# Patient Record
Sex: Female | Born: 1940 | Race: White | Hispanic: No | Marital: Single | Smoking: Former smoker
Health system: Southern US, Community
[De-identification: ages and names within clinical notes are randomized; demographics above are authoritative.]

## PROBLEM LIST (undated history)

## (undated) DIAGNOSIS — G44219 Episodic tension-type headache, not intractable: Secondary | ICD-10-CM

## (undated) DIAGNOSIS — G43909 Migraine, unspecified, not intractable, without status migrainosus: Secondary | ICD-10-CM

## (undated) DIAGNOSIS — E785 Hyperlipidemia, unspecified: Secondary | ICD-10-CM

## (undated) DIAGNOSIS — E669 Obesity, unspecified: Secondary | ICD-10-CM

## (undated) DIAGNOSIS — C4401 Basal cell carcinoma of skin of lip: Secondary | ICD-10-CM

## (undated) DIAGNOSIS — J189 Pneumonia, unspecified organism: Secondary | ICD-10-CM

## (undated) DIAGNOSIS — R011 Cardiac murmur, unspecified: Secondary | ICD-10-CM

## (undated) DIAGNOSIS — J309 Allergic rhinitis, unspecified: Secondary | ICD-10-CM

## (undated) DIAGNOSIS — J449 Chronic obstructive pulmonary disease, unspecified: Secondary | ICD-10-CM

## (undated) DIAGNOSIS — Z8489 Family history of other specified conditions: Secondary | ICD-10-CM

## (undated) DIAGNOSIS — M899 Disorder of bone, unspecified: Secondary | ICD-10-CM

## (undated) DIAGNOSIS — N189 Chronic kidney disease, unspecified: Secondary | ICD-10-CM

## (undated) DIAGNOSIS — R7303 Prediabetes: Secondary | ICD-10-CM

## (undated) DIAGNOSIS — M1990 Unspecified osteoarthritis, unspecified site: Secondary | ICD-10-CM

## (undated) DIAGNOSIS — J329 Chronic sinusitis, unspecified: Secondary | ICD-10-CM

## (undated) DIAGNOSIS — I10 Essential (primary) hypertension: Secondary | ICD-10-CM

## (undated) DIAGNOSIS — M949 Disorder of cartilage, unspecified: Secondary | ICD-10-CM

## (undated) DIAGNOSIS — D649 Anemia, unspecified: Secondary | ICD-10-CM

## (undated) DIAGNOSIS — K219 Gastro-esophageal reflux disease without esophagitis: Secondary | ICD-10-CM

## (undated) HISTORY — DX: Chronic obstructive pulmonary disease, unspecified: J44.9

## (undated) HISTORY — DX: Obesity, unspecified: E66.9

## (undated) HISTORY — PX: SKIN CANCER EXCISION: SHX779

## (undated) HISTORY — PX: BREAST CYST ASPIRATION: SHX578

## (undated) HISTORY — DX: Disorder of bone, unspecified: M89.9

## (undated) HISTORY — DX: Chronic kidney disease, unspecified: N18.9

## (undated) HISTORY — DX: Gastro-esophageal reflux disease without esophagitis: K21.9

## (undated) HISTORY — PX: EYE SURGERY: SHX253

## (undated) HISTORY — DX: Chronic sinusitis, unspecified: J32.9

## (undated) HISTORY — PX: KNEE ARTHROSCOPY: SHX127

## (undated) HISTORY — DX: Disorder of bone, unspecified: M94.9

## (undated) HISTORY — DX: Basal cell carcinoma of skin of lip: C44.01

## (undated) HISTORY — DX: Episodic tension-type headache, not intractable: G44.219

## (undated) HISTORY — DX: Hyperlipidemia, unspecified: E78.5

## (undated) HISTORY — DX: Unspecified osteoarthritis, unspecified site: M19.90

## (undated) HISTORY — DX: Essential (primary) hypertension: I10

## (undated) HISTORY — DX: Allergic rhinitis, unspecified: J30.9

## (undated) HISTORY — DX: Pneumonia, unspecified organism: J18.9

## (undated) HISTORY — DX: Anemia, unspecified: D64.9

---
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
## 1999-07-22 ENCOUNTER — Other Ambulatory Visit: Admission: RE | Admit: 1999-07-22 | Discharge: 1999-07-22 | Payer: Self-pay | Admitting: Family Medicine

## 2000-11-29 ENCOUNTER — Other Ambulatory Visit: Admission: RE | Admit: 2000-11-29 | Discharge: 2000-11-29 | Payer: Self-pay | Admitting: Family Medicine

## 2003-11-21 ENCOUNTER — Other Ambulatory Visit: Admission: RE | Admit: 2003-11-21 | Discharge: 2003-11-21 | Payer: Self-pay | Admitting: Family Medicine

## 2004-09-16 ENCOUNTER — Ambulatory Visit: Payer: Self-pay | Admitting: Family Medicine

## 2004-12-01 ENCOUNTER — Ambulatory Visit: Payer: Self-pay | Admitting: Family Medicine

## 2004-12-14 ENCOUNTER — Ambulatory Visit: Payer: Self-pay | Admitting: Family Medicine

## 2005-09-11 ENCOUNTER — Ambulatory Visit: Payer: Self-pay | Admitting: Family Medicine

## 2005-12-01 ENCOUNTER — Ambulatory Visit: Payer: Self-pay | Admitting: Family Medicine

## 2005-12-08 ENCOUNTER — Other Ambulatory Visit: Admission: RE | Admit: 2005-12-08 | Discharge: 2005-12-08 | Payer: Self-pay | Admitting: Family Medicine

## 2005-12-08 ENCOUNTER — Encounter: Payer: Self-pay | Admitting: Family Medicine

## 2005-12-08 ENCOUNTER — Ambulatory Visit: Payer: Self-pay | Admitting: Family Medicine

## 2005-12-13 LAB — CONVERTED CEMR LAB: Pap Smear: NORMAL

## 2006-01-06 ENCOUNTER — Ambulatory Visit: Payer: Self-pay | Admitting: Family Medicine

## 2006-03-24 ENCOUNTER — Ambulatory Visit: Payer: Self-pay | Admitting: Family Medicine

## 2006-08-11 ENCOUNTER — Ambulatory Visit: Payer: Self-pay | Admitting: Internal Medicine

## 2006-12-13 ENCOUNTER — Ambulatory Visit: Payer: Self-pay | Admitting: Family Medicine

## 2006-12-13 LAB — CONVERTED CEMR LAB
Chloride: 107 meq/L (ref 96–112)
Creatinine, Ser: 1 mg/dL (ref 0.4–1.2)
Direct LDL: 136.6 mg/dL
Eosinophils Absolute: 0.2 10*3/uL (ref 0.0–0.6)
Eosinophils Relative: 2.4 % (ref 0.0–5.0)
Glucose, Bld: 112 mg/dL — ABNORMAL HIGH (ref 70–99)
HCT: 38 % (ref 36.0–46.0)
Hemoglobin: 13.6 g/dL (ref 12.0–15.0)
MCV: 93.1 fL (ref 78.0–100.0)
Monocytes Absolute: 0.5 10*3/uL (ref 0.2–0.7)
Neutrophils Relative %: 67.1 % (ref 43.0–77.0)
Potassium: 4.3 meq/L (ref 3.5–5.1)
RBC: 4.09 M/uL (ref 3.87–5.11)
RDW: 12.5 % (ref 11.5–14.6)
Sodium: 145 meq/L (ref 135–145)
TSH: 1.11 microintl units/mL (ref 0.35–5.50)
Triglycerides: 116 mg/dL (ref 0–149)
WBC: 6.3 10*3/uL (ref 4.5–10.5)

## 2007-08-23 ENCOUNTER — Ambulatory Visit: Payer: Self-pay | Admitting: Family Medicine

## 2007-10-10 ENCOUNTER — Encounter: Payer: Self-pay | Admitting: Family Medicine

## 2007-12-19 ENCOUNTER — Other Ambulatory Visit: Admission: RE | Admit: 2007-12-19 | Discharge: 2007-12-19 | Payer: Self-pay | Admitting: Family Medicine

## 2007-12-19 ENCOUNTER — Ambulatory Visit: Payer: Self-pay | Admitting: Family Medicine

## 2007-12-19 ENCOUNTER — Encounter: Payer: Self-pay | Admitting: Family Medicine

## 2007-12-19 DIAGNOSIS — M129 Arthropathy, unspecified: Secondary | ICD-10-CM | POA: Insufficient documentation

## 2007-12-19 DIAGNOSIS — D649 Anemia, unspecified: Secondary | ICD-10-CM | POA: Insufficient documentation

## 2007-12-19 DIAGNOSIS — K219 Gastro-esophageal reflux disease without esophagitis: Secondary | ICD-10-CM | POA: Insufficient documentation

## 2007-12-19 DIAGNOSIS — E785 Hyperlipidemia, unspecified: Secondary | ICD-10-CM

## 2007-12-19 DIAGNOSIS — M810 Age-related osteoporosis without current pathological fracture: Secondary | ICD-10-CM | POA: Insufficient documentation

## 2007-12-19 DIAGNOSIS — M8580 Other specified disorders of bone density and structure, unspecified site: Secondary | ICD-10-CM

## 2007-12-19 LAB — CONVERTED CEMR LAB
Bilirubin Urine: NEGATIVE
Ketones, urine, test strip: NEGATIVE
pH: 5.5

## 2007-12-20 LAB — CONVERTED CEMR LAB
Alkaline Phosphatase: 66 units/L (ref 39–117)
BUN: 23 mg/dL (ref 6–23)
Basophils Relative: 0.5 % (ref 0.0–1.0)
Bilirubin, Direct: 0.1 mg/dL (ref 0.0–0.3)
CO2: 33 meq/L — ABNORMAL HIGH (ref 19–32)
Cholesterol: 240 mg/dL (ref 0–200)
GFR calc Af Amer: 57 mL/min
Hemoglobin: 13.5 g/dL (ref 12.0–15.0)
Lymphocytes Relative: 19.5 % (ref 12.0–46.0)
MCHC: 33.2 g/dL (ref 30.0–36.0)
MCV: 94.9 fL (ref 78.0–100.0)
Monocytes Absolute: 0.4 10*3/uL (ref 0.2–0.7)
Monocytes Relative: 5.3 % (ref 3.0–11.0)
Neutro Abs: 6 10*3/uL (ref 1.4–7.7)
Potassium: 3.4 meq/L — ABNORMAL LOW (ref 3.5–5.1)
TSH: 1.07 microintl units/mL (ref 0.35–5.50)
Total Protein: 7.3 g/dL (ref 6.0–8.3)
VLDL: 17 mg/dL (ref 0–40)

## 2007-12-26 ENCOUNTER — Telehealth (INDEPENDENT_AMBULATORY_CARE_PROVIDER_SITE_OTHER): Payer: Self-pay | Admitting: *Deleted

## 2007-12-27 ENCOUNTER — Encounter: Payer: Self-pay | Admitting: Family Medicine

## 2007-12-28 LAB — CONVERTED CEMR LAB: Vit D, 1,25-Dihydroxy: 34 (ref 30–89)

## 2007-12-29 ENCOUNTER — Ambulatory Visit: Payer: Self-pay | Admitting: Family Medicine

## 2008-01-02 ENCOUNTER — Encounter: Payer: Self-pay | Admitting: Family Medicine

## 2008-03-25 ENCOUNTER — Ambulatory Visit: Payer: Self-pay | Admitting: Family Medicine

## 2008-04-08 LAB — CONVERTED CEMR LAB
ALT: 31 units/L (ref 0–35)
AST: 33 units/L (ref 0–37)
Alkaline Phosphatase: 86 units/L (ref 39–117)
Bilirubin, Direct: 0.1 mg/dL (ref 0.0–0.3)
HDL: 61.8 mg/dL (ref >39.0)
Total Bilirubin: 0.5 mg/dL (ref 0.3–1.2)

## 2008-04-09 ENCOUNTER — Telehealth: Payer: Self-pay | Admitting: Family Medicine

## 2008-07-02 ENCOUNTER — Encounter: Payer: Self-pay | Admitting: Family Medicine

## 2008-07-16 ENCOUNTER — Telehealth: Payer: Self-pay | Admitting: Family Medicine

## 2008-08-21 ENCOUNTER — Ambulatory Visit: Payer: Self-pay | Admitting: Family Medicine

## 2008-10-10 ENCOUNTER — Encounter: Payer: Self-pay | Admitting: Family Medicine

## 2008-11-06 ENCOUNTER — Encounter: Payer: Self-pay | Admitting: Family Medicine

## 2008-11-06 ENCOUNTER — Telehealth: Payer: Self-pay | Admitting: Family Medicine

## 2008-12-24 ENCOUNTER — Ambulatory Visit: Payer: Self-pay | Admitting: Family Medicine

## 2008-12-24 DIAGNOSIS — G44219 Episodic tension-type headache, not intractable: Secondary | ICD-10-CM

## 2008-12-24 DIAGNOSIS — I10 Essential (primary) hypertension: Secondary | ICD-10-CM | POA: Insufficient documentation

## 2008-12-24 DIAGNOSIS — J309 Allergic rhinitis, unspecified: Secondary | ICD-10-CM | POA: Insufficient documentation

## 2008-12-27 ENCOUNTER — Ambulatory Visit: Payer: Self-pay | Admitting: Family Medicine

## 2008-12-27 LAB — CONVERTED CEMR LAB
OCCULT 1: NEGATIVE
OCCULT 2: NEGATIVE
OCCULT 3: NEGATIVE

## 2009-01-01 ENCOUNTER — Encounter: Payer: Self-pay | Admitting: Family Medicine

## 2009-05-27 ENCOUNTER — Encounter: Payer: Self-pay | Admitting: Family Medicine

## 2009-08-05 ENCOUNTER — Ambulatory Visit: Payer: Self-pay | Admitting: Family Medicine

## 2009-08-25 ENCOUNTER — Ambulatory Visit: Payer: Self-pay | Admitting: Family Medicine

## 2009-09-11 ENCOUNTER — Ambulatory Visit: Payer: Self-pay | Admitting: Family Medicine

## 2009-09-30 ENCOUNTER — Encounter: Payer: Self-pay | Admitting: Family Medicine

## 2009-10-14 ENCOUNTER — Encounter: Payer: Self-pay | Admitting: Family Medicine

## 2009-12-01 ENCOUNTER — Telehealth: Payer: Self-pay | Admitting: Family Medicine

## 2009-12-03 ENCOUNTER — Encounter: Payer: Self-pay | Admitting: Family Medicine

## 2009-12-25 ENCOUNTER — Ambulatory Visit: Payer: Self-pay | Admitting: Family Medicine

## 2009-12-25 ENCOUNTER — Other Ambulatory Visit: Admission: RE | Admit: 2009-12-25 | Discharge: 2009-12-25 | Payer: Self-pay | Admitting: Family Medicine

## 2009-12-25 DIAGNOSIS — E669 Obesity, unspecified: Secondary | ICD-10-CM

## 2009-12-25 LAB — CONVERTED CEMR LAB
Bilirubin Urine: NEGATIVE
Glucose, Urine, Semiquant: NEGATIVE
Ketones, urine, test strip: NEGATIVE
Specific Gravity, Urine: 1.02
Urobilinogen, UA: 0.2
pH: 6.5

## 2009-12-30 ENCOUNTER — Encounter: Payer: Self-pay | Admitting: Family Medicine

## 2009-12-30 LAB — CONVERTED CEMR LAB: Pap Smear: NEGATIVE

## 2010-01-01 ENCOUNTER — Ambulatory Visit: Payer: Self-pay | Admitting: Family Medicine

## 2010-01-01 LAB — CONVERTED CEMR LAB
ALT: 25 units/L (ref 0–35)
BUN: 18 mg/dL (ref 6–23)
Bilirubin, Direct: 0 mg/dL (ref 0.0–0.3)
CO2: 32 meq/L (ref 19–32)
Chloride: 109 meq/L (ref 96–112)
Cholesterol: 183 mg/dL (ref 0–200)
Creatinine, Ser: 1.1 mg/dL (ref 0.4–1.2)
Eosinophils Absolute: 0.3 10*3/uL (ref 0.0–0.7)
Eosinophils Relative: 4.9 % (ref 0.0–5.0)
Glucose, Bld: 103 mg/dL — ABNORMAL HIGH (ref 70–99)
HCT: 32.4 % — ABNORMAL LOW (ref 36.0–46.0)
Lymphs Abs: 1 10*3/uL (ref 0.7–4.0)
MCHC: 31.4 g/dL (ref 30.0–36.0)
MCV: 90.5 fL (ref 78.0–100.0)
Monocytes Absolute: 0.4 10*3/uL (ref 0.1–1.0)
Neutrophils Relative %: 68 % (ref 43.0–77.0)
Platelets: 295 10*3/uL (ref 150.0–400.0)
Potassium: 3.8 meq/L (ref 3.5–5.1)
TSH: 1.54 microintl units/mL (ref 0.35–5.50)
Total Bilirubin: 0.2 mg/dL — ABNORMAL LOW (ref 0.3–1.2)
Total Protein: 7.2 g/dL (ref 6.0–8.3)
Triglycerides: 101 mg/dL (ref 0.0–149.0)
Vit D, 25-Hydroxy: 33 ng/mL (ref 30–89)
WBC: 5.4 10*3/uL (ref 4.5–10.5)

## 2010-01-11 LAB — CONVERTED CEMR LAB: OCCULT 3: NEGATIVE

## 2010-01-13 ENCOUNTER — Encounter: Payer: Self-pay | Admitting: Family Medicine

## 2010-04-09 ENCOUNTER — Ambulatory Visit: Payer: Self-pay | Admitting: Family Medicine

## 2010-07-29 ENCOUNTER — Ambulatory Visit: Payer: Self-pay | Admitting: Family Medicine

## 2010-11-04 ENCOUNTER — Encounter: Payer: Self-pay | Admitting: Family Medicine

## 2010-12-06 LAB — CONVERTED CEMR LAB
AST: 33 units/L (ref 0–37)
Albumin: 3.7 g/dL (ref 3.5–5.2)
Alkaline Phosphatase: 69 units/L (ref 39–117)
BUN: 20 mg/dL (ref 6–23)
Bilirubin, Direct: 0.1 mg/dL (ref 0.0–0.3)
Chloride: 104 meq/L (ref 96–112)
Eosinophils Absolute: 0.3 10*3/uL (ref 0.0–0.7)
Eosinophils Relative: 4.3 % (ref 0.0–5.0)
GFR calc non Af Amer: 58 mL/min
HDL: 73.4 mg/dL (ref >39.0)
Monocytes Relative: 5.8 % (ref 3.0–12.0)
Neutrophils Relative %: 65.8 % (ref 43.0–77.0)
Nitrite: NEGATIVE
Platelets: 234 10*3/uL (ref 150–400)
Potassium: 3.5 meq/L (ref 3.5–5.1)
Protein, U semiquant: NEGATIVE
RDW: 12.6 % (ref 11.5–14.6)
Sodium: 144 meq/L (ref 135–145)
Total CHOL/HDL Ratio: 2.5
Urobilinogen, UA: 0.2
VLDL: 21 mg/dL (ref 0–40)
WBC Urine, dipstick: NEGATIVE
WBC: 5.9 10*3/uL (ref 4.5–10.5)

## 2010-12-08 NOTE — Progress Notes (Signed)
Summary: need status  Phone Note Call from Patient Call back at [facility] Phone [phone]   Summary of Call: need to know status of nexium prior auth. pleae call. Initial call taken by: Warnell Forester,  December 01, 2009 9:48 AM  Follow-up for Phone Call        have not seen request for this  Follow-up by: Pura Spice, RN,  December 02, 2009 9:36 AM  Additional Follow-up for Phone Call Additional follow up Details #1::        Doy Hutching is working on these today. Additional Follow-up by: Lynann Beaver CMA,  December 02, 2009 9:40 AM    Additional Follow-up for Phone Call Additional follow up Details #2::    Advised pt pa request was recieved and sent back to insurance company, pending approval Follow-up by: Trixie Dredge,  December 03, 2009 8:12 AM

## 2010-12-08 NOTE — Letter (Signed)
Summary: Generic Letter  [place] at [facility]  [address] [place], [place] [address]   Phone: [phone]  Fax: [phone]    01/13/2010  Joy Martin [address] [place], [place]  [address]  Dear Ms. Leisure,    Hemocult cards were all negative.        Sincerely,   Dr.WR Stafford,MD

## 2010-12-08 NOTE — Assessment & Plan Note (Signed)
Summary: EMP-WILL FAST//CCM   Vital Signs:  Patient profile:   70 year old female Height:      61 inches Weight:      201 pounds BMI:     38.12 O2 Sat:      95 % on Room air Temp:     98.2 degrees F oral Pulse rate:   59 / minute BP sitting:   162 / 82  (left arm) Cuff size:   large  Vitals Entered By: Pura Spice, RN (December 25, 2009 10:19 AM)  O2 Flow:  Room air CC: go over problems refill meds and fasting for labs  Is Patient Diabetic? No   History of Present Illness: This 70 year old white female is into discuss her multiple problems i as well as refill her medications she came fasting for possible lab studies. Injections of TD Pneumovax and Zostavax are up-to-date. 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 day as well as progesterone since patient has a uterus In general the patient relates she had been doing for well, continues to have problems losing weight No other new problems she mentioned continues to have tension headaches that are well-controlled with medication  Allergies: 1)  ! Allegra-D 12 Hour (Fexofenadine-Pseudoephedrine)  Past History:  Past Medical History: Last updated: 08/25/2009 Unremarkable  Past Surgical History: Last updated: 06/22/2007 Colonoscopy-12/12/2001  Social History: Last updated: 06/22/2007 Retired Former Smoker Alcohol use-yes Drug use-no Regular exercise-yes  Risk Factors: Smoking Status: quit (06/22/2007)  Review of Systems  The patient denies anorexia, fever, weight loss, weight  gain, vision loss, decreased hearing, hoarseness, chest pain, syncope, dyspnea on exertion, peripheral edema, prolonged cough, headaches, hemoptysis, abdominal pain, melena, hematochezia, severe indigestion/heartburn, hematuria, incontinence, genital sores, muscle weakness, suspicious skin lesions, transient blindness, difficulty walking, depression, unusual weight change, abnormal bleeding, enlarged lymph nodes, angioedema, breast masses, and testicular masses.    Physical Exam  General:  Well-developed,well-nourished,in no acute distress; alert,appropriate and cooperative throughout examinationoverweight-appearing.   Head:  Normocephalic and atraumatic without obvious abnormalities. No apparent alopecia or balding. Eyes:  No corneal or conjunctival inflammation noted. EOMI. Perrla. Funduscopic exam benign, without hemorrhages, exudates or papilledema. Vision grossly normal. Ears:  External ear exam shows no significant lesions or deformities.  Otoscopic examination reveals clear canals, tympanic membranes are intact bilaterally without bulging, retraction, inflammation or discharge. Hearing is grossly normal bilaterally. Nose:  boggy nasal mucosa slightly edematous Mouth:  Oral mucosa and oropharynx without lesions or exudates.  Teeth in good repair. Neck:  No deformities, masses, or tenderness noted. Chest Wall:  No deformities, masses, or tenderness noted. Breasts:  No mass, nodules, thickening, tenderness, bulging, retraction, inflamation, nipple discharge or skin changes noted.   Lungs:  Normal respiratory effort, chest expands symmetrically. Lungs are clear to auscultation, no crackles or wheezes. Heart:  Normal rate and regular rhythm. S1 and S2 normal without gallop, murmur, click, rub or other extra sounds. Abdomen:  Bowel sounds positive,abdomen soft and non-tender without masses, organomegaly or hernias noted. Rectal:  not examined Genitalia:  not examinedpelvic and Pap last year Msk:   arthritic joints  of fingers no loss no obvious abnormalities Pulses:  R and L carotid,radial,femoral,dorsalis pedis and posterior tibial pulses are full and equal bilaterally Extremities:  No clubbing, cyanosis, edema, or deformity noted with normal full range of motion of all joints.   Neurologic:  No cranial nerve deficits noted. Station and gait are normal. Plantar reflexes are down-going bilaterally. DTRs are symmetrical throughout. Sensory, motor and coordinative functions appear intact. Skin:  Intact without suspicious lesions or rashes Cervical Nodes:  No lymphadenopathy noted Axillary Nodes:  No palpable lymphadenopathy Inguinal Nodes:  No significant adenopathy Psych:  Cognition and judgment appear intact. Alert and cooperative with normal attention span and concentration. 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 1 by mouth two times a day    Diovan Hct 160-12.5 Mg Tabs (Valsartan-hydrochlorothiazide) ..... Once daily  Orders: Prescription Created Electronically [phone])  Problem # 3:  ALLERGIC RHINITIS, CHRONIC (ICD-477.9) Assessment: Unchanged  Her updated medication list for this problem includes:    Promethazine Hcl 25 Mg Tabs (Promethazine hcl) .Marland Kitchen... 1 by mouth every 4-6 hrs as needed nausea or vomitng    Fluticasone Propionate 50 Mcg/act Susp (Fluticasone propionate) ..... Use two sprays each nostril once daily  Problem # 4:  GERD (ICD-530.81) Assessment: Improved  Her updated medication list for this problem includes:    Nexium 40 Mg Cpdr (Esomeprazole magnesium) .Marland Kitchen... 1 once daily for gerd  Problem # 5:  ARTHRITIS (ICD-716.90) Assessment: Unchanged TC diclofenac and start  nabumetone 750 mg b.i.d.  Problem # 6:  HYPERLIPIDEMIA (ICD-272.4) Assessment: Unchanged  Her updated medication list for this problem includes:    Simvastatin 40 Mg Tabs (Simvastatin) ..... Once daily pm  Orders: Venipuncture (63016) TLB-Lipid Panel (80061-LIPID) TLB-Hepatic/Liver Function Pnl (80076-HEPATIC)  Problem # 7:  HYPERLIPIDEMIA (ICD-272.4)  Her updated medication list for this problem includes:    Simvastatin 40 Mg Tabs (Simvastatin) ..... Once daily pm  Orders: Venipuncture (01093) TLB-Lipid Panel (80061-LIPID) TLB-Hepatic/Liver Function Pnl (80076-HEPATIC)  Problem # 8:  EXOGENOUS OBESITY (ICD-278.00) Assessment: Unchanged  Complete Medication List: 1)  Ascomp-codeine 50-325-40-30 Mg Caps (Butalbital-asa-caff-codeine) .... 0ne q4h as needed headache  not to exceed 4 per day, fill on my 3rda 2)  Medroxyprogesterone Acetate 2.5 Mg Tabs (Medroxyprogesterone acetate) .Marland Kitchen.. 1 by mouth once daily 3)  Nexium 40 Mg Cpdr (Esomeprazole magnesium) .Marland Kitchen.. 1 once daily for gerd 4)  Nadolol 80 Mg Tabs (Nadolol) .Marland Kitchen.. 1 by mouth two times a day 5)  Diovan Hct 160-12.5 Mg Tabs (Valsartan-hydrochlorothiazide) .... Once daily 6)  Estrace 0.5 Mg Tabs (Estradiol) .Marland Kitchen.. 1 by mouth once daily 7)  Diclofenac Sodium 75 Mg Tbec (Diclofenac sodium) .Marland Kitchen.. 1 two times a day after meals for arthritis 8)  Simvastatin 40 Mg Tabs (Simvastatin) .... Once daily pm 9)  Promethazine Hcl 25 Mg Tabs (Promethazine hcl) .Marland Kitchen.. 1 by mouth every 4-6 hrs as needed nausea or vomitng 10)  Fluticasone Propionate 50 Mcg/act Susp (Fluticasone propionate) .... Use two sprays each nostril once daily 11)  Nabumetone 750 Mg Tabs (Nabumetone) .Marland Kitchen.. 1 two times a day pc for arthritis 12)  Tandem Plus 162-115.2-1 Mg Caps (Fefum-fepo-fa-b cmp-c-zn-mn-cu) .Marland Kitchen.. 1 by mouth once daily  Other Orders: T-Vitamin D (25-Hydroxy) (23557-32202) UA Dipstick w/o Micro (automated)  (81003) TLB-BMP (  Basic Metabolic Panel-BMET)  (80048-METABOL) TLB-CBC Platelet - w/Differential (85025-CBCD) TLB-TSH (Thyroid Stimulating Hormone) (84443-TSH)  Patient Instructions: 1)  medical problem for her to well-controlled so continue same medications as recommended 2)  2 discontinue diclofenac and tried in the BB tones with arthritis 3)  You need to lose weight. Consider a lower calorie diet and regular exercise.  4)  we'll call lab results when returned Prescriptions: TANDEM PLUS 162-115.2-1 MG CAPS (FEFUM-FEPO-FA-B CMP-C-ZN-MN-CU) 1 by mouth once daily  #30 x 3   Entered by:   Pura Spice, RN   Authorized by:   Judithann Sheen MD   Signed by:   Pura Spice, RN on 01/01/2010   Method used:   Electronically to        [facility]  [phone]* (retail)       [address] [place], [place]  [address]       Ph: [phone] or [phone]       Fax: [phone]   RxID:   [id number] NABUMETONE 750 MG TABS (NABUMETONE) 1 two times a day PC FOR ARTHRITIS  #60 x 11   Entered and Authorized by:   Judithann Sheen MD   Signed by:   Judithann Sheen MD on 12/25/2009   Method used:   Electronically to        [facility]  [phone]* (retail)       [address] [place], [place]  [address]       Ph: [phone] or [phone]       Fax: [phone]   RxID:   [id number] FLUTICASONE PROPIONATE 50 MCG/ACT SUSP (FLUTICASONE PROPIONATE) use two sprays each nostril once daily  #1 x 11   Entered and Authorized by:   Judithann Sheen MD   Signed by:   Judithann Sheen MD on 12/25/2009   Method used:   Electronically to        [facility]  [phone]* (retail)       [address] [place], [place]  [address]       Ph: [phone] or [phone]       Fax: [phone]   RxID:   [id number] SIMVASTATIN 40 MG  TABS (SIMVASTATIN) once daily pm  #30 x 11   Entered and Authorized by:   Judithann Sheen MD   Signed by:   Judithann Sheen MD on 12/25/2009   Method  used:   Electronically to        [facility]  [phone]* (retail)       [address] [place], [place]  [address]       Ph: [phone] or [phone]       Fax: [phone]   RxID:   [id number] ESTRACE 0.5 MG  TABS (ESTRADIOL) 1 by mouth once daily  #30 x 11   Entered and Authorized by:   Judithann Sheen MD   Signed by:   Judithann Sheen MD on 12/25/2009   Method used:   Electronically to        [facility]  [phone]* (retail)       [address] [place], [place]  [address]       Ph: [phone] or [phone]  Fax: [phone]   RxID:   [id number] DIOVAN HCT 160-12.5 MG  TABS (VALSARTAN-HYDROCHLOROTHIAZIDE) once daily  #30 x 11   Entered and Authorized by:   Judithann Sheen MD   Signed by:   Judithann Sheen MD on 12/25/2009   Method used:   Electronically to        [facility]  [phone]* (retail)       [address] [place], [place]  [address]       Ph: [phone] or [phone]       Fax: [phone]   RxID:   [id number] NADOLOL 80 MG  TABS (NADOLOL) 1 by mouth two times a day  #60 x 11   Entered and Authorized by:   Judithann Sheen MD   Signed by:   Judithann Sheen MD on 12/25/2009   Method used:   Electronically to        [facility]  [phone]* (retail)       [address] [place], [place]  [address]       Ph: [phone] or [phone]       Fax: [phone]   RxID:   [id number] NEXIUM 40 MG CPDR (ESOMEPRAZOLE MAGNESIUM) 1 once daily FOR GERD  #30 x 11   Entered and Authorized by:   Judithann Sheen MD   Signed by:   Judithann Sheen MD on 12/25/2009   Method used:   Electronically to        [facility]  [phone]* (retail)       [address] [place], [place]  [address]       Ph: [phone] or [phone]       Fax: [phone]   RxID:   [id number] MEDROXYPROGESTERONE ACETATE 2.5 MG  TABS (MEDROXYPROGESTERONE ACETATE) 1 by mouth  once daily  #30 x 11   Entered and Authorized by:   Judithann Sheen MD   Signed by:   Judithann Sheen MD on 12/25/2009   Method used:   Electronically to        [facility]  [phone]* (retail)       [address] [place], [place]  [address]       Ph: [phone] or [phone]       Fax: [phone]   RxID:   [id number] ASCOMP-CODEINE 50-325-40-30 MG  CAPS (BUTALBITAL-ASA-CAFF-CODEINE) 0ne q4h as needed headache  not to exceed 4 per day, FILL ON mY 3RDA  #100 x 5   Entered and Authorized by:   Judithann Sheen MD   Signed by:   Judithann Sheen MD on 12/25/2009   Method used:   Print then Give to Patient   RxID:   [id number]    Immunization History:  Zostavax History:    Zostavax # 1:  zostavax (03/24/2006)    Zostavax # 1:  zostavax (03/24/2006)    Zostavax # 1:  given (03/24/2006)     Laboratory Results   Urine Tests    Routine Urinalysis   Color: yellow Appearance: Clear Glucose: negative   (Normal Range: Negative) Bilirubin: negative   (Normal Range: Negative) Ketone: negative   (Normal Range: Negative) Spec. Gravity: 1.020   (Normal Range: 1.003-1.035) Blood: negative   (Normal Range: Negative) pH: 6.5   (  Normal Range: 5.0-8.0) Protein: negative   (Normal Range: Negative) Urobilinogen: 0.2   (Normal Range: 0-1) Nitrite: negative   (Normal Range: Negative) Leukocyte Esterace: trace   (Normal Range: Negative)    Comments: Rita Ohara  December 25, 2009 12:35 PM

## 2010-12-08 NOTE — Medication Information (Signed)
Summary: Coverage Approval for Nexium/[employer]  Coverage Approval for Nexium/[employer]   Imported By: Maryln Gottron 12/09/2009 12:40:03  _____________________________________________________________________  External Attachment:    Type:   Image     Comment:   External Document

## 2010-12-08 NOTE — Assessment & Plan Note (Signed)
Summary: flu shot/cjr  Nurse Visit   Vitals Entered By: Duard Brady LPN (July 29, 2010 3:18 PM)  Allergies: 1)  ! Allegra-D 12 Hour  Orders Added: 1)  Flu Vaccine 20yrs + MEDICARE PATIENTS [Q2039] 2)  Administration Flu vaccine - MCR [G0008] Flu Vaccine Consent Questions     Do you have a history of severe allergic reactions to this vaccine? no    Any prior history of allergic reactions to egg and/or gelatin? no    Do you have a sensitivity to the preservative Thimersol? no    Do you have a past history of Guillan-Barre Syndrome? no    Do you currently have an acute febrile illness? no    Have you ever had a severe reaction to latex? no    Vaccine information given and explained to patient? yes    Are you currently pregnant? no    Lot Number:[id number]   Exp Date:05/08/2011   Site Given  Left Deltoid IM.lbmedflu  Appended Document: flu shot/cjr done

## 2010-12-08 NOTE — Letter (Signed)
Summary: Results Follow-up Letter  [place] at [facility]  [address] [place], [place] [address]   Phone: [phone]  Fax: [phone]    12/30/2009  [address] [place], [place]  [address]  Dear Ms. Wiederhold,   The following are the results of your recent test(s):  Test     Result     Pap Smear    Normal__yes _____    _________________________________________________________ __________________________  Sincerely,  Dr Vevelyn Royals at [facility]

## 2010-12-31 ENCOUNTER — Other Ambulatory Visit: Payer: Self-pay | Admitting: Family Medicine

## 2011-02-02 ENCOUNTER — Encounter: Payer: Self-pay | Admitting: Family Medicine

## 2011-02-03 ENCOUNTER — Other Ambulatory Visit: Payer: Self-pay | Admitting: Family Medicine

## 2011-02-15 ENCOUNTER — Other Ambulatory Visit: Payer: Self-pay | Admitting: Family Medicine

## 2011-03-25 ENCOUNTER — Encounter: Payer: Self-pay | Admitting: Family Medicine

## 2011-03-25 ENCOUNTER — Ambulatory Visit (INDEPENDENT_AMBULATORY_CARE_PROVIDER_SITE_OTHER): Payer: Medicare Other | Admitting: Family Medicine

## 2011-03-25 ENCOUNTER — Other Ambulatory Visit (HOSPITAL_COMMUNITY)
Admission: RE | Admit: 2011-03-25 | Discharge: 2011-03-25 | Disposition: A | Discharge status: Home / Self Care | Payer: Medicare Other | Source: Ambulatory Visit | Attending: Family Medicine | Admitting: Family Medicine

## 2011-03-25 VITALS — BP 118/74 | HR 59 | Temp 98.7°F | Resp 16 | Ht 61.5 in | Wt 200.0 lb

## 2011-03-25 DIAGNOSIS — I10 Essential (primary) hypertension: Secondary | ICD-10-CM

## 2011-03-25 DIAGNOSIS — Z780 Asymptomatic menopausal state: Secondary | ICD-10-CM

## 2011-03-25 DIAGNOSIS — E039 Hypothyroidism, unspecified: Secondary | ICD-10-CM

## 2011-03-25 DIAGNOSIS — Z0000 Encounter for general adult medical examination without abnormal findings: Secondary | ICD-10-CM

## 2011-03-25 DIAGNOSIS — E559 Vitamin D deficiency, unspecified: Secondary | ICD-10-CM

## 2011-03-25 DIAGNOSIS — E669 Obesity, unspecified: Secondary | ICD-10-CM

## 2011-03-25 DIAGNOSIS — M47812 Spondylosis without myelopathy or radiculopathy, cervical region: Secondary | ICD-10-CM

## 2011-03-25 DIAGNOSIS — M1711 Unilateral primary osteoarthritis, right knee: Secondary | ICD-10-CM

## 2011-03-25 DIAGNOSIS — E6609 Other obesity due to excess calories: Secondary | ICD-10-CM

## 2011-03-25 DIAGNOSIS — D649 Anemia, unspecified: Secondary | ICD-10-CM

## 2011-03-25 DIAGNOSIS — K219 Gastro-esophageal reflux disease without esophagitis: Secondary | ICD-10-CM

## 2011-03-25 DIAGNOSIS — E785 Hyperlipidemia, unspecified: Secondary | ICD-10-CM

## 2011-03-25 DIAGNOSIS — R0602 Shortness of breath: Secondary | ICD-10-CM

## 2011-03-25 DIAGNOSIS — R3915 Urgency of urination: Secondary | ICD-10-CM

## 2011-03-25 DIAGNOSIS — Z01419 Encounter for gynecological examination (general) (routine) without abnormal findings: Secondary | ICD-10-CM | POA: Insufficient documentation

## 2011-03-25 LAB — CBC WITH DIFFERENTIAL/PLATELET
Basophils Relative: 0.7 % (ref 0.0–3.0)
Eosinophils Relative: 7.9 % — ABNORMAL HIGH (ref 0.0–5.0)
HCT: 36.4 % (ref 36.0–46.0)
Hemoglobin: 12.3 g/dL (ref 12.0–15.0)
Lymphs Abs: 1.3 10*3/uL (ref 0.7–4.0)
Monocytes Relative: 6.4 % (ref 3.0–12.0)
Neutro Abs: 3.7 10*3/uL (ref 1.4–7.7)
RBC: 3.79 Mil/uL — ABNORMAL LOW (ref 3.87–5.11)
WBC: 5.9 10*3/uL (ref 4.5–10.5)

## 2011-03-25 LAB — POCT URINALYSIS DIPSTICK
Glucose, UA: NEGATIVE
Nitrite, UA: NEGATIVE
Urobilinogen, UA: 0.2

## 2011-03-25 LAB — LIPID PANEL
Cholesterol: 175 mg/dL (ref 0–200)
LDL Cholesterol: 88 mg/dL (ref 0–99)
Total CHOL/HDL Ratio: 2
VLDL: 16 mg/dL (ref 0.0–40.0)

## 2011-03-25 LAB — BASIC METABOLIC PANEL
Calcium: 10.5 mg/dL (ref 8.4–10.5)
GFR: 42.41 mL/min — ABNORMAL LOW (ref >60.00)
Glucose, Bld: 83 mg/dL (ref 70–99)
Sodium: 145 mEq/L (ref 135–145)

## 2011-03-25 LAB — HEPATIC FUNCTION PANEL
Albumin: 3.4 g/dL — ABNORMAL LOW (ref 3.5–5.2)
Bilirubin, Direct: 0 mg/dL (ref 0.0–0.3)
Total Protein: 6.6 g/dL (ref 6.0–8.3)

## 2011-03-25 MED ORDER — SIMVASTATIN 40 MG PO TABS: 40.0000 mg | ORAL_TABLET | ORAL | Status: DC

## 2011-03-25 MED ORDER — VALSARTAN-HYDROCHLOROTHIAZIDE 160-12.5 MG PO TABS: 1.0000 | ORAL_TABLET | Freq: Every day | ORAL | Status: DC

## 2011-03-25 MED ORDER — BUTALBITAL-ASA-CAFF-CODEINE 50-325-40-30 MG PO CAPS: 2.0000 | ORAL_CAPSULE | ORAL | Status: DC | PRN

## 2011-03-25 MED ORDER — FLUTICASONE PROPIONATE 50 MCG/ACT NA SUSP: 2.0000 | Freq: Every day | NASAL | Status: DC

## 2011-03-25 MED ORDER — NADOLOL 80 MG PO TABS: 80.0000 mg | ORAL_TABLET | Freq: Every day | ORAL | Status: DC

## 2011-03-25 MED ORDER — ESOMEPRAZOLE MAGNESIUM 40 MG PO CPDR: DELAYED_RELEASE_CAPSULE | ORAL | Status: DC

## 2011-03-25 NOTE — Patient Instructions (Addendum)
In general I feel you doing her well her blood pressure is well controlled secure GERD is not under control with one Nexium per day I recommend that we increase Nexium 40 mg to twice daily We'll call results of lab studies Continue your other medications as prescribed Please add to her headaches her last but continued taking the you are now with codeine when needed Will notify results of Pap smear Recommend weight reduction

## 2011-03-25 NOTE — Progress Notes (Signed)
  Subjective:    Patient ID: Joy Martin, female    DOB: [birth year], 70 y.o.   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 to discuss her medical problems be examined as well as have her laboratory studies and refill are necessary medicationsShe continues to have problems with GERD taking Nexium 40 mg each day continues to have arthritis especially medicine considerable stiffness of the right knee and pain after seeing but then after moving she is much better she had arthroscopic surgery on her right knee several years agoFrequency of her headaches have decreased but continues to be relieved with her through our health cuttingHypertension in good control on Diovan and Corgard2 prevent postmenopausal syndrome she continues on Estrace and progesteroneIn regard to her arthritis she continues to have some pain over the cervical spineWilma left side and occasionally has radiation of pain into the left shoulder immunizations are up-to-date colonoscopic exam up-to-dateHPI    Review of Systemssee history of present illness     Objective:   Physical ExamThe patient is a well-developed well-nourished Overweight white female who is in no distress HEENT negative carotid pulses good thyroid normal Neck examination revealed tenderness over the cervical spine on the left C4-C7 Breast: No masses no tenderness normal nipples axilla clear no lymphadenopathy breast are full Lungs clear to palpation percussion with an auscultation no rales no dullness no wheezing Heart no cardiomegaly heart sounds without murmurs peripheral pulses are good and equal regular rhythm Abdomen liver spleen and kidneys are nonpalpable no masses felt bowel sounds normal Pelvic exam reveals a normal introitus porus vaginal mucosa clear.surgnc  Uterus small adnexal areas negative Pap smear done Rectal examination negative revealing no abnormalities Extremities right knee is slightly swollen more than left  tenderness over the medial and lateral aspect off the right knee Scan 3 cm scar over the posterior chest wall for removal of benign hemangioma Neurological examination completely negative          Assessment & Plan:  Hypertension well controlled continue same medications Arthritis to control with Relafen 750 mg b.i.d. 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 As well as Provera 2.5 mg q. Day Tension headaches controlled with Fiorinal with codein GERD increased Nexium 40 mg b.i.d. Cervical arthritis continue with anti-inflammatory medications Exogenous obesity recommend weight loss

## 2011-03-26 LAB — VITAMIN D 25 HYDROXY (VIT D DEFICIENCY, FRACTURES): Vit D, 25-Hydroxy: 50 ng/mL (ref 30–89)

## 2011-04-06 NOTE — Progress Notes (Signed)
Quick Note:  Pt is aware. ______ 

## 2011-05-06 ENCOUNTER — Other Ambulatory Visit (INDEPENDENT_AMBULATORY_CARE_PROVIDER_SITE_OTHER): Payer: Medicare Other

## 2011-05-06 DIAGNOSIS — N289 Disorder of kidney and ureter, unspecified: Secondary | ICD-10-CM

## 2011-05-06 DIAGNOSIS — R399 Unspecified symptoms and signs involving the genitourinary system: Secondary | ICD-10-CM

## 2011-05-06 LAB — BASIC METABOLIC PANEL
CO2: 30 mEq/L (ref 19–32)
Calcium: 10.1 mg/dL (ref 8.4–10.5)
Chloride: 100 mEq/L (ref 96–112)
Glucose, Bld: 74 mg/dL (ref 70–99)
Sodium: 138 mEq/L (ref 135–145)

## 2011-05-10 ENCOUNTER — Telehealth: Payer: Self-pay

## 2011-05-10 NOTE — Telephone Encounter (Signed)
Called and left a message for pt to return call about lab results

## 2011-05-13 ENCOUNTER — Telehealth: Payer: Self-pay

## 2011-05-13 NOTE — Telephone Encounter (Signed)
Pt is aware of lab results and will call back to make an appointment in 6 months.

## 2011-08-11 ENCOUNTER — Other Ambulatory Visit: Payer: Self-pay | Admitting: Family Medicine

## 2011-08-13 ENCOUNTER — Ambulatory Visit (INDEPENDENT_AMBULATORY_CARE_PROVIDER_SITE_OTHER): Payer: Medicare Other

## 2011-08-13 DIAGNOSIS — Z23 Encounter for immunization: Secondary | ICD-10-CM

## 2011-09-21 ENCOUNTER — Telehealth: Payer: Self-pay | Admitting: *Deleted

## 2011-09-21 NOTE — Telephone Encounter (Signed)
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 Felicity Coyer.  Declined to scheduled appt at this time. PCP banner updated to reflect [place].

## 2011-10-06 ENCOUNTER — Other Ambulatory Visit: Payer: Self-pay | Admitting: Family Medicine

## 2011-11-10 ENCOUNTER — Encounter: Payer: Self-pay | Admitting: Internal Medicine

## 2011-11-12 ENCOUNTER — Encounter: Payer: Self-pay | Admitting: Internal Medicine

## 2011-12-01 ENCOUNTER — Encounter: Payer: Self-pay | Admitting: Internal Medicine

## 2012-01-04 ENCOUNTER — Ambulatory Visit (INDEPENDENT_AMBULATORY_CARE_PROVIDER_SITE_OTHER): Payer: Medicare Other | Admitting: Internal Medicine

## 2012-01-04 ENCOUNTER — Other Ambulatory Visit (INDEPENDENT_AMBULATORY_CARE_PROVIDER_SITE_OTHER): Payer: Medicare Other

## 2012-01-04 ENCOUNTER — Encounter: Payer: Self-pay | Admitting: Internal Medicine

## 2012-01-04 DIAGNOSIS — E785 Hyperlipidemia, unspecified: Secondary | ICD-10-CM

## 2012-01-04 DIAGNOSIS — M949 Disorder of cartilage, unspecified: Secondary | ICD-10-CM

## 2012-01-04 DIAGNOSIS — I10 Essential (primary) hypertension: Secondary | ICD-10-CM

## 2012-01-04 LAB — BASIC METABOLIC PANEL
Calcium: 10.5 mg/dL (ref 8.4–10.5)
GFR: 44.67 mL/min — ABNORMAL LOW (ref >60.00)
Potassium: 5.4 mEq/L — ABNORMAL HIGH (ref 3.5–5.1)
Sodium: 143 mEq/L (ref 135–145)

## 2012-01-04 MED ORDER — NABUMETONE 750 MG PO TABS: 750.0000 mg | ORAL_TABLET | Freq: Two times a day (BID) | ORAL | Status: DC | PRN

## 2012-01-04 MED ORDER — SIMVASTATIN 40 MG PO TABS: 40.0000 mg | ORAL_TABLET | Freq: Every day | ORAL | Status: DC

## 2012-01-04 MED ORDER — ESOMEPRAZOLE MAGNESIUM 40 MG PO CPDR: 40.0000 mg | DELAYED_RELEASE_CAPSULE | Freq: Two times a day (BID) | ORAL | Status: DC

## 2012-01-04 MED ORDER — BUTALBITAL-ASA-CAFF-CODEINE 50-325-40-30 MG PO CAPS: 1.0000 | ORAL_CAPSULE | ORAL | Status: DC | PRN

## 2012-01-04 MED ORDER — FLUTICASONE PROPIONATE 50 MCG/ACT NA SUSP: 1.0000 | Freq: Every day | NASAL | Status: DC

## 2012-01-04 NOTE — Assessment & Plan Note (Signed)
BP Readings from Last 3 Encounters:  01/04/12 128/72  03/25/11 118/74  12/25/09 162/82   The current medical regimen is effective;  continue present plan and medications.  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

## 2012-01-04 NOTE — Patient Instructions (Signed)
It was good to see you today. We have reviewed your prior records including labs and tests today Test(s) ordered today. Your results will be called to you after review (48-72hours after test completion). If any changes need to be made, you will be notified at that time. Medications reviewed, no changes at this time. Refill on medication(s) as discussed today. we'll make referral for bone density at [facility] . Our office will contact you regarding appointment(s) once made. Will plan for future evaluation by gynecologist to address your hormones and Pap smear Please schedule followup in 3-4 months (June 2013) for "physical" wellness visit and labs, call sooner if problems.

## 2012-01-04 NOTE — Assessment & Plan Note (Signed)
On statin - check annually Last lipids reviewed 

## 2012-01-04 NOTE — Assessment & Plan Note (Signed)
On calcium and Estrace -  Due for follow up DEXA - will refer now - pt requests SOlis  

## 2012-01-04 NOTE — Progress Notes (Signed)
Subjective:    Patient ID: Joy Martin, female    DOB: [birth year], 71 y.o.   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 Denies adverse side effects.  Dyslipidemia - on statin for years - the patient reports compliance with medication(s) as prescribed. Denies adverse side effects.  GERD - on PPI bid since summer 2012  Past Medical History  Diagnosis Date  . Arthritis   . ALLERGIC RHINITIS, CHRONIC   . ANEMIA   . Episodic tension type headache   . Hypertension   . EXOGENOUS OBESITY   . GERD   . HYPERLIPIDEMIA   . OSTEOPENIA    Family History  Problem Relation Age of Onset  . Prostate cancer Father   . Alcohol abuse Other   . Heart disease Other   . Hypertension Other   . Diabetes Other   . Pancreatic cancer Other    History  Substance Use Topics  . Smoking status: Former Games developer  . Smokeless tobacco: Never Used  . Alcohol Use: Yes     occ    Review of Systems Constitutional: Negative for fever or weight change.  Respiratory: Negative for cough and shortness of breath.   Cardiovascular: Negative for chest pain or palpitations.  Gastrointestinal: Negative for abdominal pain, no bowel changes.  Musculoskeletal: Negative for gait problem or joint swelling.  Skin: Negative for rash.  Neurological: Negative for dizziness or headache.  No other specific complaints in a complete review of systems (except as listed in HPI above).     Objective:   Physical Exam BP 128/72  Pulse 57  Temp(Src) 98.2 F (36.8 C) (Oral)  Ht 5\' 2"  (1.575 m)  Wt 201 lb 1.9 oz (91.227 kg)  BMI 36.79 kg/m2  SpO2 93% Wt Readings from Last 3 Encounters:  01/04/12 201 lb 1.9 oz (91.227 kg)  03/25/11 200 lb (90.719 kg)  12/25/09 201 lb (91.173 kg)   Constitutional: She is  overweight, but appears well-developed and well-nourished. No distress.  HENT: Head: Normocephalic and atraumatic. Ears: B TMs ok, no erythema or effusion; Nose: Nose normal. Mouth/Throat: Oropharynx is clear and moist. No oropharyngeal exudate.  Eyes: Conjunctivae and EOM are normal. Pupils are equal, round, and reactive to light. No scleral icterus.  Neck: Thick, Normal range of motion. Neck supple. No JVD present. No thyromegaly present.  Cardiovascular: Normal rate, regular rhythm and normal heart sounds.  No murmur heard. No BLE edema. Pulmonary/Chest: Effort normal and breath sounds normal. No respiratory distress. She has no wheezes.  Musculoskeletal: Normal range of motion, no joint effusions. No gross deformities Neurological: She is alert and oriented to person, place, and time. No cranial nerve deficit. Coordination normal.  Skin: Skin is warm and dry. No rash noted. No erythema.  Psychiatric: She has a normal mood and affect. Her behavior is normal. Judgment and thought content normal.   Lab Results  Component Value Date   WBC 5.9 03/25/2011   HGB 12.3 03/25/2011   HCT 36.4 03/25/2011   PLT 245.0 03/25/2011   GLUCOSE 74 05/06/2011   CHOL 175 03/25/2011   TRIG 80.0 03/25/2011   HDL 71.40 03/25/2011   LDLDIRECT 143.2 12/19/2007   LDLCALC 88 03/25/2011   ALT 22 03/25/2011   AST 28 03/25/2011   NA 138 05/06/2011   K 4.4  05/06/2011   CL 100 05/06/2011   CREATININE 1.2 05/06/2011   BUN 23 05/06/2011   CO2 30 05/06/2011   TSH 1.79 03/25/2011   HGBA1C 6.0 12/13/2006       Assessment & Plan:  See problem list. Medications and labs reviewed today.  Time spent with pt today 35 minutes, greater than 50% time spent counseling patient on hypertension, cholesterol and medication review. Also review of prior records

## 2012-02-16 ENCOUNTER — Telehealth: Payer: Self-pay | Admitting: *Deleted

## 2012-02-16 NOTE — Telephone Encounter (Signed)
Received fax pt needing PA on nexium. Called insurance spoke with rep faxing over form. Case ID # [id number].... 02/16/12@4 :13pm/LMB

## 2012-02-17 NOTE — Telephone Encounter (Signed)
Received Pa med has been approved. Notified pharmacy spoke with Revonda Standard gave approval status... 02/17/12@4 :39pm/LMB

## 2012-02-17 NOTE — Telephone Encounter (Signed)
Received PA fill out questionaire faxed PA back to insurance comp awaiting on approval status... 02/17/12@3 :00pm/LMB

## 2012-02-24 ENCOUNTER — Other Ambulatory Visit: Payer: Self-pay | Admitting: *Deleted

## 2012-02-24 MED ORDER — NADOLOL 80 MG PO TABS: 80.0000 mg | ORAL_TABLET | Freq: Two times a day (BID) | ORAL | Status: DC

## 2012-04-02 ENCOUNTER — Other Ambulatory Visit: Payer: Self-pay | Admitting: Family Medicine

## 2012-04-04 ENCOUNTER — Other Ambulatory Visit: Payer: Self-pay | Admitting: *Deleted

## 2012-04-04 MED ORDER — BUTALBITAL-ASA-CAFF-CODEINE 50-325-40-30 MG PO CAPS: 1.0000 | ORAL_CAPSULE | ORAL | Status: DC | PRN

## 2012-04-04 NOTE — Telephone Encounter (Signed)
[facility]... 04/04/12@12 :03pm/LMB

## 2012-05-02 ENCOUNTER — Other Ambulatory Visit: Payer: Self-pay | Admitting: *Deleted

## 2012-05-02 MED ORDER — NABUMETONE 750 MG PO TABS: 750.0000 mg | ORAL_TABLET | Freq: Two times a day (BID) | ORAL | Status: DC | PRN

## 2012-05-03 ENCOUNTER — Other Ambulatory Visit (INDEPENDENT_AMBULATORY_CARE_PROVIDER_SITE_OTHER): Payer: Medicare Other

## 2012-05-03 ENCOUNTER — Encounter: Payer: Self-pay | Admitting: Internal Medicine

## 2012-05-03 ENCOUNTER — Ambulatory Visit (INDEPENDENT_AMBULATORY_CARE_PROVIDER_SITE_OTHER): Payer: Medicare Other | Admitting: Internal Medicine

## 2012-05-03 VITALS — BP 142/84 | HR 55 | Temp 98.4°F | Ht 62.0 in | Wt 198.8 lb

## 2012-05-03 DIAGNOSIS — E785 Hyperlipidemia, unspecified: Secondary | ICD-10-CM

## 2012-05-03 DIAGNOSIS — Z136 Encounter for screening for cardiovascular disorders: Secondary | ICD-10-CM

## 2012-05-03 DIAGNOSIS — M949 Disorder of cartilage, unspecified: Secondary | ICD-10-CM

## 2012-05-03 DIAGNOSIS — M899 Disorder of bone, unspecified: Secondary | ICD-10-CM

## 2012-05-03 DIAGNOSIS — N959 Unspecified menopausal and perimenopausal disorder: Secondary | ICD-10-CM

## 2012-05-03 DIAGNOSIS — I10 Essential (primary) hypertension: Secondary | ICD-10-CM

## 2012-05-03 DIAGNOSIS — Z0000 Encounter for general adult medical examination without abnormal findings: Secondary | ICD-10-CM

## 2012-05-03 LAB — HEPATIC FUNCTION PANEL
Albumin: 3.7 g/dL (ref 3.5–5.2)
Total Protein: 7.5 g/dL (ref 6.0–8.3)

## 2012-05-03 LAB — BASIC METABOLIC PANEL
BUN: 20 mg/dL (ref 6–23)
Creatinine, Ser: 1.1 mg/dL (ref 0.4–1.2)
GFR: 50.65 mL/min — ABNORMAL LOW (ref >60.00)

## 2012-05-03 LAB — LIPID PANEL
Cholesterol: 192 mg/dL (ref 0–200)
HDL: 81.9 mg/dL (ref >39.00)
LDL Cholesterol: 93 mg/dL (ref 0–99)
Triglycerides: 85 mg/dL (ref 0.0–149.0)
VLDL: 17 mg/dL (ref 0.0–40.0)

## 2012-05-03 LAB — TSH: TSH: 1.55 u[IU]/mL (ref 0.35–5.50)

## 2012-05-03 MED ORDER — ESOMEPRAZOLE MAGNESIUM 40 MG PO CPDR: 40.0000 mg | DELAYED_RELEASE_CAPSULE | Freq: Two times a day (BID) | ORAL | Status: DC

## 2012-05-03 MED ORDER — VALSARTAN-HYDROCHLOROTHIAZIDE 160-12.5 MG PO TABS: 1.0000 | ORAL_TABLET | Freq: Every day | ORAL | Status: DC

## 2012-05-03 MED ORDER — SIMVASTATIN 40 MG PO TABS: 40.0000 mg | ORAL_TABLET | Freq: Every day | ORAL | Status: DC

## 2012-05-03 MED ORDER — BLACK COHOSH 40 MG PO CAPS: 40.0000 mg | ORAL_CAPSULE | Freq: Three times a day (TID) | ORAL | Status: DC

## 2012-05-03 MED ORDER — NADOLOL 80 MG PO TABS: 80.0000 mg | ORAL_TABLET | Freq: Two times a day (BID) | ORAL | Status: DC

## 2012-05-03 NOTE — Assessment & Plan Note (Signed)
On statin - check annually Last lipids reviewed 

## 2012-05-03 NOTE — Assessment & Plan Note (Signed)
BP Readings from Last 3 Encounters:  05/03/12 142/84  01/04/12 128/72  03/25/11 118/74   The current medical regimen is effective;  continue present plan and medications.  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

## 2012-05-03 NOTE — Patient Instructions (Signed)
It was good to see you today. We have reviewed your prior records including labs and tests today Test(s) ordered today. Your results will be called to you after review (48-72hours after test completion). If any changes need to be made, you will be notified at that time. Medications reviewed, no changes at this time. Refill on medication(s) as discussed today. Health Maintenance reviewed - all recommended immunizations and age-appropriate screenings are up-to-date.  we'll make referral for bone density at [facility] . Our office will contact you regarding appointment(s) once made. Please schedule followup in 6 months, call sooner if problems.

## 2012-05-03 NOTE — Progress Notes (Signed)
Subjective:    Patient ID: Joy Martin, female    DOB: 1941-05-19, 72 y.o.   MRN: [id number]  HPI   Here for medicare wellness  Diet: heart healthy Physical activity: sedentary Depression/mood screen: negative Hearing: intact to whispered voice Visual acuity: grossly normal, performs annual eye exam  ADLs: capable Fall risk: none Home safety: good Cognitive evaluation: intact to orientation, naming, recall and repetition EOL planning: adv directives, full code/ I agree  I have personally reviewed and have noted 1. The patient's medical and social history 2. Their use of alcohol, tobacco or illicit drugs 3. Their current medications and supplements 4. The patient's functional ability including ADL's, fall risks, home safety risks and hearing or visual impairment. 5. Diet and physical activities 6. Evidence for depression or mood disorders   Also reviewed chronic medical issues today  hypertension - the patient reports compliance with medication(s) as prescribed. Denies adverse side effects.  Dyslipidemia - on statin for years - the patient reports compliance with medication(s) as prescribed. Denies adverse side effects.  GERD - on PPI bid since summer 2012  Past Medical History  Diagnosis Date  . Arthritis   . ALLERGIC RHINITIS, CHRONIC   . ANEMIA   . Episodic tension type headache   . Hypertension   . EXOGENOUS OBESITY   . GERD   . HYPERLIPIDEMIA   . OSTEOPENIA    Family History  Problem Relation Age of Onset  . Prostate cancer Father   . Alcohol abuse Other   . Heart disease Other   . Hypertension Other   . Diabetes Other   . Pancreatic cancer Other    History  Substance Use Topics  . Smoking status: Former Games developer  . Smokeless tobacco: Never Used  . Alcohol Use: Yes     occ    Review of Systems  Constitutional: Negative for fever or weight change. Hot flashes - unchanged since weaning self off HRT Respiratory: Negative for cough and shortness  of breath.   Cardiovascular: Negative for chest pain or palpitations.  Gastrointestinal: Negative for abdominal pain, no bowel changes.  Musculoskeletal: Negative for gait problem or joint swelling.  Skin: Negative for rash.  Neurological: Negative for dizziness or headache.  No other specific complaints in a complete review of systems (except as listed in HPI above).     Objective:   Physical Exam  BP 142/84  Pulse 55  Temp 98.4 F (36.9 C) (Oral)  Ht 5\' 2"  (1.575 m)  Wt 198 lb 12.8 oz (90.175 kg)  BMI 36.36 kg/m2  SpO2 96% Wt Readings from Last 3 Encounters:  05/03/12 198 lb 12.8 oz (90.175 kg)  01/04/12 201 lb 1.9 oz (91.227 kg)  03/25/11 200 lb (90.719 kg)   Constitutional: She is overweight, but appears well-developed and well-nourished. No distress.  HENT: Head: Normocephalic and atraumatic. Ears: B TMs ok, no erythema or effusion; Nose: Nose normal. Mouth/Throat: Oropharynx is clear and moist. No oropharyngeal exudate.  Eyes: Conjunctivae and EOM are normal. Pupils are equal, round, and reactive to light. No scleral icterus.  Neck: Thick, Normal range of motion. Neck supple. No JVD present. No thyromegaly present.  Cardiovascular: Normal rate, regular rhythm and normal heart sounds.  No murmur heard. No BLE edema. Pulmonary/Chest: Effort normal and breath sounds normal. No respiratory distress. She has no wheezes.  Musculoskeletal: Normal range of motion, no joint effusions. No gross deformities Neurological: She is alert and oriented to person, place, and time. No cranial nerve deficit.  Coordination normal.  Skin: Skin is warm and dry. No rash noted. No erythema.  Psychiatric: She has a normal mood and affect. Her behavior is normal. Judgment and thought content normal.   Lab Results  Component Value Date   WBC 5.9 03/25/2011   HGB 12.3 03/25/2011   HCT 36.4 03/25/2011   PLT 245.0 03/25/2011   GLUCOSE 97 01/04/2012   CHOL 175 03/25/2011   TRIG 80.0 03/25/2011   HDL 71.40  03/25/2011   LDLDIRECT 143.2 12/19/2007   LDLCALC 88 03/25/2011   ALT 22 03/25/2011   AST 28 03/25/2011   NA 143 01/04/2012   K 5.4* 01/04/2012   CL 105 01/04/2012   CREATININE 1.3* 01/04/2012   BUN 22 01/04/2012   CO2 30 01/04/2012   TSH 1.79 03/25/2011   HGBA1C 6.0 12/13/2006   ECG: sisus brady @ 54 bpm - no ischemic changes    Assessment & Plan:  AWV/v70.0 - Today patient counseled on age appropriate routine health concerns for screening and prevention, each reviewed and up to date or declined. Immunizations reviewed and up to date or declined. Labs/ECG reviewed. Risk factors for depression reviewed and negative. Hearing function and visual acuity are intact. ADLs screened and addressed as needed. Functional ability and level of safety reviewed and appropriate. Education, counseling and referrals performed based on assessed risks today. Patient provided with a copy of personalized plan for preventive services.   Also see problem list. Medications and labs reviewed today.

## 2012-05-03 NOTE — Assessment & Plan Note (Signed)
On calcium and Estrace -  Due for follow up DEXA - will refer now - pt requests [facility]

## 2012-05-12 LAB — HM DEXA SCAN

## 2012-05-16 ENCOUNTER — Encounter: Payer: Self-pay | Admitting: Internal Medicine

## 2012-05-17 ENCOUNTER — Telehealth: Payer: Self-pay | Admitting: Internal Medicine

## 2012-05-17 DIAGNOSIS — M899 Disorder of bone, unspecified: Secondary | ICD-10-CM

## 2012-05-17 MED ORDER — ALENDRONATE SODIUM 70 MG PO TABS: 70.0000 mg | ORAL_TABLET | ORAL | Status: DC

## 2012-05-17 NOTE — Telephone Encounter (Signed)
Please call pt - progressive bone loss on 05/2012 [facility] DEXA: now -1.9, prev -1.1 in 2010 (more negative=weaker bones) I recommend adding weekly fosamax to ongoing Ca, Vit D and estrace (hormone) Please send erx fosamax 70mg  weekly if pt agrees - No other change recommended

## 2012-05-17 NOTE — Telephone Encounter (Signed)
Notified pt with md response & recommendations... 05/17/12@4 :41pm/LMB

## 2012-05-17 NOTE — Telephone Encounter (Signed)
Called pt no answer LMOM RTC.. 05/17/12@2 :54pm/LMB

## 2012-05-22 ENCOUNTER — Encounter: Payer: Self-pay | Admitting: Internal Medicine

## 2012-06-13 ENCOUNTER — Other Ambulatory Visit: Payer: Self-pay | Admitting: *Deleted

## 2012-06-13 MED ORDER — BUTALBITAL-ASA-CAFF-CODEINE 50-325-40-30 MG PO CAPS: 1.0000 | ORAL_CAPSULE | ORAL | Status: DC | PRN

## 2012-06-13 NOTE — Telephone Encounter (Signed)
[facility]... 06/13/12@1 :15pm/LMB

## 2012-07-10 ENCOUNTER — Other Ambulatory Visit: Payer: Self-pay

## 2012-07-10 ENCOUNTER — Encounter (HOSPITAL_COMMUNITY): Payer: Self-pay | Admitting: *Deleted

## 2012-07-10 ENCOUNTER — Emergency Department (HOSPITAL_COMMUNITY): Payer: Medicare Other | Admitting: Anesthesiology

## 2012-07-10 ENCOUNTER — Encounter (HOSPITAL_COMMUNITY): Admission: EM | Disposition: A | Discharge status: Home / Self Care | Payer: Self-pay | Source: Home / Self Care

## 2012-07-10 ENCOUNTER — Observation Stay (HOSPITAL_COMMUNITY)
Admission: EM | Admit: 2012-07-10 | Discharge: 2012-07-11 | Disposition: A | Discharge status: Home / Self Care | Payer: Medicare Other | Attending: General Surgery | Admitting: General Surgery

## 2012-07-10 ENCOUNTER — Emergency Department (HOSPITAL_COMMUNITY): Payer: Medicare Other

## 2012-07-10 ENCOUNTER — Encounter (HOSPITAL_COMMUNITY): Payer: Self-pay | Admitting: Certified Registered"

## 2012-07-10 ENCOUNTER — Encounter (HOSPITAL_COMMUNITY): Payer: Self-pay | Admitting: Anesthesiology

## 2012-07-10 DIAGNOSIS — G43909 Migraine, unspecified, not intractable, without status migrainosus: Secondary | ICD-10-CM

## 2012-07-10 DIAGNOSIS — E785 Hyperlipidemia, unspecified: Secondary | ICD-10-CM | POA: Insufficient documentation

## 2012-07-10 DIAGNOSIS — E349 Endocrine disorder, unspecified: Secondary | ICD-10-CM | POA: Insufficient documentation

## 2012-07-10 DIAGNOSIS — I10 Essential (primary) hypertension: Secondary | ICD-10-CM | POA: Insufficient documentation

## 2012-07-10 DIAGNOSIS — K219 Gastro-esophageal reflux disease without esophagitis: Secondary | ICD-10-CM | POA: Insufficient documentation

## 2012-07-10 DIAGNOSIS — K37 Unspecified appendicitis: Secondary | ICD-10-CM

## 2012-07-10 DIAGNOSIS — K3580 Unspecified acute appendicitis: Secondary | ICD-10-CM

## 2012-07-10 HISTORY — DX: Cardiac murmur, unspecified: R01.1

## 2012-07-10 HISTORY — PX: APPENDECTOMY: SHX54

## 2012-07-10 HISTORY — PX: LAPAROSCOPIC APPENDECTOMY: SHX408

## 2012-07-10 HISTORY — DX: Migraine, unspecified, not intractable, without status migrainosus: G43.909

## 2012-07-10 LAB — CBC WITH DIFFERENTIAL/PLATELET
Basophils Absolute: 0 10*3/uL (ref 0.0–0.1)
Basophils Relative: 0 % (ref 0–1)
Eosinophils Relative: 0 % (ref 0–5)
HCT: 35.4 % — ABNORMAL LOW (ref 36.0–46.0)
Lymphocytes Relative: 9 % — ABNORMAL LOW (ref 12–46)
MCHC: 33.6 g/dL (ref 30.0–36.0)
MCV: 93.7 fL (ref 78.0–100.0)
Monocytes Absolute: 0.8 10*3/uL (ref 0.1–1.0)
Platelets: 240 10*3/uL (ref 150–400)
RDW: 12.9 % (ref 11.5–15.5)
WBC: 13 10*3/uL — ABNORMAL HIGH (ref 4.0–10.5)

## 2012-07-10 LAB — COMPREHENSIVE METABOLIC PANEL
AST: 21 U/L (ref 0–37)
Albumin: 3.3 g/dL — ABNORMAL LOW (ref 3.5–5.2)
BUN: 19 mg/dL (ref 6–23)
Calcium: 10.6 mg/dL — ABNORMAL HIGH (ref 8.4–10.5)
Chloride: 100 mEq/L (ref 96–112)
Creatinine, Ser: 1.03 mg/dL (ref 0.50–1.10)
GFR calc non Af Amer: 53 mL/min — ABNORMAL LOW (ref >90)
Total Bilirubin: 0.3 mg/dL (ref 0.3–1.2)

## 2012-07-10 LAB — URINALYSIS, ROUTINE W REFLEX MICROSCOPIC
Bilirubin Urine: NEGATIVE
Ketones, ur: NEGATIVE mg/dL
Nitrite: NEGATIVE
pH: 7 (ref 5.0–8.0)

## 2012-07-10 SURGERY — APPENDECTOMY, LAPAROSCOPIC
Anesthesia: General

## 2012-07-10 SURGERY — APPENDECTOMY, LAPAROSCOPIC
Anesthesia: General | Wound class: Contaminated

## 2012-07-10 MED ORDER — ENOXAPARIN SODIUM 40 MG/0.4ML ~~LOC~~ SOLN
40.0000 mg | SUBCUTANEOUS | Status: DC
  Filled 2012-07-10: qty 0.4

## 2012-07-10 MED ORDER — SUCCINYLCHOLINE CHLORIDE 20 MG/ML IJ SOLN
INTRAMUSCULAR | Status: DC | PRN
  Administered 2012-07-10: 100 mg via INTRAVENOUS

## 2012-07-10 MED ORDER — IOHEXOL 300 MG/ML  SOLN
80.0000 mL | Freq: Once | INTRAMUSCULAR | Status: AC | PRN
  Administered 2012-07-10: 80 mL via INTRAVENOUS

## 2012-07-10 MED ORDER — ONDANSETRON HCL 4 MG/2ML IJ SOLN: 4.0000 mg | Freq: Four times a day (QID) | INTRAMUSCULAR | Status: DC | PRN

## 2012-07-10 MED ORDER — BUPIVACAINE-EPINEPHRINE 0.5% -1:200000 IJ SOLN
INTRAMUSCULAR | Status: DC | PRN
  Administered 2012-07-10: 20 mL

## 2012-07-10 MED ORDER — 0.9 % SODIUM CHLORIDE (POUR BTL) OPTIME
TOPICAL | Status: DC | PRN
  Administered 2012-07-10: 1000 mL

## 2012-07-10 MED ORDER — ONDANSETRON HCL 4 MG/2ML IJ SOLN
INTRAMUSCULAR | Status: DC | PRN
  Administered 2012-07-10: 4 mg via INTRAVENOUS

## 2012-07-10 MED ORDER — IOHEXOL 300 MG/ML  SOLN: 20.0000 mL | INTRAMUSCULAR | Status: DC

## 2012-07-10 MED ORDER — MORPHINE SULFATE 2 MG/ML IJ SOLN
2.0000 mg | INTRAMUSCULAR | Status: DC | PRN
  Administered 2012-07-11: 2 mg via INTRAVENOUS
  Filled 2012-07-10: qty 1

## 2012-07-10 MED ORDER — NADOLOL 80 MG PO TABS
80.0000 mg | ORAL_TABLET | Freq: Two times a day (BID) | ORAL | Status: DC
  Administered 2012-07-10: 80 mg via ORAL
  Filled 2012-07-10 (×3): qty 1

## 2012-07-10 MED ORDER — VALSARTAN-HYDROCHLOROTHIAZIDE 160-12.5 MG PO TABS: 1.0000 | ORAL_TABLET | Freq: Every day | ORAL | Status: DC

## 2012-07-10 MED ORDER — SODIUM CHLORIDE 0.9 % IV SOLN
1.0000 g | INTRAVENOUS | Status: AC
  Administered 2012-07-10: 1 g via INTRAVENOUS
  Filled 2012-07-10: qty 1

## 2012-07-10 MED ORDER — ONDANSETRON HCL 4 MG/2ML IJ SOLN: 4.0000 mg | Freq: Once | INTRAMUSCULAR | Status: DC

## 2012-07-10 MED ORDER — GLYCOPYRROLATE 0.2 MG/ML IJ SOLN
INTRAMUSCULAR | Status: DC | PRN
  Administered 2012-07-10: .8 mg via INTRAVENOUS
  Administered 2012-07-10: .2 mg via INTRAVENOUS

## 2012-07-10 MED ORDER — HYDROMORPHONE HCL PF 1 MG/ML IJ SOLN
0.2500 mg | INTRAMUSCULAR | Status: DC | PRN
  Administered 2012-07-10 (×2): 0.5 mg via INTRAVENOUS

## 2012-07-10 MED ORDER — POTASSIUM CHLORIDE IN NACL 20-0.9 MEQ/L-% IV SOLN
INTRAVENOUS | Status: DC
  Administered 2012-07-10: 20:00:00 via INTRAVENOUS
  Filled 2012-07-10 (×2): qty 1000

## 2012-07-10 MED ORDER — IRBESARTAN 150 MG PO TABS
150.0000 mg | ORAL_TABLET | Freq: Every day | ORAL | Status: DC
  Filled 2012-07-10: qty 1

## 2012-07-10 MED ORDER — SODIUM CHLORIDE 0.9 % IV SOLN
1.0000 g | Freq: Once | INTRAVENOUS | Status: DC
  Filled 2012-07-10: qty 1

## 2012-07-10 MED ORDER — PROPOFOL 10 MG/ML IV BOLUS
INTRAVENOUS | Status: DC | PRN
  Administered 2012-07-10: 130 mg via INTRAVENOUS

## 2012-07-10 MED ORDER — ROCURONIUM BROMIDE 100 MG/10ML IV SOLN
INTRAVENOUS | Status: DC | PRN
  Administered 2012-07-10: 35 mg via INTRAVENOUS

## 2012-07-10 MED ORDER — FENTANYL CITRATE 0.05 MG/ML IJ SOLN
INTRAMUSCULAR | Status: DC | PRN
  Administered 2012-07-10: 100 ug via INTRAVENOUS
  Administered 2012-07-10 (×2): 50 ug via INTRAVENOUS

## 2012-07-10 MED ORDER — CYCLOSPORINE 0.05 % OP EMUL
1.0000 [drp] | Freq: Two times a day (BID) | OPHTHALMIC | Status: DC
  Administered 2012-07-10: 1 [drp] via OPHTHALMIC
  Filled 2012-07-10 (×3): qty 1

## 2012-07-10 MED ORDER — ONDANSETRON HCL 4 MG/2ML IJ SOLN: 4.0000 mg | Freq: Once | INTRAMUSCULAR | Status: DC | PRN

## 2012-07-10 MED ORDER — LACTATED RINGERS IV SOLN
INTRAVENOUS | Status: DC | PRN
  Administered 2012-07-10 (×2): via INTRAVENOUS

## 2012-07-10 MED ORDER — NEOSTIGMINE METHYLSULFATE 1 MG/ML IJ SOLN
INTRAMUSCULAR | Status: DC | PRN
  Administered 2012-07-10: 5 mg via INTRAVENOUS

## 2012-07-10 MED ORDER — ACETAMINOPHEN 10 MG/ML IV SOLN: 1000.0000 mg | Freq: Once | INTRAVENOUS | Status: DC | PRN

## 2012-07-10 MED ORDER — HYDROCHLOROTHIAZIDE 12.5 MG PO CAPS
12.5000 mg | ORAL_CAPSULE | Freq: Every day | ORAL | Status: DC
  Filled 2012-07-10: qty 1

## 2012-07-10 MED ORDER — LIDOCAINE HCL (CARDIAC) 20 MG/ML IV SOLN
INTRAVENOUS | Status: DC | PRN
  Administered 2012-07-10: 40 mg via INTRAVENOUS

## 2012-07-10 MED ORDER — HYDROCODONE-ACETAMINOPHEN 5-325 MG PO TABS
1.0000 | ORAL_TABLET | ORAL | Status: DC | PRN
  Administered 2012-07-10: 1 via ORAL
  Filled 2012-07-10: qty 1

## 2012-07-10 MED ORDER — IBUPROFEN 600 MG PO TABS
600.0000 mg | ORAL_TABLET | Freq: Four times a day (QID) | ORAL | Status: DC | PRN
  Filled 2012-07-10: qty 1

## 2012-07-10 MED ORDER — ONDANSETRON HCL 4 MG PO TABS: 4.0000 mg | ORAL_TABLET | Freq: Four times a day (QID) | ORAL | Status: DC | PRN

## 2012-07-10 MED ORDER — SODIUM CHLORIDE 0.9 % IV BOLUS (SEPSIS)
500.0000 mL | Freq: Once | INTRAVENOUS | Status: AC
  Administered 2012-07-10: 1000 mL via INTRAVENOUS

## 2012-07-10 MED ORDER — SODIUM CHLORIDE 0.9 % IR SOLN
Status: DC | PRN
  Administered 2012-07-10: 1000 mL

## 2012-07-10 SURGICAL SUPPLY — 38 items
APPLIER CLIP LOGIC TI 5 (MISCELLANEOUS) IMPLANT
APPLIER CLIP ROT 10 11.4 M/L (STAPLE) ×2
BANDAGE ADHESIVE 1X3 (GAUZE/BANDAGES/DRESSINGS) ×6 IMPLANT
BENZOIN TINCTURE PRP APPL 2/3 (GAUZE/BANDAGES/DRESSINGS) ×2 IMPLANT
CANISTER SUCTION 2500CC (MISCELLANEOUS) ×2 IMPLANT
CHLORAPREP W/TINT 26ML (MISCELLANEOUS) ×2 IMPLANT
CLIP APPLIE ROT 10 11.4 M/L (STAPLE) ×1 IMPLANT
CLOTH BEACON ORANGE TIMEOUT ST (SAFETY) ×2 IMPLANT
COVER SURGICAL LIGHT HANDLE (MISCELLANEOUS) ×2 IMPLANT
CUTTER LINEAR ENDO 35 ETS (STAPLE) IMPLANT
CUTTER LINEAR ENDO 35 ETS TH (STAPLE) IMPLANT
DECANTER SPIKE VIAL GLASS SM (MISCELLANEOUS) ×2 IMPLANT
ELECT REM PT RETURN 9FT ADLT (ELECTROSURGICAL) ×2
ELECTRODE REM PT RTRN 9FT ADLT (ELECTROSURGICAL) ×1 IMPLANT
ENDOLOOP SUT PDS II  0 18 (SUTURE)
ENDOLOOP SUT PDS II 0 18 (SUTURE) IMPLANT
GLOVE SURG SIGNA 7.5 PF LTX (GLOVE) ×2 IMPLANT
GOWN PREVENTION PLUS XLARGE (GOWN DISPOSABLE) ×2 IMPLANT
GOWN STRL NON-REIN LRG LVL3 (GOWN DISPOSABLE) ×2 IMPLANT
KIT BASIN OR (CUSTOM PROCEDURE TRAY) ×2 IMPLANT
KIT ROOM TURNOVER OR (KITS) ×2 IMPLANT
NS IRRIG 1000ML POUR BTL (IV SOLUTION) ×2 IMPLANT
PAD ARMBOARD 7.5X6 YLW CONV (MISCELLANEOUS) ×4 IMPLANT
POUCH SPECIMEN RETRIEVAL 10MM (ENDOMECHANICALS) ×2 IMPLANT
RELOAD /EVU35 (ENDOMECHANICALS) IMPLANT
RELOAD CUTTER ETS 35MM STAND (ENDOMECHANICALS) IMPLANT
SCALPEL HARMONIC ACE (MISCELLANEOUS) ×2 IMPLANT
SET IRRIG TUBING LAPAROSCOPIC (IRRIGATION / IRRIGATOR) ×2 IMPLANT
SLEEVE ENDOPATH XCEL 5M (ENDOMECHANICALS) ×2 IMPLANT
SPECIMEN JAR SMALL (MISCELLANEOUS) ×2 IMPLANT
STRIP CLOSURE SKIN 1/2X4 (GAUZE/BANDAGES/DRESSINGS) ×2 IMPLANT
SUT MON AB 4-0 PC3 18 (SUTURE) ×2 IMPLANT
TOWEL OR 17X24 6PK STRL BLUE (TOWEL DISPOSABLE) ×2 IMPLANT
TOWEL OR 17X26 10 PK STRL BLUE (TOWEL DISPOSABLE) ×2 IMPLANT
TRAY LAPAROSCOPIC (CUSTOM PROCEDURE TRAY) ×2 IMPLANT
TROCAR XCEL BLUNT TIP 100MML (ENDOMECHANICALS) ×2 IMPLANT
TROCAR XCEL NON-BLD 5MMX100MML (ENDOMECHANICALS) ×2 IMPLANT
WATER STERILE IRR 1000ML POUR (IV SOLUTION) IMPLANT

## 2012-07-10 NOTE — ED Notes (Signed)
Patient refuses Zofran at this time she denies nausea.

## 2012-07-10 NOTE — ED Notes (Signed)
CT notified that patient is ready for CT Scan

## 2012-07-10 NOTE — ED Provider Notes (Signed)
History     CSN: [id number]  Arrival date & time 07/10/12  1037   First MD Initiated Contact with Patient 07/10/12 1049      Chief Complaint  Patient presents with  . Abdominal Pain    (Consider location/radiation/quality/duration/timing/severity/associated sxs/prior treatment) HPI Comments: Patient complains of a two-day history of constant discomfort in her right lower quadrant. It is nonradiating She states is not worse with movement. Not worse with urination. She's had some nausea but no vomiting. She's having normal bowel movements. She has some urinary frequency but no burning on urination. She denies any fevers or chills. Denies a history of abdominal surgeries in the past.  Patient is a 71 y.o. female presenting with abdominal pain. The history is provided by the patient.  Abdominal Pain The primary symptoms of the illness include abdominal pain and nausea. The primary symptoms of the illness do not include fever, fatigue, shortness of breath, vomiting or diarrhea.  Additional symptoms associated with the illness include frequency. Symptoms associated with the illness do not include chills, diaphoresis, hematuria or back pain.    Past Medical History  Diagnosis Date  . Arthritis   . ALLERGIC RHINITIS, CHRONIC   . ANEMIA   . Episodic tension type headache   . Hypertension   . EXOGENOUS OBESITY   . GERD   . HYPERLIPIDEMIA   . OSTEOPENIA     Past Surgical History  Procedure Date  . Arthroscopic surgery right knee   . Breast surgery     Breast biopsy in  1980's  . Tonsillectomy 1946  . Knee surgery     torn cartilage in 1970's or 1980's  . Mole removal 1957  . Wisdom tooth extraction 1959  . Cataract extraction     Family History  Problem Relation Age of Onset  . Prostate cancer Father   . Alcohol abuse Other   . Heart disease Other   . Hypertension Other   . Diabetes Other   . Pancreatic cancer Other     History  Substance Use Topics  . Smoking status:  Former Games developer  . Smokeless tobacco: Never Used  . Alcohol Use: Yes     occ    OB History    Grav Para Term Preterm Abortions TAB SAB Ect Mult Living                  Review of Systems  Constitutional: Negative for fever, chills, diaphoresis and fatigue.  HENT: Negative for congestion, rhinorrhea and sneezing.   Eyes: Negative.   Respiratory: Negative for cough, chest tightness and shortness of breath.   Cardiovascular: Negative for chest pain and leg swelling.  Gastrointestinal: Positive for nausea and abdominal pain. Negative for vomiting, diarrhea and blood in stool.  Genitourinary: Positive for frequency. Negative for hematuria, flank pain and difficulty urinating.  Musculoskeletal: Negative for back pain and arthralgias.  Skin: Negative for rash.  Neurological: Negative for dizziness, speech difficulty, weakness, numbness and headaches.    Allergies  Allegra  Home Medications   Current Outpatient Rx  Name Route Sig Dispense Refill  . ALENDRONATE SODIUM 70 MG PO TABS Oral Take 1 tablet (70 mg total) by mouth every 7 (seven) days. Take with a full glass of water on an empty stomach. 4 tablet 5  . ASPIRIN 81 MG PO TABS Oral Take 81 mg by mouth daily.      Marland Kitchen BLACK COHOSH 40 MG PO CAPS Oral Take 1 capsule (40 mg total) by mouth  3 (three) times daily.  0  . BUTALBITAL-ASA-CAFF-CODEINE 50-325-40-30 MG PO CAPS Oral Take 1 capsule by mouth every 4 (four) hours as needed. NOT EXCEED 4 PER DAY 100 capsule 0  . CALCIUM 500 PO Oral Take by mouth.      Marland Kitchen VITAMIN D 1000 UNITS PO TABS Oral Take 1,000 Units by mouth daily.    Marland Kitchen CICLOPIROX OLAMINE 0.77 % EX CREA Topical Apply 1 application topically 2 (two) times daily. rash    . CYCLOSPORINE 0.05 % OP EMUL  1 drop 2 (two) times daily.      Marland Kitchen ESOMEPRAZOLE MAGNESIUM 40 MG PO CPDR Oral Take 1 capsule (40 mg total) by mouth 2 (two) times daily before a meal. 180 capsule 3  . OMEGA-3 FATTY ACIDS 1000 MG PO CAPS Oral Take 2 g by mouth daily.       Marland Kitchen FLAXSEED OIL PO Oral Take by mouth.      Marland Kitchen FLUTICASONE PROPIONATE 50 MCG/ACT NA SUSP Nasal Place 1 spray into the nose daily. 16 g 5  . MULTIVITAMINS PO TABS Oral Take 1 tablet by mouth daily.      Marland Kitchen NABUMETONE 750 MG PO TABS Oral Take 1 tablet (750 mg total) by mouth 2 (two) times daily as needed for pain. 60 tablet 5  . NADOLOL 80 MG PO TABS Oral Take 1 tablet (80 mg total) by mouth 2 (two) times daily. 180 tablet 3  . SIMVASTATIN 40 MG PO TABS Oral Take 1 tablet (40 mg total) by mouth at bedtime. 90 tablet 3  . VALSARTAN-HYDROCHLOROTHIAZIDE 160-12.5 MG PO TABS Oral Take 1 tablet by mouth daily. 90 tablet 3    BP 148/54  Pulse 81  Temp 98.8 F (37.1 C) (Oral)  Resp 20  SpO2 96%  Physical Exam  Constitutional: She is oriented to person, place, and time. She appears well-developed and well-nourished.  HENT:  Head: Normocephalic and atraumatic.  Eyes: Pupils are equal, round, and reactive to light.  Neck: Normal range of motion. Neck supple.  Cardiovascular: Normal rate, regular rhythm and normal heart sounds.   Pulmonary/Chest: Effort normal and breath sounds normal. No respiratory distress. She has no wheezes. She has no rales. She exhibits no tenderness.  Abdominal: Soft. Bowel sounds are normal. There is tenderness (Moderate tenderness to the right lower quadrant. No rebound or guarding.). There is no rebound and no guarding.  Musculoskeletal: Normal range of motion. She exhibits no edema.  Lymphadenopathy:    She has no cervical adenopathy.  Neurological: She is alert and oriented to person, place, and time.  Skin: Skin is warm and dry. No rash noted.  Psychiatric: She has a normal mood and affect.    ED Course  Procedures (including critical care time)  Results for orders placed during the hospital encounter of 07/10/12  CBC WITH DIFFERENTIAL      Component Value Range   WBC 13.0 (*) 4.0 - 10.5 K/uL   RBC 3.78 (*) 3.87 - 5.11 MIL/uL   Hemoglobin 11.9 (*) 12.0 - 15.0  g/dL   HCT 78.2 (*) 95.6 - 21.3 %   MCV 93.7  78.0 - 100.0 fL   MCH 31.5  26.0 - 34.0 pg   MCHC 33.6  30.0 - 36.0 g/dL   RDW 08.6  57.8 - 46.9 %   Platelets 240  150 - 400 K/uL   Neutrophils Relative 85 (*) 43 - 77 %   Neutro Abs 11.0 (*) 1.7 - 7.7 K/uL  Lymphocytes Relative 9 (*) 12 - 46 %   Lymphs Abs 1.2  0.7 - 4.0 K/uL   Monocytes Relative 6  3 - 12 %   Monocytes Absolute 0.8  0.1 - 1.0 K/uL   Eosinophils Relative 0  0 - 5 %   Eosinophils Absolute 0.1  0.0 - 0.7 K/uL   Basophils Relative 0  0 - 1 %   Basophils Absolute 0.0  0.0 - 0.1 K/uL  COMPREHENSIVE METABOLIC PANEL      Component Value Range   Sodium 138  135 - 145 mEq/L   Potassium 3.5  3.5 - 5.1 mEq/L   Chloride 100  96 - 112 mEq/L   CO2 30  19 - 32 mEq/L   Glucose, Bld 128 (*) 70 - 99 mg/dL   BUN 19  6 - 23 mg/dL   Creatinine, Ser 9.60  0.50 - 1.10 mg/dL   Calcium 45.4 (*) 8.4 - 10.5 mg/dL   Total Protein 7.2  6.0 - 8.3 g/dL   Albumin 3.3 (*) 3.5 - 5.2 g/dL   AST 21  0 - 37 U/L   ALT 15  0 - 35 U/L   Alkaline Phosphatase 86  39 - 117 U/L   Total Bilirubin 0.3  0.3 - 1.2 mg/dL   GFR calc non Af Amer 53 (*) >90 mL/min   GFR calc Af Amer 61 (*) >90 mL/min  URINALYSIS, ROUTINE W REFLEX MICROSCOPIC      Component Value Range   Color, Urine YELLOW  YELLOW   APPearance CLEAR  CLEAR   Specific Gravity, Urine 1.007  1.005 - 1.030   pH 7.0  5.0 - 8.0   Glucose, UA NEGATIVE  NEGATIVE mg/dL   Hgb urine dipstick NEGATIVE  NEGATIVE   Bilirubin Urine NEGATIVE  NEGATIVE   Ketones, ur NEGATIVE  NEGATIVE mg/dL   Protein, ur NEGATIVE  NEGATIVE mg/dL   Urobilinogen, UA 0.2  0.0 - 1.0 mg/dL   Nitrite NEGATIVE  NEGATIVE   Leukocytes, UA NEGATIVE  NEGATIVE   No results found.    1. Appendicitis       MDM  Pt with evidence of appendicitis on CT.  Discussed with Dr. Magnus Ivan who will see pt.        Rolan Bucco, MD 07/10/12 [phone]

## 2012-07-10 NOTE — Anesthesia Preprocedure Evaluation (Addendum)
Anesthesia Evaluation  Patient identified by MRN, date of birth, ID band Patient awake    Reviewed: Allergy & Precautions, H&P , NPO status , Patient's Chart, lab work & pertinent test results, reviewed documented beta blocker date and time   Airway Mallampati: I TM Distance: >3 FB Neck ROM: Full    Dental  (+) Teeth Intact and Dental Advisory Given   Pulmonary  breath sounds clear to auscultation        Cardiovascular hypertension, Pt. on medications and Pt. on home beta blockers Rhythm:Regular Rate:Normal     Neuro/Psych  Headaches,    GI/Hepatic GERD-  Medicated and Controlled,  Endo/Other    Renal/GU      Musculoskeletal  (+) Arthritis -, Osteoarthritis,    Abdominal   Peds  Hematology   Anesthesia Other Findings   Reproductive/Obstetrics                           Anesthesia Physical Anesthesia Plan  ASA: II and Emergent  Anesthesia Plan: General   Post-op Pain Management:    Induction: Intravenous  Airway Management Planned: Oral ETT  Additional Equipment:   Intra-op Plan:   Post-operative Plan: Extubation in OR  Informed Consent: I have reviewed the patients History and Physical, chart, labs and discussed the procedure including the risks, benefits and alternatives for the proposed anesthesia with the patient or authorized representative who has indicated his/her understanding and acceptance.   Dental advisory given  Plan Discussed with: Anesthesiologist and Surgeon  Anesthesia Plan Comments: (Acute appendicitis Htn  Plan GA with RSI  Kipp Brood, MD)       Anesthesia Quick Evaluation

## 2012-07-10 NOTE — Anesthesia Procedure Notes (Signed)
Procedure Name: Intubation Date/Time: 07/10/2012 4:46 PM Performed by: Molli Hazard Pre-anesthesia Checklist: Patient identified Patient Re-evaluated:Patient Re-evaluated prior to inductionOxygen Delivery Method: Circle system utilized Preoxygenation: Pre-oxygenation with 100% oxygen Intubation Type: IV induction, Rapid sequence and Cricoid Pressure applied Laryngoscope Size: Miller and 2 Grade View: Grade I Tube type: Oral Tube size: 7.5 mm Number of attempts: 1 Airway Equipment and Method: Stylet Placement Confirmation: ETT inserted through vocal cords under direct vision,  positive ETCO2 and breath sounds checked- equal and bilateral Secured at: 21 cm Tube secured with: Tape Dental Injury: Teeth and Oropharynx as per pre-operative assessment

## 2012-07-10 NOTE — Transfer of Care (Signed)
Immediate Anesthesia Transfer of Care Note  Patient: Joy Martin  Procedure(s) Performed: Procedure(s) (LRB): APPENDECTOMY LAPAROSCOPIC (N/A)  Patient Location: PACU  Anesthesia Type: General  Level of Consciousness: awake, alert  and oriented  Airway & Oxygen Therapy: Patient connected to nasal cannula oxygen  Post-op Assessment: Report given to PACU RN, Post -op Vital signs reviewed and stable and Patient moving all extremities X 4  Post vital signs: Reviewed and stable  Complications: No apparent anesthesia complications

## 2012-07-10 NOTE — Anesthesia Postprocedure Evaluation (Signed)
  Anesthesia Post-op Note  Patient: Joy Martin  Procedure(s) Performed: Procedure(s) (LRB): APPENDECTOMY LAPAROSCOPIC (N/A)  Patient Location: PACU  Anesthesia Type: General  Level of Consciousness: awake, alert  and oriented  Airway and Oxygen Therapy: Patient Spontanous Breathing and Patient connected to nasal cannula oxygen  Post-op Pain: mild  Post-op Assessment: Post-op Vital signs reviewed and Patient's Cardiovascular Status Stable  Post-op Vital Signs: stable  Complications: No apparent anesthesia complications

## 2012-07-10 NOTE — ED Notes (Signed)
To ED for eval of rlq pain for past day. No pain with urination but frequency. Pt states she feels no pain when sitting still but pain with movement. Denies vomiting or fevers

## 2012-07-10 NOTE — H&P (Signed)
Joy Martin is an 71 y.o. female.   Chief Complaint: RLQ abdominal pain HPI: 71 yo female presents with abdominal pain for 2 days.  Started as vague cramping centrally, now in constant and mildly sharp in the RLQ.  She denies nausea or vomiting.  BM's normal.  Pain does not refer any where else.  Past Medical History  Diagnosis Date  . Arthritis   . ALLERGIC RHINITIS, CHRONIC   . ANEMIA   . Episodic tension type headache   . Hypertension   . EXOGENOUS OBESITY   . GERD   . HYPERLIPIDEMIA   . OSTEOPENIA     Past Surgical History  Procedure Date  . Arthroscopic surgery right knee   . Breast surgery     Breast biopsy in  1980's  . Tonsillectomy 1946  . Knee surgery     torn cartilage in 1970's or 1980's  . Mole removal 1957  . Wisdom tooth extraction 1959  . Cataract extraction     Family History  Problem Relation Age of Onset  . Prostate cancer Father   . Alcohol abuse Other   . Heart disease Other   . Hypertension Other   . Diabetes Other   . Pancreatic cancer Other    Social History:  reports that she has quit smoking. She has never used smokeless tobacco. She reports that she drinks alcohol. She reports that she does not use illicit drugs.  Allergies:  Allergies  Allergen Reactions  . Allegra (Fexofenadine Hcl) Other (See Comments)    headache     (Not in a hospital admission)  Results for orders placed during the hospital encounter of 07/10/12 (from the past 48 hour(s))  CBC WITH DIFFERENTIAL     Status: Abnormal   Collection Time   07/10/12 11:20 AM      Component Value Range Comment   WBC 13.0 (*) 4.0 - 10.5 K/uL    RBC 3.78 (*) 3.87 - 5.11 MIL/uL    Hemoglobin 11.9 (*) 12.0 - 15.0 g/dL    HCT 16.1 (*) 09.6 - 46.0 %    MCV 93.7  78.0 - 100.0 fL    MCH 31.5  26.0 - 34.0 pg    MCHC 33.6  30.0 - 36.0 g/dL    RDW 04.5  40.9 - 81.1 %    Platelets 240  150 - 400 K/uL    Neutrophils Relative 85 (*) 43 - 77 %    Neutro Abs 11.0 (*) 1.7 - 7.7 K/uL    Lymphocytes Relative 9 (*) 12 - 46 %    Lymphs Abs 1.2  0.7 - 4.0 K/uL    Monocytes Relative 6  3 - 12 %    Monocytes Absolute 0.8  0.1 - 1.0 K/uL    Eosinophils Relative 0  0 - 5 %    Eosinophils Absolute 0.1  0.0 - 0.7 K/uL    Basophils Relative 0  0 - 1 %    Basophils Absolute 0.0  0.0 - 0.1 K/uL   COMPREHENSIVE METABOLIC PANEL     Status: Abnormal   Collection Time   07/10/12 11:20 AM      Component Value Range Comment   Sodium 138  135 - 145 mEq/L    Potassium 3.5  3.5 - 5.1 mEq/L    Chloride 100  96 - 112 mEq/L    CO2 30  19 - 32 mEq/L    Glucose, Bld 128 (*) 70 - 99 mg/dL  BUN 19  6 - 23 mg/dL    Creatinine, Ser 7.84  0.50 - 1.10 mg/dL    Calcium 69.6 (*) 8.4 - 10.5 mg/dL    Total Protein 7.2  6.0 - 8.3 g/dL    Albumin 3.3 (*) 3.5 - 5.2 g/dL    AST 21  0 - 37 U/L    ALT 15  0 - 35 U/L    Alkaline Phosphatase 86  39 - 117 U/L    Total Bilirubin 0.3  0.3 - 1.2 mg/dL    GFR calc non Af Amer 53 (*) >90 mL/min    GFR calc Af Amer 61 (*) >90 mL/min   URINALYSIS, ROUTINE W REFLEX MICROSCOPIC     Status: Normal   Collection Time   07/10/12 11:59 AM      Component Value Range Comment   Color, Urine YELLOW  YELLOW    APPearance CLEAR  CLEAR    Specific Gravity, Urine 1.007  1.005 - 1.030    pH 7.0  5.0 - 8.0    Glucose, UA NEGATIVE  NEGATIVE mg/dL    Hgb urine dipstick NEGATIVE  NEGATIVE    Bilirubin Urine NEGATIVE  NEGATIVE    Ketones, ur NEGATIVE  NEGATIVE mg/dL    Protein, ur NEGATIVE  NEGATIVE mg/dL    Urobilinogen, UA 0.2  0.0 - 1.0 mg/dL    Nitrite NEGATIVE  NEGATIVE    Leukocytes, UA NEGATIVE  NEGATIVE MICROSCOPIC NOT DONE ON URINES WITH NEGATIVE PROTEIN, BLOOD, LEUKOCYTES, NITRITE, OR GLUCOSE <1000 mg/dL.   Ct Abdomen Pelvis W Contrast  07/10/2012  *RADIOLOGY REPORT*  Clinical Data: Right lower abdominal pain  CT ABDOMEN AND PELVIS WITH CONTRAST  Technique:  Multidetector CT imaging of the abdomen and pelvis was performed following the standard protocol during bolus  administration of intravenous contrast.  Contrast: 80mL OMNIPAQUE IOHEXOL 300 MG/ML  SOLN  Comparison: None.  Findings: Visualized lung bases clear.  Unremarkable liver, gallbladder, spleen, adrenal glands, pancreas.  Moderate hiatal hernia.  Unremarkable kidneys.  No nephrolithiasis or hydronephrosis.  Mild patchy aortoiliac arterial plaque without aneurysm or stenosis.  Portal vein patent.  Stomach and small bowel are nondistended.  The appendix is distended to 11 mm diameter with adjacent inflammatory/edematous changes.  No extraluminal fluid collection or gas to suggest perforation or abscess.  The colon is nondilated. Urinary bladder physiologically distended.  Uterus and adnexal regions unremarkable.  No ascites.  No free air.  No pelvic, retroperitoneal, or mesenteric adenopathy.  Mild spondylitic changes in the lower lumbar spine.  IMPRESSION:  1.  Acute appendicitis without evidence of perforation or abscess. I telephoned the critical test results to Dr. Fredderick Phenix at the time of interpretation. 2.  Hiatal hernia.   Original Report Authenticated By: Osa Craver, M.D.     Review of Systems  Constitutional: Negative for fever and chills.  HENT: Negative for hearing loss.   Eyes: Negative.   Respiratory: Negative.  Negative for cough and shortness of breath.   Cardiovascular: Negative.  Negative for chest pain.  Gastrointestinal: Positive for abdominal pain. Negative for nausea, vomiting, diarrhea and constipation.  Genitourinary: Negative.   Musculoskeletal: Negative.   Skin: Negative.   Neurological: Negative.  Negative for headaches.  Endo/Heme/Allergies: Negative.   Psychiatric/Behavioral: Negative.     Blood pressure 148/54, pulse 81, temperature 98.8 F (37.1 C), temperature source Oral, resp. rate 20, SpO2 96.00%. Physical Exam  Constitutional: She is oriented to person, place, and time. She appears well-developed and  well-nourished. No distress.  HENT:  Head: Normocephalic  and atraumatic.  Right Ear: External ear normal.  Left Ear: External ear normal.  Nose: Nose normal.  Mouth/Throat: Oropharynx is clear and moist. No oropharyngeal exudate.  Eyes: Conjunctivae are normal. Pupils are equal, round, and reactive to light. No scleral icterus.  Neck: Normal range of motion. No tracheal deviation present.  Cardiovascular: Normal rate, regular rhythm, normal heart sounds and intact distal pulses.   No murmur heard. Respiratory: Effort normal and breath sounds normal. No respiratory distress.  GI: Soft. Bowel sounds are normal. She exhibits no distension. There is tenderness. There is guarding.       Mild tenderness with guarding in the RLQ  Musculoskeletal: Normal range of motion. She exhibits no edema and no tenderness.  Lymphadenopathy:    She has no cervical adenopathy.  Neurological: She is alert and oriented to person, place, and time.  Skin: Skin is warm and dry. No rash noted. She is not diaphoretic. No erythema.  Psychiatric: Her behavior is normal. Judgment normal.     Assessment/Plan Acute Appendicitis  Removal of the appendix is recommended.  I will try the laparoscopic approach.  I discussed the risks which include but are not limited to bleeding, infection, stump leak, injury to surrounding structures, need to convert to an open procedure, etc.  She understands and agrees to proceed.  Likelihood of success in good.  Preop antibiotics have been given.  Karelyn Brisby A 07/10/2012, 3:13 PM

## 2012-07-10 NOTE — ED Notes (Signed)
Back from CT

## 2012-07-10 NOTE — Op Note (Signed)
Appendectomy, Lap, Procedure Note  Indications: The patient presented with a history of right-sided abdominal pain. A CT revealed findings consistent with acute appendicitis.  Pre-operative Diagnosis: Acute appendicitis without mention of peritonitis  Post-operative Diagnosis: Same  Surgeon: Abigail Miyamoto A   Assistants: 0  Anesthesia: General endotracheal anesthesia  ASA Class: 2  Procedure Details  The patient was seen again in the Holding Room. The risks, benefits, complications, treatment options, and expected outcomes were discussed with the patient and/or family. The possibilities of reaction to medication, perforation of viscus, bleeding, recurrent infection, finding a normal appendix, the need for additional procedures, failure to diagnose a condition, and creating a complication requiring transfusion or operation were discussed. There was concurrence with the proposed plan and informed consent was obtained. The site of surgery was properly noted. The patient was taken to Operating Room, identified as Joy Martin and the procedure verified as Appendectomy. A Time Out was held and the above information confirmed.  The patient was placed in the supine position and general anesthesia was induced, along with placement of orogastric tube, Venodyne boots, and a Foley catheter. The abdomen was prepped and draped in a sterile fashion. A one centimeter infraumbilical incision was made.  The umbilical stalk was elevated, and the midline fascia was incised with a #11 blade.  A Kelly clamp was used to confirm entrance into the peritoneal cavity.  A pursestring suture was passed around the incision with a 0 Vicryl.  The Hasson was introduced into the abdomen and the tails of the suture were used to hold the Hasson in place.   The pneumoperitoneum was then established to steady pressure of 15 mmHg.  Additional 5 mm cannulas then placed in the left lower quadrant of the abdomen and the  suprapubic region under direct visualization. A careful evaluation of the entire abdomen was carried out. The patient was placed in Trendelenburg and left lateral decubitus position. The small intestines were retracted in the cephalad and left lateral direction away from the pelvis and right lower quadrant. The patient was found to have an enlarged and inflamed appendix that was extending into the pelvis. There was no evidence of perforation.  The appendix was carefully dissected. The appendix was was skeletonized with the harmonic scalpel.   The appendix was divided at its base using an endo-GIA stapler. Minimal appendiceal stump was left in place. There was no evidence of bleeding, leakage, or complication after division of the appendix. Irrigation was also performed and irrigate suctioned from the abdomen as well.  The umbilical port site was closed with the purse string suture. There was no residual palpable fascial defect.  The trocar site skin wounds were closed with 4-0 Monocryl.  Instrument, sponge, and needle counts were correct at the conclusion of the case.   Findings: The appendix was found to be inflamed. There were not signs of necrosis.  There was not perforation. There was not abscess formation.  Estimated Blood Loss:  Minimal                 Complications:  None; patient tolerated the procedure well.         Disposition: PACU - hemodynamically stable.         Condition: stable

## 2012-07-11 ENCOUNTER — Encounter (HOSPITAL_COMMUNITY): Payer: Self-pay | Admitting: Surgery

## 2012-07-11 MED ORDER — HYDROCODONE-ACETAMINOPHEN 5-325 MG PO TABS: 1.0000 | ORAL_TABLET | ORAL | Status: AC | PRN

## 2012-07-11 MED FILL — Hydromorphone HCl Preservative Free (PF) Inj 1 MG/ML: INTRAMUSCULAR | Qty: 1 | Status: AC

## 2012-07-11 NOTE — Discharge Summary (Signed)
Patient ID: Joy Martin MRN: [id number] DOB/AGE: 14-Aug-1941 71 y.o.  Admit date: 07/10/2012 Discharge date: 07/11/2012  Procedures: laparoscopic appendectomy  Consults: None  Reason for Admission: this is a 71 yo female who presented to [facility] with 2 day of abdominal pain.  She was found to have acute appendicitis.  Admission Diagnoses:  1. Acute appendicitis Patient Active Problem List  Diagnosis  . HYPERLIPIDEMIA  . EXOGENOUS OBESITY  . ANEMIA  . EPISODIC TENSION TYPE HEADACHE  . ESSENTIAL HYPERTENSION, BENIGN  . ALLERGIC RHINITIS, CHRONIC  . GERD  . ARTHRITIS  . OSTEOPENIA  . Postmenopausal symptoms     Hospital Course: the patient was admitted.  She was taken to the operating room for a lap appy.  She tolerated the procedure well and was tolerating solid food on POD# 1 and her pain was well controlled.  She was otherwise felts stable for discharge home.  PE: Abd: soft, appropriately tender, +BS, ND, incisions c/d/i  Discharge Diagnoses:  1. Acute appendicitis, s/p lap appy  Discharge Medications: Medication List  As of 07/11/2012  8:55 AM   TAKE these medications         alendronate 70 MG tablet   Commonly known as: FOSAMAX   Take 1 tablet (70 mg total) by mouth every 7 (seven) days. Take with a full glass of water on an empty stomach.      aspirin 81 MG tablet   Take 81 mg by mouth daily.      Black Cohosh 40 MG Caps   Take 1 capsule (40 mg total) by mouth 3 (three) times daily.      butalbital-aspirin-caffeine-codeine 50-325-40-30 MG capsule   Commonly known as: FIORINAL WITH CODEINE   Take 1 capsule by mouth every 4 (four) hours as needed. NOT EXCEED 4 PER DAY      CALCIUM 500 PO   Take by mouth.      cholecalciferol 1000 UNITS tablet   Commonly known as: VITAMIN D   Take 1,000 Units by mouth daily.      ciclopirox 0.77 % cream   Commonly known as: LOPROX   Apply 1 application topically 2 (two) times daily. rash      cycloSPORINE 0.05 % ophthalmic  emulsion   Commonly known as: RESTASIS   1 drop 2 (two) times daily.      esomeprazole 40 MG capsule   Commonly known as: NEXIUM   Take 1 capsule (40 mg total) by mouth 2 (two) times daily before a meal.      fish oil-omega-3 fatty acids 1000 MG capsule   Take 2 g by mouth daily.      FLAXSEED OIL PO   Take by mouth.      fluticasone 50 MCG/ACT nasal spray   Commonly known as: FLONASE   Place 1 spray into the nose daily.      HYDROcodone-acetaminophen 5-325 MG per tablet   Commonly known as: NORCO/VICODIN   Take 1-2 tablets by mouth every 4 (four) hours as needed.      multivitamin per tablet   Take 1 tablet by mouth daily.      nabumetone 750 MG tablet   Commonly known as: RELAFEN   Take 1 tablet (750 mg total) by mouth 2 (two) times daily as needed for pain.      nadolol 80 MG tablet   Commonly known as: CORGARD   Take 1 tablet (80 mg total) by mouth 2 (two) times daily.  simvastatin 40 MG tablet   Commonly known as: ZOCOR   Take 1 tablet (40 mg total) by mouth at bedtime.      valsartan-hydrochlorothiazide 160-12.5 MG per tablet   Commonly known as: DIOVAN-HCT   Take 1 tablet by mouth daily.            Discharge Instructions: Follow-up Information    Follow up with [facility] on 07/25/2012. (2:00pm, arrive at 1:45pm)    Contact information:   [address] [place], [place] [address] [phone]         Signed: Letha Cape 07/11/2012, 8:55 AM

## 2012-07-11 NOTE — Progress Notes (Signed)
Utilization review complete 

## 2012-07-11 NOTE — Discharge Summary (Signed)
She looks good and wounds okay.  She should be okay for discharge to home.

## 2012-07-11 NOTE — Progress Notes (Signed)
Discharge instructions/Med Rec Sheet reviewed w/ pt. Pt expressed understanding and copies given w/ prescriptions. Pt d/c'd in stable condition via w/c, accompanied by discharge volunteers 

## 2012-07-25 ENCOUNTER — Encounter (INDEPENDENT_AMBULATORY_CARE_PROVIDER_SITE_OTHER): Payer: Self-pay

## 2012-07-25 ENCOUNTER — Ambulatory Visit (INDEPENDENT_AMBULATORY_CARE_PROVIDER_SITE_OTHER): Payer: Medicare Other | Admitting: Internal Medicine

## 2012-07-25 VITALS — BP 168/94 | HR 58 | Temp 97.4°F | Ht 62.0 in | Wt 188.8 lb

## 2012-07-25 DIAGNOSIS — K3580 Unspecified acute appendicitis: Secondary | ICD-10-CM

## 2012-07-25 NOTE — Progress Notes (Signed)
Patient ID: Joy Martin, female   DOB: 1940-12-11, 71 y.o.   MRN: [id number]   Subjective: Pt returns to the clinic after undergoing lap appy on 9/2.  She has had no problems since discharge.  She is eating well and having no pain.    Objective: Vital signs in last 24 hours: reviewed  PE: Abd: soft, non tender, +bs, small retained suture is removed from the umbilical incision, no signs of infection  Lab Results:  No results found for this basename: WBC:2,HGB:2,HCT:2,PLT:2 in the last 72 hours BMET No results found for this basename: NA:2,K:2,CL:2,CO2:2,GLUCOSE:2,BUN:2,CREATININE:2,CALCIUM:2 in the last 72 hours PT/INR No results found for this basename: LABPROT:2,INR:2 in the last 72 hours CMP     Component Value Date/Time   NA 138 07/10/2012 1120   K 3.5 07/10/2012 1120   CL 100 07/10/2012 1120   CO2 30 07/10/2012 1120   GLUCOSE 128* 07/10/2012 1120   BUN 19 07/10/2012 1120   CREATININE 1.03 07/10/2012 1120   CALCIUM 10.6* 07/10/2012 1120   PROT 7.2 07/10/2012 1120   ALBUMIN 3.3* 07/10/2012 1120   AST 21 07/10/2012 1120   ALT 15 07/10/2012 1120   ALKPHOS 86 07/10/2012 1120   BILITOT 0.3 07/10/2012 1120   GFRNONAA 53* 07/10/2012 1120   GFRAA 61* 07/10/2012 1120   Lipase  No results found for this basename: lipase       Studies/Results: No results found.  Anti-infectives: Anti-infectives    None       Assessment/Plan  1.  PO Lap Appy 9/2: doing well, return to regular activity without restrictions.  Return PRN.    Gargi Berch 07/25/2012

## 2012-07-25 NOTE — Patient Instructions (Signed)
Return to regular activity without restrictions

## 2012-08-18 ENCOUNTER — Other Ambulatory Visit: Payer: Self-pay | Admitting: Internal Medicine

## 2012-08-18 NOTE — Telephone Encounter (Signed)
Last written 06/13/2012 #100 with 0 refills.

## 2012-08-22 ENCOUNTER — Other Ambulatory Visit: Payer: Self-pay | Admitting: Internal Medicine

## 2012-08-23 ENCOUNTER — Other Ambulatory Visit: Payer: Self-pay | Admitting: Internal Medicine

## 2012-10-03 ENCOUNTER — Other Ambulatory Visit: Payer: Self-pay | Admitting: Internal Medicine

## 2012-10-04 ENCOUNTER — Other Ambulatory Visit: Payer: Self-pay | Admitting: Internal Medicine

## 2012-10-25 ENCOUNTER — Other Ambulatory Visit (INDEPENDENT_AMBULATORY_CARE_PROVIDER_SITE_OTHER): Payer: Medicare Other

## 2012-10-25 ENCOUNTER — Encounter: Payer: Self-pay | Admitting: Internal Medicine

## 2012-10-25 ENCOUNTER — Ambulatory Visit (INDEPENDENT_AMBULATORY_CARE_PROVIDER_SITE_OTHER): Payer: Medicare Other | Admitting: Internal Medicine

## 2012-10-25 VITALS — BP 132/82 | HR 58 | Temp 98.0°F | Ht 62.0 in | Wt 187.4 lb

## 2012-10-25 DIAGNOSIS — R300 Dysuria: Secondary | ICD-10-CM

## 2012-10-25 DIAGNOSIS — E669 Obesity, unspecified: Secondary | ICD-10-CM

## 2012-10-25 DIAGNOSIS — I10 Essential (primary) hypertension: Secondary | ICD-10-CM

## 2012-10-25 DIAGNOSIS — R739 Hyperglycemia, unspecified: Secondary | ICD-10-CM

## 2012-10-25 DIAGNOSIS — R7309 Other abnormal glucose: Secondary | ICD-10-CM

## 2012-10-25 DIAGNOSIS — N959 Unspecified menopausal and perimenopausal disorder: Secondary | ICD-10-CM

## 2012-10-25 DIAGNOSIS — M899 Disorder of bone, unspecified: Secondary | ICD-10-CM

## 2012-10-25 LAB — URINALYSIS, ROUTINE W REFLEX MICROSCOPIC
Nitrite: NEGATIVE
Total Protein, Urine: NEGATIVE
pH: 6 (ref 5.0–8.0)

## 2012-10-25 LAB — BASIC METABOLIC PANEL
Chloride: 104 mEq/L (ref 96–112)
Potassium: 4.8 mEq/L (ref 3.5–5.1)
Sodium: 140 mEq/L (ref 135–145)

## 2012-10-25 LAB — HEMOGLOBIN A1C: Hgb A1c MFr Bld: 6.5 % (ref 4.6–6.5)

## 2012-10-25 MED ORDER — VENLAFAXINE HCL 37.5 MG PO TABS: 37.5000 mg | ORAL_TABLET | Freq: Two times a day (BID) | ORAL | Status: DC

## 2012-10-25 NOTE — Assessment & Plan Note (Signed)
BP Readings from Last 3 Encounters:  10/25/12 132/82  07/25/12 168/94  07/11/12 142/48   The current medical regimen is effective;  continue present plan and medications.  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

## 2012-10-25 NOTE — Assessment & Plan Note (Signed)
Wt Readings from Last 3 Encounters:  10/25/12 187 lb 6.4 oz (85.004 kg)  07/25/12 188 lb 12.8 oz (85.639 kg)  07/10/12 206 lb 11.2 oz (93.759 kg)   Weight trend reviewed Working with [employer] since 03/2012 The patient is asked to make an attempt to improve diet and exercise patterns to aid in medical management of this problem.

## 2012-10-25 NOTE — Assessment & Plan Note (Signed)
On calcium and fosamax added summer 2013 encouraged weight bearing exercise  DEXAs reviewed

## 2012-10-25 NOTE — Patient Instructions (Signed)
It was good to see you today. We have reviewed your prior records including labs and tests today Test(s) ordered today. Your results will be released to MyChart (or called to you) after review, usually within 72hours after test completion. If any changes need to be made, you will be notified at that same time. Medications reviewed, ok to try Estravera or effexor prescription for host flashes -no other changes at this time. Refill on medication(s) as discussed today. Your prescription(s) have been submitted to your pharmacy. Please take as directed and contact our office if you believe you are having problem(s) with the medication(s). Please schedule followup in 6-12 months for weight and blood pressure check, call sooner if problems.   Exercise to Lose Weight Exercise and a healthy diet may help you lose weight. Your doctor may suggest specific exercises. EXERCISE IDEAS AND TIPS  Choose low-cost things you enjoy doing, such as walking, bicycling, or exercising to workout videos.   Take stairs instead of the elevator.   Walk during your lunch break.   Park your car further away from work or school.   Go to a gym or an exercise class.   Start with 5 to 10 minutes of exercise each day. Build up to 30 minutes of exercise 4 to 6 days a week.   Wear shoes with good support and comfortable clothes.   Stretch before and after working out.   Work out until you breathe harder and your heart beats faster.   Drink extra water when you exercise.   Do not do so much that you hurt yourself, feel dizzy, or get very short of breath.  Exercises that burn about 150 calories:  Running 1  miles in 15 minutes.   Playing volleyball for 45 to 60 minutes.   Washing and waxing a car for 45 to 60 minutes.   Playing touch football for 45 minutes.   Walking 1  miles in 35 minutes.   Pushing a stroller 1  miles in 30 minutes.   Playing basketball for 30 minutes.   Raking leaves for 30 minutes.     Bicycling 5 miles in 30 minutes.   Walking 2 miles in 30 minutes.   Dancing for 30 minutes.   Shoveling snow for 15 minutes.   Swimming laps for 20 minutes.   Walking up stairs for 15 minutes.   Bicycling 4 miles in 15 minutes.   Gardening for 30 to 45 minutes.   Jumping rope for 15 minutes.   Washing windows or floors for 45 to 60 minutes.  Document Released: 11/27/2010 Document Revised: 01/17/2012 Document Reviewed: 11/27/2010 [facility] Patient Information 2013 [place], [place].

## 2012-10-25 NOTE — Progress Notes (Signed)
  Subjective:    Patient ID: Joy Martin, female    DOB: 07-Apr-1941, 71 y.o.   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 Denies adverse side effects.  Dyslipidemia - on statin for years - the patient reports compliance with medication(s) as prescribed. Denies adverse side effects.  GERD - on PPI bid since summer 2012  Past Medical History  Diagnosis Date  . ALLERGIC RHINITIS, CHRONIC   . ANEMIA   . Hypertension   . EXOGENOUS OBESITY   . GERD   . HYPERLIPIDEMIA   . OSTEOPENIA   . Episodic tension type headache   . Migraines 07/10/2012    "over; last one was @ age 51"  . Arthritis     "knees mainly; thumbs"    Review of Systems  Respiratory: Negative for cough and shortness of breath.   Cardiovascular: Negative for chest pain or palpitations.      Objective:   Physical Exam  BP 132/82  Pulse 58  Temp 98 F (36.7 C) (Oral)  Ht 5\' 2"  (1.575 m)  Wt 187 lb 6.4 oz (85.004 kg)  BMI 34.28 kg/m2  SpO2 98% Wt Readings from Last 3 Encounters:  10/25/12 187 lb 6.4 oz (85.004 kg)  07/25/12 188 lb 12.8 oz (85.639 kg)  07/10/12 206 lb 11.2 oz (93.759 kg)   Constitutional: She is overweight, but appears well-developed and well-nourished. No distress.  Neck: Thick, Normal range of motion. Neck supple. No JVD present. No thyromegaly present.  Cardiovascular: Normal rate, regular rhythm and normal heart sounds.  No murmur heard. No BLE edema. Pulmonary/Chest: Effort normal and breath sounds normal. No respiratory distress. She has no wheezes.  Abdomen: SNTND, +BS Psychiatric: She has a normal mood and affect. Her behavior is normal. Judgment and thought content normal.   Lab Results  Component Value Date   WBC 13.0* 07/10/2012   HGB 11.9* 07/10/2012   HCT 35.4*  07/10/2012   PLT 240 07/10/2012   GLUCOSE 128* 07/10/2012   CHOL 192 05/03/2012   TRIG 85.0 05/03/2012   HDL 81.90 05/03/2012   LDLDIRECT 143.2 12/19/2007   LDLCALC 93 05/03/2012   ALT 15 07/10/2012   AST 21 07/10/2012   NA 138 07/10/2012   K 3.5 07/10/2012   CL 100 07/10/2012   CREATININE 1.03 07/10/2012   BUN 19 07/10/2012   CO2 30 07/10/2012   TSH 1.55 05/03/2012   HGBA1C 6.0 12/13/2006       Assessment & Plan:  See problem list. Medications and labs reviewed today.   Random, mild hyperglycemia - will check a1c now and encouraged continued work on diet with WW Time spent with pt today 35 minutes, greater than 50% time spent counseling patient on hypertension, cholesterol and medication review. Also review of prior records

## 2012-10-25 NOTE — Assessment & Plan Note (Signed)
Exacerbated by stopping Estrace 2011 Black cohash ineffective - Ok to try OTC Estrovera If ineffective, start SNRI Effexor - erx done

## 2012-10-26 ENCOUNTER — Other Ambulatory Visit: Payer: Self-pay | Admitting: Internal Medicine

## 2012-10-27 NOTE — Telephone Encounter (Signed)
[facility].../lmb 

## 2012-11-27 ENCOUNTER — Encounter: Payer: Self-pay | Admitting: Internal Medicine

## 2012-12-20 ENCOUNTER — Other Ambulatory Visit: Payer: Self-pay | Admitting: Internal Medicine

## 2012-12-20 NOTE — Telephone Encounter (Signed)
Rx faxed to [facility].  

## 2013-01-09 ENCOUNTER — Encounter: Payer: Self-pay | Admitting: Internal Medicine

## 2013-01-09 ENCOUNTER — Ambulatory Visit (INDEPENDENT_AMBULATORY_CARE_PROVIDER_SITE_OTHER): Payer: 59 | Admitting: Internal Medicine

## 2013-01-09 ENCOUNTER — Other Ambulatory Visit: Payer: Self-pay | Admitting: Internal Medicine

## 2013-01-09 ENCOUNTER — Telehealth: Payer: Self-pay | Admitting: *Deleted

## 2013-01-09 ENCOUNTER — Ambulatory Visit (INDEPENDENT_AMBULATORY_CARE_PROVIDER_SITE_OTHER)
Admission: RE | Admit: 2013-01-09 | Discharge: 2013-01-09 | Disposition: A | Discharge status: Home / Self Care | Payer: 59 | Source: Ambulatory Visit | Attending: Internal Medicine | Admitting: Internal Medicine

## 2013-01-09 VITALS — BP 142/82 | HR 62 | Temp 97.8°F

## 2013-01-09 DIAGNOSIS — Z87891 Personal history of nicotine dependence: Secondary | ICD-10-CM

## 2013-01-09 DIAGNOSIS — R918 Other nonspecific abnormal finding of lung field: Secondary | ICD-10-CM

## 2013-01-09 DIAGNOSIS — R05 Cough: Secondary | ICD-10-CM

## 2013-01-09 MED ORDER — HYDROCODONE-HOMATROPINE 5-1.5 MG/5ML PO SYRP: 5.0000 mL | ORAL_SOLUTION | Freq: Four times a day (QID) | ORAL | Status: DC | PRN

## 2013-01-09 MED ORDER — PREDNISONE (PAK) 10 MG PO TABS: 10.0000 mg | ORAL_TABLET | ORAL | Status: DC

## 2013-01-09 NOTE — Telephone Encounter (Signed)
Spoke with patient about results of chest xray. Informed pt of need for CT scan. All question answered.

## 2013-01-09 NOTE — Telephone Encounter (Signed)
Joy Martin,  CT ordered, she should be contacted by [facility] shortly. Joy Martin

## 2013-01-09 NOTE — Patient Instructions (Signed)
It was good to see you today. We have reviewed your prior records including labs and tests today Test(s) ordered today. Your results will be released to MyChart (or called to you) after review, usually within 72hours after test completion. If any changes need to be made, you will be notified at that same time. If you develop worsening symptoms or fever, call and we can reconsider antibiotics, but it does not appear necessary to use antibiotics at this time. Take prednisone taper for the next 6 days and Hydromet syrup as needed for cough -Your prescription(s) have been submitted to your pharmacy. Please take as directed and contact our office if you believe you are having problem(s) with the medication(s).

## 2013-01-09 NOTE — Progress Notes (Signed)
  Subjective:    Patient ID: Joy Martin, female    DOB: 08/21/1941, 72 y.o.   MRN: [id number]  HPI  complains of cough Onset 3 weeks ago Initially productive, now dry symptoms worse during day than night No fever, no weight loss, no recent travel No hemoptysis, chest pain or sinus drainage Remote smoker, quit in the 80s -reports 20 year pack history  Past Medical History  Diagnosis Date  . ALLERGIC RHINITIS, CHRONIC   . ANEMIA   . Hypertension   . EXOGENOUS OBESITY   . GERD   . HYPERLIPIDEMIA   . OSTEOPENIA   . Episodic tension type headache   . Migraines 07/10/2012    "over; last one was @ age 35"  . Arthritis     "knees mainly; thumbs"     Review of Systems  Constitutional: Negative for fever, fatigue and unexpected weight change.  Respiratory: Positive for cough. Negative for chest tightness and wheezing.   Cardiovascular: Negative for chest pain, palpitations and leg swelling.  Neurological: Negative for dizziness and headaches.       Objective:   Physical Exam BP 142/82  Pulse 62  Temp(Src) 97.8 F (36.6 C) (Oral)  SpO2 95% Wt Readings from Last 3 Encounters:  10/25/12 187 lb 6.4 oz (85.004 kg)  07/25/12 188 lb 12.8 oz (85.639 kg)  07/10/12 206 lb 11.2 oz (93.759 kg)   Constitutional: She appears well-developed and well-nourished. No distress.  HENT: Head: Normocephalic and atraumatic. Ears: B TMs ok, no erythema or effusion; Nose: Nose normal. Mouth/Throat: Oropharynx is clear and moist. No oropharyngeal exudate.  Eyes: Conjunctivae and EOM are normal. Pupils are equal, round, and reactive to light. No scleral icterus.  Neck: Normal range of motion. Neck supple. No JVD present. No thyromegaly present.  Cardiovascular: Normal rate, regular rhythm and normal heart sounds.  No murmur heard. No BLE edema. Pulmonary/Chest: Effort normal and breath sounds normal. Cough triggered by deep inspiration. No respiratory distress. She has no wheezes.  Skin: Skin is  warm and dry. No rash noted. No erythema.  Psychiatric: She has a normal mood and affect. Her behavior is normal. Judgment and thought content normal.   Lab Results  Component Value Date   WBC 13.0* 07/10/2012   HGB 11.9* 07/10/2012   HCT 35.4* 07/10/2012   PLT 240 07/10/2012   GLUCOSE 105* 10/25/2012   CHOL 192 05/03/2012   TRIG 85.0 05/03/2012   HDL 81.90 05/03/2012   LDLDIRECT 143.2 12/19/2007   LDLCALC 93 05/03/2012   ALT 15 07/10/2012   AST 21 07/10/2012   NA 140 10/25/2012   K 4.8 10/25/2012   CL 104 10/25/2012   CREATININE 1.1 10/25/2012   BUN 27* 10/25/2012   CO2 30 10/25/2012   TSH 1.55 05/03/2012   HGBA1C 6.5 10/25/2012        Assessment & Plan:  Cough >3 weeks - suspect post viral syndrome Remote tobacco - quit age 81s  Check CXR now rule out mass or infiltrate pred pak and hydromet syrup  Other tx or eval to depend on symptoms response and results

## 2013-01-09 NOTE — Telephone Encounter (Signed)
Left msg on vm calling with results of cxr, also faxing over report.  Bronchitic changes and scattered atelectasis with right upper lobe mass. CT with contrast is recommended to exclude malignancy

## 2013-01-10 NOTE — Addendum Note (Signed)
Addended by: Lorre Munroe on: 01/10/2013 11:07 AM   Modules accepted: Orders

## 2013-01-11 ENCOUNTER — Other Ambulatory Visit (INDEPENDENT_AMBULATORY_CARE_PROVIDER_SITE_OTHER): Payer: 59

## 2013-01-11 DIAGNOSIS — R05 Cough: Secondary | ICD-10-CM

## 2013-01-11 LAB — BASIC METABOLIC PANEL
BUN: 27 mg/dL — ABNORMAL HIGH (ref 6–23)
CO2: 29 mEq/L (ref 19–32)
Calcium: 10.8 mg/dL — ABNORMAL HIGH (ref 8.4–10.5)
Creatinine, Ser: 1.2 mg/dL (ref 0.4–1.2)

## 2013-01-17 ENCOUNTER — Ambulatory Visit (INDEPENDENT_AMBULATORY_CARE_PROVIDER_SITE_OTHER)
Admission: RE | Admit: 2013-01-17 | Discharge: 2013-01-17 | Disposition: A | Discharge status: Home / Self Care | Payer: Medicare Other | Source: Ambulatory Visit | Attending: Internal Medicine | Admitting: Internal Medicine

## 2013-01-17 DIAGNOSIS — R222 Localized swelling, mass and lump, trunk: Secondary | ICD-10-CM

## 2013-01-17 DIAGNOSIS — R918 Other nonspecific abnormal finding of lung field: Secondary | ICD-10-CM

## 2013-01-17 MED ORDER — IOHEXOL 300 MG/ML  SOLN
80.0000 mL | Freq: Once | INTRAMUSCULAR | Status: AC | PRN
  Administered 2013-01-17: 80 mL via INTRAVENOUS

## 2013-01-19 ENCOUNTER — Encounter: Payer: Self-pay | Admitting: Internal Medicine

## 2013-01-19 DIAGNOSIS — R918 Other nonspecific abnormal finding of lung field: Secondary | ICD-10-CM

## 2013-01-26 ENCOUNTER — Encounter: Payer: Self-pay | Admitting: Internal Medicine

## 2013-01-26 ENCOUNTER — Ambulatory Visit (INDEPENDENT_AMBULATORY_CARE_PROVIDER_SITE_OTHER): Payer: Medicare Other | Admitting: Internal Medicine

## 2013-01-26 VITALS — BP 124/72 | HR 69 | Temp 97.1°F | Ht 63.0 in | Wt 179.0 lb

## 2013-01-26 DIAGNOSIS — R918 Other nonspecific abnormal finding of lung field: Secondary | ICD-10-CM

## 2013-01-26 DIAGNOSIS — R9389 Abnormal findings on diagnostic imaging of other specified body structures: Secondary | ICD-10-CM

## 2013-01-26 DIAGNOSIS — J309 Allergic rhinitis, unspecified: Secondary | ICD-10-CM

## 2013-01-26 DIAGNOSIS — R05 Cough: Secondary | ICD-10-CM

## 2013-01-26 MED ORDER — AMOXICILLIN-POT CLAVULANATE 875-125 MG PO TABS: 1.0000 | ORAL_TABLET | Freq: Two times a day (BID) | ORAL | Status: DC

## 2013-01-26 NOTE — Progress Notes (Signed)
Subjective:    Patient ID: Joy Martin, female    DOB: June 12, 1941  MRN: [id number]  HPI  28 yowf quit smoking aournd 1980 with no sequelae then abuptly ill mid Feb 2014 with "flu/cold" referred by Dr Felicity Coyer for abn ct   01/26/2013 1st pulmonary eval cc acute onset cough prod green mucus daytime > night assoc with with purulent nasal discharge and RUL infiltrate on cxr.  Symptoms have persisted about the same since onset assoc with mild doe. Some better cough with otcs  No obvious daytime variabilty or assoc   cp or chest tightness, subjective wheeze overt  r hb symptoms. No unusual exp hx or h/o childhood pna/ asthma or premature birth to her knowledge.   Sleeping ok without nocturnal  or early am exacerbation  of respiratory  c/o's or need for noct saba. Also denies any obvious fluctuation of symptoms with weather or environmental changes or other aggravating or alleviating factors except as outlined above     Review of Systems  Constitutional: Positive for activity change, appetite change, fatigue and unexpected weight change. Negative for fever, chills and diaphoresis.  HENT: Positive for hearing loss, congestion, rhinorrhea, neck pain, neck stiffness, postnasal drip and tinnitus. Negative for ear pain, nosebleeds, sore throat, facial swelling, sneezing, mouth sores, trouble swallowing, dental problem, voice change, sinus pressure and ear discharge.   Eyes: Positive for discharge, itching and visual disturbance. Negative for photophobia.  Respiratory: Positive for cough, choking and shortness of breath. Negative for apnea, chest tightness, wheezing and stridor.   Cardiovascular: Negative for chest pain, palpitations and leg swelling.  Gastrointestinal: Positive for constipation. Negative for nausea, vomiting, abdominal pain, blood in stool and abdominal distention.  Genitourinary: Negative for dysuria, urgency, frequency, hematuria, flank pain, decreased urine volume and difficulty  urinating.  Musculoskeletal: Positive for myalgias, back pain and arthralgias. Negative for joint swelling and gait problem.  Skin: Negative for color change, pallor and rash.  Neurological: Positive for headaches. Negative for dizziness, tremors, seizures, syncope, speech difficulty, weakness, light-headedness and numbness.  Hematological: Negative for adenopathy. Bruises/bleeds easily.  Psychiatric/Behavioral: Positive for sleep disturbance. Negative for confusion and agitation. The patient is nervous/anxious.        Objective:   Physical Exam Wt Readings from Last 3 Encounters:  01/26/13 179 lb (81.194 kg)  10/25/12 187 lb 6.4 oz (85.004 kg)  07/25/12 188 lb 12.8 oz (85.639 kg)     Nasal toned amb wf nad  HEENT: nl dentition, and orophanx. Nl external ear canals without cough reflex, mod bilateral non-specific turbinate edema   NECK :  without JVD/Nodes/TM/ nl carotid upstrokes bilaterally   LUNGS: no acc muscle use, clear to A and P bilaterally without cough on insp or exp maneuvers   CV:  RRR  no s3 or murmur or increase in P2, no edema   ABD:  soft and nontender with nl excursion in the supine position. No bruits or organomegaly, bowel sounds nl  MS:  warm without deformities, calf tenderness, cyanosis or clubbing  SKIN: warm and dry without lesions    NEURO:  alert, approp, no deficits    01/09/13 CT Chest 1. The abnormality in the apex of the right upper lobe on the  recent chest radiograph appears to be part of a generalized process  in the lungs bilaterally, as detailed above. 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 No definite  suspicious-appearing nodule or mass is noted  at this time to  strongly suggest the presence of a neoplasm.  2. Moderate size hiatal hernia.        Assessment & Plan:

## 2013-01-26 NOTE — Patient Instructions (Addendum)
augmentin 875 twice daily x 10 days,  bfast and supper and yogurt for lunch   Flonase has  no immediate benefit in terms of improving symptoms.  To help them reached the target tissue, the patient should use Afrin two puffs every 12 hours applied one min before using the nasal steroids.  Afrin should be stopped after no more than 5 days.  If the symptoms worsen, Afrin can be restarted after 5 days off of therapy to prevent rebound congestion from overuse of Afrin.  I also emphasized that in no way are nasal steroids a concern in terms of "addiction".   GERD (REFLUX)  is an extremely common cause of respiratory symptoms, many times with no significant heartburn at all.    It can be treated with medication, but also with lifestyle changes including avoidance of late meals, excessive alcohol, smoking cessation, and avoid fatty foods, chocolate, peppermint, colas, red wine, and acidic juices such as orange juice.  NO MINT OR MENTHOL PRODUCTS SO NO COUGH DROPS  USE SUGARLESS CANDY INSTEAD (jolley ranchers or Stover's)  NO OIL BASED VITAMINS - use powdered substitutes.    Please schedule a follow up office visit in 2 weeks, sooner if needed with cxr on return

## 2013-01-27 DIAGNOSIS — R9389 Abnormal findings on diagnostic imaging of other specified body structures: Secondary | ICD-10-CM | POA: Insufficient documentation

## 2013-01-27 NOTE — Assessment & Plan Note (Signed)
Since changes are apparent on plain cxr will use serial cxr to define radiographic baseline and progression or resolution of the diffuse pattern seen on CT Chest

## 2013-01-27 NOTE — Assessment & Plan Note (Signed)
Reviewed optimal topical treatment - See instructions for specific recommendations which were reviewed directly with the patient who was given a copy with highlighter outlining the key components.

## 2013-01-27 NOTE — Assessment & Plan Note (Signed)
?   If cough is related to the cxr and ct which have more of a chronic appearance and may have underlying MAI as suggested by radiology whereas Joy Martin present illness clearly is a least partly related to sinus dz which MAI does not cause.  The clinical syndrome is most c/w an Upper airway cough syndrome, so named because it's frequently impossible to sort out how much is  CR/sinusitis with freq throat clearing (which can be related to primary GERD)   vs  causing  secondary (" extra esophageal")  GERD from wide swings in gastric pressure that occur with throat clearing, often  promoting self use of mint and menthol lozenges that reduce the lower esophageal sphincter tone and exacerbate the problem further in a cyclical fashion.   These are the same pts (now being labeled as having "irritable larynx syndrome" by some cough centers) who not infrequently have a history of having failed to tolerate ace inhibitors,  dry powder inhalers or biphosphonates or report having atypical reflux symptoms that don't respond to standard doses of PPI , and are easily confused as having aecopd or asthma flares by even experienced allergists/ pulmonologists.   For now rx as sinus dz/ gerd and bring Joy Martin back to f/u the abn on cxr p address the acute symptoms

## 2013-02-09 ENCOUNTER — Ambulatory Visit (INDEPENDENT_AMBULATORY_CARE_PROVIDER_SITE_OTHER)
Admission: RE | Admit: 2013-02-09 | Discharge: 2013-02-09 | Disposition: A | Discharge status: Home / Self Care | Payer: Medicare Other | Source: Ambulatory Visit | Attending: Internal Medicine | Admitting: Internal Medicine

## 2013-02-09 ENCOUNTER — Encounter: Payer: Self-pay | Admitting: Internal Medicine

## 2013-02-09 ENCOUNTER — Ambulatory Visit (INDEPENDENT_AMBULATORY_CARE_PROVIDER_SITE_OTHER): Payer: Medicare Other | Admitting: Internal Medicine

## 2013-02-09 VITALS — BP 120/80 | HR 56 | Temp 97.4°F | Ht 62.0 in | Wt 180.0 lb

## 2013-02-09 DIAGNOSIS — R9389 Abnormal findings on diagnostic imaging of other specified body structures: Secondary | ICD-10-CM

## 2013-02-09 DIAGNOSIS — J309 Allergic rhinitis, unspecified: Secondary | ICD-10-CM

## 2013-02-09 DIAGNOSIS — R918 Other nonspecific abnormal finding of lung field: Secondary | ICD-10-CM

## 2013-02-09 DIAGNOSIS — R05 Cough: Secondary | ICD-10-CM

## 2013-02-09 NOTE — Patient Instructions (Addendum)
Please see patient coordinator before you leave today  to schedule sinus CT  GERD (REFLUX)  is an extremely common cause of respiratory symptoms, many times with no significant heartburn at all.    It can be treated with medication, but also with lifestyle changes including avoidance of late meals, excessive alcohol, smoking cessation, and avoid fatty foods, chocolate, peppermint, colas, red wine, and acidic juices such as orange juice.  NO MINT OR MENTHOL PRODUCTS SO NO COUGH DROPS  USE SUGARLESS CANDY INSTEAD (jolley ranchers or Stover's)  NO OIL BASED VITAMINS - use powdered substitutes.   Please schedule a follow up office visit in 6 weeks, call sooner if needed with PFT's

## 2013-02-09 NOTE — Progress Notes (Signed)
Subjective:    Patient ID: Joy Martin, female    DOB: 09-Mar-1941  MRN: [id number]  HPI  65 yowf quit smoking around  1980 with no sequelae then abuptly ill mid Feb 2014 with "flu/cold" referred by Dr Joy Martin for abn ct   01/26/2013 1st pulmonary eval cc acute onset cough prod green mucus daytime > night assoc with with purulent nasal discharge and RUL infiltrate on cxr.  Symptoms have persisted about the same since onset assoc with mild doe. Some better cough with otcs. rec augmentin 875 twice daily x 10 days,  bfast and supper and yogurt for lunch  Flonase has  no immediate benefit in terms of improving symptoms.  To help them reached the target tissue, the patient should use Afrin two puffs every 12 hours  GERD diet    02/09/2013 f/u ov/Joy Martin cc cough almost gone, no more green mucus, not a lot better with afrin though in terms of nasal symptoms   No obvious daytime variabilty or assoc   cp or chest tightness, subjective wheeze overt  r hb symptoms. No unusual exp hx or h/o childhood pna/ asthma or premature birth to her knowledge.   Sleeping ok without nocturnal  or early am exacerbation  of respiratory  c/o's or need for noct saba. Also denies any obvious fluctuation of symptoms with weather or environmental changes or other aggravating or alleviating factors except as outlined above   ROS  The following are not active complaints unless bolded sore throat, dysphagia, dental problems, itching, sneezing,  nasal congestion or excess/ purulent secretions, ear ache,   fever, chills, sweats, unintended wt loss, pleuritic or exertional cp, hemoptysis,  orthopnea pnd or leg swelling, presyncope, palpitations, heartburn, abdominal pain, anorexia, nausea, vomiting, diarrhea  or change in bowel or urinary habits, change in stools or urine, dysuria,hematuria,  rash, arthralgias, visual complaints, headache, numbness weakness or ataxia or problems with walking or coordination,  change in  mood/affect or memory.             Objective:   Physical Exam   02/09/2013   180  Wt Readings from Last 3 Encounters:  01/26/13 179 lb (81.194 kg)  10/25/12 187 lb 6.4 oz (85.004 kg)  07/25/12 188 lb 12.8 oz (85.639 kg)     Nasal toned amb wf nad  HEENT: nl dentition, and orophanx. Nl external ear canals without cough reflex, mod bilateral non-specific turbinate edema   NECK :  without JVD/Nodes/TM/ nl carotid upstrokes bilaterally   LUNGS: no acc muscle use, clear to A and P bilaterally without cough on insp or exp maneuvers   CV:  RRR  no s3 or murmur or increase in P2, no edema   ABD:  soft and nontender with nl excursion in the supine position. No bruits or organomegaly, bowel sounds nl  MS:  warm without deformities, calf tenderness, cyanosis or clubbing  SKIN: warm and dry without lesions    NEURO:  alert, approp, no deficits    01/09/13 CT Chest 1. The abnormality in the apex of the right upper lobe on the  recent chest radiograph appears to be part of a generalized process  in the lungs bilaterally, as detailed above. 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 No definite  suspicious-appearing nodule or mass is noted at this time to  strongly suggest the presence of a neoplasm.  2. Moderate size hiatal hernia.  CXR  02/09/2013 :  1. Hyperinflated lungs.  2. Right upper lobe nodularity appears decreased. 3. Hiatal hernia.         Assessment & Plan:

## 2013-02-11 NOTE — Assessment & Plan Note (Signed)
cxr improved so most likely inflammatory, need f/u cxr at 3 months to be complete, placed in tickle file

## 2013-02-11 NOTE — Assessment & Plan Note (Signed)
Much better despite poorly controlled rhinitis > continue rx for gerd, needs baseline pfts due to previous smoking hx

## 2013-02-11 NOTE — Assessment & Plan Note (Signed)
Not improving with max topical rx : sinus ct next step

## 2013-02-14 ENCOUNTER — Other Ambulatory Visit: Payer: Self-pay | Admitting: Internal Medicine

## 2013-02-14 ENCOUNTER — Ambulatory Visit (INDEPENDENT_AMBULATORY_CARE_PROVIDER_SITE_OTHER)
Admission: RE | Admit: 2013-02-14 | Discharge: 2013-02-14 | Disposition: A | Discharge status: Home / Self Care | Payer: Medicare Other | Source: Ambulatory Visit | Attending: Internal Medicine | Admitting: Internal Medicine

## 2013-02-14 ENCOUNTER — Encounter: Payer: Self-pay | Admitting: Internal Medicine

## 2013-02-14 DIAGNOSIS — J324 Chronic pansinusitis: Secondary | ICD-10-CM

## 2013-02-14 DIAGNOSIS — R05 Cough: Secondary | ICD-10-CM

## 2013-02-14 NOTE — Progress Notes (Signed)
Quick Note:  Spoke with pt and notified of results per Dr. Sherene Sires. Pt verbalized understanding and denied any questions. Order was sent [facility] ______

## 2013-02-28 ENCOUNTER — Other Ambulatory Visit: Payer: Self-pay | Admitting: Internal Medicine

## 2013-03-01 NOTE — Telephone Encounter (Signed)
Faxed script bck to [facility]...lmb 

## 2013-03-15 ENCOUNTER — Other Ambulatory Visit: Payer: Self-pay | Admitting: Internal Medicine

## 2013-03-23 ENCOUNTER — Ambulatory Visit (INDEPENDENT_AMBULATORY_CARE_PROVIDER_SITE_OTHER): Payer: Medicare Other | Admitting: Internal Medicine

## 2013-03-23 ENCOUNTER — Encounter: Payer: Self-pay | Admitting: Internal Medicine

## 2013-03-23 VITALS — BP 120/80 | HR 60 | Temp 98.4°F | Ht 60.5 in | Wt 181.0 lb

## 2013-03-23 DIAGNOSIS — R9389 Abnormal findings on diagnostic imaging of other specified body structures: Secondary | ICD-10-CM

## 2013-03-23 DIAGNOSIS — J449 Chronic obstructive pulmonary disease, unspecified: Secondary | ICD-10-CM

## 2013-03-23 DIAGNOSIS — R059 Cough, unspecified: Secondary | ICD-10-CM

## 2013-03-23 DIAGNOSIS — R918 Other nonspecific abnormal finding of lung field: Secondary | ICD-10-CM

## 2013-03-23 DIAGNOSIS — R05 Cough: Secondary | ICD-10-CM

## 2013-03-23 LAB — PULMONARY FUNCTION TEST

## 2013-03-23 NOTE — Progress Notes (Signed)
Subjective:    Patient ID: Joy Martin, female    DOB: [birth year]  MRN: [id number]  HPI  60 yowf quit smoking around  1980 with no sequelae then abuptly ill mid Feb 2014 with "flu/cold" referred by Dr Felicity Coyer for abn ct   01/26/2013 1st pulmonary eval cc acute onset cough prod green mucus daytime > night assoc with with purulent nasal discharge and RUL infiltrate on cxr.  Symptoms have persisted about the same since onset assoc with mild doe. Some better cough with otcs. rec augmentin 875 twice daily x 10 days,  bfast and supper and yogurt for lunch  Flonase has  no immediate benefit in terms of improving symptoms.  To help them reached the target tissue, the patient should use Afrin two puffs every 12 hours  GERD diet    02/09/2013 f/u ov/Rashaan Wyles cc cough almost gone, no more green mucus, not a lot better with afrin though in terms of nasal symptoms rec gerd diet Sinus CT Pos > [name]  03/23/2013 f/u ov/Ricarda Atayde re chronic cough Chief Complaint  Patient presents with  . Follow-up    Breathing is unchanged. Reports SOB. Denies chest pain, chest tightness, coughing or wheezing.  only sob with exertion but really not limiting her.   No obvious daytime variabilty or assoc   cp or chest tightness, subjective wheeze overt   hb symptoms. No unusual exp hx or h/o childhood pna/ asthma or premature birth to her knowledge.   Sleeping ok without nocturnal  or early am exacerbation  of respiratory  c/o's or need for noct saba. Also denies any obvious fluctuation of symptoms with weather or environmental changes or other aggravating or alleviating factors except as outlined above     Current Medications, Allergies, Past Medical History, Past Surgical History, Family History, and Social History were reviewed in [employer] record.  ROS  The following are not active complaints unless bolded sore throat, dysphagia, dental problems, itching, sneezing,  nasal congestion or  excess/ purulent secretions, ear ache,   fever, chills, sweats, unintended wt loss, pleuritic or exertional cp, hemoptysis,  orthopnea pnd or leg swelling, presyncope, palpitations, heartburn, abdominal pain, anorexia, nausea, vomiting, diarrhea  or change in bowel or urinary habits, change in stools or urine, dysuria,hematuria,  rash, arthralgias, visual complaints, headache, numbness weakness or ataxia or problems with walking or coordination,  change in mood/affect or memory.                 Objective:   Physical Exam   02/09/2013   180  > 03/23/2013  181 Wt Readings from Last 3 Encounters:  01/26/13 179 lb (81.194 kg)  10/25/12 187 lb 6.4 oz (85.004 kg)  07/25/12 188 lb 12.8 oz (85.639 kg)     Less naasal toned amb wf nad  HEENT: nl dentition, and orophanx. Nl external ear canals without cough reflex, mod bilateral non-specific turbinate edema   NECK :  without JVD/Nodes/TM/ nl carotid upstrokes bilaterally   LUNGS: no acc muscle use, clear to A and P bilaterally without cough on insp or exp maneuvers   CV:  RRR  no s3 or murmur or increase in P2, no edema   ABD:  soft and nontender with nl excursion in the supine position. No bruits or organomegaly, bowel sounds nl  MS:  warm without deformities, calf tenderness, cyanosis or clubbing  SKIN: warm and dry without lesions    NEURO:  alert, approp, no deficits    01/09/13  CT Chest 1. The abnormality in the apex of the right upper lobe on the  recent chest radiograph appears to be part of a generalized process  in the lungs bilaterally, as detailed above. 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 No definite  suspicious-appearing nodule or mass is noted at this time to  strongly suggest the presence of a neoplasm.  2. Moderate size hiatal hernia.  CXR  02/09/2013 :   1. Hyperinflated lungs.  2. Right upper lobe nodularity appears  decreased. 3. Hiatal hernia.         Assessment & Plan:

## 2013-03-23 NOTE — Progress Notes (Signed)
PFT done today. 

## 2013-03-23 NOTE — Patient Instructions (Addendum)
Please schedule a follow up visit in 3 months but call sooner if needed with CXR   Ok to continue corgard for now but I would try one half twice daily

## 2013-03-24 NOTE — Assessment & Plan Note (Signed)
-   cxr 01/10/13 with RUL changes > CT 23/12/14 with generalized bilateral changes c/w ? MAI - CXR 02/09/13 improved > rec serial cxr's and follow clinical;y rather than serial ct's    F/u at mid July then twice yearly planned thereafter in absence of signicant cough or wt loss attributable to MAI using the concept that the punishment (FOB/ treatment for prob MAI)  Fits the crime ( prob MAI on CT).

## 2013-03-24 NOTE — Assessment & Plan Note (Addendum)
-   PFT's 03/23/2013 FEV1  1.51 (82%) ratio 69 no better p B2, DLCO 85%  As I explained to this patient in detail:  although there may be  Very mild copd present, it does not appear to be limiting activity tolerance any more than a set of worn tires limits someone from driving a car  around a parking lot.  A new set of Michelins might look good but would have no perceived impact on the performance of the car and would not be worth the cost.  Therefore do not rec adding any inhalers at present  If asthmatic component develops, before I would add beta agonists I would strongly favor changing corgar to more specific beta blocker like bisoprolol or bystolic

## 2013-04-04 ENCOUNTER — Encounter: Payer: Self-pay | Admitting: Internal Medicine

## 2013-04-19 ENCOUNTER — Other Ambulatory Visit: Payer: Self-pay | Admitting: Internal Medicine

## 2013-04-23 ENCOUNTER — Other Ambulatory Visit: Payer: Self-pay | Admitting: Internal Medicine

## 2013-04-23 NOTE — Telephone Encounter (Signed)
Rx faxed to [facility].  

## 2013-05-02 ENCOUNTER — Encounter: Payer: Self-pay | Admitting: Internal Medicine

## 2013-05-02 ENCOUNTER — Ambulatory Visit (INDEPENDENT_AMBULATORY_CARE_PROVIDER_SITE_OTHER): Payer: Medicare Other | Admitting: Internal Medicine

## 2013-05-02 ENCOUNTER — Other Ambulatory Visit (INDEPENDENT_AMBULATORY_CARE_PROVIDER_SITE_OTHER): Payer: Medicare Other

## 2013-05-02 VITALS — BP 132/82 | HR 58 | Temp 97.5°F | Wt 182.8 lb

## 2013-05-02 DIAGNOSIS — M778 Other enthesopathies, not elsewhere classified: Secondary | ICD-10-CM

## 2013-05-02 DIAGNOSIS — J449 Chronic obstructive pulmonary disease, unspecified: Secondary | ICD-10-CM

## 2013-05-02 DIAGNOSIS — I10 Essential (primary) hypertension: Secondary | ICD-10-CM

## 2013-05-02 DIAGNOSIS — E785 Hyperlipidemia, unspecified: Secondary | ICD-10-CM

## 2013-05-02 DIAGNOSIS — M65839 Other synovitis and tenosynovitis, unspecified forearm: Secondary | ICD-10-CM

## 2013-05-02 LAB — BASIC METABOLIC PANEL
BUN: 20 mg/dL (ref 6–23)
Chloride: 101 mEq/L (ref 96–112)
Glucose, Bld: 86 mg/dL (ref 70–99)
Potassium: 4.8 mEq/L (ref 3.5–5.1)

## 2013-05-02 MED ORDER — VALSARTAN-HYDROCHLOROTHIAZIDE 320-12.5 MG PO TABS: 1.0000 | ORAL_TABLET | Freq: Every day | ORAL | Status: DC

## 2013-05-02 MED ORDER — NADOLOL 40 MG PO TABS: 40.0000 mg | ORAL_TABLET | Freq: Two times a day (BID) | ORAL | Status: DC

## 2013-05-02 NOTE — Progress Notes (Signed)
  Subjective:    Patient ID: Joy Martin, female    DOB: 28-Jun-1941, 72 y.o.   MRN: [id number]  HPI  Here for follow up - reviewed chronic medical issues today and interval medical events Also several medical questions  Notes R wrist pain x 2 weeks - no trauma, swelling or joint pain, different than usual OA and not improved with usual OA NSAID rx  Past Medical History  Diagnosis Date  . ALLERGIC RHINITIS, CHRONIC   . ANEMIA   . Hypertension   . EXOGENOUS OBESITY   . GERD   . HYPERLIPIDEMIA   . OSTEOPENIA   . Episodic tension type headache   . Migraines 07/10/2012    "over; last one was @ age 51"  . Arthritis     "knees mainly; thumbs"    Review of Systems  Respiratory: Negative for cough and shortness of breath.   Cardiovascular: Negative for chest pain or palpitations.      Objective:   Physical Exam  BP 132/82  Pulse 58  Temp(Src) 97.5 F (36.4 C) (Oral)  Wt 182 lb 12.8 oz (82.918 kg)  BMI 35.1 kg/m2  SpO2 98% Wt Readings from Last 3 Encounters:  05/02/13 182 lb 12.8 oz (82.918 kg)  03/23/13 181 lb (82.101 kg)  02/09/13 180 lb (81.647 kg)   Constitutional: She is overweight, but appears well-developed and well-nourished. No distress.  Neck: Thick, Normal range of motion. Neck supple. No JVD present. No thyromegaly present.  Cardiovascular: Normal rate, regular rhythm and normal heart sounds.  No murmur heard. No BLE edema. Pulmonary/Chest: Effort normal and breath sounds normal. No respiratory distress. She has no wheezes.  Musculoskeletal: R wrist: Positive Finkelstein. Pain in 1st dorsal compartment. Pain with flexion and extension of thumb. No significant swelling, no redness or signs of infection.  Skin: Skin is warm and dry. No rash noted. No erythema.  Psychiatric: normal mood and affect. behavior is normal. Judgment and thought content normal.     Lab Results  Component Value Date   WBC 13.0* 07/10/2012   HGB 11.9* 07/10/2012   HCT 35.4* 07/10/2012   PLT 240 07/10/2012   GLUCOSE 130* 01/11/2013   CHOL 192 05/03/2012   TRIG 85.0 05/03/2012   HDL 81.90 05/03/2012   LDLDIRECT 143.2 12/19/2007   LDLCALC 93 05/03/2012   ALT 15 07/10/2012   AST 21 07/10/2012   NA 138 01/11/2013   K 4.7 01/11/2013   CL 100 01/11/2013   CREATININE 1.2 01/11/2013   BUN 27* 01/11/2013   CO2 29 01/11/2013   TSH 1.55 05/03/2012   HGBA1C 6.5 10/25/2012       Assessment & Plan:  See problem list. Medications and labs reviewed today.  R wrist pain - exam and hx consistent with tendonitis - advised on education and treatment - ICE, rest and continue NSAID - to call if worse or unimproved

## 2013-05-02 NOTE — Patient Instructions (Addendum)
It was good to see you today. We have reviewed your prior records including labs and tests today Okay to continue reduced dose of nadolol - new prescription for 40 mg tablets provided To control your blood pressure with this medication reduction, will increase dose of Diovan HCT - take 320 mg/12.5 once daily Blood work today to monitor kidney function, will repeat in 2 weeks to monitor after medication change - will be called with these results and notified if any additional medication changes are needed For your wrist pain, apply ice to the painful area 3 times daily for 5 minutes, also rest and avoid overuse/activity for the next 2 weeks - call for referral to orthopedics if symptoms worse or unimproved Followup in 6 months for routine review of blood pressure check, please call sooner if problemsTendinitis Tendinitis is swelling and inflammation of the tendons. Tendons are band-like tissues that connect muscle to bone. Tendinitis commonly occurs in the:   Shoulders (rotator cuff).  Heels (Achilles tendon).  Elbows (triceps tendon). CAUSES Tendinitis is usually caused by overusing the tendon, muscles, and joints involved. When the tissue surrounding a tendon (synovium) becomes inflamed, it is called tenosynovitis. Tendinitis commonly develops in people whose jobs require repetitive motions. SYMPTOMS  Pain.  Tenderness.  Mild swelling. DIAGNOSIS Tendinitis is usually diagnosed by physical exam. Your caregiver may also order X-rays or other imaging tests. TREATMENT Your caregiver may recommend certain medicines or exercises for your treatment. HOME CARE INSTRUCTIONS   Use a sling or splint for as long as directed by your caregiver until the pain decreases.  Put ice on the injured area.  Put ice in a plastic bag.  Place a towel between your skin and the bag.  Leave the ice on for 15-20 minutes, 3-4 times a day.  Avoid using the limb while the tendon is painful. Perform gentle  range of motion exercises only as directed by your caregiver. Stop exercises if pain or discomfort increase, unless directed otherwise by your caregiver.  Only take over-the-counter or prescription medicines for pain, discomfort, or fever as directed by your caregiver. SEEK MEDICAL CARE IF:   Your pain and swelling increase.  You develop new, unexplained symptoms, especially increased numbness in the hands. MAKE SURE YOU:   Understand these instructions.  Will watch your condition.  Will get help right away if you are not doing well or get worse. Document Released: 10/22/2000 Document Revised: 01/17/2012 Document Reviewed: 01/11/2011 [facility] Patient Information 2014 [place], [place].

## 2013-05-02 NOTE — Assessment & Plan Note (Signed)
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

## 2013-05-02 NOTE — Assessment & Plan Note (Signed)
Evaluation by pulmonary spring 2014 with mild COPD on PFTs Cough symptoms improve, no daily therapy recommended by pulmonary

## 2013-05-03 NOTE — Assessment & Plan Note (Signed)
On statin - check annually Last lipids reviewed 

## 2013-05-16 ENCOUNTER — Other Ambulatory Visit (INDEPENDENT_AMBULATORY_CARE_PROVIDER_SITE_OTHER): Payer: Medicare Other

## 2013-05-16 DIAGNOSIS — E785 Hyperlipidemia, unspecified: Secondary | ICD-10-CM

## 2013-05-16 DIAGNOSIS — I10 Essential (primary) hypertension: Secondary | ICD-10-CM

## 2013-05-16 LAB — BASIC METABOLIC PANEL
CO2: 36 mEq/L — ABNORMAL HIGH (ref 19–32)
Calcium: 10.5 mg/dL (ref 8.4–10.5)
Creatinine, Ser: 1.2 mg/dL (ref 0.4–1.2)
Glucose, Bld: 99 mg/dL (ref 70–99)

## 2013-05-16 LAB — LIPID PANEL: Total CHOL/HDL Ratio: 3

## 2013-06-05 ENCOUNTER — Telehealth: Payer: Self-pay | Admitting: *Deleted

## 2013-06-05 NOTE — Telephone Encounter (Signed)
Message copied by Christen Butter on Tue Jun 05, 2013  9:19 AM ------      Message from: Nyoka Cowden      Created: Sun Feb 11, 2013  5:27 PM       Needs f/u cxr this mo re lung nodules ------

## 2013-06-05 NOTE — Telephone Encounter (Signed)
Pt scheduled for ov with cxr 06/28/13

## 2013-06-12 ENCOUNTER — Other Ambulatory Visit: Payer: Self-pay | Admitting: Internal Medicine

## 2013-06-12 NOTE — Telephone Encounter (Signed)
[facility].../lmb 

## 2013-06-27 ENCOUNTER — Other Ambulatory Visit: Payer: Self-pay | Admitting: Internal Medicine

## 2013-06-27 DIAGNOSIS — R05 Cough: Secondary | ICD-10-CM

## 2013-06-27 DIAGNOSIS — J449 Chronic obstructive pulmonary disease, unspecified: Secondary | ICD-10-CM

## 2013-06-28 ENCOUNTER — Ambulatory Visit (INDEPENDENT_AMBULATORY_CARE_PROVIDER_SITE_OTHER)
Admission: RE | Admit: 2013-06-28 | Discharge: 2013-06-28 | Disposition: A | Discharge status: Home / Self Care | Payer: Medicare Other | Source: Ambulatory Visit | Attending: Internal Medicine | Admitting: Internal Medicine

## 2013-06-28 ENCOUNTER — Ambulatory Visit (INDEPENDENT_AMBULATORY_CARE_PROVIDER_SITE_OTHER): Payer: Medicare Other | Admitting: Internal Medicine

## 2013-06-28 ENCOUNTER — Encounter: Payer: Self-pay | Admitting: Internal Medicine

## 2013-06-28 VITALS — BP 120/72 | HR 63 | Temp 97.8°F | Ht 62.0 in | Wt 180.8 lb

## 2013-06-28 DIAGNOSIS — R05 Cough: Secondary | ICD-10-CM

## 2013-06-28 DIAGNOSIS — R9389 Abnormal findings on diagnostic imaging of other specified body structures: Secondary | ICD-10-CM

## 2013-06-28 DIAGNOSIS — R918 Other nonspecific abnormal finding of lung field: Secondary | ICD-10-CM

## 2013-06-28 DIAGNOSIS — J449 Chronic obstructive pulmonary disease, unspecified: Secondary | ICD-10-CM

## 2013-06-28 DIAGNOSIS — J4489 Other specified chronic obstructive pulmonary disease: Secondary | ICD-10-CM

## 2013-06-28 DIAGNOSIS — J309 Allergic rhinitis, unspecified: Secondary | ICD-10-CM

## 2013-06-28 NOTE — Patient Instructions (Addendum)
I emphasized that nasal steroids have no immediate benefit in terms of improving symptoms.  To help them reached the target tissue, the patient should use Afrin two puffs every 12 hours applied one min before using the nasal steroids.  Afrin should be stopped after no more than 5 days.  If the symptoms worsen, Afrin can be restarted after 5 days off of therapy to prevent rebound congestion from overuse of Afrin.  I also emphasized that in no way are nasal steroids a concern in terms of "addiction".   If sinus problems don't improve you may need to see Dr [name] again but I will defer that issue to you and Dr Murtis Sink do have extremely mild copd from smoking but your lungs function great and no need for any medications for this   Pulmonary follow up is as needed

## 2013-06-28 NOTE — Progress Notes (Signed)
Subjective:    Patient ID: Joy Martin, female    DOB: Mar 19, 1941  MRN: [id number]    Brief patient profile:  72 yowf quit smoking around  1980 with no sequelae then abuptly ill mid Feb 2014 with "flu/cold" referred by Dr Felicity Coyer for abn ct  HPI 01/26/2013 1st pulmonary eval cc acute onset cough x one month prior to OV   prod green mucus daytime > night assoc with with purulent nasal discharge and RUL infiltrate on cxr.  Symptoms have persisted about the same since onset assoc with mild doe. Some better cough with otcs. rec augmentin 875 twice daily x 10 days,  bfast and supper and yogurt for lunch  Flonase has  no immediate benefit in terms of improving symptoms.  To help them reached the target tissue, the patient should use Afrin two puffs every 12 hours  GERD diet    02/09/2013 f/u ov/Joy Martin cc cough almost gone, no more green mucus, not a lot better with afrin though in terms of nasal symptoms rec gerd diet Sinus CT Pos > Joy Martin  03/23/2013 f/u ov/Joy Martin re chronic cough Chief Complaint  Patient presents with  . Follow-up    Breathing is unchanged. Reports SOB. Denies chest pain, chest tightness, coughing or wheezing.  only sob with exertion but really not limiting her.  rec Please schedule a follow up visit in 3 months but call sooner if needed with CXR  Ok to continue corgard for now but I would try one half twice   06/28/2013 f/u ov/Joy Martin re f/u cxr/pfts/ summary discussion Chief Complaint  Patient presents with  . Follow-up    CXR today--pt reports breathing is doing well denies any concerns at this time   no limiting sob, no need for saba   No obvious daytime variabilty or assoc cough or   cp or chest tightness, subjective wheeze overt   hb symptoms. No unusual exp hx or h/o childhood pna/ asthma or premature birth to her knowledge.   Sleeping ok without nocturnal  or early am exacerbation  of respiratory  c/o's or need for noct saba. Also denies any obvious fluctuation  of symptoms with weather or environmental changes or other aggravating or alleviating factors except as outlined above     Current Medications, Allergies, Past Medical History, Past Surgical History, Family History, and Social History were reviewed in [employer] record.  ROS  The following are not active complaints unless bolded sore throat, dysphagia, dental problems, itching, sneezing,  nasal congestion or excess/ purulent secretions, ear ache,   fever, chills, sweats, unintended wt loss, pleuritic or exertional cp, hemoptysis,  orthopnea pnd or leg swelling, presyncope, palpitations, heartburn, abdominal pain, anorexia, nausea, vomiting, diarrhea  or change in bowel or urinary habits, change in stools or urine, dysuria,hematuria,  rash, arthralgias, visual complaints, headache, numbness weakness or ataxia or problems with walking or coordination,  change in mood/affect or memory.                 Objective:   Physical Exam   02/09/2013   180  > 03/23/2013  181> 181 06/28/2013  Wt Readings from Last 3 Encounters:  01/26/13 179 lb (81.194 kg)  10/25/12 187 lb 6.4 oz (85.004 kg)  07/25/12 188 lb 12.8 oz (85.639 kg)     Nasal tone to voice but very amb wf nad  HEENT: nl dentition, and orophanx. Nl external ear canals without cough reflex, mod bilateral non-specific turbinate edema   NECK :  without JVD/Nodes/TM/ nl carotid upstrokes bilaterally   LUNGS: no acc muscle use, clear to A and P bilaterally without cough on insp or exp maneuvers   CV:  RRR  no s3 or murmur or increase in P2, no edema   ABD:  soft and nontender with nl excursion in the supine position. No bruits or organomegaly, bowel sounds nl  MS:  warm without deformities, calf tenderness, cyanosis or clubbing       01/09/13 CT Chest 1. The abnormality in the apex of the right upper lobe on the  recent chest radiograph appears to be part of a generalized process  in the lungs bilaterally, as  detailed above. 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 No definite  suspicious-appearing nodule or mass is noted at this time to  strongly suggest the presence of a neoplasm.  2. Moderate size hiatal hernia.    CXR  06/28/2013 :  Underlying emphysema. Areas of scarring bilaterally.  No frank edema or consolidation appreciated on this study. Hiatal  hernia present.         Assessment & Plan:

## 2013-07-01 NOTE — Assessment & Plan Note (Signed)
-   cxr 01/10/13 with RUL changes > CT 01/17/13 with generalized bilateral changes c/w ? 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

## 2013-07-01 NOTE — Assessment & Plan Note (Signed)
-   PFT's 03/23/2013 FEV1  1.51 (82%) ratio 69 no better p B2, DLCO 85%  Barely meets criteria for copd - if starts having difficult to control symptoms would immediately be concerned about adverse effects of fosfamax or corgard and consider trial off of these instead of escalating pulmonary meds

## 2013-07-01 NOTE — Assessment & Plan Note (Signed)
-   Sinus ct 02/14/2013 > Pansinusitis. Findings likely represent a combination of acute and chronic sinus disease >  Referred to ENT  Reviewed optimal dosing for nasal steroids  See instructions for specific recommendations which were reviewed directly with the patient who was given a copy with highlighter outlining the key components.

## 2013-07-22 ENCOUNTER — Other Ambulatory Visit: Payer: Self-pay | Admitting: Internal Medicine

## 2013-08-09 ENCOUNTER — Other Ambulatory Visit: Payer: Self-pay | Admitting: Internal Medicine

## 2013-08-09 NOTE — Telephone Encounter (Signed)
MD out of office. Pls advise on refill.../lmb 

## 2013-08-09 NOTE — Telephone Encounter (Signed)
Ok to refill, will need to be phoned in

## 2013-08-09 NOTE — Telephone Encounter (Signed)
Called [facility] spoke with Allison/pharmacist gave approval...lmb

## 2013-09-13 ENCOUNTER — Other Ambulatory Visit: Payer: Self-pay

## 2013-10-03 ENCOUNTER — Other Ambulatory Visit: Payer: Self-pay | Admitting: Internal Medicine

## 2013-10-03 NOTE — Telephone Encounter (Signed)
Phoned in to pharmacy. 

## 2013-10-03 NOTE — Telephone Encounter (Signed)
Ok to phone in fioricet 

## 2013-10-15 ENCOUNTER — Encounter: Payer: Self-pay | Admitting: Internal Medicine

## 2013-10-22 ENCOUNTER — Other Ambulatory Visit: Payer: Self-pay | Admitting: Internal Medicine

## 2013-10-22 NOTE — Telephone Encounter (Signed)
VAL pt  

## 2013-10-23 MED ORDER — ALENDRONATE SODIUM 70 MG PO TABS: ORAL_TABLET | ORAL | Status: DC

## 2013-10-23 NOTE — Telephone Encounter (Signed)
Rx refill sent to pharmacy. 

## 2013-10-23 NOTE — Addendum Note (Signed)
Addended by: Jimmye Norman on: 10/23/2013 11:22 AM   Modules accepted: Orders

## 2013-11-06 ENCOUNTER — Ambulatory Visit (INDEPENDENT_AMBULATORY_CARE_PROVIDER_SITE_OTHER): Payer: Medicare Other | Admitting: Internal Medicine

## 2013-11-06 ENCOUNTER — Encounter: Payer: Self-pay | Admitting: Internal Medicine

## 2013-11-06 ENCOUNTER — Other Ambulatory Visit (INDEPENDENT_AMBULATORY_CARE_PROVIDER_SITE_OTHER): Payer: 59

## 2013-11-06 VITALS — BP 140/88 | HR 59 | Temp 97.7°F | Wt 179.8 lb

## 2013-11-06 DIAGNOSIS — Z0000 Encounter for general adult medical examination without abnormal findings: Secondary | ICD-10-CM

## 2013-11-06 DIAGNOSIS — Z23 Encounter for immunization: Secondary | ICD-10-CM

## 2013-11-06 DIAGNOSIS — I10 Essential (primary) hypertension: Secondary | ICD-10-CM

## 2013-11-06 DIAGNOSIS — E669 Obesity, unspecified: Secondary | ICD-10-CM

## 2013-11-06 DIAGNOSIS — M899 Disorder of bone, unspecified: Secondary | ICD-10-CM

## 2013-11-06 LAB — HEPATIC FUNCTION PANEL
ALT: 24 U/L (ref 0–35)
AST: 28 U/L (ref 0–37)
Albumin: 3.6 g/dL (ref 3.5–5.2)
Bilirubin, Direct: 0 mg/dL (ref 0.0–0.3)
Total Bilirubin: 0.9 mg/dL (ref 0.3–1.2)

## 2013-11-06 LAB — BASIC METABOLIC PANEL
Calcium: 10.1 mg/dL (ref 8.4–10.5)
Chloride: 103 mEq/L (ref 96–112)
Potassium: 4 mEq/L (ref 3.5–5.1)

## 2013-11-06 LAB — CBC WITH DIFFERENTIAL/PLATELET
Basophils Absolute: 0 10*3/uL (ref 0.0–0.1)
Eosinophils Absolute: 0.3 10*3/uL (ref 0.0–0.7)
Hemoglobin: 11.7 g/dL — ABNORMAL LOW (ref 12.0–15.0)
Lymphocytes Relative: 22.1 % (ref 12.0–46.0)
MCHC: 33 g/dL (ref 30.0–36.0)
Neutrophils Relative %: 65.3 % (ref 43.0–77.0)
Platelets: 298 10*3/uL (ref 150.0–400.0)
RDW: 14.1 % (ref 11.5–14.6)
WBC: 5.2 10*3/uL (ref 4.5–10.5)

## 2013-11-06 LAB — TSH: TSH: 2.06 u[IU]/mL (ref 0.35–5.50)

## 2013-11-06 NOTE — Assessment & Plan Note (Signed)
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

## 2013-11-06 NOTE — Assessment & Plan Note (Signed)
Wt Readings from Last 3 Encounters:  11/06/13 179 lb 12.8 oz (81.557 kg)  06/28/13 180 lb 12.8 oz (82.01 kg)  05/02/13 182 lb 12.8 oz (82.918 kg)   Weight trend reviewed Working with [employer] since 03/2012 The patient is asked to make an attempt to improve diet and exercise patterns to aid in medical management of this problem.

## 2013-11-06 NOTE — Assessment & Plan Note (Signed)
On calcium and fosamax added summer 2013 encouraged weight bearing exercise  DEXAs reviewed

## 2013-11-06 NOTE — Patient Instructions (Addendum)
It was good to see you today.  We have reviewed your prior records including labs and tests today  Prevnar pneumonia vaccine administered today - Health Maintenance reviewed - all recommended immunizations and age-appropriate screenings are up-to-date.  Test(s) ordered today. Your results will be released to MyChart (or called to you) after review, usually within 72hours after test completion. If any changes need to be made, you will be notified at that same time.  Medications reviewed and updated, no changes recommended at this time.  Continue working with [employer] for weight reduction as discussed  Please schedule followup in 6 months, call sooner if problems.  Health Maintenance, Female A healthy lifestyle and preventative care can promote health and wellness.  Maintain regular health, dental, and eye exams.  Eat a healthy diet. Foods like vegetables, fruits, whole grains, low-fat dairy products, and lean protein foods contain the nutrients you need without too many calories. Decrease your intake of foods high in solid fats, added sugars, and salt. Get information about a proper diet from your caregiver, if necessary.  Regular physical exercise is one of the most important things you can do for your health. Most adults should get at least 150 minutes of moderate-intensity exercise (any activity that increases your heart rate and causes you to sweat) each week. In addition, most adults need muscle-strengthening exercises on 2 or more days a week.   Maintain a healthy weight. The body mass index (BMI) is a screening tool to identify possible weight problems. It provides an estimate of body fat based on height and weight. Your caregiver can help determine your BMI, and can help you achieve or maintain a healthy weight. For adults 20 years and older:  A BMI below 18.5 is considered underweight.  A BMI of 18.5 to 24.9 is normal.  A BMI of 25 to 29.9 is considered overweight.  A  BMI of 30 and above is considered obese.  Maintain normal blood lipids and cholesterol by exercising and minimizing your intake of saturated fat. Eat a balanced diet with plenty of fruits and vegetables. Blood tests for lipids and cholesterol should begin at age 29 and be repeated every 5 years. If your lipid or cholesterol levels are high, you are over 50, or you are a high risk for heart disease, you may need your cholesterol levels checked more frequently.Ongoing high lipid and cholesterol levels should be treated with medicines if diet and exercise are not effective.  If you smoke, find out from your caregiver how to quit. If you do not use tobacco, do not start.  Lung cancer screening is recommended for adults aged 42 80 years who are at high risk for developing lung cancer because of a history of smoking. Yearly low-dose computed tomography (CT) is recommended for people who have at least a 30-pack-year history of smoking and are a current smoker or have quit within the past 15 years. A pack year of smoking is smoking an average of 1 pack of cigarettes a day for 1 year (for example: 1 pack a day for 30 years or 2 packs a day for 15 years). Yearly screening should continue until the smoker has stopped smoking for at least 15 years. Yearly screening should also be stopped for people who develop a health problem that would prevent them from having lung cancer treatment.  If you are pregnant, do not drink alcohol. If you are breastfeeding, be very cautious about drinking alcohol. If you are not pregnant and choose  to drink alcohol, do not exceed 1 drink per day. One drink is considered to be 12 ounces (355 mL) of beer, 5 ounces (148 mL) of wine, or 1.5 ounces (44 mL) of liquor.  Avoid use of street drugs. Do not share needles with anyone. Ask for help if you need support or instructions about stopping the use of drugs.  High blood pressure causes heart disease and increases the risk of stroke. Blood  pressure should be checked at least every 1 to 2 years. Ongoing high blood pressure should be treated with medicines, if weight loss and exercise are not effective.  If you are 15 to 74 years old, ask your caregiver if you should take aspirin to prevent strokes.  Diabetes screening involves taking a blood sample to check your fasting blood sugar level. This should be done once every 3 years, after age 76, if you are within normal weight and without risk factors for diabetes. Testing should be considered at a younger age or be carried out more frequently if you are overweight and have at least 1 risk factor for diabetes.  Breast cancer screening is essential preventative care for women. You should practice "breast self-awareness." This means understanding the normal appearance and feel of your breasts and may include breast self-examination. Any changes detected, no matter how small, should be reported to a caregiver. Women in their 9s and 30s should have a clinical breast exam (CBE) by a caregiver as part of a regular health exam every 1 to 3 years. After age 94, women should have a CBE every year. Starting at age 29, women should consider having a mammogram (breast X-ray) every year. Women who have a family history of breast cancer should talk to their caregiver about genetic screening. Women at a high risk of breast cancer should talk to their caregiver about having an MRI and a mammogram every year.  Breast cancer gene (BRCA)-related cancer risk assessment is recommended for women who have family members with BRCA-related cancers. BRCA-related cancers include breast, ovarian, tubal, and peritoneal cancers. Having family members with these cancers may be associated with an increased risk for harmful changes (mutations) in the breast cancer genes BRCA1 and BRCA2. Results of the assessment will determine the need for genetic counseling and BRCA1 and BRCA2 testing.  The Pap test is a screening test for  cervical cancer. Women should have a Pap test starting at age 40. Between ages 39 and 26, Pap tests should be repeated every 2 years. Beginning at age 94, you should have a Pap test every 3 years as long as the past 3 Pap tests have been normal. If you had a hysterectomy for a problem that was not cancer or a condition that could lead to cancer, then you no longer need Pap tests. If you are between ages 15 and 83, and you have had normal Pap tests going back 10 years, you no longer need Pap tests. If you have had past treatment for cervical cancer or a condition that could lead to cancer, you need Pap tests and screening for cancer for at least 20 years after your treatment. If Pap tests have been discontinued, risk factors (such as a new sexual partner) need to be reassessed to determine if screening should be resumed. Some women have medical problems that increase the chance of getting cervical cancer. In these cases, your caregiver may recommend more frequent screening and Pap tests.  The human papillomavirus (HPV) test is an additional test that  may be used for cervical cancer screening. The HPV test looks for the virus that can cause the cell changes on the cervix. The cells collected during the Pap test can be tested for HPV. The HPV test could be used to screen women aged 17 years and older, and should be used in women of any age who have unclear Pap test results. After the age of 97, women should have HPV testing at the same frequency as a Pap test.  Colorectal cancer can be detected and often prevented. Most routine colorectal cancer screening begins at the age of 22 and continues through age 66. However, your caregiver may recommend screening at an earlier age if you have risk factors for colon cancer. On a yearly basis, your caregiver may provide home test kits to check for hidden blood in the stool. Use of a small camera at the end of a tube, to directly examine the colon (sigmoidoscopy or  colonoscopy), can detect the earliest forms of colorectal cancer. Talk to your caregiver about this at age 37, when routine screening begins. Direct examination of the colon should be repeated every 5 to 10 years through age 68, unless early forms of pre-cancerous polyps or small growths are found.  Hepatitis C blood testing is recommended for all people born from [birth year] through 1967 and any individual with known risks for hepatitis C.  Practice safe sex. Use condoms and avoid high-risk sexual practices to reduce the spread of sexually transmitted infections (STIs). Sexually active women aged 22 and younger should be checked for Chlamydia, which is a common sexually transmitted infection. Older women with new or multiple partners should also be tested for Chlamydia. Testing for other STIs is recommended if you are sexually active and at increased risk.  Osteoporosis is a disease in which the bones lose minerals and strength with aging. This can result in serious bone fractures. The risk of osteoporosis can be identified using a bone density scan. Women ages 71 and over and women at risk for fractures or osteoporosis should discuss screening with their caregivers. Ask your caregiver whether you should be taking a calcium supplement or vitamin D to reduce the rate of osteoporosis.  Menopause can be associated with physical symptoms and risks. Hormone replacement therapy is available to decrease symptoms and risks. You should talk to your caregiver about whether hormone replacement therapy is right for you.  Use sunscreen. Apply sunscreen liberally and repeatedly throughout the day. You should seek shade when your shadow is shorter than you. Protect yourself by wearing long sleeves, pants, a wide-brimmed hat, and sunglasses year round, whenever you are outdoors.  Notify your caregiver of new moles or changes in moles, especially if there is a change in shape or color. Also notify your caregiver if a mole is  larger than the size of a pencil eraser.  Stay current with your immunizations. Document Released: 05/10/2011 Document Revised: 02/19/2013 Document Reviewed: 05/10/2011 [facility] Patient Information 2014 [place], [place].

## 2013-11-06 NOTE — Addendum Note (Signed)
Addended by: Deatra James on: 11/06/2013 11:25 AM   Modules accepted: Orders

## 2013-11-06 NOTE — Progress Notes (Signed)
Subjective:    Patient ID: Joy Martin, female    DOB: 1941/07/02, 72 y.o.   MRN: [id number]  HPI  Here for medicare wellness  Diet: heart healthy or DM if diabetic Physical activity: sedentary Depression/mood screen: negative Hearing: intact to whispered voice Visual acuity: grossly normal, performs annual eye exam  ADLs: capable Fall risk: none Home safety: good Cognitive evaluation: intact to orientation, naming, recall and repetition EOL planning: adv directives, full code/ I agree  I have personally reviewed and have noted 1. The patient's medical and social history 2. Their use of alcohol, tobacco or illicit drugs 3. Their current medications and supplements 4. The patient's functional ability including ADL's, fall risks, home safety risks and hearing or visual impairment. 5. Diet and physical activities 6. Evidence for depression or mood disorders  Also reviewed chronic medical issues today and interval medical events. Also several medical questions:   Past Medical History  Diagnosis Date  . ALLERGIC RHINITIS, CHRONIC   . ANEMIA   . Hypertension   . EXOGENOUS OBESITY   . GERD   . HYPERLIPIDEMIA   . OSTEOPENIA   . Episodic tension type headache   . Migraines 07/10/2012    "over; last one was @ age 34"  . Arthritis     "knees mainly; thumbs"  . COPD (chronic obstructive pulmonary disease) dx 2013    GOLD 1, follows with pulm for same   Family History  Problem Relation Age of Onset  . Prostate cancer Father   . Cancer Father     prostate  . Alcohol abuse Other   . Heart disease Other   . Hypertension Other   . Diabetes Other   . Pancreatic cancer Other   . Cancer Mother     waldenstruns microgobular anemia   History  Substance Use Topics  . Smoking status: Former Smoker -- 0.75 packs/day for 20 years    Types: Cigarettes    Quit date: 07/25/1982  . Smokeless tobacco: Never Used     Comment: 07/10/2012 "stopped smoking cigarettes 1980's"  .  Alcohol Use: Yes     Comment: 07/10/2012 "glass of wine 1-2X/yr"    Review of Systems  Constitutional: Negative for fatigue and unexpected weight change.  Respiratory: Negative for cough, shortness of breath and wheezing.   Cardiovascular: Negative for chest pain, palpitations and leg swelling.  Gastrointestinal: Negative for nausea, abdominal pain and diarrhea.  Neurological: Negative for dizziness, weakness, light-headedness and headaches.  Psychiatric/Behavioral: Negative for dysphoric mood. The patient is not nervous/anxious.   All other systems reviewed and are negative.       Objective:   Physical Exam BP 140/88  Pulse 59  Temp(Src) 97.7 F (36.5 C) (Oral)  Wt 179 lb 12.8 oz (81.557 kg)  SpO2 97% Wt Readings from Last 3 Encounters:  11/06/13 179 lb 12.8 oz (81.557 kg)  06/28/13 180 lb 12.8 oz (82.01 kg)  05/02/13 182 lb 12.8 oz (82.918 kg)   Constitutional: She is overweight, but appears well-developed and well-nourished. No distress.  Neck: Thick, Normal range of motion. Neck supple. No JVD present. No thyromegaly present.  Cardiovascular: Normal rate, regular rhythm and normal heart sounds.  No murmur heard. No BLE edema. Pulmonary/Chest: Effort normal and breath sounds normal. No respiratory distress. She has no wheezes.  Skin: Skin is warm and dry. No rash noted. No erythema.  Psychiatric: normal mood and affect. behavior is normal. Judgment and thought content normal.    Lab Results  Component  Value Date   WBC 13.0* 07/10/2012   HGB 11.9* 07/10/2012   HCT 35.4* 07/10/2012   PLT 240 07/10/2012   GLUCOSE 99 05/16/2013   CHOL 170 05/16/2013   TRIG 107.0 05/16/2013   HDL 67.10 05/16/2013   LDLDIRECT 143.2 12/19/2007   LDLCALC 82 05/16/2013   ALT 15 07/10/2012   AST 21 07/10/2012   NA 142 05/16/2013   K 5.1 05/16/2013   CL 103 05/16/2013   CREATININE 1.2 05/16/2013   BUN 26* 05/16/2013   CO2 36* 05/16/2013   TSH 1.55 05/03/2012   HGBA1C 6.5 10/25/2012       Assessment & Plan:    AWV/CPX/v70.0 - Today patient counseled on age appropriate routine health concerns for screening and prevention, each reviewed and up to date or declined. Immunizations reviewed and up to date or declined. Labs ordered and reviewed. Risk factors for depression reviewed and negative. Hearing function and visual acuity are intact. ADLs screened and addressed as needed. Functional ability and level of safety reviewed and appropriate. Education, counseling and referrals performed based on assessed risks today. Patient provided with a copy of personalized plan for preventive services.  Also see problem list. Medications and labs reviewed today.

## 2013-11-06 NOTE — Progress Notes (Signed)
Pre-visit discussion using our clinic review tool. No additional management support is needed unless otherwise documented below in the visit note.  

## 2013-11-23 LAB — HM MAMMOGRAPHY: HM MAMMO: NEGATIVE

## 2013-11-26 ENCOUNTER — Encounter: Payer: Self-pay | Admitting: Internal Medicine

## 2013-11-29 ENCOUNTER — Other Ambulatory Visit: Payer: Self-pay | Admitting: Internal Medicine

## 2013-11-29 NOTE — Telephone Encounter (Signed)
This was sent to our office, but I do not show that he has ever been seen at this location

## 2013-12-13 ENCOUNTER — Encounter: Payer: Self-pay | Admitting: Internal Medicine

## 2013-12-20 ENCOUNTER — Telehealth: Payer: Self-pay | Admitting: *Deleted

## 2013-12-20 NOTE — Telephone Encounter (Signed)
MD wanted me to call pt regarding the form she mail in for her Butabital medication. Insurance will only cover a one time prescription. Called pt no answer LMOM RTC...Joy Martin

## 2013-12-20 NOTE — Telephone Encounter (Signed)
Pt return call back gave md response.../lmb 

## 2014-01-26 ENCOUNTER — Encounter: Payer: Self-pay | Admitting: Internal Medicine

## 2014-01-26 ENCOUNTER — Telehealth: Payer: Self-pay | Admitting: Internal Medicine

## 2014-01-29 MED ORDER — BUTALBITAL-APAP-CAFF-COD 50-325-40-30 MG PO CAPS: ORAL_CAPSULE | ORAL | Status: DC

## 2014-01-29 NOTE — Addendum Note (Signed)
Addended by: Gwendolyn Grant A on: 01/29/2014 06:08 PM   Modules accepted: Orders

## 2014-01-30 NOTE — Telephone Encounter (Signed)
[facility].../lmb 

## 2014-02-13 ENCOUNTER — Other Ambulatory Visit: Payer: Self-pay | Admitting: Internal Medicine

## 2014-03-24 ENCOUNTER — Other Ambulatory Visit: Payer: Self-pay | Admitting: Internal Medicine

## 2014-03-25 MED ORDER — BUTALBITAL-APAP-CAFF-COD 50-325-40-30 MG PO CAPS: ORAL_CAPSULE | ORAL | Status: DC

## 2014-03-25 NOTE — Telephone Encounter (Signed)
Pt had a CPE with you in 12/14--please advise if okay to refill

## 2014-03-25 NOTE — Telephone Encounter (Signed)
30

## 2014-03-25 NOTE — Telephone Encounter (Signed)
[facility].../lmb 

## 2014-03-28 ENCOUNTER — Other Ambulatory Visit: Payer: Self-pay | Admitting: *Deleted

## 2014-03-28 NOTE — Telephone Encounter (Signed)
Received fax pt needing PA on her generic firocet. Completed on cover my meds. Received confirmation fax med has been approved. Notified pharmacy spoke with Ebony Hail gave approval status...Joy Martin

## 2014-04-24 ENCOUNTER — Other Ambulatory Visit: Payer: Self-pay | Admitting: Internal Medicine

## 2014-04-25 NOTE — Telephone Encounter (Signed)
[facility]ed [facility].../lmb 

## 2014-05-07 ENCOUNTER — Encounter: Payer: Self-pay | Admitting: Internal Medicine

## 2014-05-07 ENCOUNTER — Ambulatory Visit (INDEPENDENT_AMBULATORY_CARE_PROVIDER_SITE_OTHER): Payer: 59 | Admitting: Internal Medicine

## 2014-05-07 ENCOUNTER — Other Ambulatory Visit (INDEPENDENT_AMBULATORY_CARE_PROVIDER_SITE_OTHER): Payer: 59

## 2014-05-07 VITALS — BP 126/78 | HR 58 | Temp 98.1°F | Wt 181.8 lb

## 2014-05-07 DIAGNOSIS — I10 Essential (primary) hypertension: Secondary | ICD-10-CM

## 2014-05-07 DIAGNOSIS — M949 Disorder of cartilage, unspecified: Principal | ICD-10-CM

## 2014-05-07 DIAGNOSIS — K219 Gastro-esophageal reflux disease without esophagitis: Secondary | ICD-10-CM

## 2014-05-07 DIAGNOSIS — M899 Disorder of bone, unspecified: Secondary | ICD-10-CM

## 2014-05-07 DIAGNOSIS — E785 Hyperlipidemia, unspecified: Secondary | ICD-10-CM

## 2014-05-07 DIAGNOSIS — N959 Unspecified menopausal and perimenopausal disorder: Secondary | ICD-10-CM

## 2014-05-07 LAB — VITAMIN D 25 HYDROXY (VIT D DEFICIENCY, FRACTURES): VITD: 59.53 ng/mL

## 2014-05-07 LAB — BASIC METABOLIC PANEL
BUN: 20 mg/dL (ref 6–23)
CALCIUM: 10.4 mg/dL (ref 8.4–10.5)
CHLORIDE: 103 meq/L (ref 96–112)
CO2: 30 mEq/L (ref 19–32)
Creatinine, Ser: 1.1 mg/dL (ref 0.4–1.2)
GFR: 50.38 mL/min — ABNORMAL LOW (ref >60.00)
GLUCOSE: 110 mg/dL — AB (ref 70–99)
POTASSIUM: 4.2 meq/L (ref 3.5–5.1)
SODIUM: 140 meq/L (ref 135–145)

## 2014-05-07 LAB — LIPID PANEL
CHOLESTEROL: 172 mg/dL (ref 0–200)
HDL: 78.8 mg/dL (ref >39.00)
LDL Cholesterol: 74 mg/dL (ref 0–99)
NONHDL: 93.2
Total CHOL/HDL Ratio: 2
Triglycerides: 97 mg/dL (ref 0.0–149.0)
VLDL: 19.4 mg/dL (ref 0.0–40.0)

## 2014-05-07 MED ORDER — PAROXETINE HCL 10 MG PO TABS: 10.0000 mg | ORAL_TABLET | Freq: Every day | ORAL | Status: DC

## 2014-05-07 NOTE — Progress Notes (Signed)
Subjective:    Patient ID: Joy Martin, female    DOB: 01-19-1941, 73 y.o.   MRN: [id number]  HPI  Patient is here for follow up  Reviewed chronic medical issues and interval medical events  Past Medical History  Diagnosis Date  . ALLERGIC RHINITIS, CHRONIC   . ANEMIA   . Hypertension   . EXOGENOUS OBESITY   . GERD   . HYPERLIPIDEMIA   . OSTEOPENIA   . Episodic tension type headache   . Migraines 07/10/2012    "over; last one was @ age 72"  . Arthritis     "knees mainly; thumbs"  . COPD (chronic obstructive pulmonary disease) dx 2013    GOLD 1, follows with pulm for same  . Chronic sinusitis     follows with ENT for same    Review of Systems  Constitutional: Negative for fever, fatigue and unexpected weight change.  Respiratory: Negative for cough and shortness of breath.   Cardiovascular: Negative for chest pain and leg swelling.  Endocrine: Positive for heat intolerance (chronic).  Neurological: Negative for light-headedness and headaches.       Objective:   Physical Exam  BP 126/78  Pulse 58  Temp(Src) 98.1 F (36.7 C) (Oral)  Wt 181 lb 12.8 oz (82.464 kg)  SpO2 96% Wt Readings from Last 3 Encounters:  05/07/14 181 lb 12.8 oz (82.464 kg)  11/06/13 179 lb 12.8 oz (81.557 kg)  06/28/13 180 lb 12.8 oz (82.01 kg)   Constitutional: She is obese, but appears well-developed and well-nourished. No distress.  Neck: Normal range of motion. Neck supple. No JVD present. No thyromegaly present.  Cardiovascular: Normal rate, regular rhythm and normal heart sounds.  No murmur heard. No BLE edema. Pulmonary/Chest: Effort normal and breath sounds normal. No respiratory distress. She has no wheezes.  Psychiatric: She has a normal mood and affect. Her behavior is normal. Judgment and thought content normal.   Lab Results  Component Value Date   WBC 5.2 11/06/2013   HGB 11.7* 11/06/2013   HCT 35.5* 11/06/2013   PLT 298.0 11/06/2013   GLUCOSE 92 11/06/2013   CHOL  170 05/16/2013   TRIG 107.0 05/16/2013   HDL 67.10 05/16/2013   LDLDIRECT 143.2 12/19/2007   LDLCALC 82 05/16/2013   ALT 24 11/06/2013   AST 28 11/06/2013   NA 140 11/06/2013   K 4.0 11/06/2013   CL 103 11/06/2013   CREATININE 1.2 11/06/2013   BUN 25* 11/06/2013   CO2 31 11/06/2013   TSH 2.06 11/06/2013   HGBA1C 6.5 10/25/2012    Dg Chest 2 View  06/28/2013   *RADIOLOGY REPORT*  Clinical Data: Chest congestion and COPD  CHEST - 2 VIEW  Comparison: February 09, 2013  Findings: There is underlying emphysema.  There is mild scarring in the bases.  There is no edema or consolidation. The previous opacity in the right upper lobe is not well seen at this time. There is some slight scarring in this area currently.  The heart size is within normal limits.  Pulmonary vascularity reflects underlying emphysema.  There is a hiatal hernia. No adenopathy.  IMPRESSION: Underlying emphysema.  Areas of scarring bilaterally. No frank edema or consolidation appreciated on this study.  Hiatal hernia present.   Original Report Authenticated By: Lowella Grip, M.D.       Assessment & Plan:   Problem List Items Addressed This Visit   ESSENTIAL HYPERTENSION, BENIGN      BP Readings from Last 3  Encounters:  05/07/14 126/78  11/06/13 140/88  06/28/13 120/72   Beta blocker dose reduction spring 2014 by pulmonary in setting of new mild COPD diagnosis reviewed hx renal insuff summer 2012 related to diuretic/ARB use - increased ARB and diuretic dose 04/2013 - Cr stable but monitor    Relevant Orders      Lipid panel      Basic metabolic panel   GERD     Takes Nexium BID Given bone loss, pt will ing to try to wean down from same Change PPI to qd and use H2B OTC prn refractory symptoms     HYPERLIPIDEMIA     On statin - check annually Last lipids reviewed    Relevant Orders      Lipid panel   OSTEOPENIA - Primary     On calcium and fosamax added summer 2013 encouraged weight bearing exercise  DEXAs  reviewed - due for follow up now @ [facility] - pt will arrange same    Relevant Orders      Vit D  25 hydroxy (rtn osteoporosis monitoring)   Postmenopausal symptoms     Exacerbated by stopping Estrace 2011 Black cohash ineffective as was OTC Estrovera Declined to start SNRI Effexor 10/2012 due to "withdraw" symptoms potential when topped Requests Brisdelle - will use 10mg  generic paroxetine instead for cost - erx don

## 2014-05-07 NOTE — Assessment & Plan Note (Signed)
On statin - check annually Last lipids reviewed

## 2014-05-07 NOTE — Assessment & Plan Note (Signed)
On calcium and fosamax added summer 2013 encouraged weight bearing exercise  DEXAs reviewed - due for follow up now @ [facility] - pt will arrange same

## 2014-05-07 NOTE — Assessment & Plan Note (Signed)
Takes Nexium BID Given bone loss, pt will ing to try to wean down from same Change PPI to qd and use H2B OTC prn refractory symptoms

## 2014-05-07 NOTE — Assessment & Plan Note (Signed)
Exacerbated by stopping Estrace 2011 Black cohash ineffective as was OTC Estrovera Declined to start SNRI Effexor 10/2012 due to "withdraw" symptoms potential when topped Requests Brisdelle - will use 10mg  generic paroxetine instead for cost - erx don

## 2014-05-07 NOTE — Patient Instructions (Signed)
It was good to see you today.  We have reviewed your prior records including labs and tests today  Test(s) ordered today. Your results will be released to [place] (or called to you) after review, usually within 72hours after test completion. If any changes need to be made, you will be notified at that same time.  Medications reviewed and updated Start low dose generic paroxetine as discussed Ok to decrease nexium to once daily as toelrated  no other changes recommended at this time. Refill on medication(s) as discussed today.  Please schedule followup in 12 months for annual exam, call sooner if problems.

## 2014-05-07 NOTE — Assessment & Plan Note (Signed)
BP Readings from Last 3 Encounters:  05/07/14 126/78  11/06/13 140/88  06/28/13 120/72   Beta blocker dose reduction spring 2014 by pulmonary in setting of new mild COPD diagnosis reviewed hx renal insuff summer 2012 related to diuretic/ARB use - increased ARB and diuretic dose 04/2013 - Cr stable but monitor

## 2014-05-07 NOTE — Progress Notes (Signed)
Pre visit review using our clinic review tool, if applicable. No additional management support is needed unless otherwise documented below in the visit note. 

## 2014-05-08 ENCOUNTER — Telehealth: Payer: Self-pay | Admitting: Internal Medicine

## 2014-05-08 NOTE — Telephone Encounter (Signed)
Relevant patient education assigned to patient using Emmi. ° °

## 2014-05-13 ENCOUNTER — Encounter: Payer: Self-pay | Admitting: Internal Medicine

## 2014-05-15 LAB — HM DEXA SCAN

## 2014-06-15 ENCOUNTER — Other Ambulatory Visit: Payer: Self-pay | Admitting: Internal Medicine

## 2014-06-23 ENCOUNTER — Other Ambulatory Visit: Payer: Self-pay | Admitting: Internal Medicine

## 2014-06-24 MED ORDER — BUTALBITAL-APAP-CAFF-COD 50-325-40-30 MG PO CAPS: ORAL_CAPSULE | ORAL | Status: DC

## 2014-06-24 NOTE — Telephone Encounter (Signed)
[facility].../lmb 

## 2014-08-22 ENCOUNTER — Other Ambulatory Visit: Payer: Self-pay | Admitting: *Deleted

## 2014-08-23 ENCOUNTER — Other Ambulatory Visit: Payer: Self-pay | Admitting: *Deleted

## 2014-08-23 ENCOUNTER — Other Ambulatory Visit: Payer: Self-pay | Admitting: Internal Medicine

## 2014-08-23 MED ORDER — FLUTICASONE PROPIONATE 50 MCG/ACT NA SUSP: NASAL | Status: DC

## 2014-08-23 NOTE — Telephone Encounter (Signed)
MD out of office. Is this ok to refill.../lmb 

## 2014-08-23 NOTE — Telephone Encounter (Signed)
Been getting refills from Dr. [name] will hold until she return for refill...Joy Martin

## 2014-08-23 NOTE — Telephone Encounter (Signed)
Please see EMR ; this was ordered by Marlis Edelson ???

## 2014-08-24 ENCOUNTER — Other Ambulatory Visit: Payer: Self-pay

## 2014-08-24 ENCOUNTER — Other Ambulatory Visit: Payer: Self-pay | Admitting: Internal Medicine

## 2014-08-26 ENCOUNTER — Other Ambulatory Visit: Payer: Self-pay

## 2014-08-26 MED ORDER — BUTALBITAL-APAP-CAFF-COD 50-325-40-30 MG PO CAPS: ORAL_CAPSULE | ORAL | Status: DC

## 2014-08-26 NOTE — Telephone Encounter (Signed)
Refill done.  

## 2014-08-26 NOTE — Telephone Encounter (Signed)
Faxed script back to [facility].../lmb 

## 2014-08-27 ENCOUNTER — Other Ambulatory Visit: Payer: Self-pay

## 2014-08-27 ENCOUNTER — Telehealth: Payer: Self-pay

## 2014-08-27 MED ORDER — BUTALBITAL-APAP-CAFF-COD 50-325-40-30 MG PO CAPS: ORAL_CAPSULE | ORAL | Status: DC

## 2014-08-27 MED ORDER — FLUTICASONE PROPIONATE 50 MCG/ACT NA SUSP: NASAL | Status: DC

## 2014-08-27 NOTE — Telephone Encounter (Signed)
Rx request refill for Butalb-Caff Acetaminophen with codeine.   MD printed it but I do not have the hard script to send to pharm.

## 2014-09-04 ENCOUNTER — Other Ambulatory Visit: Payer: Self-pay | Admitting: Internal Medicine

## 2014-10-21 ENCOUNTER — Other Ambulatory Visit: Payer: Self-pay

## 2014-10-21 ENCOUNTER — Encounter: Payer: Self-pay | Admitting: Internal Medicine

## 2014-10-21 ENCOUNTER — Telehealth: Payer: Self-pay | Admitting: Internal Medicine

## 2014-10-21 MED ORDER — ALENDRONATE SODIUM 70 MG PO TABS: 70.0000 mg | ORAL_TABLET | ORAL | Status: DC

## 2014-10-21 MED ORDER — NABUMETONE 750 MG PO TABS: 750.0000 mg | ORAL_TABLET | Freq: Two times a day (BID) | ORAL | Status: DC | PRN

## 2014-10-22 ENCOUNTER — Other Ambulatory Visit: Payer: Self-pay

## 2014-10-22 ENCOUNTER — Other Ambulatory Visit: Payer: Self-pay | Admitting: Geriatric Medicine

## 2014-10-22 ENCOUNTER — Other Ambulatory Visit: Payer: Self-pay | Admitting: Internal Medicine

## 2014-10-22 MED ORDER — PAROXETINE HCL 10 MG PO TABS: 10.0000 mg | ORAL_TABLET | Freq: Every day | ORAL | Status: DC

## 2014-10-22 MED ORDER — BUTALBITAL-APAP-CAFF-COD 50-325-40-30 MG PO CAPS: 1.0000 | ORAL_CAPSULE | ORAL | Status: DC | PRN

## 2014-10-24 MED ORDER — BUTALBITAL-APAP-CAFF-COD 50-325-40-30 MG PO CAPS: ORAL_CAPSULE | ORAL | Status: DC

## 2014-10-24 NOTE — Telephone Encounter (Signed)
Done and signed 

## 2014-10-28 ENCOUNTER — Other Ambulatory Visit: Payer: Self-pay | Admitting: Geriatric Medicine

## 2014-10-28 MED ORDER — BUTALBITAL-APAP-CAFF-COD 50-325-40-30 MG PO CAPS: 1.0000 | ORAL_CAPSULE | ORAL | Status: DC | PRN

## 2014-10-28 MED ORDER — PAROXETINE HCL 10 MG PO TABS: 10.0000 mg | ORAL_TABLET | Freq: Every day | ORAL | Status: DC

## 2014-10-28 NOTE — Telephone Encounter (Signed)
Sent to pharmacy 

## 2014-10-29 ENCOUNTER — Other Ambulatory Visit: Payer: Self-pay | Admitting: Geriatric Medicine

## 2014-10-29 MED ORDER — BUTALBITAL-APAP-CAFF-COD 50-325-40-30 MG PO CAPS: ORAL_CAPSULE | ORAL | Status: DC

## 2014-11-28 LAB — HM MAMMOGRAPHY

## 2014-12-16 ENCOUNTER — Encounter: Payer: Self-pay | Admitting: Internal Medicine

## 2014-12-20 ENCOUNTER — Other Ambulatory Visit: Payer: Self-pay | Admitting: Family

## 2014-12-23 ENCOUNTER — Other Ambulatory Visit: Payer: Self-pay

## 2014-12-23 ENCOUNTER — Other Ambulatory Visit: Payer: Self-pay | Admitting: Internal Medicine

## 2014-12-23 MED ORDER — NADOLOL 40 MG PO TABS: 40.0000 mg | ORAL_TABLET | Freq: Every day | ORAL | Status: DC

## 2014-12-23 MED ORDER — VALSARTAN-HYDROCHLOROTHIAZIDE 320-12.5 MG PO TABS: 1.0000 | ORAL_TABLET | Freq: Every day | ORAL | Status: DC

## 2014-12-24 MED ORDER — BUTALBITAL-APAP-CAFF-COD 50-325-40-30 MG PO CAPS
1.0000 | ORAL_CAPSULE | ORAL | Status: DC | PRN
Start: 2014-12-24 — End: 2015-05-13

## 2015-03-21 ENCOUNTER — Encounter: Payer: Self-pay | Admitting: Internal Medicine

## 2015-04-09 DIAGNOSIS — C4401 Basal cell carcinoma of skin of lip: Secondary | ICD-10-CM

## 2015-04-09 HISTORY — DX: Basal cell carcinoma of skin of lip: C44.01

## 2015-04-18 ENCOUNTER — Other Ambulatory Visit: Payer: Self-pay | Admitting: Internal Medicine

## 2015-04-29 ENCOUNTER — Telehealth: Payer: Self-pay | Admitting: Emergency Medicine

## 2015-04-29 ENCOUNTER — Other Ambulatory Visit: Payer: Self-pay | Admitting: Internal Medicine

## 2015-05-13 ENCOUNTER — Ambulatory Visit (INDEPENDENT_AMBULATORY_CARE_PROVIDER_SITE_OTHER): Payer: Self-pay | Admitting: Internal Medicine

## 2015-05-13 ENCOUNTER — Other Ambulatory Visit (INDEPENDENT_AMBULATORY_CARE_PROVIDER_SITE_OTHER): Payer: Medicare Other

## 2015-05-13 ENCOUNTER — Encounter: Payer: Self-pay | Admitting: Internal Medicine

## 2015-05-13 VITALS — BP 138/84 | HR 57 | Temp 97.9°F | Ht 62.0 in | Wt 178.5 lb

## 2015-05-13 DIAGNOSIS — M8580 Other specified disorders of bone density and structure, unspecified site: Secondary | ICD-10-CM | POA: Diagnosis not present

## 2015-05-13 DIAGNOSIS — G44219 Episodic tension-type headache, not intractable: Secondary | ICD-10-CM

## 2015-05-13 DIAGNOSIS — I10 Essential (primary) hypertension: Secondary | ICD-10-CM

## 2015-05-13 DIAGNOSIS — J449 Chronic obstructive pulmonary disease, unspecified: Secondary | ICD-10-CM

## 2015-05-13 DIAGNOSIS — E785 Hyperlipidemia, unspecified: Secondary | ICD-10-CM

## 2015-05-13 DIAGNOSIS — Z0000 Encounter for general adult medical examination without abnormal findings: Secondary | ICD-10-CM

## 2015-05-13 DIAGNOSIS — E669 Obesity, unspecified: Secondary | ICD-10-CM

## 2015-05-13 LAB — CBC WITH DIFFERENTIAL/PLATELET
Basophils Absolute: 0 10*3/uL (ref 0.0–0.1)
Basophils Relative: 0.5 % (ref 0.0–3.0)
Eosinophils Absolute: 0.4 10*3/uL (ref 0.0–0.7)
Eosinophils Relative: 5 % (ref 0.0–5.0)
HEMATOCRIT: 35.4 % — AB (ref 36.0–46.0)
Hemoglobin: 11.8 g/dL — ABNORMAL LOW (ref 12.0–15.0)
LYMPHS ABS: 1.2 10*3/uL (ref 0.7–4.0)
Lymphocytes Relative: 15.5 % (ref 12.0–46.0)
MCHC: 33.4 g/dL (ref 30.0–36.0)
MCV: 91 fl (ref 78.0–100.0)
Monocytes Absolute: 0.5 10*3/uL (ref 0.1–1.0)
Monocytes Relative: 5.8 % (ref 3.0–12.0)
NEUTROS ABS: 5.9 10*3/uL (ref 1.4–7.7)
Neutrophils Relative %: 73.2 % (ref 43.0–77.0)
PLATELETS: 250 10*3/uL (ref 150.0–400.0)
RBC: 3.89 Mil/uL (ref 3.87–5.11)
RDW: 15.5 % (ref 11.5–15.5)
WBC: 8 10*3/uL (ref 4.0–10.5)

## 2015-05-13 LAB — BASIC METABOLIC PANEL
BUN: 31 mg/dL — ABNORMAL HIGH (ref 6–23)
CHLORIDE: 103 meq/L (ref 96–112)
CO2: 29 mEq/L (ref 19–32)
Calcium: 10.7 mg/dL — ABNORMAL HIGH (ref 8.4–10.5)
Creatinine, Ser: 1.23 mg/dL — ABNORMAL HIGH (ref 0.40–1.20)
GFR: 45.09 mL/min — ABNORMAL LOW (ref >60.00)
GLUCOSE: 94 mg/dL (ref 70–99)
Potassium: 4.8 mEq/L (ref 3.5–5.1)
Sodium: 141 mEq/L (ref 135–145)

## 2015-05-13 LAB — LIPID PANEL
Cholesterol: 168 mg/dL (ref 0–200)
HDL: 71.3 mg/dL (ref >39.00)
LDL Cholesterol: 79 mg/dL (ref 0–99)
NonHDL: 96.7
TRIGLYCERIDES: 91 mg/dL (ref 0.0–149.0)
Total CHOL/HDL Ratio: 2
VLDL: 18.2 mg/dL (ref 0.0–40.0)

## 2015-05-13 LAB — TSH: TSH: 1.97 u[IU]/mL (ref 0.35–4.50)

## 2015-05-13 MED ORDER — ASPIRIN-CAFF-DIHYDROCODEINE 356.4-30-16 MG PO CAPS: 1.0000 | ORAL_CAPSULE | Freq: Three times a day (TID) | ORAL | Status: DC | PRN

## 2015-05-13 MED ORDER — SIMVASTATIN 40 MG PO TABS: 40.0000 mg | ORAL_TABLET | Freq: Every day | ORAL | Status: DC

## 2015-05-13 NOTE — Progress Notes (Signed)
Pre visit review using our clinic review tool, if applicable. No additional management support is needed unless otherwise documented below in the visit note. 

## 2015-05-13 NOTE — Patient Instructions (Addendum)
It was good to see you today.  We have reviewed your prior records including labs and tests today  Health Maintenance reviewed - all recommended immunizations and age-appropriate screenings are up-to-date.  Test(s) ordered today. Your results will be released to MyChart (or called to you) after review, usually within 72hours after test completion. If any changes need to be made, you will be notified at that same time.  Medications reviewed and updated, no changes recommended at this time. Refill on medication(s) as discussed today.  Please schedule followup in 12 months for annual exam and labs, call sooner if problems.  Health Maintenance Adopting a healthy lifestyle and getting preventive care can go a long way to promote health and wellness. Talk with your health care provider about what schedule of regular examinations is right for you. This is a good chance for you to check in with your provider about disease prevention and staying healthy. In between checkups, there are plenty of things you can do on your own. Experts have done a lot of research about which lifestyle changes and preventive measures are most likely to keep you healthy. Ask your health care provider for more information. WEIGHT AND DIET  Eat a healthy diet  Be sure to include plenty of vegetables, fruits, low-fat dairy products, and lean protein.  Do not eat a lot of foods high in solid fats, added sugars, or salt.  Get regular exercise. This is one of the most important things you can do for your health.  Most adults should exercise for at least 150 minutes each week. The exercise should increase your heart rate and make you sweat (moderate-intensity exercise).  Most adults should also do strengthening exercises at least twice a week. This is in addition to the moderate-intensity exercise.  Maintain a healthy weight  Body mass index (BMI) is a measurement that can be used to identify possible weight problems. It  estimates body fat based on height and weight. Your health care provider can help determine your BMI and help you achieve or maintain a healthy weight.  For females 20 years of age and older:   A BMI below 18.5 is considered underweight.  A BMI of 18.5 to 24.9 is normal.  A BMI of 25 to 29.9 is considered overweight.  A BMI of 30 and above is considered obese.  Watch levels of cholesterol and blood lipids  You should start having your blood tested for lipids and cholesterol at 76 years of age, then have this test every 5 years.  You may need to have your cholesterol levels checked more often if:  Your lipid or cholesterol levels are high.  You are older than 76 years of age.  You are at high risk for heart disease.  CANCER SCREENING   Lung Cancer  Lung cancer screening is recommended for adults 55-80 years old who are at high risk for lung cancer because of a history of smoking.  A yearly low-dose CT scan of the lungs is recommended for people who:  Currently smoke.  Have quit within the past 15 years.  Have at least a 30-pack-year history of smoking. A pack year is smoking an average of one pack of cigarettes a day for 1 year.  Yearly screening should continue until it has been 15 years since you quit.  Yearly screening should stop if you develop a health problem that would prevent you from having lung cancer treatment.  Breast Cancer  Practice breast self-awareness. This means understanding   how your breasts normally appear and feel.  It also means doing regular breast self-exams. Let your health care provider know about any changes, no matter how small.  If you are in your 20s or 30s, you should have a clinical breast exam (CBE) by a health care provider every 1-3 years as part of a regular health exam.  If you are 16 or older, have a CBE every year. Also consider having a breast X-ray (mammogram) every year.  If you have a family history of breast cancer, talk  to your health care provider about genetic screening.  If you are at high risk for breast cancer, talk to your health care provider about having an MRI and a mammogram every year.  Breast cancer gene (BRCA) assessment is recommended for women who have family members with BRCA-related cancers. BRCA-related cancers include:  Breast.  Ovarian.  Tubal.  Peritoneal cancers.  Results of the assessment will determine the need for genetic counseling and BRCA1 and BRCA2 testing. Cervical Cancer Routine pelvic examinations to screen for cervical cancer are no longer recommended for nonpregnant women who are considered low risk for cancer of the pelvic organs (ovaries, uterus, and vagina) and who do not have symptoms. A pelvic examination may be necessary if you have symptoms including those associated with pelvic infections. Ask your health care provider if a screening pelvic exam is right for you.   The Pap test is the screening test for cervical cancer for women who are considered at risk.  If you had a hysterectomy for a problem that was not cancer or a condition that could lead to cancer, then you no longer need Pap tests.  If you are older than 65 years, and you have had normal Pap tests for the past 10 years, you no longer need to have Pap tests.  If you have had past treatment for cervical cancer or a condition that could lead to cancer, you need Pap tests and screening for cancer for at least 20 years after your treatment.  If you no longer get a Pap test, assess your risk factors if they change (such as having a new sexual partner). This can affect whether you should start being screened again.  Some women have medical problems that increase their chance of getting cervical cancer. If this is the case for you, your health care provider may recommend more frequent screening and Pap tests.  The human papillomavirus (HPV) test is another test that may be used for cervical cancer screening.  The HPV test looks for the virus that can cause cell changes in the cervix. The cells collected during the Pap test can be tested for HPV.  The HPV test can be used to screen women 80 years of age and older. Getting tested for HPV can extend the interval between normal Pap tests from three to five years.  An HPV test also should be used to screen women of any age who have unclear Pap test results.  After 76 years of age, women should have HPV testing as often as Pap tests.  Colorectal Cancer  This type of cancer can be detected and often prevented.  Routine colorectal cancer screening usually begins at 76 years of age and continues through 76 years of age.  Your health care provider may recommend screening at an earlier age if you have risk factors for colon cancer.  Your health care provider may also recommend using home test kits to check for hidden blood in  the stool.  A small camera at the end of a tube can be used to examine your colon directly (sigmoidoscopy or colonoscopy). This is done to check for the earliest forms of colorectal cancer.  Routine screening usually begins at age 77.  Direct examination of the colon should be repeated every 5-10 years through 76 years of age. However, you may need to be screened more often if early forms of precancerous polyps or small growths are found. Skin Cancer  Check your skin from head to toe regularly.  Tell your health care provider about any new moles or changes in moles, especially if there is a change in a mole's shape or color.  Also tell your health care provider if you have a mole that is larger than the size of a pencil eraser.  Always use sunscreen. Apply sunscreen liberally and repeatedly throughout the day.  Protect yourself by wearing long sleeves, pants, a wide-brimmed hat, and sunglasses whenever you are outside. HEART DISEASE, DIABETES, AND HIGH BLOOD PRESSURE   Have your blood pressure checked at least every 1-2  years. High blood pressure causes heart disease and increases the risk of stroke.  If you are between 60 years and 29 years old, ask your health care provider if you should take aspirin to prevent strokes.  Have regular diabetes screenings. This involves taking a blood sample to check your fasting blood sugar level.  If you are at a normal weight and have a low risk for diabetes, have this test once every three years after 76 years of age.  If you are overweight and have a high risk for diabetes, consider being tested at a younger age or more often. PREVENTING INFECTION  Hepatitis B  If you have a higher risk for hepatitis B, you should be screened for this virus. You are considered at high risk for hepatitis B if:  You were born in a country where hepatitis B is common. Ask your health care provider which countries are considered high risk.  Your parents were born in a high-risk country, and you have not been immunized against hepatitis B (hepatitis B vaccine).  You have HIV or AIDS.  You use needles to inject street drugs.  You live with someone who has hepatitis B.  You have had sex with someone who has hepatitis B.  You get hemodialysis treatment.  You take certain medicines for conditions, including cancer, organ transplantation, and autoimmune conditions. Hepatitis C  Blood testing is recommended for:  Everyone born from [birth year] through 1967.  Anyone with known risk factors for hepatitis C. Sexually transmitted infections (STIs)  You should be screened for sexually transmitted infections (STIs) including gonorrhea and chlamydia if:  You are sexually active and are younger than 76 years of age.  You are older than 76 years of age and your health care provider tells you that you are at risk for this type of infection.  Your sexual activity has changed since you were last screened and you are at an increased risk for chlamydia or gonorrhea. Ask your health care provider if  you are at risk.  If you do not have HIV, but are at risk, it may be recommended that you take a prescription medicine daily to prevent HIV infection. This is called pre-exposure prophylaxis (PrEP). You are considered at risk if:  You are sexually active and do not regularly use condoms or know the HIV status of your partner(s).  You take drugs by injection.  You  are sexually active with a partner who has HIV. Talk with your health care provider about whether you are at high risk of being infected with HIV. If you choose to begin PrEP, you should first be tested for HIV. You should then be tested every 3 months for as long as you are taking PrEP.  PREGNANCY   If you are premenopausal and you may become pregnant, ask your health care provider about preconception counseling.  If you may become pregnant, take 400 to 800 micrograms (mcg) of folic acid every day.  If you want to prevent pregnancy, talk to your health care provider about birth control (contraception). OSTEOPOROSIS AND MENOPAUSE   Osteoporosis is a disease in which the bones lose minerals and strength with aging. This can result in serious bone fractures. Your risk for osteoporosis can be identified using a bone density scan.  If you are 65 years of age or older, or if you are at risk for osteoporosis and fractures, ask your health care provider if you should be screened.  Ask your health care provider whether you should take a calcium or vitamin D supplement to lower your risk for osteoporosis.  Menopause may have certain physical symptoms and risks.  Hormone replacement therapy may reduce some of these symptoms and risks. Talk to your health care provider about whether hormone replacement therapy is right for you.  HOME CARE INSTRUCTIONS   Schedule regular health, dental, and eye exams.  Stay current with your immunizations.   Do not use any tobacco products including cigarettes, chewing tobacco, or electronic  cigarettes.  If you are pregnant, do not drink alcohol.  If you are breastfeeding, limit how much and how often you drink alcohol.  Limit alcohol intake to no more than 1 drink per day for nonpregnant women. One drink equals 12 ounces of beer, 5 ounces of wine, or 1 ounces of hard liquor.  Do not use street drugs.  Do not share needles.  Ask your health care provider for help if you need support or information about quitting drugs.  Tell your health care provider if you often feel depressed.  Tell your health care provider if you have ever been abused or do not feel safe at home. Document Released: 05/10/2011 Document Revised: 03/11/2014 Document Reviewed: 09/26/2013 Exit[employer] Patient Information 2015 [employer]. This information is not intended to replace advice given to you by your health care provider. Make sure you discuss any questions you have with your health care provider.  

## 2015-05-13 NOTE — Assessment & Plan Note (Signed)
BP Readings from Last 3 Encounters:  05/13/15 138/84  05/07/14 126/78  11/06/13 140/88   Beta blocker dose reduction spring 2014 by pulmonary in setting of new mild COPD diagnosis reviewed hx renal insuff summer 2012 related to diuretic/ARB use - increased ARB and diuretic dose 04/2013 - Cr stable but will monitor -labs today

## 2015-05-13 NOTE — Progress Notes (Signed)
Subjective:    Patient ID: Joy Martin, female    DOB: [birth year], 74 y.o.   MRN: [id number]  HPI   Here for medicare wellness  Diet: heart healthy  Physical activity: sedentary Depression/mood screen: negative Hearing: intact to whispered voice Visual acuity: grossly normal, performs annual eye exam  ADLs: capable Fall risk: none Home safety: good Cognitive evaluation: intact to orientation, naming, recall and repetition EOL planning: adv directives, full code/ I agree  I have personally reviewed and have noted 1. The patient's medical and social history 2. Their use of alcohol, tobacco or illicit drugs 3. Their current medications and supplements 4. The patient's functional ability including ADL's, fall risks, home safety risks and hearing or visual impairment. 5. Diet and physical activities 6. Evidence for depression or mood disorders  Also reviewed chronic conditions, interval events and current concerns   Past Medical History  Diagnosis Date  . ALLERGIC RHINITIS, CHRONIC   . ANEMIA   . Hypertension   . EXOGENOUS OBESITY   . GERD   . HYPERLIPIDEMIA   . OSTEOPENIA   . Episodic tension type headache   . Migraines 07/10/2012    "over; last one was @ age 55"  . Arthritis     "knees mainly; thumbs"  . COPD (chronic obstructive pulmonary disease) dx 2013    GOLD 1, follows with pulm for same  . Chronic sinusitis     follows with ENT for same  . 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 Prostate cancer Father   . Cancer Father     prostate  . Alcohol abuse Other   . Heart disease Other   . Hypertension Other   . Diabetes Other   . Pancreatic cancer Other   . Cancer Mother     waldenstruns microgobular anemia   History  Substance Use Topics  . Smoking status: Former Smoker -- 0.75 packs/day for 20 years    Types: Cigarettes    Quit date:  07/25/1982  . Smokeless tobacco: Never Used     Comment: 07/10/2012 "stopped smoking cigarettes 1980's"  . Alcohol Use: Yes     Comment: 07/10/2012 "glass of wine 1-2X/yr"    Review of Systems  Constitutional: Positive for fatigue. Negative for unexpected weight change.  Respiratory: Negative for cough, shortness of breath and wheezing.   Cardiovascular: Negative for chest pain, palpitations and leg swelling.  Gastrointestinal: Negative for nausea, abdominal pain and diarrhea.  Neurological: Negative for dizziness, weakness, light-headedness and headaches.  Psychiatric/Behavioral: Negative for dysphoric mood. The patient is not nervous/anxious.   All other systems reviewed and are negative.  Patient Care Team: Rowe Clack, MD as PCP - General (Internal Medicine) Tanda Rockers, MD (Pulmonary Disease) Melissa Montane, MD (Otolaryngology)     Objective:    Physical Exam  Constitutional: She appears well-developed and well-nourished. No distress.  obese  Cardiovascular: Normal rate, regular rhythm and normal heart sounds.   No murmur heard. Pulmonary/Chest: Effort normal and breath sounds normal. No respiratory distress.  Musculoskeletal: She exhibits no edema.    BP 138/84 mmHg  Pulse 57  Temp(Src) 97.9 F (36.6 C) (Oral)  Ht 5\' 2"  (1.575 m)  Wt 178 lb 8 oz (80.967 kg)  BMI 32.64 kg/m2  SpO2 94% Wt Readings from Last 3 Encounters:  05/13/15 178 lb 8 oz (80.967 kg)  05/07/14 181  lb 12.8 oz (82.464 kg)  11/06/13 179 lb 12.8 oz (81.557 kg)     Lab Results  Component Value Date   WBC 5.2 11/06/2013   HGB 11.7* 11/06/2013   HCT 35.5* 11/06/2013   PLT 298.0 11/06/2013   GLUCOSE 110* 05/07/2014   CHOL 172 05/07/2014   TRIG 97.0 05/07/2014   HDL 78.80 05/07/2014   LDLDIRECT 143.2 12/19/2007   LDLCALC 74 05/07/2014   ALT 24 11/06/2013   AST 28 11/06/2013   NA 140 05/07/2014   K 4.2 05/07/2014   CL 103 05/07/2014   CREATININE 1.1 05/07/2014   BUN 20 05/07/2014    CO2 30 05/07/2014   TSH 2.06 11/06/2013   HGBA1C 6.5 10/25/2012    Dg Chest 2 View  06/28/2013   *RADIOLOGY REPORT*  Clinical Data: Chest congestion and COPD  CHEST - 2 VIEW  Comparison: February 09, 2013  Findings: There is underlying emphysema.  There is mild scarring in the bases.  There is no edema or consolidation. The previous opacity in the right upper lobe is not well seen at this time. There is some slight scarring in this area currently.  The heart size is within normal limits.  Pulmonary vascularity reflects underlying emphysema.  There is a hiatal hernia. No adenopathy.  IMPRESSION: Underlying emphysema.  Areas of scarring bilaterally. No frank edema or consolidation appreciated on this study.  Hiatal hernia present.   Original Report Authenticated By: Lowella Grip, M.D.       Assessment & Plan:   AWV/z00.00 - Today patient counseled on age appropriate routine health concerns for screening and prevention, each reviewed and up to date or declined. Immunizations reviewed and up to date or declined. Labs ordered and reviewed. Risk factors for depression reviewed and negative. Hearing function and visual acuity are intact. ADLs screened and addressed as needed. Functional ability and level of safety reviewed and appropriate. Education, counseling and referrals performed based on assessed risks today. Patient provided with a copy of personalized plan for preventive services.  Problem List Items Addressed This Visit    COPD GOLD I    Previous evaluation by pulmonary reviewed, not currently on maintenance medications nor is she having daily symptoms are intermittent flare Will resolve problem at this time pending redevelopment of symptoms      EPISODIC TENSION TYPE HEADACHE    Will change butalbital w/ codiene to formulary Synalgos DC for prn use when Excedrin OTC ineffective - uses <4/month      Relevant Medications   Aspirin-Caff-Dihydrocodeine 356.03-08-15 MG CAPS   Other Relevant  Orders   TSH   CBC with Differential/Platelet   ESSENTIAL HYPERTENSION, BENIGN    BP Readings from Last 3 Encounters:  05/13/15 138/84  05/07/14 126/78  11/06/13 140/88   Beta blocker dose reduction spring 2014 by pulmonary in setting of new mild COPD diagnosis reviewed 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      Relevant Medications   simvastatin (ZOCOR) 40 MG tablet  Other Relevant Orders   Lipid panel   CBC with Differential/Platelet   Obesity    Wt Readings from Last 3 Encounters:  05/13/15 178 lb 8 oz (80.967 kg)  05/07/14 181 lb 12.8 oz (82.464 kg)  11/06/13 179 lb 12.8 oz (81.557 kg)   Weight trend reviewed Working with [employer] since 03/2012, intermittently The patient is asked to make an attempt to improve diet and exercise patterns to aid in medical management of this problem.      Osteopenia    On calcium and fosamax added summer 2013 encouraged weight bearing exercise  DEXAs reviewed @ [facility] from 05/2014 - continued slow loss Consider change in bisphosphate therapy if accelerated loss or other complication arises      Relevant Orders   TSH   CBC with Differential/Platelet    Other Visit Diagnoses    Routine general medical examination at a health care facility    -  Primary        Gwendolyn Grant, MD

## 2015-05-13 NOTE — Assessment & Plan Note (Signed)
Will change butalbital w/ codiene to formulary Synalgos DC for prn use when Excedrin OTC ineffective - uses <4/month

## 2015-05-13 NOTE — Assessment & Plan Note (Signed)
Wt Readings from Last 3 Encounters:  05/13/15 178 lb 8 oz (80.967 kg)  05/07/14 181 lb 12.8 oz (82.464 kg)  11/06/13 179 lb 12.8 oz (81.557 kg)   Weight trend reviewed Working with [employer] since 03/2012, intermittently The patient is asked to make an attempt to improve diet and exercise patterns to aid in medical management of this problem.

## 2015-05-13 NOTE — Assessment & Plan Note (Signed)
Previous evaluation by pulmonary reviewed, not currently on maintenance medications nor is she having daily symptoms are intermittent flare Will resolve problem at this time pending redevelopment of symptoms

## 2015-05-13 NOTE — Assessment & Plan Note (Signed)
On calcium and fosamax added summer 2013 encouraged weight bearing exercise  DEXAs reviewed @ [facility] from 05/2014 - continued slow loss Consider change in bisphosphate therapy if accelerated loss or other complication arises

## 2015-05-13 NOTE — Assessment & Plan Note (Signed)
On statin - check annually Last lipids reviewed The patient is asked to make an attempt to improve diet and exercise patterns to aid in medical management of this problem.

## 2015-05-15 ENCOUNTER — Telehealth: Payer: Self-pay

## 2015-05-15 NOTE — Telephone Encounter (Signed)
PA for butalbital/acetaminophen/caffeine/codeine was denied:  Product is on the high risk medicaiton list and is not recommended in pt 76 yo and older.  Pt needs to first try [facility]

## 2015-05-19 NOTE — Telephone Encounter (Signed)
Synalgos-DC is what I intended to prescribe - may have gotten "translated" differently by [facility] and [employer] [facility] as on med list here [place] to call in Synalgos DC 1 tab q8h prn tension HA #40, no refills thanks

## 2015-05-27 NOTE — Telephone Encounter (Signed)
error 

## 2015-06-29 ENCOUNTER — Other Ambulatory Visit: Payer: Self-pay | Admitting: Internal Medicine

## 2015-07-16 ENCOUNTER — Encounter: Payer: Self-pay | Admitting: Internal Medicine

## 2015-07-16 ENCOUNTER — Other Ambulatory Visit: Payer: Self-pay | Admitting: Internal Medicine

## 2015-07-17 ENCOUNTER — Other Ambulatory Visit: Payer: Self-pay

## 2015-07-17 MED ORDER — FLUTICASONE PROPIONATE 50 MCG/ACT NA SUSP: 1.0000 | Freq: Every day | NASAL | Status: DC

## 2015-07-21 MED ORDER — BUTALBITAL-APAP-CAFF-COD 50-325-40-30 MG PO CAPS: 1.0000 | ORAL_CAPSULE | ORAL | Status: DC | PRN

## 2015-07-21 NOTE — Telephone Encounter (Signed)
rx printed and signed

## 2015-09-11 ENCOUNTER — Other Ambulatory Visit: Payer: Self-pay | Admitting: Internal Medicine

## 2015-09-21 ENCOUNTER — Other Ambulatory Visit: Payer: Self-pay | Admitting: Internal Medicine

## 2015-10-19 ENCOUNTER — Other Ambulatory Visit: Payer: Self-pay | Admitting: Internal Medicine

## 2015-11-05 ENCOUNTER — Encounter: Payer: Self-pay | Admitting: Internal Medicine

## 2015-11-05 ENCOUNTER — Other Ambulatory Visit: Payer: Self-pay | Admitting: Internal Medicine

## 2015-11-06 NOTE — Telephone Encounter (Signed)
Please call to schedule ROV to est with new PCP = Burns as requested Thanks !

## 2015-11-16 ENCOUNTER — Encounter: Payer: Self-pay | Admitting: Internal Medicine

## 2015-11-17 NOTE — Telephone Encounter (Signed)
At [facility] okay for the refill of Fioricet. pls advise if you would like to sign for this or if I should have another provider sign.

## 2015-11-18 ENCOUNTER — Other Ambulatory Visit: Payer: Self-pay | Admitting: Internal Medicine

## 2015-11-24 MED ORDER — BUTALBITAL-APAP-CAFF-COD 50-325-40-30 MG PO CAPS: 1.0000 | ORAL_CAPSULE | ORAL | Status: DC | PRN

## 2015-11-24 NOTE — Telephone Encounter (Signed)
Will sign rx for 30 pills, need visit for any more. Last note from her MD states she uses 4 per month but review of [place] [facility] shows she is filling 30 (previously filling 100) per month which does not add up with 4/month. Needs visit to discuss how often she is using if she is really taking it more.

## 2015-11-26 ENCOUNTER — Ambulatory Visit (INDEPENDENT_AMBULATORY_CARE_PROVIDER_SITE_OTHER): Payer: Medicare Other | Admitting: Internal Medicine

## 2015-11-26 ENCOUNTER — Encounter: Payer: Self-pay | Admitting: Internal Medicine

## 2015-11-26 VITALS — BP 128/76 | HR 64 | Temp 98.4°F | Resp 16 | Ht 62.0 in | Wt 175.0 lb

## 2015-11-26 DIAGNOSIS — K219 Gastro-esophageal reflux disease without esophagitis: Secondary | ICD-10-CM

## 2015-11-26 DIAGNOSIS — M8580 Other specified disorders of bone density and structure, unspecified site: Secondary | ICD-10-CM | POA: Diagnosis not present

## 2015-11-26 DIAGNOSIS — C4491 Basal cell carcinoma of skin, unspecified: Secondary | ICD-10-CM | POA: Diagnosis not present

## 2015-11-26 DIAGNOSIS — E785 Hyperlipidemia, unspecified: Secondary | ICD-10-CM | POA: Diagnosis not present

## 2015-11-26 DIAGNOSIS — I10 Essential (primary) hypertension: Secondary | ICD-10-CM

## 2015-11-26 DIAGNOSIS — G44219 Episodic tension-type headache, not intractable: Secondary | ICD-10-CM

## 2015-11-26 DIAGNOSIS — Z85828 Personal history of other malignant neoplasm of skin: Secondary | ICD-10-CM | POA: Insufficient documentation

## 2015-11-26 DIAGNOSIS — N959 Unspecified menopausal and perimenopausal disorder: Secondary | ICD-10-CM

## 2015-11-26 MED ORDER — BUTALBITAL-APAP-CAFF-COD 50-325-40-30 MG PO CAPS: 1.0000 | ORAL_CAPSULE | ORAL | Status: DC | PRN

## 2015-11-26 NOTE — Assessment & Plan Note (Signed)
Many other medications have not worked She first uses Excedrin and if that isn't effective she will then use Fioricet-she knows not to exceed daily limit and try to avoid taking it regularly Refill given today

## 2015-11-26 NOTE — Assessment & Plan Note (Signed)
Taking estrogen and Paxil low-dose She is unsure if either are working She can try discontinuing the Paxil at any time-if it is not helping and I'll need to restart, but can restart if she feels it is helping

## 2015-11-26 NOTE — Telephone Encounter (Signed)
Called pt before appt with Dr. Quay Burow today. Pt requested to pick up the rx here at the office since she was coming in.   Otherwise, pt would like for the rx to be sent to [facility]

## 2015-11-26 NOTE — Progress Notes (Signed)
Subjective:    Patient ID: Joy Martin, female    DOB: 1941/06/22, 75 y.o.   MRN: [id number]  HPI She is here to establish with a new pcp.  She has no major concerns or questions.  Hypertension: She is taking her medication daily. She is compliant with a low sodium diet.  She denies chest pain, palpitations, edema and lightheadedness. She is exercising regularly.  She does experience some shortness of breath with certain activities, but this is not new.  GERD:  She is taking her medication daily as prescribed.  She denies any GERD symptoms and feels her GERD is well controlled. On occasion she will take the medication second time, but typically only takes it once a day.  Hyperlipidemia: She is taking her medication once daily. She is compliant with a low fat/cholesterol diet. She is exercising regularly. She denies myalgias.   Episodic tension headaches/?migraines:  She takes fioricet with codeine as needed.  She tries excedrin first and if that does not work she uses the fioricet with codeine.  She can take it daily or can go several days without it. She has tried several medications in the past and they do not work. It is not covered by her insurance and she is willing to pay for it.  She used to get migraines regularly with her menses and wonders if some of her headaches are still migraines. She definitely gets tension headaches..    Osteopenia:  She is exercising.  She was put on fosamax in 2013.  She is taking calckum and vitamin d daily.  Her next bone density should be this summer.  Medications and allergies reviewed with patient and updated if appropriate.  Patient Active Problem List   Diagnosis Date Noted  . Abnormal CXR 01/27/2013  . Postmenopausal symptoms   . Obesity 12/25/2009  . EPISODIC TENSION TYPE HEADACHE 12/24/2008  . ESSENTIAL HYPERTENSION, BENIGN 12/24/2008  . ALLERGIC RHINITIS, CHRONIC 12/24/2008  . Hyperlipidemia 12/19/2007  . ANEMIA 12/19/2007  . GERD  12/19/2007  . ARTHRITIS 12/19/2007  . Osteopenia 12/19/2007    Current Outpatient Prescriptions on File Prior to Visit  Medication Sig Dispense Refill  . alendronate (FOSAMAX) 70 MG tablet Take 1 tablet (70 mg total) by mouth once a week. 12 tablet 3  . aspirin 81 MG tablet Take 81 mg by mouth daily.      . butalbital-acetaminophen-caffeine (FIORICET WITH CODEINE) 50-325-40-30 MG capsule Take 1 capsule by mouth every 4 (four) hours as needed for headache or migraine. Do not exceed 4 capsules in a 24 hours period 30 capsule 0  . Calcium Carbonate (CALCIUM 500 PO) Take by mouth.      . cholecalciferol (VITAMIN D) 1000 UNITS tablet Take 1,000 Units by mouth daily.    . ciclopirox (LOPROX) 0.77 % cream Apply 1 application topically 2 (two) times daily as needed. rash    . cycloSPORINE (RESTASIS) 0.05 % ophthalmic emulsion 1 drop 2 (two) times daily.      Marland Kitchen esomeprazole (NEXIUM) 40 MG capsule TAKE ONE CAPSULE BY MOUTH TWICE A DAY BEFORE A MEAL (Patient taking differently: TAKE ONE CAPSULE BY MOUTH A DAY BEFORE A MEAL) 180 capsule 2  . Flaxseed, Linseed, (FLAXSEED OIL PO) Take 2 tablets by mouth daily.    . fluticasone (FLONASE) 50 MCG/ACT nasal spray Place 1 spray into both nostrils daily. 16 g 3  . Multiple Vitamins-Minerals (CENTRUM SILVER PO) Take 1 tablet by mouth daily.    . nabumetone (  RELAFEN) 750 MG tablet Take 1 tablet (750 mg total) by mouth 2 (two) times daily as needed. Est with new PCP for additional refills. (Patient taking differently: Take 750 mg by mouth daily. Est with new PCP for additional refills.) 60 tablet 3  . nadolol (CORGARD) 40 MG tablet Take 1 tablet (40 mg total) by mouth 2 (two) times daily. 180 tablet 2  . PARoxetine (PAXIL) 10 MG tablet TAKE 1 TABLET BY MOUTH EVERY DAY 90 tablet 1  . simvastatin (ZOCOR) 40 MG tablet Take 1 tablet (40 mg total) by mouth at bedtime. 90 tablet 3  . UNABLE TO FIND 1 tablet daily. Med Name: Carole Binning    . valsartan-hydrochlorothiazide  (DIOVAN-HCT) 320-12.5 MG tablet TAKE 1 TABLET BY MOUTH DAILY. 90 tablet 2   No current facility-administered medications on file prior to visit.    Past Medical History  Diagnosis Date  . ALLERGIC RHINITIS, CHRONIC   . ANEMIA   . Hypertension   . EXOGENOUS OBESITY   . GERD   . HYPERLIPIDEMIA   . OSTEOPENIA   . Episodic tension type headache   . Migraines 07/10/2012    "over; last one was @ age 39"  . Arthritis     "knees mainly; thumbs"  . COPD (chronic obstructive pulmonary disease) ([place]) dx 2013    GOLD 1, follows with pulm for same  . Chronic sinusitis     follows with ENT for same  . 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 Mole removal  1957    "my back"  . Wisdom tooth extraction  1959  . Appendectomy  07/10/2012  . Tonsillectomy and adenoidectomy  1946  . Breast cyst aspiration  ?1980's    right  . Knee arthroscopy  1970's or 1980's    right; torn cartilage  . Skin cancer excision      "2 on my back; 2 on my face"  . Laparoscopic appendectomy  07/10/2012    Procedure: APPENDECTOMY LAPAROSCOPIC;  Surgeon: Harl Bowie, MD;  Location: [place];  Service: General;  Laterality: N/A;  . Eye surgery      both eyes    Social History   Social History  . Marital Status: Single    Spouse Name: N/A  . Number of Children: N/A  . Years of Education: N/A   Social History Main Topics  . Smoking status: Former Smoker -- 0.75 packs/day for 20 years    Types: Cigarettes    Quit date: 07/25/1982  . Smokeless tobacco: Never Used     Comment: 07/10/2012 "stopped smoking cigarettes 1980's"  . Alcohol Use: Yes     Comment: 07/10/2012 "glass of wine 1-2X/yr"  . Drug Use: No  . Sexual Activity: No   Other Topics Concern  . None   Social History Narrative   Retired Pharmacist, hospital    Family History  Problem Relation Age of Onset  . Prostate cancer Father   . Cancer  Father     prostate  . Alcohol abuse Other   . Heart disease Other   . Hypertension Other   . Diabetes Other   . Pancreatic cancer Other   . Cancer Mother     waldenstruns microgobular anemia    Review of Systems  Constitutional: Negative for fever and chills.       Hot flashes  Respiratory: Positive for cough (occasional)  and shortness of breath (certain activities). Negative for wheezing.   Cardiovascular: Negative for chest pain, palpitations and leg swelling.  Gastrointestinal: Negative for nausea and abdominal pain.       GERD controlled  Neurological: Positive for headaches. Negative for dizziness and light-headedness.       Objective:   Filed Vitals:   11/26/15 1120  BP: 128/76  Pulse: 64  Temp: 98.4 F (36.9 C)  Resp: 16   Filed Weights   11/26/15 1120  Weight: 175 lb (79.379 kg)   Body mass index is 32 kg/(m^2).   Physical Exam Constitutional: Appears well-developed and well-nourished. No distress.  Neck: Neck supple. No tracheal deviation present. No thyromegaly present.  No carotid bruit. No cervical adenopathy.   Cardiovascular: Normal rate, regular rhythm and normal heart sounds.   1/6 systolic murmur.  No edema Pulmonary/Chest: Effort normal and breath sounds normal. No respiratory distress. No wheezes.       Assessment & Plan:   See Problem List for Assessment and Plan of chronic medical problems.  Follow-up in the fall for physical exam, sooner if needed

## 2015-11-26 NOTE — Assessment & Plan Note (Signed)
Lipid panel controlled Continue simvastatin Recheck lipid panel with physical this summer

## 2015-11-26 NOTE — Progress Notes (Signed)
Pre visit review using our clinic review tool, if applicable. No additional management support is needed unless otherwise documented below in the visit note. 

## 2015-11-26 NOTE — Patient Instructions (Addendum)
All other Health Maintenance issues reviewed.   All recommended immunizations and age-appropriate screenings are up-to-date.  No immunizations administered today.   Medications reviewed and updated.  No changes recommended at this time.  Please followup for a yearly physical

## 2015-11-26 NOTE — Assessment & Plan Note (Signed)
DEXA due this summer-referral ordered Continue calcium and vitamin D daily Stressed regular walking  continue Fosamax for now-we'll consider discontinuing after next DEXA

## 2015-11-26 NOTE — Assessment & Plan Note (Signed)
Blood pressure well-controlled here today Continue current medication Continue regular exercise and low sodium diet

## 2015-11-26 NOTE — Assessment & Plan Note (Signed)
controlled with once daily Nexium, occasionally takes a second pill Continue same

## 2015-12-03 ENCOUNTER — Encounter: Payer: Self-pay | Admitting: Internal Medicine

## 2016-01-07 HISTORY — PX: MOHS SURGERY: SUR867

## 2016-01-28 ENCOUNTER — Other Ambulatory Visit: Payer: Self-pay | Admitting: Internal Medicine

## 2016-01-28 MED ORDER — BUTALBITAL-APAP-CAFF-COD 50-325-40-30 MG PO CAPS
1.0000 | ORAL_CAPSULE | ORAL | Status: DC | PRN
Start: 2016-01-28 — End: 2016-03-30

## 2016-01-28 NOTE — Addendum Note (Signed)
Addended by: [name] on: 01/28/2016 07:39 PM   Modules accepted: Orders

## 2016-01-28 NOTE — Telephone Encounter (Signed)
rx printed

## 2016-01-29 NOTE — Telephone Encounter (Signed)
Faxed script back to [facility].../lmb 

## 2016-03-30 ENCOUNTER — Other Ambulatory Visit: Payer: Self-pay | Admitting: Internal Medicine

## 2016-03-30 NOTE — Telephone Encounter (Signed)
Call her -- she is using a lot of the medication which could be causing some rebound headaches.  Has she seen neuro in the past to evaluate her headaches?  I am worried about how much of the medication she is taking. I did refill the prescription, but I think she needs to cut back on how much she is taking.

## 2016-03-31 NOTE — Telephone Encounter (Signed)
Spoke with pt to inform of MDs response. RX faxed to [facility]

## 2016-04-17 ENCOUNTER — Telehealth: Payer: Self-pay

## 2016-04-17 NOTE — Telephone Encounter (Signed)
Patient is on the list for [employer] 2017 and may be a good candidate for an AWV in 2017. Please let me know if/when appt is scheduled.   -pt has an appt with previous PCP in July. Pt may come in for AWV with Manuela Schwartz instead.

## 2016-04-21 ENCOUNTER — Encounter: Payer: Self-pay | Admitting: Internal Medicine

## 2016-04-22 NOTE — Telephone Encounter (Signed)
Call to the patient to fup regarding AWV; did agreed to schedule 7/5 at 10am; dr. leschber's fup apt at that time and date was cancelled. Dr. Quay Burow is ongoing provider and apt scheduled for fall with Dr. Quay Burow

## 2016-04-22 NOTE — Telephone Encounter (Signed)
Can you make sure her referral was sent to [facility].  Thanks.

## 2016-04-26 ENCOUNTER — Telehealth: Payer: Self-pay | Admitting: Emergency Medicine

## 2016-04-30 NOTE — Telephone Encounter (Signed)
A user error has taken place: Not sure why encounter open. Closing encounter...Joy Martin

## 2016-05-04 ENCOUNTER — Other Ambulatory Visit: Payer: Self-pay | Admitting: Internal Medicine

## 2016-05-12 ENCOUNTER — Ambulatory Visit: Payer: Medicare Other

## 2016-05-12 ENCOUNTER — Ambulatory Visit: Payer: Medicare Other | Admitting: Internal Medicine

## 2016-05-16 ENCOUNTER — Other Ambulatory Visit: Payer: Self-pay | Admitting: Internal Medicine

## 2016-05-17 ENCOUNTER — Telehealth: Payer: Self-pay

## 2016-05-17 NOTE — Telephone Encounter (Signed)
To reschedule AWV from [facility] to other date due to conflict. Left direct number for call back [phone]

## 2016-05-18 NOTE — Telephone Encounter (Signed)
Agreed to AWV at 1:15 ; 9/20 prior to CPE with Dr. Quay Burow at 2pm

## 2016-05-19 ENCOUNTER — Ambulatory Visit: Payer: Medicare Other

## 2016-05-27 LAB — HM DEXA SCAN

## 2016-06-01 ENCOUNTER — Encounter: Payer: Self-pay | Admitting: Internal Medicine

## 2016-06-10 ENCOUNTER — Other Ambulatory Visit: Payer: Self-pay | Admitting: Internal Medicine

## 2016-06-11 ENCOUNTER — Other Ambulatory Visit: Payer: Self-pay | Admitting: Internal Medicine

## 2016-06-17 ENCOUNTER — Telehealth: Payer: Self-pay | Admitting: Internal Medicine

## 2016-06-17 NOTE — Telephone Encounter (Signed)
She did not read her mychart message:    Shela,   Your bone density scan showed that you have osteopenia, which is some thinning of your bones. This does increase your risk of a fracture.   To maintain or improve your bone density you should exercise regularly and make sure you are getting about 1000-2000 units of vitamin D daily. Ideally you should be getting approximately 1200 mg of calcium daily in a combination of food and supplements, but no more than 600 mg should come from supplements. Dairy products, dark green vegetables, beans and oily fish, such as salmon, are good sources of calcium.   You should have another bone density scan in three years to reevaluate. Please do not hesitate to call with any questions.   Billey Gosling    Please send her a letter.  Thanks

## 2016-06-21 NOTE — Telephone Encounter (Signed)
LVM for pt to call back if she had any questions after reviewing the results on mychart.

## 2016-07-05 ENCOUNTER — Other Ambulatory Visit: Payer: Self-pay | Admitting: Internal Medicine

## 2016-07-07 ENCOUNTER — Other Ambulatory Visit: Payer: Self-pay | Admitting: Emergency Medicine

## 2016-07-07 ENCOUNTER — Other Ambulatory Visit: Payer: Self-pay | Admitting: Internal Medicine

## 2016-07-07 NOTE — Telephone Encounter (Signed)
Faxed script back to [facility].../lmb 

## 2016-07-07 NOTE — Telephone Encounter (Signed)
RX for Fioricet with codeine

## 2016-07-28 ENCOUNTER — Ambulatory Visit (INDEPENDENT_AMBULATORY_CARE_PROVIDER_SITE_OTHER): Payer: Medicare Other | Admitting: Internal Medicine

## 2016-07-28 ENCOUNTER — Other Ambulatory Visit (INDEPENDENT_AMBULATORY_CARE_PROVIDER_SITE_OTHER): Payer: Medicare Other

## 2016-07-28 VITALS — BP 142/82 | HR 63 | Temp 97.7°F | Ht 61.0 in | Wt 171.5 lb

## 2016-07-28 DIAGNOSIS — M8580 Other specified disorders of bone density and structure, unspecified site: Secondary | ICD-10-CM

## 2016-07-28 DIAGNOSIS — E785 Hyperlipidemia, unspecified: Secondary | ICD-10-CM

## 2016-07-28 DIAGNOSIS — G44219 Episodic tension-type headache, not intractable: Secondary | ICD-10-CM

## 2016-07-28 DIAGNOSIS — Z0000 Encounter for general adult medical examination without abnormal findings: Secondary | ICD-10-CM | POA: Diagnosis not present

## 2016-07-28 DIAGNOSIS — K219 Gastro-esophageal reflux disease without esophagitis: Secondary | ICD-10-CM

## 2016-07-28 DIAGNOSIS — I10 Essential (primary) hypertension: Secondary | ICD-10-CM | POA: Diagnosis not present

## 2016-07-28 DIAGNOSIS — M1711 Unilateral primary osteoarthritis, right knee: Secondary | ICD-10-CM

## 2016-07-28 DIAGNOSIS — N959 Unspecified menopausal and perimenopausal disorder: Secondary | ICD-10-CM

## 2016-07-28 DIAGNOSIS — E669 Obesity, unspecified: Secondary | ICD-10-CM

## 2016-07-28 DIAGNOSIS — C4491 Basal cell carcinoma of skin, unspecified: Secondary | ICD-10-CM

## 2016-07-28 LAB — CBC WITH DIFFERENTIAL/PLATELET
BASOS PCT: 0.4 % (ref 0.0–3.0)
Basophils Absolute: 0 10*3/uL (ref 0.0–0.1)
EOS PCT: 7.6 % — AB (ref 0.0–5.0)
Eosinophils Absolute: 0.6 10*3/uL (ref 0.0–0.7)
HEMATOCRIT: 36 % (ref 36.0–46.0)
HEMOGLOBIN: 12.1 g/dL (ref 12.0–15.0)
LYMPHS PCT: 17.3 % (ref 12.0–46.0)
Lymphs Abs: 1.3 10*3/uL (ref 0.7–4.0)
MCHC: 33.7 g/dL (ref 30.0–36.0)
MCV: 90.9 fl (ref 78.0–100.0)
MONOS PCT: 8.9 % (ref 3.0–12.0)
Monocytes Absolute: 0.7 10*3/uL (ref 0.1–1.0)
Neutro Abs: 5.1 10*3/uL (ref 1.4–7.7)
Neutrophils Relative %: 65.8 % (ref 43.0–77.0)
Platelets: 355 10*3/uL (ref 150.0–400.0)
RBC: 3.96 Mil/uL (ref 3.87–5.11)
RDW: 13.7 % (ref 11.5–15.5)
WBC: 7.8 10*3/uL (ref 4.0–10.5)

## 2016-07-28 LAB — VITAMIN D 25 HYDROXY (VIT D DEFICIENCY, FRACTURES): VITD: 51.4 ng/mL (ref 30.00–100.00)

## 2016-07-28 LAB — COMPREHENSIVE METABOLIC PANEL
ALBUMIN: 3.7 g/dL (ref 3.5–5.2)
ALK PHOS: 95 U/L (ref 39–117)
ALT: 17 U/L (ref 0–35)
AST: 20 U/L (ref 0–37)
BUN: 30 mg/dL — ABNORMAL HIGH (ref 6–23)
CALCIUM: 11.2 mg/dL — AB (ref 8.4–10.5)
CHLORIDE: 104 meq/L (ref 96–112)
CO2: 32 mEq/L (ref 19–32)
Creatinine, Ser: 1.23 mg/dL — ABNORMAL HIGH (ref 0.40–1.20)
GFR: 44.95 mL/min — ABNORMAL LOW (ref >60.00)
Glucose, Bld: 99 mg/dL (ref 70–99)
POTASSIUM: 4.5 meq/L (ref 3.5–5.1)
Sodium: 141 mEq/L (ref 135–145)
TOTAL PROTEIN: 7.7 g/dL (ref 6.0–8.3)
Total Bilirubin: 0.3 mg/dL (ref 0.2–1.2)

## 2016-07-28 LAB — LIPID PANEL
CHOLESTEROL: 155 mg/dL (ref 0–200)
HDL: 61.7 mg/dL (ref >39.00)
LDL CALC: 66 mg/dL (ref 0–99)
NonHDL: 92.89
TRIGLYCERIDES: 136 mg/dL (ref 0.0–149.0)
Total CHOL/HDL Ratio: 3
VLDL: 27.2 mg/dL (ref 0.0–40.0)

## 2016-07-28 LAB — TSH: TSH: 1.87 u[IU]/mL (ref 0.35–4.50)

## 2016-07-28 MED ORDER — SIMVASTATIN 40 MG PO TABS: 40.0000 mg | ORAL_TABLET | Freq: Every day | ORAL | 3 refills | Status: DC

## 2016-07-28 NOTE — Assessment & Plan Note (Signed)
She is using Excedrin and/or Fioricet several times a week Discussed with her the possibility of rebound headaches from the medication she is taking Advised her to decrease the amount of Excedrin and Fioricet she is taking May benefit from prophylactic headache medication We'll refer to neurology for their opinion

## 2016-07-28 NOTE — Assessment & Plan Note (Signed)
Check lipids. Continue statin.

## 2016-07-28 NOTE — Progress Notes (Signed)
Subjective:   Joy Martin is a 75 y.o. female who presents for Medicare Annual (Subsequent) preventive examination.   Cardiac Risk Factors include: advanced age (>68men, >51 women);dyslipidemia;obesity (BMI >30kg/m2) HRA assessment completed during this visit with Joy Martin The Patient was informed that the wellness visit is to identify future health risk and educate and initiate measures that can reduce risk for increased disease through the lifespan.    NO ROS; Medicare Wellness Visit Describes health as good, fair or great?  Fair plus Has a cough; has acid reflux as well;  Still has ongoing cough at times; may have allergies;  Father had hayfever   Risk Associated with PMH Osteopenia stopped Alendronate per MD;  Dexa Repeated in July; do not see report  Vit D; will discuss dose with Vit D  Taking Vit D in Calcium tid; 2400 Vit D 1000 Vit D in Multi vitamin 400+ Outdoors in am walking dog; approx 60 min  Psychosocial (Mother had cancer; Father had prostate cancer;  Lives alone;  Has a dog; Joy Martin    Primary Risk:  Psychosocial; lives  Alone One level home  Separate shower;  Living situation;  1. for risk such as safe community; yes 2.  smoke detector; yes 3.  firearms safety if applicable / no guns 4. protection when in the sun; sunscreen  5. driving safety for seniors or any recent accidents. No  Tobacco: former smoker with 15 pack years; quit 1983  ETOH: glass of wine;    Diet: going to weight watchers Not eating as much; watching portions;  Exercise; walking her dog every day  Stopped around Christmas  May consider silver sneaker    Dental; has dental work regularly   [place] for secondary risk Mammogram: 11/2014 - had one in Jan of this year at [facility]; was neg  Dexa 11/2015 Dr. Quay Burow has discussed osteopenia; do not see report result    Colonoscopy 12/2008; aged out most likely; no issues  EKG 07/2012  Vaccination; all are up to  date currently  Flu shot taken [facility]   Medications reviewed for issues;   Does not take ASA 81mg  as she is taking Excedrin;  Ok by dr. [name]; Does not take paxil Does not take alendronate anymore;    Fall hx; no falls;  Right knee bothers her at times; not as much now  Steps with left; and feels safer Fear of falling/ not now  Given education on "Fall Prevention in the Home" for more safety tips the patient can apply as appropriate.   Depression; anxiety or mood issues assessed  Do you have little interest or pleasure in doing things? No  Have you been feeling down, depressed, hopeless? No  PHQ9 waived or completed   Cognitive screen completed; MMSE documented or assessed for failures or issues with the AD8 screen below:   Ad8 score reviewed for issues;   Issues making decisions; no  Less interest in hobbies / activities" no  Repeats questions, stories; family complaining: NO  Trouble using ordinary gadgets; microwave; computer: no  Forgets the month or year: no  Mismanaging finances: no issues; manages her own finances   Missing apt: no but does write them down  Daily problems with thinking of memory NO Ad8 score is 0  MMSE not appropriate unless AD8 score is > 2   Advanced Directive reviewed for completion or educated regarding [name] form; the electing a health care agent and completing the Living Will.  Established and updated Risk reviewed and appropriate referral made or health recommendations as appropriate based on individual needs and choices;   Current Care Team reviewed and updated          Objective:     Vitals: BP (!) 142/82   Pulse 63   Temp 97.7 F (36.5 C)   Ht 5\' 1"  (1.549 m)   Wt 171 lb 8 oz (77.8 kg)   SpO2 97%   BMI 32.40 kg/m   Body mass index is 32.4 kg/m.   Tobacco History  Smoking Status  . Former Smoker  . Packs/day: 0.75  . Years: 20.00  . Types: Cigarettes  . Quit date: 07/25/1982  Smokeless Tobacco  .  Never Used    Comment: 07/10/2012 "stopped smoking cigarettes 1980's"     Counseling given: Yes   Past Medical History:  Diagnosis Date  . ALLERGIC RHINITIS, CHRONIC   . ANEMIA   . Arthritis    "knees mainly; thumbs"  . 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 Chronic sinusitis    follows with ENT for same  . COPD (chronic obstructive pulmonary disease) ([place]) dx 2013   GOLD 1, follows with pulm for same  . Episodic tension type headache   . EXOGENOUS OBESITY   . GERD   . HYPERLIPIDEMIA   . Hypertension   . Migraines 07/10/2012   "over; last one was @ age 68"  . OSTEOPENIA    Past Surgical History:  Procedure Laterality Date  . APPENDECTOMY  07/10/2012  . BREAST CYST ASPIRATION  ?1980's   right  . EYE SURGERY     both eyes  . KNEE ARTHROSCOPY  1970's or 1980's   right; torn cartilage  . LAPAROSCOPIC APPENDECTOMY  07/10/2012   Procedure: APPENDECTOMY LAPAROSCOPIC;  Surgeon: Harl Bowie, MD;  Location: [place];  Service: General;  Laterality: N/A;  . [place]   "my back"  . SKIN CANCER EXCISION     "2 on my back; 2 on my face"  . TONSILLECTOMY AND ADENOIDECTOMY  1946  . WISDOM TOOTH EXTRACTION  1959   Family History  Problem Relation Age of Onset  . Prostate cancer Father   . Cancer Father     prostate  . Cancer Mother     waldenstruns microgobular anemia  . Alcohol abuse Other   . Heart disease Other   . Hypertension Other   . Diabetes Other   . Pancreatic cancer Other    History  Sexual Activity  . Sexual activity: No    Outpatient Encounter Prescriptions as of 07/28/2016  Medication Sig  . butalbital-acetaminophen-caffeine (FIORICET WITH CODEINE) 50-325-40-30 MG capsule TAKE ONE CAPSULE EVERY 4 HOURS AS NEEDED FOR HEADACHE/MIGRAINE DO NOT EXCEED 4 CAPSULES IN 24 HOURS  . Calcium Carbonate (CALCIUM 500 PO) Take by mouth.    . cholecalciferol (VITAMIN D) 1000 UNITS tablet Take 1,000 Units by  mouth daily.  . ciclopirox (LOPROX) 0.77 % cream Apply 1 application topically 2 (two) times daily as needed. rash  . cycloSPORINE (RESTASIS) 0.05 % ophthalmic emulsion 1 drop 2 (two) times daily.    Marland Kitchen esomeprazole (NEXIUM) 40 MG capsule TAKE ONE CAPSULE BY MOUTH TWICE A DAY BEFORE A MEAL (Patient taking differently: TAKE ONE CAPSULE BY MOUTH A DAY BEFORE A MEAL)  . Flaxseed, Linseed, (FLAXSEED OIL PO) Take 2 tablets by mouth daily.  . fluticasone (FLONASE) 50 MCG/ACT nasal spray  PLACE 1 SPRAY INTO BOTH NOSTRILS DAILY.  . Multiple Vitamins-Minerals (CENTRUM SILVER PO) Take 1 tablet by mouth daily.  . nabumetone (RELAFEN) 750 MG tablet Take 1 tablet (750 mg total) by mouth 2 (two) times daily as needed. Yearly physical due in July must see MD for 90 day supply  . nadolol (CORGARD) 40 MG tablet Take 1 tablet (40 mg total) by mouth 2 (two) times daily.  . simvastatin (ZOCOR) 40 MG tablet Take 1 tablet (40 mg total) by mouth at bedtime.  . valsartan-hydrochlorothiazide (DIOVAN-HCT) 320-12.5 MG tablet TAKE 1 TABLET BY MOUTH DAILY.  Marland Kitchen alendronate (FOSAMAX) 70 MG tablet TAKE 1 TABLET (70 MG TOTAL) BY MOUTH ONCE A WEEK. (Patient not taking: Reported on 07/28/2016)  . aspirin 81 MG tablet Take 81 mg by mouth daily.    Marland Kitchen PARoxetine (PAXIL) 10 MG tablet TAKE 1 TABLET BY MOUTH EVERY DAY (Patient not taking: Reported on 07/28/2016)  . UNABLE TO FIND 1 tablet daily. Med Name: Carole Binning   No facility-administered encounter medications on file as of 07/28/2016.     Activities of Daily Living In your present state of health, do you have any difficulty performing the following activities: 07/28/2016  Hearing? N  Vision? N  Difficulty concentrating or making decisions? N  Walking or climbing stairs? Y  Dressing or bathing? N  Doing errands, shopping? N  Preparing Food and eating ? N  Using the Toilet? N  In the past six months, have you accidently leaked urine? N  Do you have problems with loss of bowel  control? N  Managing your Medications? N  Managing your Finances? N  Housekeeping or managing your Housekeeping? N  Some recent data might be hidden    Patient Care Team: [name], MD as PCP - General (Internal Medicine) Tanda Rockers, MD (Pulmonary Disease) Melissa Montane, MD (Otolaryngology)    Assessment:   Exercise Activities and Dietary recommendations Current Exercise Habits: Home [facility] routine ([facility]; )  Goals    . Weight (lb) < 155 lb (70.3 kg)          In weight watchers currently       Fall Risk Fall Risk  07/28/2016 05/13/2015 11/06/2013  Falls in the past year? No Yes No  Number falls in past yr: - 2 or more -  Injury with Fall? 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 Influenza Split 08/13/2011, 07/18/2012, 07/22/2014  . Influenza Whole 08/23/2007, 08/21/2008, 08/05/2009, 07/29/2010  . Influenza, High Dose Seasonal PF 07/26/2013, 07/22/2014  . Influenza-Unspecified 07/19/2016  . Pneumococcal Conjugate-13 11/06/2013  . Pneumococcal Polysaccharide-23 12/19/2007  . Td 11/08/1996, 12/19/2007  . Zoster 03/24/2006   Screening Tests Health Maintenance  Topic Date Due  . TETANUS/TDAP  12/18/2017  . INFLUENZA VACCINE  Completed  . DEXA SCAN  Completed  . ZOSTAVAX  Completed  . PNA vac Low Risk Adult  Completed      Plan:   Continue to lose weight  Will  [facility] or other Educated on the need for strength building exercise  Will consider lifeline for safety;   Will discuss appropriate does of Vit d  with md    During the course of the visit the patient was educated and counseled about the following appropriate screening and preventive services:   Vaccines to include Pneumoccal, Influenza, Hepatitis B, Td, Zostavax, HCV  Electrocardiogram  Cardiovascular Disease  Colorectal cancer screening  Bone density screening/ stopped fosamax   Diabetes screening  Glaucoma screening  Mammography/PAP  Nutrition counseling   Patient Instructions (the written plan) was given to the patient.   Wynetta Fines, RN  07/28/2016

## 2016-07-28 NOTE — Assessment & Plan Note (Signed)
Has intermittent pain Takes nabumetone as needed

## 2016-07-28 NOTE — Patient Instructions (Addendum)
    Joy Martin , Thank you for taking time to come for your Medicare Wellness Visit. I appreciate your ongoing commitment to your health goals. Please review the following plan we discussed and let me know if I can assist you in the future.   Keep up the good work losing weight  [facility] about [facility]; silver sneaker program  Calcium 1200mg  with Vit D 1000 to 2000u  per day; more as directed by physician Strength building exercises discussed; can include walking; housework; small weights or stretch bands; silver sneakers if access to the [employer]: http://www.lifelinesys.com/content/home; 610-888-2704 [place]; 973-738-2208 [address] [place]; (954)123-6009 Get resource to get information on any and all community programs for Seniors     These are the goals we discussed: Goals    . Weight (lb) < 155 lb (70.3 kg)          In weight watchers currently        This is a list of the screening recommended for you and due dates:  Health Maintenance  Topic Date Due  . Tetanus Vaccine  12/18/2017  . Flu Shot  Completed  . DEXA scan (bone density measurement)  Completed  . Shingles Vaccine  Completed  . Pneumonia vaccines  Completed    Test(s) ordered today. Your results will be released to [place] (or called to you) after review, usually within 72hours after test completion. If any changes need to be made, you will be notified at that same time.  All other Health Maintenance issues reviewed.   All recommended immunizations and age-appropriate screenings are up-to-date or discussed.  No immunizations administered today.   Medications reviewed and updated.  Changes include changing the nexium to every other day.   A referral  Was ordered for neurology.   Please followup in 6 months

## 2016-07-28 NOTE — Assessment & Plan Note (Signed)
DEXA done earlier this year and showed osteopenia Has been on Fosamax for 4-5 years so we have discontinued it Will monitor DEXA every 2 years

## 2016-07-28 NOTE — Progress Notes (Signed)
Subjective:    Patient ID: Joy Martin, female    DOB: Sep 11, 1941, 75 y.o.   MRN: [id number]  HPI She is here for a physical exam.   She is taking about 3900 - ? Units of vitamin d daily.  Cough:  For the past two weeks she has had a cough.  Her nose has been running more.  She thinks she has mild allergies.  She does not feel like it is a cold.  Her symptoms have been improving and she just wanted to mention it.  She has had some bleeding on her nose.  It is in an area where she had Moh's surgery last march. She wonders it if is a stitch.  The dermatologist told her she had dissolvable stitches and she thought she saw a stitch in that area. She wanted to have me look at it. She does see her dermatologist in a few months for routine follow-up.  Back pain;  She has had some recent left sided back pain, which has resolved.  It felt like a tightness that would come and go.  It was in the middle back and felt like a spasm.      Headaches:  She takes Excedrin for headaches.  That usually works.  If it does not she will take Fioricet.  The headaches occur throughout her head-no current different areas.  She has been to the headache center years ago.  Several times a week she will take Excedrin and/or Fioricet. She has been on these medications for years. I have expressed concern to her in the past about how much of these medications she is taking.  She walks a dog in the morning - about one mile.   Medications and allergies reviewed with patient and updated if appropriate.  Patient Active Problem List   Diagnosis Date Noted  . Basal cell carcinoma of skin 11/26/2015  . Abnormal CXR 01/27/2013  . Postmenopausal symptoms   . Obesity 12/25/2009  . EPISODIC TENSION TYPE HEADACHE 12/24/2008  . ESSENTIAL HYPERTENSION, BENIGN 12/24/2008  . ALLERGIC RHINITIS, CHRONIC 12/24/2008  . Hyperlipidemia 12/19/2007  . ANEMIA 12/19/2007  . GERD 12/19/2007  . ARTHRITIS 12/19/2007  . Osteopenia  12/19/2007    Current Outpatient Prescriptions on File Prior to Visit  Medication Sig Dispense Refill  . butalbital-acetaminophen-caffeine (FIORICET WITH CODEINE) 50-325-40-30 MG capsule TAKE ONE CAPSULE EVERY 4 HOURS AS NEEDED FOR HEADACHE/MIGRAINE DO NOT EXCEED 4 CAPSULES IN 24 HOURS 30 capsule 0  . Calcium Carbonate (CALCIUM 500 PO) Take by mouth.      . cholecalciferol (VITAMIN D) 1000 UNITS tablet Take 1,000 Units by mouth daily.    . ciclopirox (LOPROX) 0.77 % cream Apply 1 application topically 2 (two) times daily as needed. rash    . cycloSPORINE (RESTASIS) 0.05 % ophthalmic emulsion 1 drop 2 (two) times daily.      Marland Kitchen esomeprazole (NEXIUM) 40 MG capsule TAKE ONE CAPSULE BY MOUTH TWICE A DAY BEFORE A MEAL (Patient taking differently: TAKE ONE CAPSULE BY MOUTH A DAY BEFORE A MEAL) 180 capsule 2  . Flaxseed, Linseed, (FLAXSEED OIL PO) Take 2 tablets by mouth daily.    . fluticasone (FLONASE) 50 MCG/ACT nasal spray PLACE 1 SPRAY INTO BOTH NOSTRILS DAILY. 16 g 3  . Multiple Vitamins-Minerals (CENTRUM SILVER PO) Take 1 tablet by mouth daily.    . nabumetone (RELAFEN) 750 MG tablet Take 1 tablet (750 mg total) by mouth 2 (two) times daily as needed. Yearly physical  due in July must see MD for 90 day supply 60 tablet 0  . nadolol (CORGARD) 40 MG tablet Take 1 tablet (40 mg total) by mouth 2 (two) times daily. 180 tablet 2  . simvastatin (ZOCOR) 40 MG tablet Take 1 tablet (40 mg total) by mouth at bedtime. 90 tablet 3  . valsartan-hydrochlorothiazide (DIOVAN-HCT) 320-12.5 MG tablet TAKE 1 TABLET BY MOUTH DAILY. 90 tablet 1  . alendronate (FOSAMAX) 70 MG tablet TAKE 1 TABLET (70 MG TOTAL) BY MOUTH ONCE A WEEK. (Patient not taking: Reported on 07/28/2016) 12 tablet 3  . aspirin 81 MG tablet Take 81 mg by mouth daily.      Marland Kitchen PARoxetine (PAXIL) 10 MG tablet TAKE 1 TABLET BY MOUTH EVERY DAY (Patient not taking: Reported on 07/28/2016) 90 tablet 1  . UNABLE TO FIND 1 tablet daily. Med Name: Carole Binning       No current facility-administered medications on file prior to visit.     Past Medical History:  Diagnosis Date  . ALLERGIC RHINITIS, CHRONIC   . ANEMIA   . Arthritis    "knees mainly; thumbs"  . 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 Chronic sinusitis    follows with ENT for same  . COPD (chronic obstructive pulmonary disease) ([place]) dx 2013   GOLD 1, follows with pulm for same  . Episodic tension type headache   . EXOGENOUS OBESITY   . GERD   . HYPERLIPIDEMIA   . Hypertension   . Migraines 07/10/2012   "over; last one was @ age 89"  . OSTEOPENIA     Past Surgical History:  Procedure Laterality Date  . APPENDECTOMY  07/10/2012  . BREAST CYST ASPIRATION  ?1980's   right  . EYE SURGERY     both eyes  . KNEE ARTHROSCOPY  1970's or 1980's   right; torn cartilage  . LAPAROSCOPIC APPENDECTOMY  07/10/2012   Procedure: APPENDECTOMY LAPAROSCOPIC;  Surgeon: Harl Bowie, MD;  Location: [facility];  Service: General;  Laterality: N/A;  . [place]   "my back"  . SKIN CANCER EXCISION     "2 on my back; 2 on my face"  . TONSILLECTOMY AND ADENOIDECTOMY  1946  . [place] EXTRACTION  1959    Social History   Social History  . Marital status: Single    Spouse name: N/A  . Number of children: N/A  . Years of education: N/A   Social History Main Topics  . Smoking status: Former Smoker    Packs/day: 0.75    Years: 20.00    Types: Cigarettes    Quit date: 07/25/1982  . Smokeless tobacco: Never Used     Comment: 07/10/2012 "stopped smoking cigarettes 1980's"  . Alcohol use Yes     Comment: 07/10/2012 "glass of wine 1-2X/yr"  . Drug use: No  . Sexual activity: No   Other Topics Concern  . Not on file   Social History Narrative   Retired Pharmacist, hospital    Family History  Problem Relation Age of Onset  . Prostate cancer Father   . Cancer Father     prostate  . Cancer Mother     waldenstruns microgobular anemia   . Alcohol abuse Other   . Heart disease Other   . Hypertension Other   . Diabetes Other   . Pancreatic cancer Other     Review of Systems  Constitutional: Negative for chills and  fever.  HENT: Positive for congestion (mild in morning) and postnasal drip. Negative for sinus pressure.   Eyes: Negative for visual disturbance.  Respiratory: Positive for cough and shortness of breath (chronic, with activity, no change). Negative for wheezing.   Cardiovascular: Negative for chest pain, palpitations and leg swelling.  Gastrointestinal: Negative for abdominal pain, constipation, diarrhea and nausea.  Genitourinary: Negative for dysuria and hematuria.  Musculoskeletal: Positive for arthralgias.  Skin:       Bleeding nose - intermittent  Neurological: Positive for headaches. Negative for dizziness and light-headedness.  Psychiatric/Behavioral: Negative for dysphoric mood. The patient is not nervous/anxious.        Objective:   Vitals:   07/28/16 1330  BP: (!) 142/82  Pulse: 63  Temp: 97.7 F (36.5 C)   Filed Weights   07/28/16 1330  Weight: 171 lb 8 oz (77.8 kg)   Body mass index is 32.4 kg/m.   Physical Exam Constitutional: She appears well-developed and well-nourished. No distress.  HENT:  Head: Normocephalic and atraumatic.  Right Ear: External ear normal. Normal ear canal and TM Left Ear: External ear normal.  Normal ear canal and TM Mouth/Throat: Oropharynx is clear and moist.  Eyes: Conjunctivae and EOM are normal.  Neck: Neck supple. No tracheal deviation present. No thyromegaly present.  No carotid bruit  Cardiovascular: Normal rate, regular rhythm and normal heart sounds.   No murmur heard.  No edema. Pulmonary/Chest: Effort normal and breath sounds normal. No respiratory distress. She has no wheezes. She has no rales.  Breast: deferred Abdominal: Soft. She exhibits no distension. There is no tenderness.  Musculoskeletal: Scoliosis of spine  Lymphadenopathy:  She has no cervical adenopathy.  Skin: Skin is warm and dry. She is not diaphoretic.  scar left side of nose from prior Mohs surgery. No obvious stitch or bleeding.  Psychiatric: She has a normal mood and affect. Her behavior is normal.         Assessment & Plan:   Physical exam: Screening blood work  ordered Immunizations  Up-to-date Colonoscopy no longer needed after each   Mammogram-no longer needed  Gyn-routine Pap smears no longer needed  Dexa-up-to-date  Eye exams  Up to date  Exercise - walks about 1 mile a day-increase of possible  Weight  overweight-encouraged weight loss  Skin -sees dermatology regularly. If her nose continues to bleed or she has any other concerns regarding that. She will follow-up with dermatology soon  Substance abuse  none  See Problem List for Assessment and Plan of chronic medical problems.  Follow-up in 6 months, sooner if needed

## 2016-07-28 NOTE — Assessment & Plan Note (Signed)
Still has hot flashes Was put on Paxil 10 mg daily, but no improvement so she self DC'd the medication

## 2016-07-28 NOTE — Assessment & Plan Note (Signed)
Exercising regularly Encouraged weight loss-decreased portions

## 2016-07-28 NOTE — Assessment & Plan Note (Signed)
BP well controlled Current regimen effective and well tolerated Continue current medications at current doses  BP Readings from Last 3 Encounters:  07/28/16 (!) 142/82  11/26/15 128/76  05/13/15 138/84

## 2016-07-28 NOTE — Assessment & Plan Note (Signed)
Continue to see derm annually, sooner if concerning changes

## 2016-07-28 NOTE — Assessment & Plan Note (Addendum)
GERD controlled Discussed concerns with long term use of nexium  Will try decreasing Nexium to every other day Encouraged weight loss Avoidance of NSAIDs

## 2016-07-29 ENCOUNTER — Encounter: Payer: Self-pay | Admitting: Internal Medicine

## 2016-08-05 ENCOUNTER — Encounter: Payer: Self-pay | Admitting: Internal Medicine

## 2016-08-27 ENCOUNTER — Encounter: Payer: Self-pay | Admitting: Diagnostic Neuroimaging

## 2016-08-27 ENCOUNTER — Ambulatory Visit (INDEPENDENT_AMBULATORY_CARE_PROVIDER_SITE_OTHER): Payer: Medicare Other | Admitting: Diagnostic Neuroimaging

## 2016-08-27 ENCOUNTER — Other Ambulatory Visit: Payer: Self-pay | Admitting: Internal Medicine

## 2016-08-27 VITALS — BP 103/59 | HR 60 | Ht 61.0 in | Wt 170.0 lb

## 2016-08-27 DIAGNOSIS — T3995XA Adverse effect of unspecified nonopioid analgesic, antipyretic and antirheumatic, initial encounter: Secondary | ICD-10-CM

## 2016-08-27 DIAGNOSIS — G43109 Migraine with aura, not intractable, without status migrainosus: Secondary | ICD-10-CM

## 2016-08-27 DIAGNOSIS — G4440 Drug-induced headache, not elsewhere classified, not intractable: Secondary | ICD-10-CM | POA: Diagnosis not present

## 2016-08-27 MED ORDER — TOPIRAMATE 25 MG PO TABS: 25.0000 mg | ORAL_TABLET | Freq: Every day | ORAL | 6 refills | Status: DC

## 2016-08-27 NOTE — Progress Notes (Signed)
[facility]  PATIENT: Joy Martin DOB: May 29, 1941  REFERRING CLINICIAN: S Burns HISTORY FROM: patient  REASON FOR VISIT: new consult   HISTORICAL  CHIEF COMPLAINT:  Chief Complaint  Patient presents with  . Headache    rm 7, New Pt, "hx migraines, stopped after menopause; no more migraines but have headaches almost daily, Excedrin usually works"    HISTORY OF PRESENT ILLNESS:   75 year old right-handed female here for evaluation of headaches.  Around age 88 years old patient had onset of right-sided headaches with right eye pain, throbbing, nausea, proceeded by visual disturbance and aura. Patient was diagnosed with migraine headaches. During factors include menstrual cycle and red wine. She averaged 2-3 headaches per month each lasting 1-2 days. Patient was evaluated by the headache clinic in the past and tried nadolol, Imitrex, over-the-counter medications. By age 56 years old patient was going through menopause and her migraine headaches subsided.  Over many years patient has also had lower grade dull sore and intermittent stabbing headaches sometimes on the right side, sometimes on the top of her head. No other associated factors. These been going on for at least 10-20 years. Patient has gradually built up to using 2-6 tablets of Excedrin over-the-counter on a daily basis. This is been going on for many many years. Patient has tried weaning off in the past but this caused rebound headaches. Patient also was taking Fioricet 20 tablets per month for many years. Patient now establish with a new PCP who is reviewing medication list and advising patient to reduce medication overusage which may be triggering analgesic overuse headache and rebound headache. Patient referred to me for further evaluation consideration of prophylactic headache therapy.    REVIEW OF SYSTEMS: Full 14 system review of systems performed and negative with exception of: Headache easy  bruising feeling hot flushing joint pain decreased energy ringing in ears birthmarks shortness of breath cough.  ALLERGIES: Allergies  Allergen Reactions  . Allegra [Fexofenadine Hcl] Other (See Comments)    Headache, "took the pill; I got flu symptoms"    HOME MEDICATIONS: Outpatient Medications Prior to Visit  Medication Sig Dispense Refill  . Calcium Carbonate (CALCIUM 500 PO) Take by mouth.      . cholecalciferol (VITAMIN D) 1000 UNITS tablet Take 1,000 Units by mouth daily.    . ciclopirox (LOPROX) 0.77 % cream Apply 1 application topically 2 (two) times daily as needed. rash    . cycloSPORINE (RESTASIS) 0.05 % ophthalmic emulsion 1 drop 2 (two) times daily.      Marland Kitchen esomeprazole (NEXIUM) 40 MG capsule TAKE ONE CAPSULE BY MOUTH TWICE A DAY BEFORE A MEAL (Patient taking differently: TAKE ONE CAPSULE BY MOUTH A DAY BEFORE A MEAL) 180 capsule 2  . Flaxseed, Linseed, (FLAXSEED OIL PO) Take 2 tablets by mouth daily.    . fluticasone (FLONASE) 50 MCG/ACT nasal spray PLACE 1 SPRAY INTO BOTH NOSTRILS DAILY. 16 g 3  . Multiple Vitamins-Minerals (CENTRUM SILVER PO) Take 1 tablet by mouth daily.    . nabumetone (RELAFEN) 750 MG tablet Take 1 tablet (750 mg total) by mouth 2 (two) times daily as needed. Yearly physical due in July must see MD for 90 day supply 60 tablet 0  . nadolol (CORGARD) 40 MG tablet Take 1 tablet (40 mg total) by mouth 2 (two) times daily. 180 tablet 2  . simvastatin (ZOCOR) 40 MG tablet Take 1 tablet (40 mg total) by mouth at bedtime. 90 tablet 3  . valsartan-hydrochlorothiazide (  DIOVAN-HCT) 320-12.5 MG tablet TAKE 1 TABLET BY MOUTH DAILY. 90 tablet 1  . butalbital-acetaminophen-caffeine (FIORICET WITH CODEINE) 50-325-40-30 MG capsule TAKE ONE CAPSULE EVERY 4 HOURS AS NEEDED FOR HEADACHE/MIGRAINE DO NOT EXCEED 4 CAPSULES IN 24 HOURS 30 capsule 0  . UNABLE TO FIND 1 tablet daily. Med Name: Carole Binning     No facility-administered medications prior to visit.     PAST MEDICAL  HISTORY: Past Medical History:  Diagnosis Date  . ALLERGIC RHINITIS, CHRONIC   . ANEMIA   . Arthritis    "knees mainly; thumbs"  . 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 Chronic sinusitis    follows with ENT for same  . COPD (chronic obstructive pulmonary disease) ([place]) dx 2013   GOLD 1, follows with pulm for same  . Episodic tension type headache   . EXOGENOUS OBESITY   . GERD   . HYPERLIPIDEMIA   . Hypertension   . Migraines 07/10/2012   "over; last one was @ age 18"  . OSTEOPENIA     PAST SURGICAL HISTORY: Past Surgical History:  Procedure Laterality Date  . APPENDECTOMY  07/10/2012  . BREAST CYST ASPIRATION  ?1980's   right  . EYE SURGERY     both eyes  . KNEE ARTHROSCOPY  1970's or 1980's   right; torn cartilage  . LAPAROSCOPIC APPENDECTOMY  07/10/2012   Procedure: APPENDECTOMY LAPAROSCOPIC;  Surgeon: Harl Bowie, MD;  Location: [facility];  Service: General;  Laterality: N/A;  . MOHS SURGERY  01/2016   nose  . MOLE REMOVAL  1957   "my back"  . SKIN CANCER EXCISION     "2 on my back; 2 on my face"  . TONSILLECTOMY AND ADENOIDECTOMY  1946  . WISDOM TOOTH EXTRACTION  1959    FAMILY HISTORY: Family History  Problem Relation Age of Onset  . Prostate cancer Father   . Cancer Father     prostate  . Cancer Mother     waldenstruns microgobular anemia  . Alcohol abuse Other   . Heart disease Other   . Hypertension Other   . Diabetes Other   . Pancreatic cancer Other   . Cancer Maternal Grandfather     pancreatic    SOCIAL HISTORY:  Social History   Social History  . Marital status: Single    Spouse name: N/A  . Number of children: 0  . Years of education: 28   Occupational History  .      retired Pharmacist, hospital, 5th grade   Social History Main Topics  . Smoking status: Former Smoker    Packs/day: 0.75    Years: 20.00    Types: Cigarettes    Quit date: 07/25/1982  . Smokeless tobacco: Never Used       Comment: 07/10/2012 "stopped smoking cigarettes 1980's"  . Alcohol use Yes     Comment: 07/10/2012 "glass of wine 1-2X/yr"  . Drug use: No  . Sexual activity: No   Other Topics Concern  . 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  Visual Acuity Screening  Right eye Left eye Both eyes  Without correction: 20/50 20/50   With correction:        Patient is in no distress; well developed, nourished and groomed; neck is supple  CARDIOVASCULAR:  Examination of carotid arteries is normal; no carotid bruits  Regular rate and rhythm, no murmurs  Examination of peripheral vascular system by observation and palpation is normal  EYES:  Ophthalmoscopic exam of optic discs and posterior segments is normal; no papilledema or hemorrhages  MUSCULOSKELETAL:  Gait, strength, tone, movements noted in Neurologic exam below  NEUROLOGIC: MENTAL STATUS:  MMSE - Mini Mental State Exam 07/28/2016  Not completed: (No Data)    awake, alert, oriented to person, place and time  recent and remote memory intact  normal attention and concentration  language fluent, comprehension intact, naming intact,   fund of knowledge appropriate  CRANIAL NERVE:   2nd - no papilledema on fundoscopic exam  2nd, 3rd, 4th, 6th - pupils equal and reactive to light, visual fields full to confrontation, extraocular muscles intact, no nystagmus  5th - facial sensation symmetric  7th - facial strength symmetric  8th - hearing intact  9th - palate elevates symmetrically, uvula midline  11th - shoulder shrug symmetric  12th - tongue protrusion midline  MOTOR:   normal bulk and tone, full strength in the BUE,  BLE  SENSORY:   normal and symmetric to light touch, pinprick, temperature, vibration  COORDINATION:   finger-nose-finger, fine finger movements normal  REFLEXES:   deep tendon reflexes present and symmetric  GAIT/STATION:   narrow based gait    DIAGNOSTIC DATA (LABS, IMAGING, TESTING) - I reviewed patient records, labs, notes, testing and imaging myself where available.  Lab Results  Component Value Date   WBC 7.8 07/28/2016   HGB 12.1 07/28/2016   HCT 36.0 07/28/2016   MCV 90.9 07/28/2016   PLT 355.0 07/28/2016      Component Value Date/Time   NA 141 07/28/2016 1505   K 4.5 07/28/2016 1505   CL 104 07/28/2016 1505   CO2 32 07/28/2016 1505   GLUCOSE 99 07/28/2016 1505   BUN 30 (H) 07/28/2016 1505   CREATININE 1.23 (H) 07/28/2016 1505   CALCIUM 11.2 (H) 07/28/2016 1505   PROT 7.7 07/28/2016 1505   ALBUMIN 3.7 07/28/2016 1505   Martin 20 07/28/2016 1505   ALT 17 07/28/2016 1505   ALKPHOS 95 07/28/2016 1505   BILITOT 0.3 07/28/2016 1505   GFRNONAA 53 (L) 07/10/2012 1120   GFRAA 61 (L) 07/10/2012 1120   Lab Results  Component Value Date   CHOL 155 07/28/2016   HDL 61.70 07/28/2016   LDLCALC 66 07/28/2016   LDLDIRECT 143.2 12/19/2007   TRIG 136.0 07/28/2016   CHOLHDL 3 07/28/2016   Lab Results  Component Value Date   HGBA1C 6.5 10/25/2012   No results found for: VITAMINB12 Lab Results  Component Value Date   TSH 1.87 07/28/2016    02/14/13 CT sinuses - Pansinusitis. Findings likely represent a combination of acute and chronic sinus disease.     ASSESSMENT AND PLAN  75 y.o. year old female here with History of migraine headaches since age 24 years old, with at least 10 years of low-grade chronic daily headache, tension headache, analgesic overuse headache.   Dx:  1. Migraine with aura and without status migrainosus, not intractable   2. Analgesic overuse headache   3. Analgesic rebound headache      PLAN: - start low dose topiramate 25mg   at bedtime (drink plenty of water; monitor for side effects) - gradually reduce excedrin OTC over next 4-6 weeks; goal would be to use no more than 5-10 tabs per month  Meds ordered this encounter  Medications  . topiramate (TOPAMAX) 25 MG tablet    Sig: Take 1 tablet (25 mg total) by mouth at bedtime.    Dispense:  30 tablet    Refill:  6   Return in about 3 months (around 11/27/2016).    Penni Bombard, MD [id number], [id number] AM Certified in Neurology, Neurophysiology and Neuroimaging  [facility] [address], [place], [place] [address] [phone]

## 2016-08-27 NOTE — Patient Instructions (Signed)
-   start low dose topiramate 25mg  at bedtime (drink plenty of water; monitor for side effects)  - gradually reduce excedrin OTC over next 4-6 weeks; goal would be to use no more than 5-10 tabs per month  - To prevent or relieve headaches, try the following:   Cool Compress. Lie down and place a cool compress on your head.   Avoid headache triggers. If certain foods or odors seem to have triggered your migraines in the past, avoid them. A headache diary might help you identify triggers.   Include physical activity in your daily routine.   Manage stress. Find healthy ways to cope with the stressors, such as delegating tasks on your to-do list.   Practice relaxation techniques. Try deep breathing, yoga, massage and visualization.   Eat regularly. Eating regularly scheduled meals and maintaining a healthy diet might help prevent headaches. Also, drink plenty of fluids.   Follow a regular sleep schedule. Sleep deprivation might contribute to headaches  Consider biofeedback. With this mind-body technique, you learn to control certain bodily functions - such as muscle tension, heart rate and blood pressure - to prevent headaches or reduce headache pain.

## 2016-09-07 ENCOUNTER — Encounter: Payer: Self-pay | Admitting: Internal Medicine

## 2016-09-12 MED ORDER — PROMETHAZINE-DM 6.25-15 MG/5ML PO SYRP: 5.0000 mL | ORAL_SOLUTION | Freq: Four times a day (QID) | ORAL | 0 refills | Status: DC | PRN

## 2016-09-24 ENCOUNTER — Other Ambulatory Visit: Payer: Self-pay | Admitting: Internal Medicine

## 2016-09-27 NOTE — Telephone Encounter (Signed)
Please advise, last filled on 09/12/16

## 2016-11-08 DIAGNOSIS — N189 Chronic kidney disease, unspecified: Secondary | ICD-10-CM

## 2016-11-08 HISTORY — DX: Chronic kidney disease, unspecified: N18.9

## 2016-11-25 ENCOUNTER — Other Ambulatory Visit: Payer: Self-pay | Admitting: Internal Medicine

## 2016-11-29 ENCOUNTER — Encounter: Payer: Self-pay | Admitting: Internal Medicine

## 2016-11-29 DIAGNOSIS — H35039 Hypertensive retinopathy, unspecified eye: Secondary | ICD-10-CM | POA: Insufficient documentation

## 2016-12-03 LAB — HM MAMMOGRAPHY

## 2016-12-07 ENCOUNTER — Encounter: Payer: Self-pay | Admitting: Diagnostic Neuroimaging

## 2016-12-07 ENCOUNTER — Ambulatory Visit (INDEPENDENT_AMBULATORY_CARE_PROVIDER_SITE_OTHER): Payer: Medicare Other | Admitting: Diagnostic Neuroimaging

## 2016-12-07 VITALS — BP 140/67 | HR 56 | Wt 176.0 lb

## 2016-12-07 DIAGNOSIS — G4440 Drug-induced headache, not elsewhere classified, not intractable: Secondary | ICD-10-CM | POA: Diagnosis not present

## 2016-12-07 DIAGNOSIS — G43109 Migraine with aura, not intractable, without status migrainosus: Secondary | ICD-10-CM

## 2016-12-07 DIAGNOSIS — T3995XA Adverse effect of unspecified nonopioid analgesic, antipyretic and antirheumatic, initial encounter: Secondary | ICD-10-CM

## 2016-12-07 MED ORDER — TOPIRAMATE 50 MG PO TABS: 50.0000 mg | ORAL_TABLET | Freq: Two times a day (BID) | ORAL | 12 refills | Status: DC

## 2016-12-07 NOTE — Progress Notes (Signed)
[facility]  PATIENT: Joy Martin DOB: 14-Jul-1941  REFERRING CLINICIAN: S Burns HISTORY FROM: patient  REASON FOR VISIT: follow up   HISTORICAL  CHIEF COMPLAINT:  Chief Complaint  Patient presents with  . Migraine    rm 7, " usually have HA every day, avg taking Excedrin 2-4 times daily, Topamax not really helping"  . Follow-up    3 month    HISTORY OF PRESENT ILLNESS:   UPDATE 12/07/16: Since last visit visit, continues with daily headaches. Has slightly reduced excedrin to 2-4 tabs per day. Tolerating TPX 25mg  daily.   PRIOR HPI (08/27/16): 76 year old right-handed female here for evaluation of headaches. Around age 42 years old patient had onset of right-sided headaches with right eye pain, throbbing, nausea, proceeded by visual disturbance and aura. Patient was diagnosed with migraine headaches. During factors include menstrual cycle and red wine. She averaged 2-3 headaches per month each lasting 1-2 days. Patient was evaluated by the headache clinic in the past and tried nadolol, Imitrex, over-the-counter medications. By age 76 years old patient was going through menopause and her migraine headaches subsided. Over many years patient has also had lower grade dull sore and intermittent stabbing headaches sometimes on the right side, sometimes on the top of her head. No other associated factors. These been going on for at least 10-20 years. Patient has gradually built up to using 2-6 tablets of Excedrin over-the-counter on a daily basis. This is been going on for many many years. Patient has tried weaning off in the past but this caused rebound headaches. Patient also was taking Fioricet 20 tablets per month for many years. Patient now establish with a new PCP who is reviewing medication list and advising patient to reduce medication overusage which may be triggering analgesic overuse headache and rebound headache. Patient referred to me for further evaluation  consideration of prophylactic headache therapy.   REVIEW OF SYSTEMS: Full 14 system review of systems performed and negative with exception of: Headache easy bruising feeling hot flushing joint pain decreased energy ringing in ears shortness of breath cough.  ALLERGIES: Allergies  Allergen Reactions  . Allegra [Fexofenadine Hcl] Other (See Comments)    Headache, "took the pill; I got flu symptoms"    HOME MEDICATIONS: Outpatient Medications Prior to Visit  Medication Sig Dispense Refill  . Calcium Carbonate (CALCIUM 500 PO) Take by mouth.      . cholecalciferol (VITAMIN D) 1000 UNITS tablet Take 1,000 Units by mouth daily.    . ciclopirox (LOPROX) 0.77 % cream Apply 1 application topically 2 (two) times daily as needed. rash    . cycloSPORINE (RESTASIS) 0.05 % ophthalmic emulsion 1 drop 2 (two) times daily.      Marland Kitchen esomeprazole (NEXIUM) 40 MG capsule TAKE ONE CAPSULE BY MOUTH TWICE A DAY BEFORE A MEAL 180 capsule 2  . Flaxseed, Linseed, (FLAXSEED OIL PO) Take 2 tablets by mouth daily.    . fluticasone (FLONASE) 50 MCG/ACT nasal spray PLACE 1 SPRAY INTO BOTH NOSTRILS DAILY. 16 g 3  . Multiple Vitamins-Minerals (CENTRUM SILVER PO) Take 1 tablet by mouth daily.    . nabumetone (RELAFEN) 750 MG tablet Take 1 tablet (750 mg total) by mouth 2 (two) times daily as needed. Yearly physical due in July must see MD for 90 day supply 60 tablet 0  . nadolol (CORGARD) 40 MG tablet TAKE 1 TABLET (40 MG TOTAL) BY MOUTH 2 (TWO) TIMES DAILY. 180 tablet 3  . simvastatin (ZOCOR) 40 MG  tablet Take 1 tablet (40 mg total) by mouth at bedtime. 90 tablet 3  . topiramate (TOPAMAX) 25 MG tablet Take 1 tablet (25 mg total) by mouth at bedtime. 30 tablet 6  . valsartan-hydrochlorothiazide (DIOVAN-HCT) 320-12.5 MG tablet TAKE 1 TABLET BY MOUTH DAILY. 90 tablet 1  . promethazine-dextromethorphan (PROMETHAZINE-DM) 6.25-15 MG/5ML syrup TAKE 1 TEASPOONFUL BY MOUTH 4 TIMES A DAY AS NEEDED FOR COUGH 120 mL 0   No  facility-administered medications prior to visit.     PAST MEDICAL HISTORY: Past Medical History:  Diagnosis Date  . ALLERGIC RHINITIS, CHRONIC   . ANEMIA   . Arthritis    "knees mainly; thumbs"  . 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 Chronic sinusitis    follows with ENT for same  . COPD (chronic obstructive pulmonary disease) ([place]) dx 2013   GOLD 1, follows with pulm for same  . Episodic tension type headache   . EXOGENOUS OBESITY   . GERD   . HYPERLIPIDEMIA   . Hypertension   . Migraines 07/10/2012   "over; last one was @ age 23"  . OSTEOPENIA     PAST SURGICAL HISTORY: Past Surgical History:  Procedure Laterality Date  . APPENDECTOMY  07/10/2012  . BREAST CYST ASPIRATION  ?1980's   right  . EYE SURGERY     both eyes  . KNEE ARTHROSCOPY  1970's or 1980's   right; torn cartilage  . LAPAROSCOPIC APPENDECTOMY  07/10/2012   Procedure: APPENDECTOMY LAPAROSCOPIC;  Surgeon: Harl Bowie, MD;  Location: [facility];  Service: General;  Laterality: N/A;  . MOHS SURGERY  01/2016   nose  . MOLE REMOVAL  1957   "my back"  . SKIN CANCER EXCISION     "2 on my back; 2 on my face"  . TONSILLECTOMY AND ADENOIDECTOMY  1946  . WISDOM TOOTH EXTRACTION  1959    FAMILY HISTORY: Family History  Problem Relation Age of Onset  . Prostate cancer Father   . Cancer Father     prostate  . Cancer Mother     waldenstruns microgobular anemia  . Alcohol abuse Other   . Heart disease Other   . Hypertension Other   . Diabetes Other   . Pancreatic cancer Other   . Cancer Maternal Grandfather     pancreatic    SOCIAL HISTORY:  Social History   Social History  . Marital status: Single    Spouse name: N/A  . Number of children: 0  . Years of education: 70   Occupational History  .      retired Pharmacist, hospital, 5th grade   Social History Main Topics  . Smoking status: Former Smoker    Packs/day: 0.75    Years: 20.00    Types:  Cigarettes    Quit date: 07/25/1982  . Smokeless tobacco: Never Used     Comment: 07/10/2012 "stopped smoking cigarettes 1980's"  . Alcohol use Yes     Comment: 07/10/2012 "glass of wine 1-2X/yr"  . Drug use: No  . Sexual activity: No   Other Topics Concern  . 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 No exam data present  Patient is in no distress; well developed, nourished and groomed; neck is supple  CARDIOVASCULAR:  Examination of carotid arteries is normal; no carotid bruits  Regular rate and rhythm, no murmurs  Examination of peripheral vascular system by observation and palpation is normal  EYES:  Ophthalmoscopic exam of optic discs and posterior segments is normal; no papilledema or hemorrhages  MUSCULOSKELETAL:  Gait, strength, tone, movements noted in Neurologic exam below  NEUROLOGIC: MENTAL STATUS:  MMSE - Mini Mental State Exam 07/28/2016  Not completed: (No Data)    awake, alert, oriented to person, place and time  recent and remote memory intact  normal attention and concentration  language fluent, comprehension intact, naming intact,   fund of knowledge appropriate  CRANIAL NERVE:   2nd - no papilledema on fundoscopic exam  2nd, 3rd, 4th, 6th - pupils equal and reactive to light, visual fields full to confrontation, extraocular muscles intact, no nystagmus  5th - facial sensation symmetric  7th - facial strength symmetric  8th - hearing intact  9th - palate elevates symmetrically, uvula midline  11th - shoulder shrug symmetric  12th - tongue protrusion midline  MOTOR:   normal bulk and tone, full strength in the BUE, BLE  SENSORY:   normal and symmetric to light touch, pinprick,  temperature, vibration  COORDINATION:   finger-nose-finger, fine finger movements normal  REFLEXES:   deep tendon reflexes present and symmetric  GAIT/STATION:   narrow based gait    DIAGNOSTIC DATA (LABS, IMAGING, TESTING) - I reviewed patient records, labs, notes, testing and imaging myself where available.  Lab Results  Component Value Date   WBC 7.8 07/28/2016   HGB 12.1 07/28/2016   HCT 36.0 07/28/2016   MCV 90.9 07/28/2016   PLT 355.0 07/28/2016      Component Value Date/Time   NA 141 07/28/2016 1505   K 4.5 07/28/2016 1505   CL 104 07/28/2016 1505   CO2 32 07/28/2016 1505   GLUCOSE 99 07/28/2016 1505   BUN 30 (H) 07/28/2016 1505   CREATININE 1.23 (H) 07/28/2016 1505   CALCIUM 11.2 (H) 07/28/2016 1505   PROT 7.7 07/28/2016 1505   ALBUMIN 3.7 07/28/2016 1505   AST 20 07/28/2016 1505   ALT 17 07/28/2016 1505   ALKPHOS 95 07/28/2016 1505   BILITOT 0.3 07/28/2016 1505   GFRNONAA 53 (L) 07/10/2012 1120   GFRAA 61 (L) 07/10/2012 1120   Lab Results  Component Value Date   CHOL 155 07/28/2016   HDL 61.70 07/28/2016   LDLCALC 66 07/28/2016   LDLDIRECT 143.2 12/19/2007   TRIG 136.0 07/28/2016   CHOLHDL 3 07/28/2016   Lab Results  Component Value Date   HGBA1C 6.5 10/25/2012   No results found for: VITAMINB12 Lab Results  Component Value Date   TSH 1.87 07/28/2016    02/14/13 CT sinuses - Pansinusitis. Findings likely represent a combination of acute and chronic sinus disease.     ASSESSMENT AND PLAN  76 y.o. year old female here with History of migraine headaches since age 31 years old, with at least 10 years of low-grade chronic daily headache, tension headache, analgesic overuse headache.   Dx:  1. Migraine with aura and without status migrainosus, not intractable   2. Analgesic overuse headache   3. Analgesic rebound headache      PLAN: - increase topiramate to 25mg  twice a day; then up to 50mg  twice a day (drink plenty of water;  monitor for side  effects) - continue to reduce excedrin OTC over next 4-6 weeks; goal would be to use no more than 5-10 tabs per month  Meds ordered this encounter  Medications  . topiramate (TOPAMAX) 50 MG tablet    Sig: Take 1 tablet (50 mg total) by mouth 2 (two) times daily.    Dispense:  60 tablet    Refill:  12   Return in about 4 months (around 04/06/2017).    Penni Bombard, MD [id number], [id number] PM Certified in Neurology, Neurophysiology and Neuroimaging  [facility] [address], [place], [place] [address] [phone]

## 2016-12-09 ENCOUNTER — Encounter: Payer: Self-pay | Admitting: Internal Medicine

## 2016-12-15 ENCOUNTER — Encounter: Payer: Self-pay | Admitting: Internal Medicine

## 2016-12-20 ENCOUNTER — Other Ambulatory Visit: Payer: Self-pay | Admitting: Internal Medicine

## 2017-01-16 ENCOUNTER — Other Ambulatory Visit: Payer: Self-pay | Admitting: Internal Medicine

## 2017-01-26 ENCOUNTER — Ambulatory Visit (INDEPENDENT_AMBULATORY_CARE_PROVIDER_SITE_OTHER): Payer: Medicare Other | Admitting: Internal Medicine

## 2017-01-26 ENCOUNTER — Other Ambulatory Visit (INDEPENDENT_AMBULATORY_CARE_PROVIDER_SITE_OTHER): Payer: Medicare Other

## 2017-01-26 ENCOUNTER — Encounter: Payer: Self-pay | Admitting: Internal Medicine

## 2017-01-26 ENCOUNTER — Other Ambulatory Visit: Payer: Self-pay | Admitting: Internal Medicine

## 2017-01-26 VITALS — BP 144/88 | HR 60 | Temp 98.4°F | Ht 61.0 in | Wt 170.5 lb

## 2017-01-26 DIAGNOSIS — M549 Dorsalgia, unspecified: Secondary | ICD-10-CM | POA: Insufficient documentation

## 2017-01-26 DIAGNOSIS — N189 Chronic kidney disease, unspecified: Secondary | ICD-10-CM

## 2017-01-26 DIAGNOSIS — E7800 Pure hypercholesterolemia, unspecified: Secondary | ICD-10-CM | POA: Diagnosis not present

## 2017-01-26 DIAGNOSIS — I10 Essential (primary) hypertension: Secondary | ICD-10-CM | POA: Diagnosis not present

## 2017-01-26 DIAGNOSIS — N1832 Chronic kidney disease, stage 3b: Secondary | ICD-10-CM | POA: Insufficient documentation

## 2017-01-26 DIAGNOSIS — G43809 Other migraine, not intractable, without status migrainosus: Secondary | ICD-10-CM

## 2017-01-26 DIAGNOSIS — G43909 Migraine, unspecified, not intractable, without status migrainosus: Secondary | ICD-10-CM | POA: Insufficient documentation

## 2017-01-26 DIAGNOSIS — K219 Gastro-esophageal reflux disease without esophagitis: Secondary | ICD-10-CM | POA: Diagnosis not present

## 2017-01-26 DIAGNOSIS — G8929 Other chronic pain: Secondary | ICD-10-CM

## 2017-01-26 LAB — COMPREHENSIVE METABOLIC PANEL
ALT: 16 U/L (ref 0–35)
AST: 22 U/L (ref 0–37)
Albumin: 3.8 g/dL (ref 3.5–5.2)
Alkaline Phosphatase: 83 U/L (ref 39–117)
BILIRUBIN TOTAL: 0.2 mg/dL (ref 0.2–1.2)
BUN: 33 mg/dL — ABNORMAL HIGH (ref 6–23)
CALCIUM: 11.6 mg/dL — AB (ref 8.4–10.5)
CHLORIDE: 105 meq/L (ref 96–112)
CO2: 29 meq/L (ref 19–32)
CREATININE: 1.43 mg/dL — AB (ref 0.40–1.20)
GFR: 37.73 mL/min — AB (ref >60.00)
GLUCOSE: 110 mg/dL — AB (ref 70–99)
Potassium: 3.5 mEq/L (ref 3.5–5.1)
SODIUM: 140 meq/L (ref 135–145)
TOTAL PROTEIN: 7.3 g/dL (ref 6.0–8.3)

## 2017-01-26 MED ORDER — AMLODIPINE BESYLATE 2.5 MG PO TABS: 2.5000 mg | ORAL_TABLET | Freq: Every day | ORAL | 5 refills | Status: DC

## 2017-01-26 MED ORDER — BENZONATATE 200 MG PO CAPS: 200.0000 mg | ORAL_CAPSULE | Freq: Three times a day (TID) | ORAL | 0 refills | Status: DC | PRN

## 2017-01-26 NOTE — Assessment & Plan Note (Signed)
GERD controlled Continue nexium every other day

## 2017-01-26 NOTE — Assessment & Plan Note (Signed)
Following with neurology Has decreased excedrin use w/ topamax but still taking too much

## 2017-01-26 NOTE — Assessment & Plan Note (Addendum)
BP Readings from Last 3 Encounters:  01/26/17 (!) 144/88  12/07/16 140/67  08/27/16 (!) 103/59   Not ideally controlled - variable - has hypertensive retinopathy Add norvasc 2.5 mg daily Continue other meds

## 2017-01-26 NOTE — Assessment & Plan Note (Signed)
Continue statin. 

## 2017-01-26 NOTE — Assessment & Plan Note (Signed)
?   Related to tensing up left shoulder - scoliosis vs other neck issues  Related to chronic headaches Dr Tamala Julian for further eval.

## 2017-01-26 NOTE — Progress Notes (Signed)
Pre visit review using our clinic review tool, if applicable. No additional management support is needed unless otherwise documented below in the visit note. 

## 2017-01-26 NOTE — Patient Instructions (Addendum)
  Test(s) ordered today. Your results will be released to [facility] (or called to you) after review, usually within 72hours after test completion. If any changes need to be made, you will be notified at that same time.    Medications reviewed and updated.  Changes include  Adding amlodipine 2.5 mg daily.  Your prescription(s) have been submitted to your pharmacy. Please take as directed and contact our office if you believe you are having problem(s) with the medication(s).  A referral was ordered for dr Tamala Julian for your back pain/ tensing up shoulder  Please followup in 6 months

## 2017-01-26 NOTE — Progress Notes (Signed)
Subjective:    Patient ID: Joy Martin, female    DOB: Nov 02, 1941, 76 y.o.   MRN: [id number]  HPI The patient is here for follow up.  Cough:  It has been there a while.  There are two causes - she has drainage from her sinuses.  She takes flonase.  The drainage is only bad first thing in the morning and at night.  She does not tolerate benadryl or allegra.  When she eats something dry it irritates her throat. Drinking water when she eats sometimes helps.  She has seen Dr Melvyn Novas.   Hypertension: She is taking her medication daily. She is compliant with a low sodium diet.  She denies chest pain, palpitations, edema, shortness of breath and regular headaches. She is exercising regularly -walking.  She does not monitor her blood pressure at home.    Hyperlipidemia: She is taking her medication daily. She is compliant with a low fat/cholesterol diet. She is exercising regularly. She denies myalgias.   GERD:  She is taking her medication every othe rday - she has been able to decrease it from every day.  She denies any regular GERD symptoms and feels her GERD is well controlled.  She has GERD on a rare occasion.   Migraines:  She is seeing neurology.  She was placed on topamax and the dose was increased two months ago.   She is taking excedrin as needed more than ideal.   Back pain:  She often tenses up her left shoulder for unknown reasons.  This causes back pain, hip pain and then can cause neck pain and headaches.  She is unsure why she tenses up her shoulder.   Medications and allergies reviewed with patient and updated if appropriate.  Patient Active Problem List   Diagnosis Date Noted  . Migraine headache 01/26/2017  . Hypertensive retinopathy 11/29/2016  . Osteoarthritis of right knee 07/28/2016  . Basal cell carcinoma of skin 11/26/2015  . Abnormal CXR 01/27/2013  . Postmenopausal symptoms   . Obesity 12/25/2009  . Essential hypertension, benign 12/24/2008  . ALLERGIC  RHINITIS, CHRONIC 12/24/2008  . Hyperlipidemia 12/19/2007  . ANEMIA 12/19/2007  . GERD 12/19/2007  . Osteopenia 12/19/2007    Current Outpatient Prescriptions on File Prior to Visit  Medication Sig Dispense Refill  . Calcium Carbonate (CALCIUM 500 PO) Take by mouth.      . cholecalciferol (VITAMIN D) 1000 UNITS tablet Take 1,000 Units by mouth daily.    . ciclopirox (LOPROX) 0.77 % cream Apply 1 application topically 2 (two) times daily as needed. rash    . cycloSPORINE (RESTASIS) 0.05 % ophthalmic emulsion 1 drop 2 (two) times daily.      Marland Kitchen esomeprazole (NEXIUM) 40 MG capsule TAKE ONE CAPSULE BY MOUTH TWICE A DAY BEFORE A MEAL 180 capsule 2  . Flaxseed, Linseed, (FLAXSEED OIL PO) Take 2 tablets by mouth daily.    . fluticasone (FLONASE) 50 MCG/ACT nasal spray PLACE 1 SPRAY INTO BOTH NOSTRILS DAILY. 16 g 0  . Multiple Vitamins-Minerals (CENTRUM SILVER PO) Take 1 tablet by mouth daily.    . nabumetone (RELAFEN) 750 MG tablet Take 1 tablet (750 mg total) by mouth 2 (two) times daily as needed. Yearly physical due in July must see MD for 90 day supply 60 tablet 0  . nadolol (CORGARD) 40 MG tablet TAKE 1 TABLET (40 MG TOTAL) BY MOUTH 2 (TWO) TIMES DAILY. 180 tablet 3  . simvastatin (ZOCOR) 40 MG tablet Take 1  tablet (40 mg total) by mouth at bedtime. 90 tablet 3  . topiramate (TOPAMAX) 50 MG tablet Take 1 tablet (50 mg total) by mouth 2 (two) times daily. 60 tablet 12  . valsartan-hydrochlorothiazide (DIOVAN-HCT) 320-12.5 MG tablet TAKE 1 TABLET BY MOUTH DAILY. 90 tablet 0   No current facility-administered medications on file prior to visit.     Past Medical History:  Diagnosis Date  . ALLERGIC RHINITIS, CHRONIC   . ANEMIA   . Arthritis    "knees mainly; thumbs"  . 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 Chronic sinusitis    follows with ENT for same  . COPD (chronic obstructive pulmonary disease) ([facility]) dx 2013   GOLD 1, follows  with pulm for same  . Episodic tension type headache   . EXOGENOUS OBESITY   . GERD   . HYPERLIPIDEMIA   . Hypertension   . Migraines 07/10/2012   "over; last one was @ age 30"  . OSTEOPENIA     Past Surgical History:  Procedure Laterality Date  . APPENDECTOMY  07/10/2012  . BREAST CYST ASPIRATION  ?1980's   right  . EYE SURGERY     both eyes  . KNEE ARTHROSCOPY  1970's or 1980's   right; torn cartilage  . LAPAROSCOPIC APPENDECTOMY  07/10/2012   Procedure: APPENDECTOMY LAPAROSCOPIC;  Surgeon: Harl Bowie, MD;  Location: [facility];  Service: General;  Laterality: N/A;  . MOHS SURGERY  01/2016   nose  . MOLE REMOVAL  1957   "my back"  . SKIN CANCER EXCISION     "2 on my back; 2 on my face"  . TONSILLECTOMY AND ADENOIDECTOMY  1946  . [place] EXTRACTION  1959    Social History   Social History  . Marital status: Single    Spouse name: N/A  . Number of children: 0  . Years of education: 44   Occupational History  .      retired Pharmacist, hospital, 5th grade   Social History Main Topics  . Smoking status: Former Smoker    Packs/day: 0.75    Years: 20.00    Types: Cigarettes    Quit date: 07/25/1982  . Smokeless tobacco: Never Used     Comment: 07/10/2012 "stopped smoking cigarettes 1980's"  . Alcohol use Yes     Comment: 07/10/2012 "glass of wine 1-2X/yr"  . Drug use: No  . Sexual activity: No   Other Topics Concern  . 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 Prostate cancer Father   . Cancer Father     prostate  . Cancer Mother     waldenstruns microgobular anemia  . Alcohol abuse Other   . Heart disease Other   . Hypertension Other   . Diabetes Other   . Pancreatic cancer Other   . Cancer Maternal Grandfather     pancreatic    Review of Systems  Constitutional: Negative for fever.  Respiratory: Positive for cough and shortness of breath  (stairs or walking fast). Negative for wheezing.   Cardiovascular: Negative for chest pain, palpitations and leg swelling.  Gastrointestinal: Negative for abdominal pain.  Neurological: Positive for headaches. Negative for dizziness and light-headedness.       Objective:   Vitals:   01/26/17 1050  BP: Marland Kitchen)  144/88  Pulse: 60  Temp: 98.4 F (36.9 C)   Wt Readings from Last 3 Encounters:  01/26/17 170 lb 8 oz (77.3 kg)  12/07/16 176 lb (79.8 kg)  08/27/16 170 lb (77.1 kg)   Body mass index is 32.22 kg/m.   Physical Exam    Constitutional: Appears well-developed and well-nourished. No distress.  HENT:  Head: Normocephalic and atraumatic.  Neck: Neck supple. No tracheal deviation present. No thyromegaly present.  No cervical lymphadenopathy Cardiovascular: Normal rate, regular rhythm and normal heart sounds.   No murmur heard. No carotid bruit .  No edema Pulmonary/Chest: Effort normal and breath sounds normal. No respiratory distress. No has no wheezes. No rales.  Msk, Back: kyphosis, ? Scoliosis, left shoulder lower than right Skin: Skin is warm and dry. Not diaphoretic.  Psychiatric: Normal mood and affect. Behavior is normal.      Assessment & Plan:    See Problem List for Assessment and Plan of chronic medical problems.

## 2017-01-31 ENCOUNTER — Other Ambulatory Visit: Payer: Self-pay | Admitting: Internal Medicine

## 2017-02-01 ENCOUNTER — Encounter: Payer: Self-pay | Admitting: Internal Medicine

## 2017-02-03 ENCOUNTER — Encounter: Payer: Self-pay | Admitting: Internal Medicine

## 2017-02-14 NOTE — Progress Notes (Signed)
[facility] [address], [place] [address] Phone: [phone] Subjective:    I'm seeing this patient by the request  of:  Joy Rail, MD   CC: Left shoulder pain  [id number]  LAQUANDA Martin is a 76 y.o. female coming in with complaint of left shoulder pain. Seems to be more of the left shoulder and posterior aspect. Seems to radiate to the back. Patient states it re of a dull, throbbing aching pain. Has not notice any sign denies any radiation down the arm. Some mild associated neck pain though. Not responding over-the-counter medications.     Past Medical History:  Diagnosis Date  . ALLERGIC RHINITIS, CHRONIC   . ANEMIA   . Arthritis    "knees mainly; thumbs"  . 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 Chronic sinusitis    follows with ENT for same  . COPD (chronic obstructive pulmonary disease) ([place]) dx 2013   GOLD 1, follows with pulm for same  . Episodic tension type headache   . EXOGENOUS OBESITY   . GERD   . HYPERLIPIDEMIA   . Hypertension   . Migraines 07/10/2012   "over; last one was @ age 87"  . OSTEOPENIA    Past Surgical History:  Procedure Laterality Date  . APPENDECTOMY  07/10/2012  . BREAST CYST ASPIRATION  ?1980's   right  . EYE SURGERY     both eyes  . KNEE ARTHROSCOPY  1970's or 1980's   right; torn cartilage  . LAPAROSCOPIC APPENDECTOMY  07/10/2012   Procedure: APPENDECTOMY LAPAROSCOPIC;  Surgeon: Harl Bowie, MD;  Location: [facility];  Service: General;  Laterality: N/A;  . MOHS SURGERY  01/2016   nose  . MOLE REMOVAL  1957   "my back"  . SKIN CANCER EXCISION     "2 on my back; 2 on my face"  . TONSILLECTOMY AND ADENOIDECTOMY  1946  . [place] EXTRACTION  1959   Social History   Social History  . Marital status: Single    Spouse name: N/A  . Number of children: 0  . Years of education: 62   Occupational History  .      retired  Pharmacist, hospital, 5th grade   Social History Main Topics  . Smoking status: Former Smoker    Packs/day: 0.75    Years: 20.00    Types: Cigarettes    Quit date: 07/25/1982  . Smokeless tobacco: Never Used     Comment: 07/10/2012 "stopped smoking cigarettes 1980's"  . Alcohol use Yes     Comment: 07/10/2012 "glass of wine 1-2X/yr"  . Drug use: No  . Sexual activity: No   Other Topics Concern  . 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 Allegra [Fexofenadine Hcl] Other (See Comments)    Headache, "took the pill; I got flu symptoms"   Family History  Problem Relation Age of Onset  . Prostate cancer Father   . Cancer Father     prostate  . Cancer Mother     waldenstruns microgobular anemia  . Alcohol abuse Other   . Heart disease Other   . Hypertension Other   . Diabetes Other   . Pancreatic cancer Other   . Cancer Maternal Grandfather     pancreatic  Past medical history, social, surgical and family history all reviewed in electronic medical record.  No pertanent information unless stated regarding to the chief complaint.   Review of Systems:Review of systems updated and as accurate as of 02/15/17  No headache, visual changes, nausea, vomiting, diarrhea, constipation, dizziness, abdominal pain, skin rash, fevers, chills, night sweats, weight loss, swollen lymph nodes,  chest pain, shortness of breath, mood changes.  Positive muscle aches and body aches  Objective  Blood pressure 128/84, pulse (!) 109, resp. rate 14, height 5\' 1"  (1.549 m), weight 169 lb 2 oz (76.7 kg), SpO2 94 %. Systems examined below as of 02/15/17   General: No apparent distress alert and oriented x3 mood and affect normal, dressed appropriately.  HEENT: Pupils equal, extraocular movements intact  Respiratory: Patient's speak in full sentences and does not appear short of breath  Cardiovascular: No lower  extremity edema, non tender, no erythema  Skin: Warm dry intact with no signs of infection or rash on extremities or on axial skeleton.  Abdomen: Soft nontender  Neuro: Cranial nerves II through XII are intact, neurovascularly intact in all extremities with 2+ DTRs and 2+ pulses.  Lymph: No lymphadenopathy of posterior or anterior cervical chain or axillae bilaterally.  Gait normal with good balance and coordination.  MSK:  Non tender with full range of motion and good stability and symmetric strength and tone of elbows, wrist, hip, knee and ankles bilaterally.  Severe arthritic changes of multiple joints Shoulder: left Severe atrophy of the musculature especially the posterior shoulders bilaterally no Palpation is normal with no tenderness over AC joint or bicipital groove. ROM is full in all planes passively. Rotator cuff strength normal throughout. signs of impingement with positive Neer and Hawkin's tests, but negative empty can sign. Speeds and Yergason's tests normal. No labral pathology noted with negative Obrien's, negative clunk and good stability. Positive crossover Normal scapular function observed. No painful arc and no drop arm sign. No apprehension sign  MSK US performed of: left This study was ordered, performed, and interpreted by Charlann Boxer D.O.  Shoulder:   Supraspinatus:  Appears normal on long and transverse views, Bursal bulge seen with shoulder abduction on impingement view. Mild overall Infraspinatus:  Appears normal on long and transverse views. Significant increase in Doppler flow Subscapularis:  Appears normal on long and transverse views. Positive bursa Teres Minor:  Appears normal on long and transverse views. AC joint:  Moderate arthritis with capsular distention Glenohumeral Joint:  Appears normal without effusion. Glenoid Labrum:  Intact without visualized tears. Biceps Tendon:  Appears normal on long and transverse views, no fraying of tendon, tendon  located in intertubercular groove, no subluxation with shoulder internal or external rotation.  Impression: Subacromial bursitis moderate arthritic changes of the acromial clavicular joint   Procedure note 83662; 15 minutes spent for Therapeutic exercises as stated in above notes.  This included exercises focusing on stretching, strengthening, with significant focus on eccentric aspects. Shoulder Exercises that included:  Basic scapular stabilization to include adduction and depression of scapula Scaption, focusing on proper movement and good control Internal and External rotation utilizing a theraband, with elbow tucked at side entire time Rows with theraband    Proper technique shown and discussed handout in great detail with ATC.  All questions were discussed and answered.     Impression and Recommendations:     This case required medical decision making of moderate complexity.      Note: This dictation was prepared with Viviann Spare  dictation along with smaller phrase technology. Any transcriptional errors that result from this process are unintentional.

## 2017-02-15 ENCOUNTER — Ambulatory Visit (INDEPENDENT_AMBULATORY_CARE_PROVIDER_SITE_OTHER): Payer: Medicare Other | Admitting: Family Medicine

## 2017-02-15 ENCOUNTER — Ambulatory Visit: Payer: Self-pay

## 2017-02-15 ENCOUNTER — Encounter: Payer: Self-pay | Admitting: Family Medicine

## 2017-02-15 VITALS — BP 128/84 | HR 109 | Resp 14 | Ht 61.0 in | Wt 169.1 lb

## 2017-02-15 DIAGNOSIS — M7552 Bursitis of left shoulder: Secondary | ICD-10-CM | POA: Insufficient documentation

## 2017-02-15 DIAGNOSIS — M19019 Primary osteoarthritis, unspecified shoulder: Secondary | ICD-10-CM | POA: Diagnosis not present

## 2017-02-15 DIAGNOSIS — M25512 Pain in left shoulder: Secondary | ICD-10-CM

## 2017-02-15 NOTE — Progress Notes (Signed)
Pre-visit discussion using our clinic review tool. No additional management support is needed unless otherwise documented below in the visit note.  

## 2017-02-15 NOTE — Assessment & Plan Note (Signed)
Patient does have more of a chronic bursitis noted. Patient is going to do well with conservative therapy at thing. Patient with continue the same medications were given topical anti-inflammatories. We discussed icing regimen. No signs of any cervical radiculopathy. Patient does have significant muscle weakness that he think is contribute in. 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 4-6 weeks.

## 2017-02-15 NOTE — Patient Instructions (Signed)
Good to see you.  pennsaid pinkie amount topically 2 times daily as needed.  Ice 20 minutes 2 times daily. Usually after activity and before bed. Keep hands within peripheral vision  Vitamin D 2000 IU daily  Tart cherry extract any dose at night See me again in 4-6 weeks and if not better we may try an injection in the [facility] joint.

## 2017-02-20 ENCOUNTER — Other Ambulatory Visit: Payer: Self-pay | Admitting: Internal Medicine

## 2017-02-21 NOTE — Telephone Encounter (Signed)
Please advise 

## 2017-03-15 LAB — HM DIABETES EYE EXAM

## 2017-03-20 ENCOUNTER — Other Ambulatory Visit: Payer: Self-pay | Admitting: Internal Medicine

## 2017-03-23 ENCOUNTER — Ambulatory Visit (INDEPENDENT_AMBULATORY_CARE_PROVIDER_SITE_OTHER): Payer: Medicare Other | Admitting: Family Medicine

## 2017-03-23 ENCOUNTER — Encounter: Payer: Self-pay | Admitting: Family Medicine

## 2017-03-23 VITALS — BP 106/74 | HR 62 | Ht 61.0 in | Wt 186.0 lb

## 2017-03-23 DIAGNOSIS — M7552 Bursitis of left shoulder: Secondary | ICD-10-CM

## 2017-03-23 NOTE — Patient Instructions (Addendum)
Good to see you  We will get you in with PT  You are making progress Stay active.  Keep hands within peripheral vision.  Continue the vitamins I will see you again in 6 weeks and discuss if we need injections or something else.

## 2017-03-23 NOTE — Progress Notes (Signed)
[facility] [address] [place], [place] [address] Phone: [phone] Subjective:    I'm seeing this patient by the request  of:  [name], MD   CC: Left shoulder pain f/u   [id number]  SHENEKIA RIESS is a 76 y.o. female coming in with complaint of left shoulder pain. Seems to be more of the left shoulder and posterior aspect. Patient was seen by me and did have more of a mild bursitis as well as acromioclavicular arthritis. Patient was doing home exercises. States that she is 60% better. Still affecting certain daily activities such as over the head lifting. Patient states also certain movements and when dressings still causes the pain. Denies any new symptoms.     Past Medical History:  Diagnosis Date  . ALLERGIC RHINITIS, CHRONIC   . ANEMIA   . Arthritis    "knees mainly; thumbs"  . 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 Chronic sinusitis    follows with ENT for same  . COPD (chronic obstructive pulmonary disease) ([place]) dx 2013   GOLD 1, follows with pulm for same  . Episodic tension type headache   . EXOGENOUS OBESITY   . GERD   . HYPERLIPIDEMIA   . Hypertension   . Migraines 07/10/2012   "over; last one was @ age 41"  . OSTEOPENIA    Past Surgical History:  Procedure Laterality Date  . APPENDECTOMY  07/10/2012  . BREAST CYST ASPIRATION  ?1980's   right  . EYE SURGERY     both eyes  . KNEE ARTHROSCOPY  1970's or 1980's   right; torn cartilage  . LAPAROSCOPIC APPENDECTOMY  07/10/2012   Procedure: APPENDECTOMY LAPAROSCOPIC;  Surgeon: Harl Bowie, MD;  Location: [facility];  Service: General;  Laterality: N/A;  . MOHS SURGERY  01/2016   nose  . MOLE REMOVAL  1957   "my back"  . SKIN CANCER EXCISION     "2 on my back; 2 on my face"  . TONSILLECTOMY AND ADENOIDECTOMY  1946  . [place] EXTRACTION  1959   Social History   Social History  . Marital status: Single      Spouse name: N/A  . Number of children: 0  . Years of education: 11   Occupational History  .      retired Pharmacist, hospital, 5th grade   Social History Main Topics  . Smoking status: Former Smoker    Packs/day: 0.75    Years: 20.00    Types: Cigarettes    Quit date: 07/25/1982  . Smokeless tobacco: Never Used     Comment: 07/10/2012 "stopped smoking cigarettes 1980's"  . Alcohol use Yes     Comment: 07/10/2012 "glass of wine 1-2X/yr"  . Drug use: No  . Sexual activity: No   Other Topics Concern  . 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 Allegra [Fexofenadine Hcl] Other (See Comments)    Headache, "took the pill; I got flu symptoms"   Family History  Problem Relation Age of Onset  . Prostate cancer Father   . Cancer Father        prostate  . Cancer Mother        waldenstruns microgobular anemia  . Alcohol abuse Other   . Heart disease Other   .  Hypertension Other   . Diabetes Other   . Pancreatic cancer Other   . Cancer Maternal Grandfather        pancreatic    Past medical history, social, surgical and family history all reviewed in electronic medical record.  No pertanent information unless stated regarding to the chief complaint.   Review of Systems: No headache, visual changes, nausea, vomiting, diarrhea, constipation, dizziness, abdominal pain, skin rash, fevers, chills, night sweats, weight loss, swollen lymph nodes, body aches, joint swelling, muscle aches, chest pain, shortness of breath, mood changes.    Objective  Blood pressure 106/74, pulse 62, height 5\' 1"  (1.549 m), weight 186 lb (84.4 kg), SpO2 94 %.  Systems examined below as of 03/23/17 General: NAD A&O x3 mood, affect normal  HEENT: Pupils equal, extraocular movements intact no nystagmus Respiratory: not short of breath at rest or with speaking Cardiovascular: No lower extremity edema, non  tender Skin: Warm dry intact with no signs of infection or rash on extremities or on axial skeleton. Abdomen: Soft nontender, no masses Neuro: Cranial nerves  intact, neurovascularly intact in all extremities with 2+ DTRs and 2+ pulses. Lymph: No lymphadenopathy appreciated today  Gait normal with good balance and coordination.  MSK: Non tender with full range of motion and good stability and symmetric strength and tone of shoulders, elbows, wrist,  knee hips and ankles bilaterally.  Significant arthritic changes of multiple joints Shoulder: left Increased kyphosis of the upper back continued atrophy of the musculature of the shoulder  Palpation is normal with no tenderness over AC joint or bicipital groove. Mild loss of 5 in all planes passively Rotator cuff strength normal throughout. signs of impingement with positive Neer and Hawkin's tests, but negative empty can sign. Speeds and Yergason's tests normal. Positive O'Brien's and positive crossover Normal scapular function observed. No painful arc and no drop arm sign. No apprehension sign Contralateral shoulder also shows some atrophy of the posterior musculature of the shoulder but nontender with no signs of impingement.    Impression and Recommendations:     This case required medical decision making of moderate complexity.      Note: This dictation was prepared with Dragon dictation along with smaller phrase technology. Any transcriptional errors that result from this process are unintentional.

## 2017-03-23 NOTE — Assessment & Plan Note (Signed)
Patient has been doing fairly well. Patient still having some discomfort and would like more improvement. Sent to physical therapy for further evaluation and treatment. I do think that this will be helpful with some of the activities of daily living and to teach home exercises. Continue the topical anti-inflammatory as well as the over-the-counter natural supple mentation's. Follow-up again in 4-6 weeks. Worsening symptoms we'll consider injection.

## 2017-04-06 ENCOUNTER — Ambulatory Visit (INDEPENDENT_AMBULATORY_CARE_PROVIDER_SITE_OTHER): Payer: Medicare Other | Admitting: Physical Therapy

## 2017-04-06 DIAGNOSIS — G8929 Other chronic pain: Secondary | ICD-10-CM

## 2017-04-06 DIAGNOSIS — M6281 Muscle weakness (generalized): Secondary | ICD-10-CM

## 2017-04-06 DIAGNOSIS — M25512 Pain in left shoulder: Secondary | ICD-10-CM

## 2017-04-06 DIAGNOSIS — R293 Abnormal posture: Secondary | ICD-10-CM | POA: Diagnosis not present

## 2017-04-06 NOTE — Patient Instructions (Signed)
Scapular Retraction (Standing)    With arms at sides, pinch shoulder blades together.  "Elbows to your back pockets."  Hold 5 seconds. Repeat __10-15__ times per set. Do __1__ sets per session. Do _2-3___ sessions per day.  http://orth.exer.us/944   Copyright  VHI. All rights reserved.    Scapula Adduction With Pectoralis Stretch: Low - Standing   Shoulders at 45 hands even with shoulders, keeping weight through legs, shift weight forward until you feel pull or stretch through the front of your chest. Hold _30__ seconds. Do _3__ times, _2-4__ times per day.

## 2017-04-06 NOTE — Therapy (Signed)
[facility] [address] [place], [place], [address] Phone: [phone]   Fax:  [phone]  Physical Therapy Evaluation  Patient Details  Name: Joy Martin MRN: [id number] Date of Birth: Oct 27, 1941 Referring Provider: Dr. Charlann Boxer  Encounter Date: 04/06/2017      PT End of Session - 04/06/17 1147    Visit Number 1   Number of Visits 12   Date for PT Re-Evaluation 05/18/17   Authorization Type [employer] Medicare   PT Start Time 1109   PT Stop Time 1139   PT Time Calculation (min) 30 min   Activity Tolerance Patient tolerated treatment well   Behavior During Therapy [facility] for tasks assessed/performed      Past Medical History:  Diagnosis Date  . ALLERGIC RHINITIS, CHRONIC   . ANEMIA   . Arthritis    "knees mainly; thumbs"  . 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 Chronic sinusitis    follows with ENT for same  . COPD (chronic obstructive pulmonary disease) ([place]) dx 2013   GOLD 1, follows with pulm for same  . Episodic tension type headache   . EXOGENOUS OBESITY   . GERD   . HYPERLIPIDEMIA   . Hypertension   . Migraines 07/10/2012   "over; last one was @ age 67"  . OSTEOPENIA     Past Surgical History:  Procedure Laterality Date  . APPENDECTOMY  07/10/2012  . BREAST CYST ASPIRATION  ?1980's   right  . EYE SURGERY     both eyes  . KNEE ARTHROSCOPY  1970's or 1980's   right; torn cartilage  . LAPAROSCOPIC APPENDECTOMY  07/10/2012   Procedure: APPENDECTOMY LAPAROSCOPIC;  Surgeon: Harl Bowie, MD;  Location: [facility];  Service: General;  Laterality: N/A;  . MOHS SURGERY  01/2016   nose  . MOLE REMOVAL  1957   "my back"  . SKIN CANCER EXCISION     "2 on my back; 2 on my face"  . TONSILLECTOMY AND ADENOIDECTOMY  1946  . WISDOM TOOTH EXTRACTION  1959    There were no vitals filed for this visit.       Subjective Assessment - 04/06/17 1111    Subjective  Pt is a 76 y/o female who presents to OPPT with Lt shoulder pain for a "few months."  Pt reports no known injury, but noticed she was guarding and keeping Lt shoulder elevated.  Pt also reports some Lt side mid thoracic pain which returned recently when helping a friend move.  Pt given some exercises at MD office, but hasn't been able to perform them since last week.     Pertinent History see snapshot   Limitations House hold activities;Lifting  overhead lifting   Diagnostic tests arthritis, bursitis, and "no muscles"   Patient Stated Goals improve pain and functional use   Currently in Pain? Yes   Pain Score 6    Pain Location Shoulder   Pain Orientation Left   Pain Descriptors / Indicators Constant;Sharp   Pain Type Chronic pain   Pain Onset More than a month ago   Pain Frequency Intermittent   Aggravating Factors  overhead movement and abduction   Pain Relieving Factors "getting shoulder in the right position" - seems to prefer neutral   Effect of Pain on Daily Activities trouble sleeping, minimal modifications with dressing            [facility] PT Assessment -  04/06/17 1117      Assessment   Medical Diagnosis Lt shoulder pain   Referring Provider Dr. Charlann Boxer   Onset Date/Surgical Date --  4-5 months approx   Hand Dominance Right   Next MD Visit 05/04/17   Prior Therapy none     Precautions   Precautions None     Restrictions   Weight Bearing Restrictions No     Balance Screen   Has the patient fallen in the past 6 months No   Has the patient had a decrease in activity level because of a fear of falling?  No   Is the patient reluctant to leave their home because of a fear of falling?  No     Home Environment   Living Environment Private residence   Living Arrangements Alone  medium sized dog     Prior Function   Level of Independence Independent   Vocation Retired   Clinical research associate, Chemical engineer, play computer games, reading, go to movies     Cognition   Overall  Cognitive Status Within Functional Limits for tasks assessed     Posture/Postural Control   Posture/Postural Control Postural limitations   Postural Limitations Rounded Shoulders;Forward head;Increased thoracic kyphosis     AROM   Overall AROM Comments bil shoulders WNL except pain with Lt end range flexion and abduction   AROM Assessment Site Shoulder   Right/Left Shoulder --     Strength   Strength Assessment Site Shoulder;Elbow   Right/Left Shoulder Right;Left   Right Shoulder Flexion 4/5   Right Shoulder Extension 5/5   Right Shoulder ABduction 3+/5   Right Shoulder Internal Rotation 4/5   Right Shoulder External Rotation 4/5   Left Shoulder Flexion 3/5   Left Shoulder Extension 5/5   Left Shoulder ABduction 3/5   Left Shoulder Internal Rotation 3-/5  with pain   Left Shoulder External Rotation 3/5   Right/Left Elbow Right;Left   Right Elbow Flexion 5/5   Right Elbow Extension 5/5   Left Elbow Flexion 5/5   Left Elbow Extension 5/5     Palpation   Palpation comment tenderness Lt supraspinatus and infraspinatus, tightness in UT and levator            Objective measurements completed on examination: See above findings.          [facility] Adult PT Treatment/Exercise - 04/06/17 1117      Self-Care   Self-Care Other Self-Care Comments   Other Self-Care Comments  pt described HEP given at MD office (didn't have paper with her) and recommended pt bring to next session to review and update PRN     Exercises   Exercises Shoulder     Shoulder Exercises: Standing   Retraction Both;10 reps   Retraction Limitations 5 sec hold with min cues for technique     Shoulder Exercises: Stretch   Other Shoulder Stretches low doorway stretch x 30 sec                PT Education - 04/06/17 1147    Education provided Yes   Education Details initial posture HEP, instructed to bring HEP from MD office to review and update   Person(s) Educated Patient   Methods  Explanation;Demonstration;Handout   Comprehension Verbalized understanding;Returned demonstration;Need further instruction             PT Long Term Goals - 04/06/17 1152      PT LONG TERM GOAL #1   Title independent with HEP (05/18/17)  Time 6   Period Weeks   Status New     PT LONG TERM GOAL #2   Title verbalize understanding of posture/body mechanics to decrease risk of reinjury (05/18/17)   Time 6   Period Weeks   Status New     PT LONG TERM GOAL #3   Title perform Lt shoulder AROM without pain for improved function (05/18/17)   Time 6   Period Weeks   Status New     PT LONG TERM GOAL #4   Title demonstrate 4/5 strength Lt shoulder for improved strength and function (05/18/17)   Time 6   Period Weeks   Status New     PT LONG TERM GOAL #5   Title report 75% improvement in Lt shoulder pain with sleeping for improved function (05/18/17)   Time 6   Period Weeks   Status New                Plan - 04/13/2017 1148    Clinical Impression Statement Pt is a 76 y/o female who presents to OPPT with 4-5 month hx of Lt shoulder pain.  Pt demonstrates poor postural awareness, decreased strength, increased muscle tightness and pain affecting functional activities.  Pt will benefit from PT to address deficits listed.   History and Personal Factors relevant to plan of care: migraines   Clinical Presentation Stable   Clinical Presentation due to: pain stable   Clinical Decision Making Low   Rehab Potential Good   PT Frequency 2x / week   PT Duration 6 weeks   PT Treatment/Interventions ADLs/Self Care Home Management;Cryotherapy;Electrical Stimulation;Iontophoresis 4mg /ml Dexamethasone;Moist Heat;Ultrasound;Therapeutic exercise;Therapeutic activities;Functional mobility training;Patient/family education;Manual techniques;Vasopneumatic Device;Taping;Dry needling   PT Next Visit Plan review HEP given today and at MD office (pt to bring), RTC strengthening and posture exercises    Consulted and Agree with Plan of Care Patient      Patient will benefit from skilled therapeutic intervention in order to improve the following deficits and impairments:  Pain, Impaired UE functional use, Increased fascial restricitons, Decreased strength, Postural dysfunction  Visit Diagnosis: Chronic left shoulder pain - Plan: PT plan of care cert/re-cert  Abnormal posture - Plan: PT plan of care cert/re-cert  Muscle weakness (generalized) - Plan: PT plan of care cert/re-cert      [facility] PT PB G-CODES - 13-Apr-2017 1159    Functional Assessment Tool Used  clinical judgement; strength 3/5; ROM WNL except pain   Functional Limitations Carrying, moving and handling objects   Carrying, Moving and Handling Objects Current Status (Z1696) At least 40 percent but less than 60 percent impaired, limited or restricted   Carrying, Moving and Handling Objects Goal Status (V8938) At least 1 percent but less than 20 percent impaired, limited or restricted       Problem List Patient Active Problem List   Diagnosis Date Noted  . Chronic shoulder bursitis, left 02/15/2017  . 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 Migraine headache 01/26/2017  . Back pain 01/26/2017  . Chronic kidney disease 01/26/2017  . Hypertensive retinopathy 11/29/2016  . Osteoarthritis of right knee 07/28/2016  . Basal cell carcinoma of skin 11/26/2015  . Abnormal CXR 01/27/2013  . Postmenopausal symptoms   . Obesity 12/25/2009  . Essential hypertension, benign 12/24/2008  . ALLERGIC RHINITIS, CHRONIC 12/24/2008  . Hyperlipidemia 12/19/2007  . ANEMIA 12/19/2007  . GERD 12/19/2007  . Osteopenia 12/19/2007      Laureen Abrahams, PT, DPT 04/13/2017 12:01 PM    [facility] [address] [place], [place], [address] Phone: [phone]   Fax:  [phone]  Name: CASSANDRIA DREW MRN: [id number] Date of Birth: May 13, 1941

## 2017-04-12 ENCOUNTER — Ambulatory Visit (INDEPENDENT_AMBULATORY_CARE_PROVIDER_SITE_OTHER): Payer: Medicare Other | Admitting: Diagnostic Neuroimaging

## 2017-04-12 ENCOUNTER — Encounter: Payer: Self-pay | Admitting: Diagnostic Neuroimaging

## 2017-04-12 VITALS — BP 117/72 | HR 60 | Ht 61.0 in | Wt 170.4 lb

## 2017-04-12 DIAGNOSIS — G8929 Other chronic pain: Secondary | ICD-10-CM | POA: Diagnosis not present

## 2017-04-12 DIAGNOSIS — G43109 Migraine with aura, not intractable, without status migrainosus: Secondary | ICD-10-CM

## 2017-04-12 DIAGNOSIS — M25512 Pain in left shoulder: Secondary | ICD-10-CM

## 2017-04-12 MED ORDER — TOPIRAMATE 50 MG PO TABS: 50.0000 mg | ORAL_TABLET | Freq: Two times a day (BID) | ORAL | 4 refills | Status: DC

## 2017-04-12 NOTE — Progress Notes (Signed)
[facility]  PATIENT: Joy Martin DOB: 1941-10-25  REFERRING CLINICIAN: S Burns HISTORY FROM: patient  REASON FOR VISIT: follow up   HISTORICAL  CHIEF COMPLAINT:  Chief Complaint  Patient presents with  . Head ache    stopping excedrin for another medication    HISTORY OF PRESENT ILLNESS:   UPDATE 04/12/17: Since last visit, HA are improving. On TPX 50mg  twice a day. Continues on excedrin 2-4 tabs per day. Now with more left shoulder pain, radiating to neck and head. Seeing sports medicine clinic and trying home exercises.  UPDATE 12/07/16: Since last visit visit, continues with daily headaches. Has slightly reduced excedrin to 2-4 tabs per day. Tolerating TPX 25mg  daily.   PRIOR HPI (08/27/16): 76 year old right-handed female here for evaluation of headaches. Around age 76 years old patient had onset of right-sided headaches with right eye pain, throbbing, nausea, proceeded by visual disturbance and aura. Patient was diagnosed with migraine headaches. During factors include menstrual cycle and red wine. She averaged 2-3 headaches per month each lasting 1-2 days. Patient was evaluated by the headache clinic in the past and tried nadolol, Imitrex, over-the-counter medications. By age 76 years old patient was going through menopause and her migraine headaches subsided. Over many years patient has also had lower grade dull sore and intermittent stabbing headaches sometimes on the right side, sometimes on the top of her head. No other associated factors. These been going on for at least 10-20 years. Patient has gradually built up to using 2-6 tablets of Excedrin over-the-counter on a daily basis. This is been going on for many many years. Patient has tried weaning off in the past but this caused rebound headaches. Patient also was taking Fioricet 20 tablets per month for many years. Patient now establish with a new PCP who is reviewing medication list and advising patient  to reduce medication overusage which may be triggering analgesic overuse headache and rebound headache. Patient referred to me for further evaluation consideration of prophylactic headache therapy.   REVIEW OF SYSTEMS: Full 14 system review of systems performed and negative with exception of: heat intolerance cough ringing in ears aching muscles joint pain SOB runny nose.    ALLERGIES: Allergies  Allergen Reactions  . Allegra [Fexofenadine Hcl] Other (See Comments)    Headache, "took the pill; I got flu symptoms"    HOME MEDICATIONS: Outpatient Medications Prior to Visit  Medication Sig Dispense Refill  . amLODipine (NORVASC) 2.5 MG tablet Take 1 tablet (2.5 mg total) by mouth daily. 30 tablet 5  . benzonatate (TESSALON) 200 MG capsule TAKE 1 CAPSULE (200 MG TOTAL) BY MOUTH 3 (THREE) TIMES DAILY AS NEEDED FOR COUGH. 30 capsule 0  . Calcium Carbonate (CALCIUM 500 PO) Take by mouth.      . cholecalciferol (VITAMIN D) 1000 UNITS tablet Take 1,000 Units by mouth daily.    . ciclopirox (LOPROX) 0.77 % cream Apply 1 application topically 2 (two) times daily as needed. rash    . cycloSPORINE (RESTASIS) 0.05 % ophthalmic emulsion 1 drop 2 (two) times daily.      Marland Kitchen esomeprazole (NEXIUM) 40 MG capsule TAKE ONE CAPSULE BY MOUTH TWICE A DAY BEFORE A MEAL 180 capsule 2  . Flaxseed, Linseed, (FLAXSEED OIL PO) Take 2 tablets by mouth daily.    . fluticasone (FLONASE) 50 MCG/ACT nasal spray PLACE 1 SPRAY INTO BOTH NOSTRILS DAILY. 16 g 0  . Multiple Vitamins-Minerals (CENTRUM SILVER PO) Take 1 tablet by mouth daily.    Marland Kitchen  nabumetone (RELAFEN) 750 MG tablet Take 1 tablet (750 mg total) by mouth 2 (two) times daily as needed. 60 tablet 1  . nadolol (CORGARD) 40 MG tablet TAKE 1 TABLET (40 MG TOTAL) BY MOUTH 2 (TWO) TIMES DAILY. 180 tablet 3  . simvastatin (ZOCOR) 40 MG tablet Take 1 tablet (40 mg total) by mouth at bedtime. 90 tablet 3  . topiramate (TOPAMAX) 50 MG tablet Take 1 tablet (50 mg total) by  mouth 2 (two) times daily. 60 tablet 12  . valsartan-hydrochlorothiazide (DIOVAN-HCT) 320-12.5 MG tablet TAKE 1 TABLET BY MOUTH DAILY. 90 tablet 2   No facility-administered medications prior to visit.     PAST MEDICAL HISTORY: Past Medical History:  Diagnosis Date  . ALLERGIC RHINITIS, CHRONIC   . ANEMIA   . Arthritis    "knees mainly; thumbs"  . 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 Chronic sinusitis    follows with ENT for same  . COPD (chronic obstructive pulmonary disease) ([place]) dx 2013   GOLD 1, follows with pulm for same  . Episodic tension type headache   . EXOGENOUS OBESITY   . GERD   . HYPERLIPIDEMIA   . Hypertension   . Migraines 07/10/2012   "over; last one was @ age 76"  . OSTEOPENIA     PAST SURGICAL HISTORY: Past Surgical History:  Procedure Laterality Date  . APPENDECTOMY  07/10/2012  . BREAST CYST ASPIRATION  ?1980's   right  . EYE SURGERY     both eyes  . KNEE ARTHROSCOPY  1970's or 1980's   right; torn cartilage  . LAPAROSCOPIC APPENDECTOMY  07/10/2012   Procedure: APPENDECTOMY LAPAROSCOPIC;  Surgeon: Harl Bowie, MD;  Location: [facility];  Service: General;  Laterality: N/A;  . MOHS SURGERY  01/2016   nose  . MOLE REMOVAL  1957   "my back"  . SKIN CANCER EXCISION     "2 on my back; 2 on my face"  . TONSILLECTOMY AND ADENOIDECTOMY  1946  . WISDOM TOOTH EXTRACTION  1959    FAMILY HISTORY: Family History  Problem Relation Age of Onset  . Prostate cancer Father   . Cancer Father        prostate  . Cancer Mother        waldenstruns microgobular anemia  . Alcohol abuse Other   . Heart disease Other   . Hypertension Other   . Diabetes Other   . Pancreatic cancer Other   . Cancer Maternal Grandfather        pancreatic    SOCIAL HISTORY:  Social History   Social History  . Marital status: Single    Spouse name: N/A  . Number of children: 0  . Years of education: 70    Occupational History  .      retired Pharmacist, hospital, 5th grade   Social History Main Topics  . Smoking status: Former Smoker    Packs/day: 0.75    Years: 20.00    Types: Cigarettes    Quit date: 07/25/1982  . Smokeless tobacco: Never Used     Comment: 07/10/2012 "stopped smoking cigarettes 1980's"  . Alcohol use Yes     Comment: 07/10/2012 "glass of wine 1-2X/yr"  . Drug use: No  . Sexual activity: No   Other Topics Concern  . Not on file   Social History Narrative   Retired Pharmacist, hospital, 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   Caffeine- coffee 1 cup,  some Coke     PHYSICAL EXAM  GENERAL EXAM/CONSTITUTIONAL: Vitals:  Vitals:   04/12/17 1104  BP: 117/72  Pulse: 60  Weight: 170 lb 6 oz (77.3 kg)  Height: 5\' 1"  (1.549 m)   Body mass index is 32.19 kg/m. No exam data present  Patient is in no distress; well developed, nourished and groomed; neck is supple  CARDIOVASCULAR:  Examination of carotid arteries is normal; no carotid bruits  Regular rate and rhythm, no murmurs  Examination of peripheral vascular system by observation and palpation is normal  EYES:  Ophthalmoscopic exam of optic discs and posterior segments is normal; no papilledema or hemorrhages  MUSCULOSKELETAL:  Gait, strength, tone, movements noted in Neurologic exam below  NEUROLOGIC: MENTAL STATUS:  MMSE - Mini Mental State Exam 07/28/2016  Not completed: (No Data)    awake, alert, oriented to person, place and time  recent and remote memory intact  normal attention and concentration  language fluent, comprehension intact, naming intact,   fund of knowledge appropriate  CRANIAL NERVE:   2nd - no papilledema on fundoscopic exam  2nd, 3rd, 4th, 6th - pupils equal and reactive to light, visual fields full to confrontation, extraocular muscles intact, no nystagmus  5th - facial sensation symmetric  7th - facial strength symmetric  8th - hearing intact  9th - palate elevates symmetrically, uvula  midline  11th - shoulder shrug symmetric  12th - tongue protrusion midline  MOTOR:   normal bulk and tone, full strength in the BUE, BLE  ABLE TO RAISE BOTH HANDS OVERHEAD  SENSORY:   normal and symmetric to light touch, temperature, vibration  COORDINATION:   finger-nose-finger, fine finger movements normal  REFLEXES:   deep tendon reflexes present and symmetric  GAIT/STATION:   narrow based gait    DIAGNOSTIC DATA (LABS, IMAGING, TESTING) - I reviewed patient records, labs, notes, testing and imaging myself where available.  Lab Results  Component Value Date   WBC 7.8 07/28/2016   HGB 12.1 07/28/2016   HCT 36.0 07/28/2016   MCV 90.9 07/28/2016   PLT 355.0 07/28/2016      Component Value Date/Time   NA 140 01/26/2017 1127   K 3.5 01/26/2017 1127   CL 105 01/26/2017 1127   CO2 29 01/26/2017 1127   GLUCOSE 110 (H) 01/26/2017 1127   BUN 33 (H) 01/26/2017 1127   CREATININE 1.43 (H) 01/26/2017 1127   CALCIUM 11.6 (H) 01/26/2017 1127   PROT 7.3 01/26/2017 1127   ALBUMIN 3.8 01/26/2017 1127   AST 22 01/26/2017 1127   ALT 16 01/26/2017 1127   ALKPHOS 83 01/26/2017 1127   BILITOT 0.2 01/26/2017 1127   GFRNONAA 53 (L) 07/10/2012 1120   GFRAA 61 (L) 07/10/2012 1120   Lab Results  Component Value Date   CHOL 155 07/28/2016   HDL 61.70 07/28/2016   LDLCALC 66 07/28/2016   LDLDIRECT 143.2 12/19/2007   TRIG 136.0 07/28/2016   CHOLHDL 3 07/28/2016   Lab Results  Component Value Date   HGBA1C 6.5 10/25/2012   No results found for: VITAMINB12 Lab Results  Component Value Date   TSH 1.87 07/28/2016    02/14/13 CT sinuses - Pansinusitis. Findings likely represent a combination of acute and chronic sinus disease.     ASSESSMENT AND PLAN  76 y.o. year old female here with History of migraine headaches since age 73 years old, with at least 10 years of low-grade chronic daily headache, tension headache, analgesic overuse headache. Some left shoulder  pain  is new, which radiates to the neck and head.   Dx:  1. Migraine with aura and without status migrainosus, not intractable   2. Chronic left shoulder pain      PLAN:  I spent 15 minutes of face to face time with patient. Greater than 50% of time was spent in counseling and coordination of care with patient. In summary we discussed:   MIGRAINE HEADACHES (established problem, improved) - continue topiramate 50mg  twice a day (drink plenty of water; monitor for side effects) - continue to reduce excedrin OTC over time; goal would be to use no more than 5-10 tabs per month  LEFT SHOULDER PAIN (new problem, no additional workup) - continue home exercises  Meds ordered this encounter  Medications  . topiramate (TOPAMAX) 50 MG tablet    Sig: Take 1 tablet (50 mg total) by mouth 2 (two) times daily.    Dispense:  180 tablet    Refill:  4   Return in about 1 year (around 04/12/2018).    Penni Bombard, MD 05/10/7105, 26:94 PM Certified in Neurology, Neurophysiology and Neuroimaging  [facility] [address] [place], [place] [address] [phone]

## 2017-04-13 ENCOUNTER — Ambulatory Visit (INDEPENDENT_AMBULATORY_CARE_PROVIDER_SITE_OTHER): Payer: Medicare Other | Admitting: Physical Therapy

## 2017-04-13 DIAGNOSIS — M6281 Muscle weakness (generalized): Secondary | ICD-10-CM

## 2017-04-13 DIAGNOSIS — M25512 Pain in left shoulder: Secondary | ICD-10-CM | POA: Diagnosis not present

## 2017-04-13 DIAGNOSIS — G8929 Other chronic pain: Secondary | ICD-10-CM

## 2017-04-13 DIAGNOSIS — R293 Abnormal posture: Secondary | ICD-10-CM | POA: Diagnosis not present

## 2017-04-13 NOTE — Therapy (Signed)
[facility] [address] [place], [place], [address] Phone: [phone]   Fax:  [phone]  Physical Therapy Treatment  Patient Details  Name: Joy Martin MRN: [id number] Date of Birth: 12-12-1940 Referring Provider: Dr. Charlann Boxer  Encounter Date: 04/13/2017      PT End of Session - 04/13/17 1507    Visit Number 2   Number of Visits 12   Date for PT Re-Evaluation 05/18/17   Authorization Type U[employer] Medicare   PT Start Time 4010   PT Stop Time 1520   PT Time Calculation (min) 49 min   Activity Tolerance Patient tolerated treatment well   Behavior During Therapy [facility] for tasks assessed/performed      Past Medical History:  Diagnosis Date  . ALLERGIC RHINITIS, CHRONIC   . ANEMIA   . Arthritis    "knees mainly; thumbs"  . 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 Chronic sinusitis    follows with ENT for same  . COPD (chronic obstructive pulmonary disease) ([place]) dx 2013   GOLD 1, follows with pulm for same  . Episodic tension type headache   . EXOGENOUS OBESITY   . GERD   . HYPERLIPIDEMIA   . Hypertension   . Migraines 07/10/2012   "over; last one was @ age 60"  . OSTEOPENIA     Past Surgical History:  Procedure Laterality Date  . APPENDECTOMY  07/10/2012  . BREAST CYST ASPIRATION  ?1980's   right  . EYE SURGERY     both eyes  . KNEE ARTHROSCOPY  1970's or 1980's   right; torn cartilage  . LAPAROSCOPIC APPENDECTOMY  07/10/2012   Procedure: APPENDECTOMY LAPAROSCOPIC;  Surgeon: Harl Bowie, MD;  Location: [facility];  Service: General;  Laterality: N/A;  . MOHS SURGERY  01/2016   nose  . MOLE REMOVAL  1957   "my back"  . SKIN CANCER EXCISION     "2 on my back; 2 on my face"  . TONSILLECTOMY AND ADENOIDECTOMY  1946  . WISDOM TOOTH EXTRACTION  1959    There were no vitals filed for this visit.      Subjective Assessment - 04/13/17 1433    Subjective  reports shoulder is "about the same."  doing exercises 2-3 times a day.   Diagnostic tests arthritis, bursitis, and "no muscles"   Patient Stated Goals improve pain and functional use   Currently in Pain? Yes   Pain Score 6    Pain Location Shoulder   Pain Orientation Left   Pain Descriptors / Indicators Sharp   Pain Type Chronic pain   Pain Onset More than a month ago   Pain Frequency Intermittent   Aggravating Factors  overhead movement and abduction   Pain Relieving Factors neutral positioning                         [facility] Adult PT Treatment/Exercise - 04/13/17 1435      Neck Exercises: Seated   Neck Retraction 10 reps;3 secs     Shoulder Exercises: Supine   Horizontal ABduction Both;10 reps;Theraband   Theraband Level (Shoulder Horizontal ABduction) Level 2 (Red)   External Rotation Both;10 reps;Theraband   Theraband Level (Shoulder External Rotation) Level 2 (Red)   Flexion Left;10 reps;Weights   Shoulder Flexion Weight (lbs) 1     Shoulder Exercises: Sidelying   External Rotation Left;10 reps;Weights  External Rotation Weight (lbs) 1   ABduction Left;10 reps;Weights   ABduction Weight (lbs) 1     Shoulder Exercises: Standing   External Rotation Left;10 reps;Theraband   Theraband Level (Shoulder External Rotation) Level 2 (Red)   Internal Rotation Left;10 reps;Theraband   Theraband Level (Shoulder Internal Rotation) Level 2 (Red)   Internal Rotation Limitations min cues for technique   Retraction Both;10 reps   Retraction Limitations 5 sec hold with min cues for technique     Shoulder Exercises: Stretch   Other Shoulder Stretches low doorway stretch 2 x 30 sec     Modalities   Modalities Moist Heat;Electrical Stimulation     Moist Heat Therapy   Number Minutes Moist Heat 15 Minutes   Moist Heat Location Shoulder     Electrical Stimulation   Electrical Stimulation Location Lt shoulder   Electrical Stimulation Action IFC   Electrical  Stimulation Parameters to tolerance x 15 min   Electrical Stimulation Goals Pain     Neck Exercises: Stretches   Upper Trapezius Stretch 2 reps;30 seconds   Upper Trapezius Stretch Limitations LT   Levator Stretch 2 reps;30 seconds   Levator Stretch Limitations LT                PT Education - 04/13/17 1507    Education provided Yes   Education Details continue current HEP   Person(s) Educated Patient   Methods Explanation;Demonstration   Comprehension Verbalized understanding;Returned demonstration             PT Long Term Goals - 04/06/17 1152      PT LONG TERM GOAL #1   Title independent with HEP (05/18/17)   Time 6   Period Weeks   Status New     PT LONG TERM GOAL #2   Title verbalize understanding of posture/body mechanics to decrease risk of reinjury (05/18/17)   Time 6   Period Weeks   Status New     PT LONG TERM GOAL #3   Title perform Lt shoulder AROM without pain for improved function (05/18/17)   Time 6   Period Weeks   Status New     PT LONG TERM GOAL #4   Title demonstrate 4/5 strength Lt shoulder for improved strength and function (05/18/17)   Time 6   Period Weeks   Status New     PT LONG TERM GOAL #5   Title report 75% improvement in Lt shoulder pain with sleeping for improved function (05/18/17)   Time 6   Period Weeks   Status New               Plan - 04/13/17 1509    Clinical Impression Statement Pt reports expected muscle fatigue following strengthening exercises but tolerated well.  Followed up with estim and heat today for pain control.  Will continue to benefit from PT to maximize function.   PT Treatment/Interventions ADLs/Self Care Home Management;Cryotherapy;Electrical Stimulation;Iontophoresis 4mg /ml Dexamethasone;Moist Heat;Ultrasound;Therapeutic exercise;Therapeutic activities;Functional mobility training;Patient/family education;Manual techniques;Vasopneumatic Device;Taping;Dry needling   PT Next Visit Plan RTC  strengthening and posture exercises, modalities PRN   Consulted and Agree with Plan of Care Patient      Patient will benefit from skilled therapeutic intervention in order to improve the following deficits and impairments:  Pain, Impaired UE functional use, Increased fascial restricitons, Decreased strength, Postural dysfunction  Visit Diagnosis: Chronic left shoulder pain  Abnormal posture  Muscle weakness (generalized)     Problem List Patient Active Problem List  Diagnosis Date Noted  . Chronic shoulder bursitis, left 02/15/2017  . 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 Migraine headache 01/26/2017  . Back pain 01/26/2017  . Chronic kidney disease 01/26/2017  . Hypertensive retinopathy 11/29/2016  . Osteoarthritis of right knee 07/28/2016  . Basal cell carcinoma of skin 11/26/2015  . Abnormal CXR 01/27/2013  . Postmenopausal symptoms   . Obesity 12/25/2009  . Essential hypertension, benign 12/24/2008  . ALLERGIC RHINITIS, CHRONIC 12/24/2008  . Hyperlipidemia 12/19/2007  . ANEMIA 12/19/2007  . GERD 12/19/2007  . Osteopenia 12/19/2007      Laureen Abrahams, PT, DPT 04/13/17 3:11 PM    [facility] [place], [place], [address] Phone: [phone]   Fax:  [phone]  Name: Joy Martin MRN: [id number] Date of Birth: 04-07-1941

## 2017-04-14 ENCOUNTER — Other Ambulatory Visit: Payer: Self-pay | Admitting: Internal Medicine

## 2017-04-15 ENCOUNTER — Other Ambulatory Visit: Payer: Self-pay | Admitting: Internal Medicine

## 2017-04-20 ENCOUNTER — Ambulatory Visit (INDEPENDENT_AMBULATORY_CARE_PROVIDER_SITE_OTHER): Payer: Medicare Other | Admitting: Physical Therapy

## 2017-04-20 DIAGNOSIS — M25512 Pain in left shoulder: Secondary | ICD-10-CM | POA: Diagnosis not present

## 2017-04-20 DIAGNOSIS — R293 Abnormal posture: Secondary | ICD-10-CM | POA: Diagnosis not present

## 2017-04-20 DIAGNOSIS — M6281 Muscle weakness (generalized): Secondary | ICD-10-CM | POA: Diagnosis not present

## 2017-04-20 DIAGNOSIS — G8929 Other chronic pain: Secondary | ICD-10-CM

## 2017-04-20 NOTE — Therapy (Signed)
[facility] [address] [place], [place], [address] Phone: [phone]   Fax:  [phone]  Physical Therapy Treatment  Patient Details  Name: Joy Martin MRN: [id number] Date of Birth: [birth year] Referring Provider: Dr. Charlann Boxer  Encounter Date: 04/20/2017      PT End of Session - 04/20/17 1514    Visit Number 3   Number of Visits 12   Date for PT Re-Evaluation 05/18/17   Authorization Type UHC Medicare   PT Start Time 5176   PT Stop Time 1528   PT Time Calculation (min) 56 min   Activity Tolerance Patient tolerated treatment well   Behavior During Therapy [facility] for tasks assessed/performed      Past Medical History:  Diagnosis Date  . ALLERGIC RHINITIS, CHRONIC   . ANEMIA   . Arthritis    "knees mainly; thumbs"  . 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 Chronic sinusitis    follows with ENT for same  . COPD (chronic obstructive pulmonary disease) ([place]) dx 2013   GOLD 1, follows with pulm for same  . Episodic tension type headache   . EXOGENOUS OBESITY   . GERD   . HYPERLIPIDEMIA   . Hypertension   . Migraines 07/10/2012   "over; last one was @ age 24"  . OSTEOPENIA     Past Surgical History:  Procedure Laterality Date  . APPENDECTOMY  07/10/2012  . BREAST CYST ASPIRATION  ?1980's   right  . EYE SURGERY     both eyes  . KNEE ARTHROSCOPY  1970's or 1980's   right; torn cartilage  . LAPAROSCOPIC APPENDECTOMY  07/10/2012   Procedure: APPENDECTOMY LAPAROSCOPIC;  Surgeon: Harl Bowie, MD;  Location: [facility];  Service: General;  Laterality: N/A;  . MOHS SURGERY  01/2016   nose  . MOLE REMOVAL  1957   "my back"  . SKIN CANCER EXCISION     "2 on my back; 2 on my face"  . TONSILLECTOMY AND ADENOIDECTOMY  1946  . WISDOM TOOTH EXTRACTION  1959    There were no vitals filed for this visit.      Subjective Assessment - 04/20/17 1434    Subjective "I  think I did something I wasn't supposed to."  Reports she was performing overhead activity helping hang clothes up in a friends closet.  Reports shoulder is feeling "a little better."   Pertinent History see snapshot   Limitations House hold activities;Lifting   Diagnostic tests arthritis, bursitis, and "no muscles"   Patient Stated Goals improve pain and functional use   Currently in Pain? No/denies                         [facility] Adult PT Treatment/Exercise - 04/20/17 1436      Shoulder Exercises: Supine   Other Supine Exercises cervical retraction 10 reps x 5 sec     Shoulder Exercises: Standing   Horizontal ABduction Both;20 reps;Theraband   Theraband Level (Shoulder Horizontal ABduction) Level 2 (Red)   External Rotation Both;20 reps;Theraband   Theraband Level (Shoulder External Rotation) Level 2 (Red)   Extension Both;20 reps;Theraband   Theraband Level (Shoulder Extension) Level 2 (Red)   Other Standing Exercises UE ranger flexion and scaption 5 sec hold x 10 reps     Moist Heat Therapy   Number Minutes Moist Heat 15 Minutes   Moist Heat Location  Shoulder     Electrical Stimulation   Electrical Stimulation Location Lt shoulder   Electrical Stimulation Action IFC   Electrical Stimulation Parameters to tolerance x 15 min   Electrical Stimulation Goals Pain     Manual Therapy   Manual Therapy Soft tissue mobilization;Myofascial release;Scapular mobilization   Manual therapy comments pt supine and sidelying   Soft tissue mobilization Lt upper trap, teres minor/major and pec major/minor   Myofascial Release Lt upper trap, teres minor/major and pec major/minor   Scapular Mobilization Lt; all directions                PT Education - 04/20/17 1513    Education provided Yes   Education Details TDN-pt to consider   Person(s) Educated Patient   Methods Explanation;Handout   Comprehension Verbalized understanding             PT Long Term Goals  - 04/06/17 1152      PT LONG TERM GOAL #1   Title independent with HEP (05/18/17)   Time 6   Period Weeks   Status New     PT LONG TERM GOAL #2   Title verbalize understanding of posture/body mechanics to decrease risk of reinjury (05/18/17)   Time 6   Period Weeks   Status New     PT LONG TERM GOAL #3   Title perform Lt shoulder AROM without pain for improved function (05/18/17)   Time 6   Period Weeks   Status New     PT LONG TERM GOAL #4   Title demonstrate 4/5 strength Lt shoulder for improved strength and function (05/18/17)   Time 6   Period Weeks   Status New     PT LONG TERM GOAL #5   Title report 75% improvement in Lt shoulder pain with sleeping for improved function (05/18/17)   Time 6   Period Weeks   Status New               Plan - 04/20/17 1514    Clinical Impression Statement Pt with trigger points in teres minor, pec major and upper trap noted today with manual.  Discussed TDN and provided handout as pt may benefit to help decrease pain.  Reports she is able to tolerate more activity at home.  Will continue to benefit from PT to maximize function.   PT Treatment/Interventions ADLs/Self Care Home Management;Cryotherapy;Electrical Stimulation;Iontophoresis 4mg /ml Dexamethasone;Moist Heat;Ultrasound;Therapeutic exercise;Therapeutic activities;Functional mobility training;Patient/family education;Manual techniques;Vasopneumatic Device;Taping;Dry needling   PT Next Visit Plan RTC strengthening and posture exercises, modalities PRN, possible TDN   Consulted and Agree with Plan of Care Patient      Patient will benefit from skilled therapeutic intervention in order to improve the following deficits and impairments:  Pain, Impaired UE functional use, Increased fascial restricitons, Decreased strength, Postural dysfunction  Visit Diagnosis: Chronic left shoulder pain  Abnormal posture  Muscle weakness (generalized)     Problem List Patient Active Problem  List   Diagnosis Date Noted  . Chronic shoulder bursitis, left 02/15/2017  . 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 Migraine headache 01/26/2017  . Back pain 01/26/2017  . Chronic kidney disease 01/26/2017  . Hypertensive retinopathy 11/29/2016  . Osteoarthritis of right knee 07/28/2016  . Basal cell carcinoma of skin 11/26/2015  . Abnormal CXR 01/27/2013  . Postmenopausal symptoms   . Obesity 12/25/2009  . Essential hypertension, benign 12/24/2008  . ALLERGIC RHINITIS, CHRONIC 12/24/2008  . Hyperlipidemia 12/19/2007  . ANEMIA 12/19/2007  . GERD 12/19/2007  .  Osteopenia 12/19/2007      Laureen Abrahams, PT, DPT 04/20/17 3:16 PM    [facility] [place], [place], [address] Phone: [phone]   Fax:  [phone]  Name: Joy Martin MRN: [id number] Date of Birth: 1941/08/10

## 2017-04-20 NOTE — Patient Instructions (Signed)

## 2017-04-25 ENCOUNTER — Ambulatory Visit (INDEPENDENT_AMBULATORY_CARE_PROVIDER_SITE_OTHER): Payer: Medicare Other | Admitting: Physical Therapy

## 2017-04-25 DIAGNOSIS — M6281 Muscle weakness (generalized): Secondary | ICD-10-CM

## 2017-04-25 DIAGNOSIS — G8929 Other chronic pain: Secondary | ICD-10-CM

## 2017-04-25 DIAGNOSIS — M25512 Pain in left shoulder: Secondary | ICD-10-CM | POA: Diagnosis not present

## 2017-04-25 DIAGNOSIS — R293 Abnormal posture: Secondary | ICD-10-CM | POA: Diagnosis not present

## 2017-04-25 NOTE — Therapy (Signed)
[facility] [address] [place], [place], [address] Phone: [phone]   Fax:  [phone]  Physical Therapy Treatment  Patient Details  Name: Joy Martin MRN: [id number] Date of Birth: 1940/12/26 Referring Provider: Dr. Charlann Boxer  Encounter Date: 04/25/2017      PT End of Session - 04/25/17 1515    Visit Number 4   Number of Visits 12   Date for PT Re-Evaluation 05/18/17   Authorization Type UHC Medicare   PT Start Time 7253   PT Stop Time 1530   PT Time Calculation (min) 58 min   Activity Tolerance Patient tolerated treatment well   Behavior During Therapy [facility] for tasks assessed/performed      Past Medical History:  Diagnosis Date  . ALLERGIC RHINITIS, CHRONIC   . ANEMIA   . Arthritis    "knees mainly; thumbs"  . 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 Chronic sinusitis    follows with ENT for same  . COPD (chronic obstructive pulmonary disease) ([place]) dx 2013   GOLD 1, follows with pulm for same  . Episodic tension type headache   . EXOGENOUS OBESITY   . GERD   . HYPERLIPIDEMIA   . Hypertension   . Migraines 07/10/2012   "over; last one was @ age 11"  . OSTEOPENIA     Past Surgical History:  Procedure Laterality Date  . APPENDECTOMY  07/10/2012  . BREAST CYST ASPIRATION  ?1980's   right  . EYE SURGERY     both eyes  . KNEE ARTHROSCOPY  1970's or 1980's   right; torn cartilage  . LAPAROSCOPIC APPENDECTOMY  07/10/2012   Procedure: APPENDECTOMY LAPAROSCOPIC;  Surgeon: Harl Bowie, MD;  Location: [facility];  Service: General;  Laterality: N/A;  . MOHS SURGERY  01/2016   nose  . MOLE REMOVAL  1957   "my back"  . SKIN CANCER EXCISION     "2 on my back; 2 on my face"  . TONSILLECTOMY AND ADENOIDECTOMY  1946  . WISDOM TOOTH EXTRACTION  1959    There were no vitals filed for this visit.                       [facility] Adult PT  Treatment/Exercise - 04/25/17 0001      Shoulder Exercises: Standing   Retraction Both;10 reps   Theraband Level (Shoulder Retraction) Level 2 (Red)   Retraction Limitations 5 sec hold     Shoulder Exercises: Stretch   Other Shoulder Stretches low doorway stretch 3 x 30 sec     Moist Heat Therapy   Number Minutes Moist Heat 15 Minutes   Moist Heat Location Shoulder     Electrical Stimulation   Electrical Stimulation Location Lt shoulder   Electrical Stimulation Action IFC   Electrical Stimulation Parameters to tolerance x 15 min   Electrical Stimulation Goals Pain     Manual Therapy   Manual Therapy Soft tissue mobilization;Myofascial release   Soft tissue mobilization Lt upper trap, pec major/minor   Myofascial Release Lt upper trap; Lt pec major/minor     Neck Exercises: Stretches   Upper Trapezius Stretch 3 reps;30 seconds   Upper Trapezius Stretch Limitations Lt          Trigger Point Dry Needling - 04/25/17 1514    Consent Given? Yes   Education Handout Provided Yes  previous appointment   Muscles  Treated Upper Body Upper trapezius;Pectoralis major;Pectoralis minor   Upper Trapezius Response Twitch reponse elicited;Palpable increased muscle length   Pectoralis Major Response Twitch response elicited;Palpable increased muscle length   Pectoralis Minor Response Twitch response elicited;Palpable increased muscle length              PT Education - 04/25/17 1514    Education provided Yes   Education Details reviewed DN and what to expect during and after   Person(s) Educated Patient   Methods Explanation   Comprehension Verbalized understanding             PT Long Term Goals - 04/25/17 1515      PT LONG TERM GOAL #1   Title independent with HEP (05/18/17)   Status On-going     PT LONG TERM GOAL #2   Title verbalize understanding of posture/body mechanics to decrease risk of reinjury (05/18/17)   Status On-going     PT LONG TERM GOAL #3   Title  perform Lt shoulder AROM without pain for improved function (05/18/17)   Status On-going     PT LONG TERM GOAL #4   Title demonstrate 4/5 strength Lt shoulder for improved strength and function (05/18/17)   Status On-going     PT LONG TERM GOAL #5   Title report 75% improvement in Lt shoulder pain with sleeping for improved function (05/18/17)   Status Achieved               Plan - 04/25/17 1515    Clinical Impression Statement Pt reports at least 75% improvement in sleep and finding comfortable positions to get to sleep meeting LTG #5.  Positive response follwoing dry needling today with twitch responses in all muscle groups and pt reporting improved ability to donn shirt following.  Will continue to benefit from PT to maximize function.   PT Treatment/Interventions ADLs/Self Care Home Management;Cryotherapy;Electrical Stimulation;Iontophoresis 4mg /ml Dexamethasone;Moist Heat;Ultrasound;Therapeutic exercise;Therapeutic activities;Functional mobility training;Patient/family education;Manual techniques;Vasopneumatic Device;Taping;Dry needling   PT Next Visit Plan RTC strengthening and posture exercises, modalities PRN, assess response to DN   Consulted and Agree with Plan of Care Patient      Patient will benefit from skilled therapeutic intervention in order to improve the following deficits and impairments:  Pain, Impaired UE functional use, Increased fascial restricitons, Decreased strength, Postural dysfunction  Visit Diagnosis: Chronic left shoulder pain  Abnormal posture  Muscle weakness (generalized)     Problem List Patient Active Problem List   Diagnosis Date Noted  . Chronic shoulder bursitis, left 02/15/2017  . 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 Migraine headache 01/26/2017  . Back pain 01/26/2017  . Chronic kidney disease 01/26/2017  . Hypertensive retinopathy 11/29/2016  . Osteoarthritis of right knee 07/28/2016  . Basal cell carcinoma of skin  11/26/2015  . Abnormal CXR 01/27/2013  . Postmenopausal symptoms   . Obesity 12/25/2009  . Essential hypertension, benign 12/24/2008  . ALLERGIC RHINITIS, CHRONIC 12/24/2008  . Hyperlipidemia 12/19/2007  . ANEMIA 12/19/2007  . GERD 12/19/2007  . Osteopenia 12/19/2007      Laureen Abrahams, PT, DPT 04/25/17 3:57 PM     [facility] [place], [place], [address] Phone: [phone]   Fax:  [phone]  Name: Joy Martin MRN: [id number] Date of Birth: 1941-06-10

## 2017-04-28 ENCOUNTER — Ambulatory Visit (INDEPENDENT_AMBULATORY_CARE_PROVIDER_SITE_OTHER): Payer: Medicare Other | Admitting: Physical Therapy

## 2017-04-28 ENCOUNTER — Telehealth: Payer: Self-pay | Admitting: Physical Therapy

## 2017-04-28 DIAGNOSIS — G8929 Other chronic pain: Secondary | ICD-10-CM | POA: Diagnosis not present

## 2017-04-28 DIAGNOSIS — M6281 Muscle weakness (generalized): Secondary | ICD-10-CM | POA: Diagnosis not present

## 2017-04-28 DIAGNOSIS — M25512 Pain in left shoulder: Secondary | ICD-10-CM

## 2017-04-28 DIAGNOSIS — R293 Abnormal posture: Secondary | ICD-10-CM

## 2017-04-28 NOTE — Therapy (Signed)
[facility] [address] [place], [place], [address] Phone: [phone]   Fax:  [phone]  Physical Therapy Treatment  Patient Details  Name: Joy Martin MRN: [id number] Date of Birth: [birth year] Referring Provider: Dr. Charlann Boxer  Encounter Date: 04/28/2017      PT End of Session - 04/28/17 1601    Visit Number 5   Number of Visits 12   Date for PT Re-Evaluation 05/18/17   Authorization Type UHC Medicare   PT Start Time 1524   PT Stop Time 1612   PT Time Calculation (min) 48 min   Activity Tolerance Patient tolerated treatment well   Behavior During Therapy [facility] for tasks assessed/performed      Past Medical History:  Diagnosis Date  . ALLERGIC RHINITIS, CHRONIC   . ANEMIA   . Arthritis    "knees mainly; thumbs"  . 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 Chronic sinusitis    follows with ENT for same  . COPD (chronic obstructive pulmonary disease) ([place]) dx 2013   GOLD 1, follows with pulm for same  . Episodic tension type headache   . EXOGENOUS OBESITY   . GERD   . HYPERLIPIDEMIA   . Hypertension   . Migraines 07/10/2012   "over; last one was @ age 76"  . OSTEOPENIA     Past Surgical History:  Procedure Laterality Date  . APPENDECTOMY  07/10/2012  . BREAST CYST ASPIRATION  ?1980's   right  . EYE SURGERY     both eyes  . KNEE ARTHROSCOPY  1970's or 1980's   right; torn cartilage  . LAPAROSCOPIC APPENDECTOMY  07/10/2012   Procedure: APPENDECTOMY LAPAROSCOPIC;  Surgeon: Harl Bowie, MD;  Location: [place];  Service: General;  Laterality: N/A;  . MOHS SURGERY  01/2016   nose  . MOLE REMOVAL  1957   "my back"  . SKIN CANCER EXCISION     "2 on my back; 2 on my face"  . TONSILLECTOMY AND ADENOIDECTOMY  1946  . WISDOM TOOTH EXTRACTION  1959    There were no vitals filed for this visit.      Subjective Assessment - 04/28/17 1529    Subjective  feels like needling helped with tightness to Lt shoulder; woke up this morning with increased pain in Rt shoulder/neck.  thinks it may be due to having to lift her gate at home.   Patient Stated Goals improve pain and functional use   Currently in Pain? Yes   Pain Score 9    Pain Location Neck   Pain Orientation Right;Lateral;Posterior   Pain Descriptors / Indicators Sharp   Pain Type Acute pain   Pain Onset Yesterday   Pain Frequency Intermittent   Aggravating Factors  movement; getting up                         [facility] Adult PT Treatment/Exercise - 04/28/17 1531      Shoulder Exercises: Supine   Protraction 15 reps;Weights;Both   Protraction Weight (lbs) 4   Horizontal ABduction Both;15 reps;Theraband   Theraband Level (Shoulder Horizontal ABduction) Level 2 (Red)   External Rotation Both;15 reps;Theraband   Theraband Level (Shoulder External Rotation) Level 2 (Red)   Flexion Both;15 reps;Weights   Shoulder Flexion Weight (lbs) 2   Other Supine Exercises cervical retraction 10 reps x 5 sec     Shoulder Exercises: Sidelying  External Rotation Both;15 reps;Weights   External Rotation Weight (lbs) 2   Internal Rotation Both;15 reps;Theraband   Theraband Level (Shoulder Internal Rotation) Level 2 (Red)   ABduction Both;15 reps;Weights   ABduction Weight (lbs) 2     Modalities   Modalities Moist Heat;Electrical Stimulation     Moist Heat Therapy   Number Minutes Moist Heat 15 Minutes   Moist Heat Location Shoulder;Cervical     Electrical Stimulation   Electrical Stimulation Location bil upper trap/levator   Electrical Stimulation Action IFC   Electrical Stimulation Parameters to tolerance x 15 min   Electrical Stimulation Goals Pain                     PT Long Term Goals - 04/25/17 1515      PT LONG TERM GOAL #1   Title independent with HEP (05/18/17)   Status On-going     PT LONG TERM GOAL #2   Title verbalize understanding of  posture/body mechanics to decrease risk of reinjury (05/18/17)   Status On-going     PT LONG TERM GOAL #3   Title perform Lt shoulder AROM without pain for improved function (05/18/17)   Status On-going     PT LONG TERM GOAL #4   Title demonstrate 4/5 strength Lt shoulder for improved strength and function (05/18/17)   Status On-going     PT LONG TERM GOAL #5   Title report 75% improvement in Lt shoulder pain with sleeping for improved function (05/18/17)   Status Achieved               Plan - 04/28/17 1601    Clinical Impression Statement Pt reports that dry needling helped with pain and tightness and reported improved ability to perform ADLs following.  Pt reports increased Rt sided neck and post shoulder pain which she feels was likely due to lifting gate outside her home.  Pt with trigger points in Rt levator and upper trap.  Recommended use of heat/ice PRN and to perform stretches on both sides and see if pain subsides over next few days.  If no improvment would recommend pt discuss with MD. Will continue to benefit from PT to maximize function.  Fatigue noted with weights today.   PT Treatment/Interventions ADLs/Self Care Home Management;Cryotherapy;Electrical Stimulation;Iontophoresis 4mg /ml Dexamethasone;Moist Heat;Ultrasound;Therapeutic exercise;Therapeutic activities;Functional mobility training;Patient/family education;Manual techniques;Vasopneumatic Device;Taping;Dry needling   PT Next Visit Plan RTC strengthening and posture exercises, modalities PRN, see how Rt shoulder/neck is doing   Consulted and Agree with Plan of Care Patient      Patient will benefit from skilled therapeutic intervention in order to improve the following deficits and impairments:  Pain, Impaired UE functional use, Increased fascial restricitons, Decreased strength, Postural dysfunction  Visit Diagnosis: Chronic left shoulder pain  Abnormal posture  Muscle weakness (generalized)     Problem  List Patient Active Problem List   Diagnosis Date Noted  . Chronic shoulder bursitis, left 02/15/2017  . 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 Migraine headache 01/26/2017  . Back pain 01/26/2017  . Chronic kidney disease 01/26/2017  . Hypertensive retinopathy 11/29/2016  . Osteoarthritis of right knee 07/28/2016  . Basal cell carcinoma of skin 11/26/2015  . Abnormal CXR 01/27/2013  . Postmenopausal symptoms   . Obesity 12/25/2009  . Essential hypertension, benign 12/24/2008  . ALLERGIC RHINITIS, CHRONIC 12/24/2008  . Hyperlipidemia 12/19/2007  . ANEMIA 12/19/2007  . GERD 12/19/2007  . Osteopenia 12/19/2007      Laureen Abrahams, PT,  DPT 04/28/17 4:05 PM    [facility] [place] [place], [place], [address] Phone: [phone]   Fax:  [phone]  Name: ALISE CALAIS MRN: [id number] Date of Birth: 1941-03-20

## 2017-04-28 NOTE — Telephone Encounter (Signed)
Patient stated she was returning a call from [place]. I did not see an epic note. Call patient to advise.

## 2017-05-02 ENCOUNTER — Ambulatory Visit (INDEPENDENT_AMBULATORY_CARE_PROVIDER_SITE_OTHER): Payer: Medicare Other | Admitting: Physical Therapy

## 2017-05-02 DIAGNOSIS — G8929 Other chronic pain: Secondary | ICD-10-CM

## 2017-05-02 DIAGNOSIS — M6281 Muscle weakness (generalized): Secondary | ICD-10-CM | POA: Diagnosis not present

## 2017-05-02 DIAGNOSIS — M25512 Pain in left shoulder: Secondary | ICD-10-CM | POA: Diagnosis not present

## 2017-05-02 DIAGNOSIS — R293 Abnormal posture: Secondary | ICD-10-CM | POA: Diagnosis not present

## 2017-05-02 NOTE — Therapy (Signed)
[facility] [address] [place], [place], [address] Phone: [phone]   Fax:  [phone]  Physical Therapy Treatment  Patient Details  Name: Joy Martin MRN: [id number] Date of Birth: 1941-03-07 Referring Provider: Dr. Charlann Boxer  Encounter Date: 05/02/2017      PT End of Session - 05/02/17 1513    Visit Number 6   Number of Visits 12   Date for PT Re-Evaluation 05/18/17   Authorization Type UHC Medicare   PT Start Time 1430   PT Stop Time 1527   PT Time Calculation (min) 57 min   Activity Tolerance Patient tolerated treatment well   Behavior During Therapy [facility] for tasks assessed/performed      Past Medical History:  Diagnosis Date  . ALLERGIC RHINITIS, CHRONIC   . ANEMIA   . Arthritis    "knees mainly; thumbs"  . 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 Chronic sinusitis    follows with ENT for same  . COPD (chronic obstructive pulmonary disease) ([place]) dx 2013   GOLD 1, follows with pulm for same  . Episodic tension type headache   . EXOGENOUS OBESITY   . GERD   . HYPERLIPIDEMIA   . Hypertension   . Migraines 07/10/2012   "over; last one was @ age 49"  . OSTEOPENIA     Past Surgical History:  Procedure Laterality Date  . APPENDECTOMY  07/10/2012  . BREAST CYST ASPIRATION  ?1980's   right  . EYE SURGERY     both eyes  . KNEE ARTHROSCOPY  1970's or 1980's   right; torn cartilage  . LAPAROSCOPIC APPENDECTOMY  07/10/2012   Procedure: APPENDECTOMY LAPAROSCOPIC;  Surgeon: Harl Bowie, MD;  Location: [facility];  Service: General;  Laterality: N/A;  . MOHS SURGERY  01/2016   nose  . MOLE REMOVAL  1957   "my back"  . SKIN CANCER EXCISION     "2 on my back; 2 on my face"  . TONSILLECTOMY AND ADENOIDECTOMY  1946  . WISDOM TOOTH EXTRACTION  1959    There were no vitals filed for this visit.      Subjective Assessment - 05/02/17 1432    Subjective  Doing well; Rt side feels much better.  Feels like Lt side is feeling better.   Limitations House hold activities;Lifting   Diagnostic tests arthritis, bursitis, and "no muscles"   Patient Stated Goals improve pain and functional use   Currently in Pain? Yes   Pain Score 4    Pain Location Neck  post shoulder   Pain Orientation Left   Pain Descriptors / Indicators Sharp  "semi-sharp"   Pain Type Chronic pain   Pain Onset 1 to 4 weeks ago   Pain Frequency Intermittent   Aggravating Factors  movement   Pain Relieving Factors neutral positioning                         [facility] Adult PT Treatment/Exercise - 05/02/17 1453      Shoulder Exercises: Supine   Protraction 15 reps;Weights;Left   Protraction Weight (lbs) 4   Horizontal ABduction Both;15 reps;Theraband   Theraband Level (Shoulder Horizontal ABduction) Level 2 (Red)   External Rotation Both;15 reps;Theraband   Theraband Level (Shoulder External Rotation) Level 2 (Red)   Other Supine Exercises D1 with red theraband x 15 reps (Flexion/Extension)     Shoulder Exercises: Standing  Horizontal ABduction Left;Weights;10 reps   Horizontal ABduction Weight (lbs) 1   Horizontal ABduction Limitations with forward lean and RUE on mat table   Flexion Left;20 reps;Weights   Shoulder Flexion Weight (lbs) 1   ABduction Left;20 reps;Weights   Shoulder ABduction Weight (lbs) 1   ABduction Limitations scaption     Shoulder Exercises: ROM/Strengthening   Wall Wash flexion, horizontal abd/add, circles CW/CCW x 10 reps each with 2#   Rhythmic Stabilization, Supine 4#; Left; all directions     Moist Heat Therapy   Number Minutes Moist Heat 15 Minutes   Moist Heat Location Shoulder;Cervical     Electrical Stimulation   Electrical Stimulation Location Lt shoulder/upper trap   Electrical Stimulation Action IFC   Electrical Stimulation Parameters to tolerance x 15 min   Electrical Stimulation Goals Pain     Manual Therapy    Manual Therapy Soft tissue mobilization;Myofascial release   Soft tissue mobilization Lt Upper Trap   Myofascial Release Lt Upper Trap          Trigger Point Dry Needling - 05/02/17 1452    Consent Given? Yes   Muscles Treated Upper Body Upper trapezius   Upper Trapezius Response Twitch reponse elicited;Palpable increased muscle length              PT Education - 05/02/17 1513    Education provided Yes   Education Details home TENS unit   Person(s) Educated Patient   Methods Explanation;Handout   Comprehension Verbalized understanding             PT Long Term Goals - 04/25/17 1515      PT LONG TERM GOAL #1   Title independent with HEP (05/18/17)   Status On-going     PT LONG TERM GOAL #2   Title verbalize understanding of posture/body mechanics to decrease risk of reinjury (05/18/17)   Status On-going     PT LONG TERM GOAL #3   Title perform Lt shoulder AROM without pain for improved function (05/18/17)   Status On-going     PT LONG TERM GOAL #4   Title demonstrate 4/5 strength Lt shoulder for improved strength and function (05/18/17)   Status On-going     PT LONG TERM GOAL #5   Title report 75% improvement in Lt shoulder pain with sleeping for improved function (05/18/17)   Status Achieved               Plan - 05/02/17 1513    Clinical Impression Statement Pt continues to have active trigger points in Lt upper trap so treated with DN today with noticable decrease in muscle tightness and trigger points.  Shoulder continues to be weak, but making steady progress overall.  Will continue to benefit from PT to maximize function.   PT Treatment/Interventions ADLs/Self Care Home Management;Cryotherapy;Electrical Stimulation;Iontophoresis 4mg /ml Dexamethasone;Moist Heat;Ultrasound;Therapeutic exercise;Therapeutic activities;Functional mobility training;Patient/family education;Manual techniques;Vasopneumatic Device;Taping;Dry needling   PT Next Visit Plan  look at goals, continue strengthening and posture exercises, modalities and TDN PRN   Consulted and Agree with Plan of Care Patient      Patient will benefit from skilled therapeutic intervention in order to improve the following deficits and impairments:  Pain, Impaired UE functional use, Increased fascial restricitons, Decreased strength, Postural dysfunction  Visit Diagnosis: Chronic left shoulder pain  Abnormal posture  Muscle weakness (generalized)     Problem List Patient Active Problem List   Diagnosis Date Noted  . Chronic shoulder bursitis, left 02/15/2017  . AC (acromioclavicular) arthritis  02/15/2017  . Migraine headache 01/26/2017  . Back pain 01/26/2017  . Chronic kidney disease 01/26/2017  . Hypertensive retinopathy 11/29/2016  . Osteoarthritis of right knee 07/28/2016  . Basal cell carcinoma of skin 11/26/2015  . Abnormal CXR 01/27/2013  . Postmenopausal symptoms   . Obesity 12/25/2009  . Essential hypertension, benign 12/24/2008  . ALLERGIC RHINITIS, CHRONIC 12/24/2008  . Hyperlipidemia 12/19/2007  . ANEMIA 12/19/2007  . GERD 12/19/2007  . Osteopenia 12/19/2007      Laureen Abrahams, PT, DPT 05/02/17 3:16 PM    [facility] [place], [place], [address] Phone: [phone]   Fax:  [phone]  Name: Joy Martin MRN: [id number] Date of Birth: 23-Jul-1941

## 2017-05-02 NOTE — Patient Instructions (Signed)

## 2017-05-04 ENCOUNTER — Encounter: Payer: Self-pay | Admitting: Family Medicine

## 2017-05-04 ENCOUNTER — Ambulatory Visit (INDEPENDENT_AMBULATORY_CARE_PROVIDER_SITE_OTHER): Payer: Medicare Other | Admitting: Family Medicine

## 2017-05-04 DIAGNOSIS — M4003 Postural kyphosis, cervicothoracic region: Secondary | ICD-10-CM | POA: Diagnosis not present

## 2017-05-04 DIAGNOSIS — M40209 Unspecified kyphosis, site unspecified: Secondary | ICD-10-CM | POA: Insufficient documentation

## 2017-05-04 DIAGNOSIS — M19012 Primary osteoarthritis, left shoulder: Secondary | ICD-10-CM | POA: Diagnosis not present

## 2017-05-04 DIAGNOSIS — M7552 Bursitis of left shoulder: Secondary | ICD-10-CM | POA: Diagnosis not present

## 2017-05-04 NOTE — Assessment & Plan Note (Signed)
Discussed posture and ergonomics.

## 2017-05-04 NOTE — Assessment & Plan Note (Signed)
Patient does have some tenderness. Discussed icing regimen. Patient will continue to stay active. Worsening symptoms we'll consider injection. Patient come back as needed

## 2017-05-04 NOTE — Assessment & Plan Note (Signed)
Likely causing some of the limited range of motion. We'll continue to monitor.

## 2017-05-04 NOTE — Patient Instructions (Signed)
Good to see you  You are doing great  Keep being active.  Keep hands within peripheral vision.  pennsaid pinkie amount topically 2 times daily as needed.   See me again in 6-8 weeks if still in same amount of pain or worse.

## 2017-05-04 NOTE — Progress Notes (Signed)
[facility] [place], [place] [address] Phone: [phone] Subjective:    I'm seeing this patient by the request  of:  [name], MD   CC: Left shoulder pain f/u   [id number]  Joy Martin is a 76 y.o. female coming in with complaint of left shoulder pain. Seems to be more of the left shoulder and posterior aspect. Patient was seen by me and did have more of a mild bursitis as well as acromioclavicular arthritis. Patient was doing home exercises. States that she is 60% better. Still affecting certain daily activities such as over the head lifting. Patient states also certain movements and when dressings still causes the pain. Denies any new symptoms.     Past Medical History:  Diagnosis Date  . ALLERGIC RHINITIS, CHRONIC   . ANEMIA   . Arthritis    "knees mainly; thumbs"  . 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 Chronic sinusitis    follows with ENT for same  . COPD (chronic obstructive pulmonary disease) ([place]) dx 2013   GOLD 1, follows with pulm for same  . Episodic tension type headache   . EXOGENOUS OBESITY   . GERD   . HYPERLIPIDEMIA   . Hypertension   . Migraines 07/10/2012   "over; last one was @ age 34"  . OSTEOPENIA    Past Surgical History:  Procedure Laterality Date  . APPENDECTOMY  07/10/2012  . BREAST CYST ASPIRATION  ?1980's   right  . EYE SURGERY     both eyes  . KNEE ARTHROSCOPY  1970's or 1980's   right; torn cartilage  . LAPAROSCOPIC APPENDECTOMY  07/10/2012   Procedure: APPENDECTOMY LAPAROSCOPIC;  Surgeon: Harl Bowie, MD;  Location: [facility];  Service: General;  Laterality: N/A;  . MOHS SURGERY  01/2016   nose  . MOLE REMOVAL  1957   "my back"  . SKIN CANCER EXCISION     "2 on my back; 2 on my face"  . TONSILLECTOMY AND ADENOIDECTOMY  1946  . [place] EXTRACTION  1959   Social History   Social History  . Marital status: Single      Spouse name: N/A  . Number of children: 0  . Years of education: 74   Occupational History  .      retired Pharmacist, hospital, 5th grade   Social History Main Topics  . Smoking status: Former Smoker    Packs/day: 0.75    Years: 20.00    Types: Cigarettes    Quit date: 07/25/1982  . Smokeless tobacco: Never Used     Comment: 07/10/2012 "stopped smoking cigarettes 1980's"  . Alcohol use Yes     Comment: 07/10/2012 "glass of wine 1-2X/yr"  . Drug use: No  . Sexual activity: No   Other Topics Concern  . 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 Allegra [Fexofenadine Hcl] Other (See Comments)    Headache, "took the pill; I got flu symptoms"   Family History  Problem Relation Age of Onset  . Prostate cancer Father   . Cancer Father        prostate  . Cancer Mother        waldenstruns microgobular anemia  . Alcohol abuse Other   . Heart disease Other   .  Hypertension Other   . Diabetes Other   . Pancreatic cancer Other   . Cancer Maternal Grandfather        pancreatic    Past medical history, social, surgical and family history all reviewed in electronic medical record.  No pertanent information unless stated regarding to the chief complaint.   Review of Systems: No headache, visual changes, nausea, vomiting, diarrhea, constipation, dizziness, abdominal pain, skin rash, fevers, chills, night sweats, weight loss, swollen lymph nodes, body aches, joint swelling, chest pain, shortness of breath, mood changes.  Positive muscle aches   Objective  Blood pressure 110/60, pulse 68, height 5\' 1"  (1.549 m), weight 167 lb (75.8 kg), SpO2 98 %.  Systems examined below as of 05/04/17 General: NAD A&O x3 mood, affect normal  HEENT: Pupils equal, extraocular movements intact no nystagmus Respiratory: not short of breath at rest or with speaking Cardiovascular: No lower extremity edema, non  tender Skin: Warm dry intact with no signs of infection or rash on extremities or on axial skeleton. Abdomen: Soft nontender, no masses Neuro: Cranial nerves  intact, neurovascularly intact in all extremities with 2+ DTRs and 2+ pulses. Lymph: No lymphadenopathy appreciated today  Gait normal with good balance and coordination.  MSK: Non tender with full range of motion and good stability and symmetric strength and tone of elbows, wrist,  knee hips and ankles bilaterally.  Significant arthritic changes Shoulder: Left Inspection reveals kyphosis. Patient does have some diffuse tenderness to palpation in the shoulder Patient does have some mild limitation with external range of motion Rotator cuff strength 4 out of 5 compared to contralateral sign Positive impingement Speeds and Yergason's tests normal. No labral pathology noted with negative Obrien's, negative clunk and good stability. Normal scapular function observed. No painful arc and no drop arm sign. No apprehension sign Contralateral shoulder does have some arthritic changes but full range of motion and full strength.       Impression and Recommendations:     This case required medical decision making of moderate complexity.      Note: This dictation was prepared with Dragon dictation along with smaller phrase technology. Any transcriptional errors that result from this process are unintentional.

## 2017-05-05 ENCOUNTER — Ambulatory Visit (INDEPENDENT_AMBULATORY_CARE_PROVIDER_SITE_OTHER): Payer: Medicare Other | Admitting: Physical Therapy

## 2017-05-05 DIAGNOSIS — R293 Abnormal posture: Secondary | ICD-10-CM

## 2017-05-05 DIAGNOSIS — M6281 Muscle weakness (generalized): Secondary | ICD-10-CM | POA: Diagnosis not present

## 2017-05-05 DIAGNOSIS — G8929 Other chronic pain: Secondary | ICD-10-CM

## 2017-05-05 DIAGNOSIS — M25512 Pain in left shoulder: Secondary | ICD-10-CM | POA: Diagnosis not present

## 2017-05-05 NOTE — Therapy (Signed)
[facility] [address] [place], [place], [address] Phone: [phone]   Fax:  [phone]  Physical Therapy Treatment  Patient Details  Name: Joy Martin MRN: [id number] Date of Birth: 07/18/1941 Referring Provider: Dr. Charlann Boxer  Encounter Date: 05/05/2017      PT End of Session - 05/05/17 1556    Visit Number 7   Number of Visits 12   Date for PT Re-Evaluation 05/18/17   Authorization Type UHC Medicare   PT Start Time 7371   PT Stop Time 1610   PT Time Calculation (min) 54 min   Activity Tolerance Patient tolerated treatment well   Behavior During Therapy [facility] for tasks assessed/performed      Past Medical History:  Diagnosis Date  . ALLERGIC RHINITIS, CHRONIC   . ANEMIA   . Arthritis    "knees mainly; thumbs"  . 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 Chronic sinusitis    follows with ENT for same  . COPD (chronic obstructive pulmonary disease) ([place]) dx 2013   GOLD 1, follows with pulm for same  . Episodic tension type headache   . EXOGENOUS OBESITY   . GERD   . HYPERLIPIDEMIA   . Hypertension   . Migraines 07/10/2012   "over; last one was @ age 11"  . OSTEOPENIA     Past Surgical History:  Procedure Laterality Date  . APPENDECTOMY  07/10/2012  . BREAST CYST ASPIRATION  ?1980's   right  . EYE SURGERY     both eyes  . KNEE ARTHROSCOPY  1970's or 1980's   right; torn cartilage  . LAPAROSCOPIC APPENDECTOMY  07/10/2012   Procedure: APPENDECTOMY LAPAROSCOPIC;  Surgeon: Harl Bowie, MD;  Location: [facility];  Service: General;  Laterality: N/A;  . MOHS SURGERY  01/2016   nose  . MOLE REMOVAL  1957   "my back"  . SKIN CANCER EXCISION     "2 on my back; 2 on my face"  . TONSILLECTOMY AND ADENOIDECTOMY  1946  . WISDOM TOOTH EXTRACTION  1959    There were no vitals filed for this visit.      Subjective Assessment - 05/05/17 1519    Subjective Lt  shoulder is a little more sore today but can't think of anything that made it worse.  Went to MD, he's pleased so far.  Doesn't have to return unless pain gets worse.   Diagnostic tests arthritis, bursitis, and "no muscles"   Patient Stated Goals improve pain and functional use   Currently in Pain? Yes   Pain Score 4    Pain Location Shoulder   Pain Orientation Left   Pain Descriptors / Indicators Sore   Pain Type Chronic pain   Pain Onset More than a month ago   Pain Frequency Intermittent   Aggravating Factors  movement   Pain Relieving Factors neutral positioning                         [facility] Adult PT Treatment/Exercise - 05/05/17 1523      Shoulder Exercises: Prone   Retraction Left;15 reps;Weights   Retraction Weight (lbs) 3   Extension Left;15 reps;Weights   Extension Weight (lbs) 3   Horizontal ABduction 1 Left;15 reps;Weights   Horizontal ABduction 1 Weight (lbs) 2     Shoulder Exercises: Standing   Horizontal ABduction Both;15 reps;Theraband   Theraband Level (Shoulder  Horizontal ABduction) Level 3 (Green)   Retraction Both;15 reps;Theraband   Theraband Level (Shoulder Retraction) Level 3 (Green)   Retraction Limitations 3 sec hold     Moist Heat Therapy   Number Minutes Moist Heat 15 Minutes   Moist Heat Location Shoulder;Cervical     Electrical Stimulation   Electrical Stimulation Location Lt shoulder/upper trap   Electrical Stimulation Action IFC   Electrical Stimulation Parameters to tolerance x 15 min   Electrical Stimulation Goals Pain     Manual Therapy   Manual Therapy Myofascial release;Soft tissue mobilization   Soft tissue mobilization Lt Upper Trap   Myofascial Release Lt Upper Trap                     PT Long Term Goals - 04/25/17 1515      PT LONG TERM GOAL #1   Title independent with HEP (05/18/17)   Status On-going     PT LONG TERM GOAL #2   Title verbalize understanding of posture/body mechanics to decrease  risk of reinjury (05/18/17)   Status On-going     PT LONG TERM GOAL #3   Title perform Lt shoulder AROM without pain for improved function (05/18/17)   Status On-going     PT LONG TERM GOAL #4   Title demonstrate 4/5 strength Lt shoulder for improved strength and function (05/18/17)   Status On-going     PT LONG TERM GOAL #5   Title report 75% improvement in Lt shoulder pain with sleeping for improved function (05/18/17)   Status Achieved               Plan - 05/05/17 1556    Clinical Impression Statement Pt progressing well with strengthening exercises, and continues to have some mild pain in Lt shoulder.  Will continue to benefit from PT to strengthen Lt shoulder and maximize function.   PT Treatment/Interventions ADLs/Self Care Home Management;Cryotherapy;Electrical Stimulation;Iontophoresis 4mg /ml Dexamethasone;Moist Heat;Ultrasound;Therapeutic exercise;Therapeutic activities;Functional mobility training;Patient/family education;Manual techniques;Vasopneumatic Device;Taping;Dry needling   PT Next Visit Plan continue strengthening and posture exercises, modalities and TDN PRN   Consulted and Agree with Plan of Care Patient      Patient will benefit from skilled therapeutic intervention in order to improve the following deficits and impairments:  Decreased endurance  Visit Diagnosis: Chronic left shoulder pain  Abnormal posture  Muscle weakness (generalized)     Problem List Patient Active Problem List   Diagnosis Date Noted  . Kyphosis 05/04/2017  . Chronic shoulder bursitis, left 02/15/2017  . 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 Migraine headache 01/26/2017  . Back pain 01/26/2017  . Chronic kidney disease 01/26/2017  . Hypertensive retinopathy 11/29/2016  . Osteoarthritis of right knee 07/28/2016  . Basal cell carcinoma of skin 11/26/2015  . Abnormal CXR 01/27/2013  . Postmenopausal symptoms   . Obesity 12/25/2009  . Essential hypertension,  benign 12/24/2008  . ALLERGIC RHINITIS, CHRONIC 12/24/2008  . Hyperlipidemia 12/19/2007  . ANEMIA 12/19/2007  . GERD 12/19/2007  . Osteopenia 12/19/2007      Laureen Abrahams, PT, DPT 05/05/17 3:58 PM    [facility] [place], [place], [address] Phone: [phone]   Fax:  [phone]  Name: Joy Martin MRN: [id number] Date of Birth: 1941/02/15

## 2017-05-09 ENCOUNTER — Ambulatory Visit (INDEPENDENT_AMBULATORY_CARE_PROVIDER_SITE_OTHER): Payer: Medicare Other | Admitting: Physical Therapy

## 2017-05-09 DIAGNOSIS — G8929 Other chronic pain: Secondary | ICD-10-CM

## 2017-05-09 DIAGNOSIS — M25512 Pain in left shoulder: Secondary | ICD-10-CM | POA: Diagnosis not present

## 2017-05-09 DIAGNOSIS — R293 Abnormal posture: Secondary | ICD-10-CM | POA: Diagnosis not present

## 2017-05-09 DIAGNOSIS — M6281 Muscle weakness (generalized): Secondary | ICD-10-CM | POA: Diagnosis not present

## 2017-05-09 NOTE — Therapy (Signed)
[facility] [address] [place], [place], [address] Phone: [phone]   Fax:  [phone]  Physical Therapy Treatment  Patient Details  Name: Joy Martin MRN: [id number] Date of Birth: 1941-07-04 Referring Provider: Dr. Charlann Boxer  Encounter Date: 05/09/2017      PT End of Session - 05/09/17 1550    Visit Number 8   Number of Visits 12   Date for PT Re-Evaluation 05/18/17   Authorization Type UHC Medicare   PT Start Time 1523   PT Stop Time 1602   PT Time Calculation (min) 39 min   Activity Tolerance Patient tolerated treatment well   Behavior During Therapy [facility] for tasks assessed/performed      Past Medical History:  Diagnosis Date  . ALLERGIC RHINITIS, CHRONIC   . ANEMIA   . Arthritis    "knees mainly; thumbs"  . 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 Chronic sinusitis    follows with ENT for same  . COPD (chronic obstructive pulmonary disease) ([place]) dx 2013   GOLD 1, follows with pulm for same  . Episodic tension type headache   . EXOGENOUS OBESITY   . GERD   . HYPERLIPIDEMIA   . Hypertension   . Migraines 07/10/2012   "over; last one was @ age 47"  . OSTEOPENIA     Past Surgical History:  Procedure Laterality Date  . APPENDECTOMY  07/10/2012  . BREAST CYST ASPIRATION  ?1980's   right  . EYE SURGERY     both eyes  . KNEE ARTHROSCOPY  1970's or 1980's   right; torn cartilage  . LAPAROSCOPIC APPENDECTOMY  07/10/2012   Procedure: APPENDECTOMY LAPAROSCOPIC;  Surgeon: Harl Bowie, MD;  Location: [facility];  Service: General;  Laterality: N/A;  . MOHS SURGERY  01/2016   nose  . MOLE REMOVAL  1957   "my back"  . SKIN CANCER EXCISION     "2 on my back; 2 on my face"  . TONSILLECTOMY AND ADENOIDECTOMY  1946  . WISDOM TOOTH EXTRACTION  1959    There were no vitals filed for this visit.      Subjective Assessment - 05/09/17 1523    Subjective Lt  shoulder is doing well.  still has some pain but better than it was.     Diagnostic tests arthritis, bursitis, and "no muscles"   Patient Stated Goals improve pain and functional use                         [facility] Adult PT Treatment/Exercise - 05/09/17 0001      Self-Care   Self-Care Other Self-Care Comments   Other Self-Care Comments  discussed current progress; POC with plan to d/c in next 1-2 sessions and pt to continue HEP.  Educated on proper electrode placement for home TENS unit     Shoulder Exercises: Therapy Ball   Flexion 5 reps   Flexion Limitations seated for shoulder flexion and mid back stretch x 20 sec hold     Shoulder Exercises: Stretch   Other Shoulder Stretches lower trunk rotation for mid thoracic stretch 3x20 sec bil     Moist Heat Therapy   Number Minutes Moist Heat 15 Minutes   Moist Heat Location Shoulder     Electrical Stimulation   Electrical Stimulation Location Lt shoulder/upper trap   Electrical Stimulation Action IFC   Electrical Stimulation Parameters  to tolerance x 13 min   Electrical Stimulation Goals Pain                PT Education - 05/09/17 1549    Education provided Yes   Education Details see self care   Person(s) Educated Patient   Methods Explanation;Handout   Comprehension Verbalized understanding             PT Long Term Goals - 04/25/17 1515      PT LONG TERM GOAL #1   Title independent with HEP (05/18/17)   Status On-going     PT LONG TERM GOAL #2   Title verbalize understanding of posture/body mechanics to decrease risk of reinjury (05/18/17)   Status On-going     PT LONG TERM GOAL #3   Title perform Lt shoulder AROM without pain for improved function (05/18/17)   Status On-going     PT LONG TERM GOAL #4   Title demonstrate 4/5 strength Lt shoulder for improved strength and function (05/18/17)   Status On-going     PT LONG TERM GOAL #5   Title report 75% improvement in Lt shoulder pain  with sleeping for improved function (05/18/17)   Status Achieved               Plan - 05/09/17 1550    Clinical Impression Statement Session today focused on stretches to help with mid thoracic paraspinal tightness and discssion on home TENS unit and current POC.  Anticipate pt will be ready to d/c next 1-2 visits.   PT Treatment/Interventions ADLs/Self Care Home Management;Cryotherapy;Electrical Stimulation;Iontophoresis 4mg /ml Dexamethasone;Moist Heat;Ultrasound;Therapeutic exercise;Therapeutic activities;Functional mobility training;Patient/family education;Manual techniques;Vasopneumatic Device;Taping;Dry needling   PT Next Visit Plan begin looking at goals; anticipate d/c next 1-2 sessions   Consulted and Agree with Plan of Care Patient      Patient will benefit from skilled therapeutic intervention in order to improve the following deficits and impairments:  Pain, Decreased strength, Postural dysfunction, Decreased range of motion, Increased fascial restricitons, Increased muscle spasms, Impaired UE functional use  Visit Diagnosis: Chronic left shoulder pain  Abnormal posture  Muscle weakness (generalized)     Problem List Patient Active Problem List   Diagnosis Date Noted  . Kyphosis 05/04/2017  . Chronic shoulder bursitis, left 02/15/2017  . 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 Migraine headache 01/26/2017  . Back pain 01/26/2017  . Chronic kidney disease 01/26/2017  . Hypertensive retinopathy 11/29/2016  . Osteoarthritis of right knee 07/28/2016  . Basal cell carcinoma of skin 11/26/2015  . Abnormal CXR 01/27/2013  . Postmenopausal symptoms   . Obesity 12/25/2009  . Essential hypertension, benign 12/24/2008  . ALLERGIC RHINITIS, CHRONIC 12/24/2008  . Hyperlipidemia 12/19/2007  . ANEMIA 12/19/2007  . GERD 12/19/2007  . Osteopenia 12/19/2007      Laureen Abrahams, PT, DPT 05/09/17 3:53 PM    [place], [place], [address] Phone: [phone]   Fax:  [phone]  Name: Joy Martin MRN: [id number] Date of Birth: October 09, 1941

## 2017-05-12 ENCOUNTER — Ambulatory Visit (INDEPENDENT_AMBULATORY_CARE_PROVIDER_SITE_OTHER): Payer: Medicare Other | Admitting: Physical Therapy

## 2017-05-12 DIAGNOSIS — M25512 Pain in left shoulder: Secondary | ICD-10-CM

## 2017-05-12 DIAGNOSIS — R293 Abnormal posture: Secondary | ICD-10-CM | POA: Diagnosis not present

## 2017-05-12 DIAGNOSIS — M6281 Muscle weakness (generalized): Secondary | ICD-10-CM

## 2017-05-12 DIAGNOSIS — G8929 Other chronic pain: Secondary | ICD-10-CM | POA: Diagnosis not present

## 2017-05-12 NOTE — Patient Instructions (Addendum)
Posture Tips DO: - stand tall and erect - keep chin tucked in - keep head and shoulders in alignment - check posture regularly in mirror or large window - pull head back against headrest in car seat;  Change your position often.  Sit with lumbar support. DON'T: - slouch or slump while watching TV or reading - sit, stand or lie in one position  for too long;  Sitting is especially hard on the spine so if you sit at a desk/use the computer, then stand up often!   Copyright  VHI. All rights reserved.  Posture - Standing   Good posture is important. Avoid slouching and forward head thrust. Maintain curve in low back and align ears over shoul- ders, hips over ankles.  Pull your belly button in toward your back bone.   Copyright  VHI. All rights reserved.  Posture - Sitting   Sit upright, head facing forward. Try using a roll to support lower back. Keep shoulders relaxed, and avoid rounded back. Keep hips level with knees. Avoid crossing legs for long periods.   Copyright  VHI. All rights reserved.     Access Code: [email or web address]  URL: [email or web address]  Date: 05/12/2017  Prepared by: Faustino Congress, PT, DPT   Exercises  Hooklying Single Knee to Chest - 2-3 reps - 1 sets - 30 sec hold - 1x daily - 7x weekly  Supine Lower Trunk Rotation - 2-3 reps - 1 sets - 30 sec hold - 1x daily - 7x weekly  Supine Piriformis Stretch - 2-3 reps - 1 sets - 30 sec hold - 1x daily - 7x weekly  Seated Hamstring Stretch - 2-3 reps - 1 sets - 30 sec hold - 1x daily - 7x weekly

## 2017-05-12 NOTE — Therapy (Signed)
[facility] [address] [place], [place], [address] Phone: [phone]   Fax:  [phone]  Physical Therapy Treatment  Patient Details  Name: Joy Martin MRN: [id number] Date of Birth: 09-06-1941 Referring Provider: Dr. Charlann Boxer  Encounter Date: 05/12/2017      PT End of Session - 05/12/17 1554    Visit Number 9   Number of Visits 12   Date for PT Re-Evaluation 05/18/17   Authorization Type UHC Medicare   PT Start Time 1520   PT Stop Time 1607   PT Time Calculation (min) 47 min   Activity Tolerance Patient tolerated treatment well   Behavior During Therapy [facility] for tasks assessed/performed      Past Medical History:  Diagnosis Date  . ALLERGIC RHINITIS, CHRONIC   . ANEMIA   . Arthritis    "knees mainly; thumbs"  . 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 Chronic sinusitis    follows with ENT for same  . COPD (chronic obstructive pulmonary disease) ([facility]) dx 2013   GOLD 1, follows with pulm for same  . Episodic tension type headache   . EXOGENOUS OBESITY   . GERD   . HYPERLIPIDEMIA   . Hypertension   . Migraines 07/10/2012   "over; last one was @ age 33"  . OSTEOPENIA     Past Surgical History:  Procedure Laterality Date  . APPENDECTOMY  07/10/2012  . BREAST CYST ASPIRATION  ?1980's   right  . EYE SURGERY     both eyes  . KNEE ARTHROSCOPY  1970's or 1980's   right; torn cartilage  . LAPAROSCOPIC APPENDECTOMY  07/10/2012   Procedure: APPENDECTOMY LAPAROSCOPIC;  Surgeon: Harl Bowie, MD;  Location: [facility];  Service: General;  Laterality: N/A;  . MOHS SURGERY  01/2016   nose  . MOLE REMOVAL  1957   "my back"  . SKIN CANCER EXCISION     "2 on my back; 2 on my face"  . TONSILLECTOMY AND ADENOIDECTOMY  1946  . WISDOM TOOTH EXTRACTION  1959    There were no vitals filed for this visit.      Subjective Assessment - 05/12/17 1521    Subjective  hasn't been great about doing exercises. "it's probably not as great as it should be."   Diagnostic tests arthritis, bursitis, and "no muscles"   Patient Stated Goals improve pain and functional use   Pain Score 0-No pain            [facility] PT Assessment - 05/12/17 1549      AROM   Overall AROM Comments c/o 1/10 pain with flexion and abduction Lt shoulder                     [facility] Adult PT Treatment/Exercise - 05/12/17 1526      Self-Care   Self-Care Other Self-Care Comments   Other Self-Care Comments  educated on posture and repositioning throughout the day     Exercises   Exercises Lumbar     Lumbar Exercises: Stretches   Passive Hamstring Stretch 2 reps;30 seconds   Single Knee to Chest Stretch 2 reps;30 seconds   Lower Trunk Rotation 3 reps;30 seconds   Piriformis Stretch 2 reps;30 seconds     Moist Heat Therapy   Number Minutes Moist Heat 15 Minutes   Moist Heat Location Shoulder     Electrical Stimulation   Electrical  Stimulation Location Lt shoulder/upper trap   Electrical Stimulation Action IF   Electrical Stimulation Parameters to tolerance x 15 min   Electrical Stimulation Goals Pain                PT Education - 05/12/17 1554    Education provided Yes   Education Details low back HEP and posture   Person(s) Educated Patient   Methods Explanation;Demonstration;Handout   Comprehension Verbalized understanding;Returned demonstration             PT Long Term Goals - 05/12/17 1554      PT LONG TERM GOAL #1   Title independent with HEP (05/18/17)   Status On-going     PT LONG TERM GOAL #2   Title verbalize understanding of posture/body mechanics to decrease risk of reinjury (05/18/17)   Status Achieved     PT LONG TERM GOAL #3   Title perform Lt shoulder AROM without pain for improved function (05/18/17)   Status Partially Met     PT LONG TERM GOAL #4   Title demonstrate 4/5 strength Lt shoulder for improved strength and  function (05/18/17)   Status On-going     PT LONG TERM GOAL #5   Title report 75% improvement in Lt shoulder pain with sleeping for improved function (05/18/17)   Status Achieved               Plan - 05/12/17 1555    Clinical Impression Statement Pt has progressed well with PT and anticipate d/c from PT next visit.  Will plan to address remaining goals.  ROM goal partially met at pt c/o 1/10 pain with flexion and abduction.  Flexibility HEP issued for low back today to help with posture and decreasing stress on neck and shoulders.   PT Treatment/Interventions ADLs/Self Care Home Management;Cryotherapy;Electrical Stimulation;Iontophoresis 50m/ml Dexamethasone;Moist Heat;Ultrasound;Therapeutic exercise;Therapeutic activities;Functional mobility training;Patient/family education;Manual techniques;Vasopneumatic Device;Taping;Dry needling   PT Next Visit Plan d/c next visit, g code   Consulted and Agree with Plan of Care Patient      Patient will benefit from skilled therapeutic intervention in order to improve the following deficits and impairments:  Pain, Decreased strength, Postural dysfunction, Decreased range of motion, Increased fascial restricitons, Increased muscle spasms, Impaired UE functional use  Visit Diagnosis: Chronic left shoulder pain  Abnormal posture  Muscle weakness (generalized)     Problem List Patient Active Problem List   Diagnosis Date Noted  . Kyphosis 05/04/2017  . Chronic shoulder bursitis, left 02/15/2017  . 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 Migraine headache 01/26/2017  . Back pain 01/26/2017  . Chronic kidney disease 01/26/2017  . Hypertensive retinopathy 11/29/2016  . Osteoarthritis of right knee 07/28/2016  . Basal cell carcinoma of skin 11/26/2015  . Abnormal CXR 01/27/2013  . Postmenopausal symptoms   . Obesity 12/25/2009  . Essential hypertension, benign 12/24/2008  . ALLERGIC RHINITIS, CHRONIC 12/24/2008  . Hyperlipidemia  12/19/2007  . ANEMIA 12/19/2007  . GERD 12/19/2007  . Osteopenia 12/19/2007      SLaureen Abrahams PT, DPT 05/12/17 3:58 PM    [place] [address]Phone: [phone]  Fax:  [phone] Name: Joy SHADERMRN: [id number] of Birth: 4Jan 29, 1942

## 2017-05-16 ENCOUNTER — Ambulatory Visit (INDEPENDENT_AMBULATORY_CARE_PROVIDER_SITE_OTHER): Payer: Medicare Other | Admitting: Physical Therapy

## 2017-05-16 DIAGNOSIS — R293 Abnormal posture: Secondary | ICD-10-CM

## 2017-05-16 DIAGNOSIS — M6281 Muscle weakness (generalized): Secondary | ICD-10-CM | POA: Diagnosis not present

## 2017-05-16 DIAGNOSIS — M25512 Pain in left shoulder: Secondary | ICD-10-CM | POA: Diagnosis not present

## 2017-05-16 DIAGNOSIS — G8929 Other chronic pain: Secondary | ICD-10-CM | POA: Diagnosis not present

## 2017-05-16 NOTE — Patient Instructions (Signed)
Access Code: [email or web address]  URL: [email or web address]  Date: 05/16/2017  Prepared by: Faustino Congress, PT, DPT  Exercises  Supine Shoulder Horizontal Abduction with Resistance - 15 reps - 1 sets - 1x daily - 7x weekly  Supine Shoulder External Rotation with Resistance - 15 reps - 1 sets - 1x daily - 7x weekly  Supine Shoulder Flexion with Resistance - 15 reps - 1 sets - 1x daily - 7x weekly

## 2017-05-16 NOTE — Therapy (Signed)
[place], [place], [address] Phone: [phone]   Fax:  [phone]  Physical Therapy Treatment/Discharge  Patient Details  Name: Joy Martin MRN: [id number] Date of Birth: Aug 29, 1941 Referring Provider: Dr. Charlann Boxer  Encounter Date: 05/16/2017      PT End of Session - 05/16/17 1502    Visit Number [place] [employer]   PT Start Time 1430   PT Stop Time 1515   PT Time Calculation (min) 45 min   Activity Tolerance Patient tolerated treatment well   Behavior During Therapy [facility] for tasks assessed/performed      Past Medical History:  Diagnosis Date  . ALLERGIC RHINITIS, CHRONIC   . ANEMIA   . Arthritis    "knees mainly; thumbs"  . 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 Chronic sinusitis    follows with ENT for same  . COPD (chronic obstructive pulmonary disease) ([place]) dx 2013   GOLD 1, follows with pulm for same  . Episodic tension type headache   . EXOGENOUS OBESITY   . GERD   . HYPERLIPIDEMIA   . Hypertension   . Migraines 07/10/2012   "over; last one was @ age 22"  . OSTEOPENIA     Past Surgical History:  Procedure Laterality Date  . APPENDECTOMY  07/10/2012  . BREAST CYST ASPIRATION  ?1980's   right  . EYE SURGERY     both eyes  . KNEE ARTHROSCOPY  1970's or 1980's   right; torn cartilage  . LAPAROSCOPIC APPENDECTOMY  07/10/2012   Procedure: APPENDECTOMY LAPAROSCOPIC;  Surgeon: Harl Bowie, MD;  Location: [facility];  Service: General;  Laterality: N/A;  . MOHS SURGERY  01/2016   nose  . MOLE REMOVAL  1957   "my back"  . SKIN CANCER EXCISION     "2 on my back; 2 on my face"  . TONSILLECTOMY AND ADENOIDECTOMY  1946  . WISDOM TOOTH EXTRACTION  1959    There were no vitals filed for this visit.      Subjective Assessment - 05/16/17 1431    Subjective doing pretty well; ready to graduate.   Diagnostic  tests arthritis, bursitis, and "no muscles"   Patient Stated Goals improve pain and functional use   Pain Score 0-No pain  up to 3/10 with activity   Aggravating Factors  movement   Pain Relieving Factors neutral positioning            [facility] PT Assessment - 05/16/17 1434      Assessment   Medical Diagnosis Lt shoulder pain   Referring Provider Dr. Charlann Boxer   Hand Dominance Right     Strength   Left Shoulder Flexion 4+/5   Left Shoulder Extension 5/5   Left Shoulder ABduction 4/5   Left Shoulder Internal Rotation 5/5   Left Shoulder External Rotation 4/5   Left Elbow Flexion 5/5   Left Elbow Extension 5/5                     [facility] Adult PT Treatment/Exercise - 05/16/17 0001      Shoulder Exercises: Supine   Horizontal ABduction Both;15 reps;Theraband   Theraband Level (Shoulder Horizontal ABduction) Level 3 (Green)   External Rotation Both;15 reps;Theraband   Theraband Level (Shoulder External Rotation) Level 3 (Green)   Flexion Both;15 reps;Theraband   Theraband Level (Shoulder Flexion) Level 3 (Green)  Shoulder Exercises: Stretch   Other Shoulder Stretches low doorway stretch 3 x 30 sec     Moist Heat Therapy   Number Minutes Moist Heat 15 Minutes   Moist Heat Location Shoulder     Electrical Stimulation   Electrical Stimulation Location Lt shoulder/upper trap   Electrical Stimulation Action IFC   Electrical Stimulation Parameters to tolerance x 15 min   Electrical Stimulation Goals Pain                PT Education - 06-01-17 1502    Education provided Yes   Education Details HEP   Person(s) Educated Patient   Methods Explanation;Demonstration;Handout   Comprehension Verbalized understanding;Returned demonstration             PT Long Term Goals - 01-Jun-2017 1502      PT LONG TERM GOAL #1   Title independent with HEP (05/18/17)   Status Achieved     PT LONG TERM GOAL #2   Title verbalize understanding of posture/body  mechanics to decrease risk of reinjury (05/18/17)   Status Achieved     PT LONG TERM GOAL #3   Title perform Lt shoulder AROM without pain for improved function (05/18/17)   Status Partially Met     PT LONG TERM GOAL #4   Title demonstrate 4/5 strength Lt shoulder for improved strength and function (05/18/17)   Status Achieved     PT LONG TERM GOAL #5   Title report 75% improvement in Lt shoulder pain with sleeping for improved function (05/18/17)   Status Achieved               Plan - 06-01-17 1503    Clinical Impression Statement Pt has met/partially met all goals and is ready for d/c.  Pt pleased with current funtional progress and agreeable to d/c.  Recommended continued HEP.   PT Treatment/Interventions ADLs/Self Care Home Management;Cryotherapy;Electrical Stimulation;Iontophoresis 29m/ml Dexamethasone;Moist Heat;Ultrasound;Therapeutic exercise;Therapeutic activities;Functional mobility training;Patient/family education;Manual techniques;Vasopneumatic Device;Taping;Dry needling   PT Next Visit Plan d/c PT   Consulted and Agree with Plan of Care Patient      Patient will benefit from skilled therapeutic intervention in order to improve the following deficits and impairments:  Pain, Decreased strength, Postural dysfunction, Decreased range of motion, Increased fascial restricitons, Increased muscle spasms, Impaired UE functional use  Visit Diagnosis: Chronic left shoulder pain  Abnormal posture  Muscle weakness (generalized)       [facility] PT PB G-CODES - 0Jul 25, [id number]    Functional Assessment Tool Used  clinical judgement; strength 4 - 4+/5; ROM WNL with min pain   Functional Limitations Carrying, moving and handling objects   Carrying, Moving and Handling Objects Goal Status (([id number] At least 1 percent but less than 20 percent impaired, limited or restricted   Carrying, Moving and Handling Objects Discharge Status [phone] At least 1 percent but less than 20 percent  impaired, limited or restricted      Problem List Patient Active Problem List   Diagnosis Date Noted  . Kyphosis 05/04/2017  . Chronic shoulder bursitis, left 02/15/2017  . 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 Migraine headache 01/26/2017  . Back pain 01/26/2017  . Chronic kidney disease 01/26/2017  . Hypertensive retinopathy 11/29/2016  . Osteoarthritis of right knee 07/28/2016  . Basal cell carcinoma of skin 11/26/2015  . Abnormal CXR 01/27/2013  . Postmenopausal symptoms   . Obesity 12/25/2009  . Essential hypertension, benign 12/24/2008  . ALLERGIC RHINITIS, CHRONIC 12/24/2008  . Hyperlipidemia 12/19/2007  .  ANEMIA 12/19/2007  . GERD 12/19/2007  . Osteopenia 12/19/2007      Laureen Abrahams, PT, DPT 05/16/17 3:05 PM    [place], [place], [address] Phone: [phone]   Fax:  [phone]  Name: Joy Martin MRN: [id number] Date of Birth: 1941/06/14      PHYSICAL THERAPY DISCHARGE SUMMARY  Visits from Start of Care: 10  Current functional level related to goals / functional outcomes: See above   Remaining deficits: Pt reports min pain with end range motion which resolves immediately   Education / Equipment: HEP  Plan: Patient agrees to discharge.  Patient goals were partially met. Patient is being discharged due to meeting the stated rehab goals.  ?????       Laureen Abrahams, PT, DPT 05/16/17 3:06 PM  [facility] [address] [place], [place], [address] Phone: [phone]  Fax: [phone]

## 2017-06-15 ENCOUNTER — Other Ambulatory Visit: Payer: Self-pay | Admitting: Internal Medicine

## 2017-07-13 ENCOUNTER — Other Ambulatory Visit: Payer: Self-pay | Admitting: Internal Medicine

## 2017-07-15 ENCOUNTER — Other Ambulatory Visit: Payer: Self-pay | Admitting: Internal Medicine

## 2017-07-18 ENCOUNTER — Other Ambulatory Visit: Payer: Self-pay | Admitting: Internal Medicine

## 2017-07-26 NOTE — Patient Instructions (Addendum)
  Test(s) ordered today. Your results will be released to [place] (or called to you) after review, usually within 72hours after test completion. If any changes need to be made, you will be notified at that same time.  All other Health Maintenance issues reviewed.   All recommended immunizations and age-appropriate screenings are up-to-date or discussed.  No immunizations administered today.   Medications reviewed and updated.  Changes include stop the valsartan-hctz and start avapro 300 mg daily.  Also start zantac and try to wean off the nexium.   Try taking tylenol for your knee pain and avoid nabumetone.    Your prescription(s) have been submitted to your pharmacy. Please take as directed and contact our office if you believe you are having problem(s) with the medication(s).   Please followup in 3-4 weeks for a BP check and 6 months with me

## 2017-07-26 NOTE — Progress Notes (Signed)
Subjective:    Patient ID: Joy Martin, female    DOB: 1941-02-25, 76 y.o.   MRN: [id number]  HPI The patient is here for follow up.  Hypertension: She is taking her medication daily. She is compliant with a low sodium diet.  She denies chest pain, palpitations, edema. She is exercising regularly - walks her dog.  She does not monitor her blood pressure at home.    Hyperlipidemia: She is taking her medication daily. She is compliant with a low fat/cholesterol diet. She is exercising regularly - walks her dog. She denies myalgias.   GERD:  She is taking her medication every other day.  She denies any GERD symptoms and feels her GERD is well controlled.   Cough: she still has a cough.  She thinks it comes PND and eating something dry irritates her throat.  She typically coughs in the morning when she wakes up in the morning and when she uses the computer for some reason.  She sometimes happens when she eats.  She uses flonase in the morning.   Knee arthritis:  She is taking nabumetone once a day as needed for her knee pain.  She does not take it daily.   CKD:  She is taking Excedrin ES for headaches and is decreasing her intake.  She only takes nabumetone as needed.  She does not drink enough water during the day.    Medications and allergies reviewed with patient and updated if appropriate.  Patient Active Problem List   Diagnosis Date Noted  . Prediabetes 07/27/2017  . Kyphosis 05/04/2017  . Chronic shoulder bursitis, left 02/15/2017  . 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 Migraine headache 01/26/2017  . Back pain 01/26/2017  . Chronic kidney disease 01/26/2017  . Hypertensive retinopathy 11/29/2016  . Osteoarthritis of right knee 07/28/2016  . Basal cell carcinoma of skin 11/26/2015  . Abnormal CXR 01/27/2013  . Postmenopausal symptoms   . Obesity 12/25/2009  . Essential hypertension, benign 12/24/2008  . ALLERGIC RHINITIS, CHRONIC 12/24/2008  .  Hyperlipidemia 12/19/2007  . ANEMIA 12/19/2007  . GERD 12/19/2007  . Osteopenia 12/19/2007    Current Outpatient Prescriptions on File Prior to Visit  Medication Sig Dispense Refill  . amLODipine (NORVASC) 2.5 MG tablet Take 1 tablet (2.5 mg total) by mouth daily. 30 tablet 5  . benzonatate (TESSALON) 200 MG capsule TAKE 1 CAPSULE (200 MG TOTAL) BY MOUTH 3 (THREE) TIMES DAILY AS NEEDED FOR COUGH. 30 capsule 0  . Calcium Carbonate (CALCIUM 500 PO) Take by mouth.      . cholecalciferol (VITAMIN D) 1000 UNITS tablet Take 1,000 Units by mouth daily.    . ciclopirox (LOPROX) 0.77 % cream Apply 1 application topically 2 (two) times daily as needed. rash    . cycloSPORINE (RESTASIS) 0.05 % ophthalmic emulsion 1 drop 2 (two) times daily.      Marland Kitchen esomeprazole (NEXIUM) 40 MG capsule TAKE ONE CAPSULE BY MOUTH TWICE A DAY BEFORE A MEAL (Patient taking differently: TAKE ONE CAPSULE BY MOUTH EVERY OTHER DAY BEFORE A MEAL) 180 capsule 2  . Flaxseed, Linseed, (FLAXSEED OIL PO) Take 2 tablets by mouth daily.    . fluticasone (FLONASE) 50 MCG/ACT nasal spray PLACE 1 SPRAY INTO BOTH NOSTRILS DAILY. 16 g 3  . Multiple Vitamins-Minerals (CENTRUM SILVER PO) Take 1 tablet by mouth daily.    . nabumetone (RELAFEN) 750 MG tablet Take 1 tablet (750 mg total) by mouth 2 (two) times daily as needed. (Patient  taking differently: Take 750 mg by mouth daily. ) 60 tablet 1  . nadolol (CORGARD) 40 MG tablet TAKE 1 TABLET (40 MG TOTAL) BY MOUTH 2 (TWO) TIMES DAILY. 180 tablet 3  . simvastatin (ZOCOR) 40 MG tablet Take 1 tablet (40 mg total) by mouth at bedtime. 90 tablet 3  . topiramate (TOPAMAX) 50 MG tablet Take 1 tablet (50 mg total) by mouth 2 (two) times daily. 180 tablet 4  . valsartan-hydrochlorothiazide (DIOVAN-HCT) 320-12.5 MG tablet TAKE 1 TABLET BY MOUTH DAILY. 90 tablet 2   No current facility-administered medications on file prior to visit.     Past Medical History:  Diagnosis Date  . ALLERGIC RHINITIS,  CHRONIC   . ANEMIA   . Arthritis    "knees mainly; thumbs"  . 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 Chronic sinusitis    follows with ENT for same  . COPD (chronic obstructive pulmonary disease) ([place]) dx 2013   GOLD 1, follows with pulm for same  . Episodic tension type headache   . EXOGENOUS OBESITY   . GERD   . HYPERLIPIDEMIA   . Hypertension   . Migraines 07/10/2012   "over; last one was @ age 73"  . OSTEOPENIA     Past Surgical History:  Procedure Laterality Date  . APPENDECTOMY  07/10/2012  . BREAST CYST ASPIRATION  ?1980's   right  . EYE SURGERY     both eyes  . KNEE ARTHROSCOPY  1970's or 1980's   right; torn cartilage  . LAPAROSCOPIC APPENDECTOMY  07/10/2012   Procedure: APPENDECTOMY LAPAROSCOPIC;  Surgeon: Harl Bowie, MD;  Location: [facility];  Service: General;  Laterality: N/A;  . MOHS SURGERY  01/2016   nose  . MOLE REMOVAL  1957   "my back"  . SKIN CANCER EXCISION     "2 on my back; 2 on my face"  . TONSILLECTOMY AND ADENOIDECTOMY  1946  . [place] EXTRACTION  1959    Social History   Social History  . Marital status: Single    Spouse name: N/A  . Number of children: 0  . Years of education: 75   Occupational History  .      retired Pharmacist, hospital, 5th grade   Social History Main Topics  . Smoking status: Former Smoker    Packs/day: 0.75    Years: 20.00    Types: Cigarettes    Quit date: 07/25/1982  . Smokeless tobacco: Never Used     Comment: 07/10/2012 "stopped smoking cigarettes 1980's"  . Alcohol use Yes     Comment: 07/10/2012 "glass of wine 1-2X/yr"  . Drug use: No  . Sexual activity: No   Other Topics Concern  . Not on file   Social History Narrative   Retired Pharmacist, hospital, 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   Caffeine- coffee 1 cup, some Coke    Family History  Problem Relation Age of Onset  . Prostate cancer Father   . Cancer Father        prostate  . Cancer Mother        [place]  microgobular anemia  . Alcohol abuse Other   . Heart disease Other   . Hypertension Other   . Diabetes Other   . Pancreatic cancer Other   . Cancer Maternal Grandfather        pancreatic    Review of Systems  Constitutional: Negative for chills and fever.  HENT: Positive  for postnasal drip.   Respiratory: Positive for cough and shortness of breath (exertion up a hill). Negative for wheezing.   Cardiovascular: Negative for chest pain, palpitations and leg swelling.  Musculoskeletal: Positive for neck pain (intermittent).  Neurological: Positive for headaches (following with neurology). Negative for light-headedness.       Objective:   Vitals:   07/27/17 1106  BP: 124/80  Pulse: (!) 58  Resp: 16  Temp: 97.9 F (36.6 C)  SpO2: 94%   Wt Readings from Last 3 Encounters:  07/27/17 164 lb (74.4 kg)  05/04/17 167 lb (75.8 kg)  04/12/17 170 lb 6 oz (77.3 kg)   Body mass index is 30.99 kg/m.   Physical Exam    Constitutional: Appears well-developed and well-nourished. No distress.  HENT:  Head: Normocephalic and atraumatic.  Neck: Neck supple. No tracheal deviation present. No thyromegaly present.  No cervical lymphadenopathy Cardiovascular: Normal rate, regular rhythm and normal heart sounds.   No murmur heard. No carotid bruit .  No edema Pulmonary/Chest: Effort normal and breath sounds normal. No respiratory distress. No has no wheezes. No rales.  Skin: Skin is warm and dry. Not diaphoretic.  Psychiatric: Normal mood and affect. Behavior is normal.      Assessment & Plan:    See Problem List for Assessment and Plan of chronic medical problems.

## 2017-07-27 ENCOUNTER — Other Ambulatory Visit (INDEPENDENT_AMBULATORY_CARE_PROVIDER_SITE_OTHER): Payer: Medicare Other

## 2017-07-27 ENCOUNTER — Encounter: Payer: Self-pay | Admitting: Internal Medicine

## 2017-07-27 ENCOUNTER — Ambulatory Visit (INDEPENDENT_AMBULATORY_CARE_PROVIDER_SITE_OTHER): Payer: Medicare Other | Admitting: Internal Medicine

## 2017-07-27 VITALS — BP 124/80 | HR 58 | Temp 97.9°F | Resp 16 | Wt 164.0 lb

## 2017-07-27 DIAGNOSIS — R05 Cough: Secondary | ICD-10-CM | POA: Insufficient documentation

## 2017-07-27 DIAGNOSIS — I10 Essential (primary) hypertension: Secondary | ICD-10-CM

## 2017-07-27 DIAGNOSIS — N1832 Chronic kidney disease, stage 3b: Secondary | ICD-10-CM | POA: Insufficient documentation

## 2017-07-27 DIAGNOSIS — E7800 Pure hypercholesterolemia, unspecified: Secondary | ICD-10-CM

## 2017-07-27 DIAGNOSIS — N189 Chronic kidney disease, unspecified: Secondary | ICD-10-CM | POA: Diagnosis not present

## 2017-07-27 DIAGNOSIS — R7303 Prediabetes: Secondary | ICD-10-CM

## 2017-07-27 DIAGNOSIS — M1711 Unilateral primary osteoarthritis, right knee: Secondary | ICD-10-CM

## 2017-07-27 DIAGNOSIS — R059 Cough, unspecified: Secondary | ICD-10-CM

## 2017-07-27 LAB — COMPREHENSIVE METABOLIC PANEL
ALBUMIN: 3.8 g/dL (ref 3.5–5.2)
ALK PHOS: 73 U/L (ref 39–117)
ALT: 15 U/L (ref 0–35)
AST: 20 U/L (ref 0–37)
BUN: 28 mg/dL — AB (ref 6–23)
CHLORIDE: 104 meq/L (ref 96–112)
CO2: 32 mEq/L (ref 19–32)
Calcium: 11.5 mg/dL — ABNORMAL HIGH (ref 8.4–10.5)
Creatinine, Ser: 1.51 mg/dL — ABNORMAL HIGH (ref 0.40–1.20)
GFR: 35.38 mL/min — AB (ref >60.00)
GLUCOSE: 106 mg/dL — AB (ref 70–99)
POTASSIUM: 3.4 meq/L — AB (ref 3.5–5.1)
SODIUM: 142 meq/L (ref 135–145)
Total Bilirubin: 0.3 mg/dL (ref 0.2–1.2)
Total Protein: 7.4 g/dL (ref 6.0–8.3)

## 2017-07-27 LAB — LIPID PANEL
Cholesterol: 162 mg/dL (ref 0–200)
HDL: 76.8 mg/dL (ref >39.00)
LDL Cholesterol: 51 mg/dL (ref 0–99)
NONHDL: 85.34
Total CHOL/HDL Ratio: 2
Triglycerides: 172 mg/dL — ABNORMAL HIGH (ref 0.0–149.0)
VLDL: 34.4 mg/dL (ref 0.0–40.0)

## 2017-07-27 LAB — HEMOGLOBIN A1C: HEMOGLOBIN A1C: 6.1 % (ref 4.6–6.5)

## 2017-07-27 MED ORDER — SIMVASTATIN 40 MG PO TABS: 40.0000 mg | ORAL_TABLET | Freq: Every day | ORAL | 3 refills | Status: DC

## 2017-07-27 MED ORDER — AMLODIPINE BESYLATE 2.5 MG PO TABS: 2.5000 mg | ORAL_TABLET | Freq: Every day | ORAL | 3 refills | Status: DC

## 2017-07-27 MED ORDER — RANITIDINE HCL 150 MG PO TABS: 150.0000 mg | ORAL_TABLET | Freq: Every day | ORAL | 1 refills | Status: DC

## 2017-07-27 MED ORDER — IRBESARTAN 300 MG PO TABS: 300.0000 mg | ORAL_TABLET | Freq: Every day | ORAL | 5 refills | Status: DC

## 2017-07-27 NOTE — Assessment & Plan Note (Signed)
Multiple causes including postnasal drip, food irritates her throat, dry throat She will try taking Flonase at night instead of the morning to see if that helps with some of the postnasal drip Increase water intake Sip Water while eating

## 2017-07-27 NOTE — Assessment & Plan Note (Signed)
cmp today Will try stopped hctz Increase water intake Try to avoid nabumetone - take tylenol instead Trying to decrease Excedrin use - stressed avoiding this as it may further damage the kidneys

## 2017-07-27 NOTE — Assessment & Plan Note (Signed)
Check a1c Low sugar / carb diet Stressed regular exercise   

## 2017-07-27 NOTE — Assessment & Plan Note (Signed)
Check lipid panel  Continue daily statin Regular exercise and healthy diet encouraged  

## 2017-07-27 NOTE — Assessment & Plan Note (Addendum)
Takes nabumetone as needed - once a day Encouraged to avoid using - taking tylenol instead

## 2017-07-27 NOTE — Assessment & Plan Note (Signed)
Controlled On valsartan-hctz - will change given recent recall - will change to avaparo 300 mg daily - will try eliminating hctz to help improve kidney function Continue other meds Nurse visit in 3-4 weeks for BP check

## 2017-07-28 ENCOUNTER — Encounter: Payer: Self-pay | Admitting: Internal Medicine

## 2017-08-23 ENCOUNTER — Ambulatory Visit: Payer: Medicare Other | Admitting: General Practice

## 2017-08-23 VITALS — BP 150/90

## 2017-08-23 DIAGNOSIS — Z0130 Encounter for examination of blood pressure without abnormal findings: Secondary | ICD-10-CM

## 2017-08-23 NOTE — Progress Notes (Signed)
BP Readings from Last 3 Encounters:  08/23/17 (!) 150/90  07/27/17 124/80  05/04/17 110/60   Blood pressure slightly elevated here today, but in the recent past has been very well controlled. No changes in medication for now.

## 2017-08-24 ENCOUNTER — Encounter: Payer: Self-pay | Admitting: Internal Medicine

## 2017-08-24 MED ORDER — NADOLOL 40 MG PO TABS: 40.0000 mg | ORAL_TABLET | Freq: Two times a day (BID) | ORAL | 1 refills | Status: DC

## 2017-08-25 ENCOUNTER — Other Ambulatory Visit: Payer: Self-pay | Admitting: Internal Medicine

## 2017-08-25 ENCOUNTER — Encounter: Payer: Self-pay | Admitting: Internal Medicine

## 2017-08-25 MED ORDER — VALSARTAN-HYDROCHLOROTHIAZIDE 320-12.5 MG PO TABS: 1.0000 | ORAL_TABLET | Freq: Every day | ORAL | 1 refills | Status: DC

## 2017-08-25 NOTE — Addendum Note (Signed)
Addended by: [name] on: 08/25/2017 04:42 PM   Modules accepted: Orders

## 2017-11-19 ENCOUNTER — Other Ambulatory Visit: Payer: Self-pay | Admitting: Internal Medicine

## 2018-01-11 ENCOUNTER — Other Ambulatory Visit: Payer: Self-pay | Admitting: Internal Medicine

## 2018-01-16 ENCOUNTER — Encounter: Payer: Self-pay | Admitting: Internal Medicine

## 2018-01-16 ENCOUNTER — Ambulatory Visit (INDEPENDENT_AMBULATORY_CARE_PROVIDER_SITE_OTHER)
Admission: RE | Admit: 2018-01-16 | Discharge: 2018-01-16 | Disposition: A | Discharge status: Home / Self Care | Payer: Medicare Other | Source: Ambulatory Visit | Attending: Internal Medicine | Admitting: Internal Medicine

## 2018-01-16 ENCOUNTER — Ambulatory Visit: Payer: Medicare Other | Admitting: Internal Medicine

## 2018-01-16 VITALS — BP 110/82 | HR 67 | Temp 98.4°F | Resp 16 | Wt 153.0 lb

## 2018-01-16 DIAGNOSIS — M792 Neuralgia and neuritis, unspecified: Secondary | ICD-10-CM | POA: Insufficient documentation

## 2018-01-16 DIAGNOSIS — M542 Cervicalgia: Secondary | ICD-10-CM | POA: Insufficient documentation

## 2018-01-16 MED ORDER — GABAPENTIN 100 MG PO CAPS: ORAL_CAPSULE | ORAL | 3 refills | Status: DC

## 2018-01-16 NOTE — Progress Notes (Signed)
Subjective:    Patient ID: Joy Martin, female    DOB: 15-Nov-1940, 77 y.o.   MRN: [id number]  HPI The patient is here for an acute visit.  The end of February she started to notice increased pain in her upper body.  It was more than her usual pain.  It did get worse.  The area became very sensitive and she had a burning sensation.  It felt almost like a burn.  She thought it might be shingles.  She denies any rash or blisters.    She felt it initially in her left axilla and it went away.  She then developed the symptoms on the right side.  It is in the axilla and goes down her breast/ right side, down the inside of her arm to her elbow, down her back and into her shoulder.  It radiates up to her head a little. She has a lightening sensation in top of her head and it goes around and feels like it comes out the top right side of her head.    Pain is a 10/10.  She did have the old shingles vaccine and both of the new shingles vaccines.  She has had slightly increased fatigue.  She denies any fever or chills.  There have been no changes in her vision.  She does experience some weakness in her right arm and hand, but she does not think it is new that there had been any increased weakness.  She does experience some neck pain and stiffness, which is not new.  Medications and allergies reviewed with patient and updated if appropriate.  Patient Active Problem List   Diagnosis Date Noted  . Prediabetes 07/27/2017  . Cough 07/27/2017  . Kyphosis 05/04/2017  . Chronic shoulder bursitis, left 02/15/2017  . 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 Migraine headache 01/26/2017  . Back pain 01/26/2017  . Chronic kidney disease 01/26/2017  . Hypertensive retinopathy 11/29/2016  . Osteoarthritis of right knee 07/28/2016  . Basal cell carcinoma of skin 11/26/2015  . Abnormal CXR 01/27/2013  . Postmenopausal symptoms   . Obesity 12/25/2009  . Essential hypertension, benign 12/24/2008  .  ALLERGIC RHINITIS, CHRONIC 12/24/2008  . Hyperlipidemia 12/19/2007  . ANEMIA 12/19/2007  . GERD 12/19/2007  . Osteopenia 12/19/2007    Current Outpatient Medications on File Prior to Visit  Medication Sig Dispense Refill  . amLODipine (NORVASC) 2.5 MG tablet Take 1 tablet (2.5 mg total) by mouth daily. 90 tablet 3  . benzonatate (TESSALON) 200 MG capsule TAKE 1 CAPSULE (200 MG TOTAL) BY MOUTH 3 (THREE) TIMES DAILY AS NEEDED FOR COUGH. 30 capsule 0  . Calcium Carbonate (CALCIUM 500 PO) Take by mouth.      . cholecalciferol (VITAMIN D) 1000 UNITS tablet Take 1,000 Units by mouth daily.    . ciclopirox (LOPROX) 0.77 % cream Apply 1 application topically 2 (two) times daily as needed. rash    . cycloSPORINE (RESTASIS) 0.05 % ophthalmic emulsion 1 drop 2 (two) times daily.      Marland Kitchen esomeprazole (NEXIUM) 40 MG capsule TAKE ONE CAPSULE BY MOUTH TWICE A DAY BEFORE A MEAL 180 capsule 1  . Flaxseed, Linseed, (FLAXSEED OIL PO) Take 2 tablets by mouth daily.    . fluticasone (FLONASE) 50 MCG/ACT nasal spray PLACE 1 SPRAY INTO BOTH NOSTRILS DAILY. 16 g 3  . Multiple Vitamins-Minerals (CENTRUM SILVER PO) Take 1 tablet by mouth daily.    . nabumetone (RELAFEN) 750 MG tablet Take  1 tablet (750 mg total) by mouth 2 (two) times daily as needed. (Patient taking differently: Take 750 mg by mouth daily. ) 60 tablet 1  . nadolol (CORGARD) 40 MG tablet Take 1 tablet (40 mg total) by mouth 2 (two) times daily. 180 tablet 1  . simvastatin (ZOCOR) 40 MG tablet Take 1 tablet (40 mg total) by mouth at bedtime. 90 tablet 3  . topiramate (TOPAMAX) 50 MG tablet Take 1 tablet (50 mg total) by mouth 2 (two) times daily. 180 tablet 4  . valsartan-hydrochlorothiazide (DIOVAN-HCT) 320-12.5 MG tablet Take 1 tablet by mouth daily. 90 tablet 1   No current facility-administered medications on file prior to visit.     Past Medical History:  Diagnosis Date  . ALLERGIC RHINITIS, CHRONIC   . ANEMIA   . Arthritis    "knees  mainly; thumbs"  . 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 Chronic sinusitis    follows with ENT for same  . COPD (chronic obstructive pulmonary disease) ([place]) dx 2013   GOLD 1, follows with pulm for same  . Episodic tension type headache   . EXOGENOUS OBESITY   . GERD   . HYPERLIPIDEMIA   . Hypertension   . Migraines 07/10/2012   "over; last one was @ age 57"  . OSTEOPENIA     Past Surgical History:  Procedure Laterality Date  . APPENDECTOMY  07/10/2012  . BREAST CYST ASPIRATION  ?1980's   right  . EYE SURGERY     both eyes  . KNEE ARTHROSCOPY  1970's or 1980's   right; torn cartilage  . LAPAROSCOPIC APPENDECTOMY  07/10/2012   Procedure: APPENDECTOMY LAPAROSCOPIC;  Surgeon: Harl Bowie, MD;  Location: [facility];  Service: General;  Laterality: N/A;  . MOHS SURGERY  01/2016   nose  . MOLE REMOVAL  1957   "my back"  . SKIN CANCER EXCISION     "2 on my back; 2 on my face"  . TONSILLECTOMY AND ADENOIDECTOMY  1946  . [place] EXTRACTION  1959    Social History   Socioeconomic History  . Marital status: Single    Spouse name: None  . Number of children: 0  . Years of education: 40  . Highest education level: None  Social Needs  . Financial resource strain: None  . Food insecurity - worry: None  . Food insecurity - inability: None  . Transportation needs - medical: None  . Transportation needs - non-medical: None  Occupational History    Comment: retired Pharmacist, hospital, 5th grade  Tobacco Use  . Smoking status: Former Smoker    Packs/day: 0.75    Years: 20.00    Pack years: 15.00    Types: Cigarettes    Last attempt to quit: 07/25/1982    Years since quitting: 35.5  . Smokeless tobacco: Never Used  . Tobacco comment: 07/10/2012 "stopped smoking cigarettes 1980's"  Substance and Sexual Activity  . Alcohol use: Yes    Comment: 07/10/2012 "glass of wine 1-2X/yr"  . Drug use: No  . Sexual activity: No  Other Topics  Concern  . 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 Prostate cancer Father   . Cancer Father        prostate  . Cancer Mother  waldenstruns microgobular anemia  . Alcohol abuse Other   . Heart disease Other   . Hypertension Other   . Diabetes Other   . Pancreatic cancer Other   . Cancer Maternal Grandfather        pancreatic    Review of Systems  Constitutional: Positive for fatigue. Negative for chills and fever.  Eyes: Negative for visual disturbance.  Musculoskeletal: Positive for neck pain (chronic ? worse) and neck stiffness.  Skin: Negative for rash.  Neurological: Positive for weakness (sometimes in right arm - ? not new) and headaches. Negative for numbness (burning sensation).       Objective:   Vitals:   01/16/18 1049  BP: 110/82  Pulse: 67  Resp: 16  Temp: 98.4 F (36.9 C)  SpO2: 94%   Wt Readings from Last 3 Encounters:  01/16/18 153 lb (69.4 kg)  07/27/17 164 lb (74.4 kg)  05/04/17 167 lb (75.8 kg)   Body mass index is 28.91 kg/m.   Physical Exam  Constitutional: She is oriented to person, place, and time. She appears well-developed and well-nourished. No distress.  HENT:  Head: Normocephalic and atraumatic.  Musculoskeletal: She exhibits no deformity.  No cervical or thoracic spinal tenderness with palpation  Neurological: She is alert and oriented to person, place, and time. No cranial nerve deficit.  Slight hypersensitivity right axilla, side and to a mild degree right medial arm.  Normal sensation shoulder, neck and face  Skin: Skin is warm and dry. No rash noted. She is not diaphoretic. No erythema.           Assessment & Plan:    See Problem List for Assessment and Plan of chronic medical problems.

## 2018-01-16 NOTE — Assessment & Plan Note (Signed)
Burning sensation in primarily T1 distribution, but not confined here No rash, but still could possibly be shingles Possibly urology for other reason We will check x-rays of cervical and thoracic spine Start gabapentin Follows with neurology and if her symptoms do not improve she will move up her neurology appointment for further evaluation

## 2018-01-16 NOTE — Patient Instructions (Signed)
Have the xrays today.   Start the gabapentin at night.   Call any questions or concerns.

## 2018-01-16 NOTE — Assessment & Plan Note (Signed)
Chronic neck pain and stiffness Given new neuralgia Will check x-ray of thoracic and cervical spine

## 2018-01-24 NOTE — Patient Instructions (Addendum)
  Test(s) ordered today. Your results will be released to [facility] (or called to you) after review, usually within 72hours after test completion. If any changes need to be made, you will be notified at that same time.    Medications reviewed and updated.  Changes include trying pepcid 40 mg daily.   Stopping valsartan-hctz and starting valsartan 320 mg and increasing amlodpine to 5 mg .  augmentin was sent to your pharmacy.   Your prescription(s) have been submitted to your pharmacy. Please take as directed and contact our office if you believe you are having problem(s) with the medication(s).  Please followup in 1 month

## 2018-01-24 NOTE — Progress Notes (Signed)
Subjective:    Patient ID: Joy Martin, female    DOB: [birth year], 77 y.o.   MRN: [id number]  HPI The patient is here for follow up.  Hypertension: She is taking her medication daily. She is compliant with a low sodium diet.  She denies chest pain, palpitations, edema, and regular headaches. She is not exercising regularly.  She does not monitor her blood pressure at home.  Chronic kidney disease: She does not take any NSAIDs.  She feels she does drink enough fluid throughout the day.  She is taking her medication daily as prescribed.  Prediabetes:  She is compliant with a low sugar/carbohydrate diet.  She is not exercising regularly.  Hyperlipidemia: She is taking her medication daily. She is compliant with a low fat/cholesterol diet. She is not exercising regularly. She denies myalgias.   GERD:  She is taking her medication daily as prescribed.  She denies any GERD symptoms and feels her GERD is well controlled.   Neuralgia;  She is taking gabapentin 200 mg nightly.  It does make her a little drowsy.  The burning pain is better - it is intermittent - not constant.  It seems to be more localized to under the arm.  Cough, shortness of breath: She continues to have a cough.  She will bring up some discolored phlegm that is yellow-green at times.  She states she has shortness of breath, but it is chronic and she does not think it is worse.  She is experiencing some headaches.  She denies fever or chills.  She denies other cold symptoms.  His cough has been somewhat persistent and does not seem to be going away.  Medications and allergies reviewed with patient and updated if appropriate.  Patient Active Problem List   Diagnosis Date Noted  . Neuralgia 01/16/2018  . Neck pain 01/16/2018  . Prediabetes 07/27/2017  . Cough 07/27/2017  . Kyphosis 05/04/2017  . Chronic shoulder bursitis, left 02/15/2017  . 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 Migraine headache 01/26/2017    . Back pain 01/26/2017  . Chronic kidney disease 01/26/2017  . Hypertensive retinopathy 11/29/2016  . Osteoarthritis of right knee 07/28/2016  . Basal cell carcinoma of skin 11/26/2015  . Abnormal CXR 01/27/2013  . Postmenopausal symptoms   . Obesity 12/25/2009  . Essential hypertension, benign 12/24/2008  . ALLERGIC RHINITIS, CHRONIC 12/24/2008  . Hyperlipidemia 12/19/2007  . ANEMIA 12/19/2007  . GERD 12/19/2007  . Osteopenia 12/19/2007    Current Outpatient Medications on File Prior to Visit  Medication Sig Dispense Refill  . amLODipine (NORVASC) 2.5 MG tablet Take 1 tablet (2.5 mg total) by mouth daily. 90 tablet 3  . Calcium Carbonate (CALCIUM 500 PO) Take by mouth.      . cholecalciferol (VITAMIN D) 1000 UNITS tablet Take 1,000 Units by mouth daily.    . ciclopirox (LOPROX) 0.77 % cream Apply 1 application topically 2 (two) times daily as needed. rash    . cycloSPORINE (RESTASIS) 0.05 % ophthalmic emulsion 1 drop 2 (two) times daily.      Marland Kitchen esomeprazole (NEXIUM) 40 MG capsule Take 40 mg by mouth every other day.    . Flaxseed, Linseed, (FLAXSEED OIL PO) Take 2 tablets by mouth daily.    . fluticasone (FLONASE) 50 MCG/ACT nasal spray PLACE 1 SPRAY INTO BOTH NOSTRILS DAILY. 16 g 3  . gabapentin (NEURONTIN) 100 MG capsule Take 100 mg at night, if tolerated increase to 200 mg at night 60 capsule 3  .  Multiple Vitamins-Minerals (CENTRUM SILVER PO) Take 1 tablet by mouth daily.    . nadolol (CORGARD) 40 MG tablet Take 1 tablet (40 mg total) by mouth 2 (two) times daily. 180 tablet 1  . simvastatin (ZOCOR) 40 MG tablet Take 1 tablet (40 mg total) by mouth at bedtime. 90 tablet 3  . topiramate (TOPAMAX) 50 MG tablet Take 1 tablet (50 mg total) by mouth 2 (two) times daily. 180 tablet 4  . valsartan-hydrochlorothiazide (DIOVAN-HCT) 320-12.5 MG tablet Take 1 tablet by mouth daily. 90 tablet 1   No current facility-administered medications on file prior to visit.     Past Medical  History:  Diagnosis Date  . ALLERGIC RHINITIS, CHRONIC   . ANEMIA   . Arthritis    "knees mainly; thumbs"  . 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 Chronic sinusitis    follows with ENT for same  . COPD (chronic obstructive pulmonary disease) ([place] [facility]) dx 2013   GOLD 1, follows with pulm for same  . Episodic tension type headache   . EXOGENOUS OBESITY   . GERD   . HYPERLIPIDEMIA   . Hypertension   . Migraines 07/10/2012   "over; last one was @ age 48"  . OSTEOPENIA     Past Surgical History:  Procedure Laterality Date  . APPENDECTOMY  07/10/2012  . BREAST CYST ASPIRATION  ?1980's   right  . EYE SURGERY     both eyes  . KNEE ARTHROSCOPY  1970's or 1980's   right; torn cartilage  . LAPAROSCOPIC APPENDECTOMY  07/10/2012   Procedure: APPENDECTOMY LAPAROSCOPIC;  Surgeon: Harl Bowie, MD;  Location: [facility];  Service: General;  Laterality: N/A;  . MOHS SURGERY  01/2016   nose  . MOLE REMOVAL  1957   "my back"  . SKIN CANCER EXCISION     "2 on my back; 2 on my face"  . TONSILLECTOMY AND ADENOIDECTOMY  1946  . [place] EXTRACTION  1959    Social History   Socioeconomic History  . Marital status: Single    Spouse name: None  . Number of children: 0  . Years of education: 63  . Highest education level: None  Social Needs  . Financial resource strain: None  . Food insecurity - worry: None  . Food insecurity - inability: None  . Transportation needs - medical: None  . Transportation needs - non-medical: None  Occupational History    Comment: retired Pharmacist, hospital, 5th grade  Tobacco Use  . Smoking status: Former Smoker    Packs/day: 0.75    Years: 20.00    Pack years: 15.00    Types: Cigarettes    Last attempt to quit: 07/25/1982    Years since quitting: 35.5  . Smokeless tobacco: Never Used  . Tobacco comment: 07/10/2012 "stopped smoking cigarettes 1980's"  Substance and Sexual Activity  . Alcohol use: Yes     Comment: 07/10/2012 "glass of wine 1-2X/yr"  . Drug use: No  . Sexual activity: No  Other Topics Concern  . 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 Prostate cancer Father   . Cancer Father        prostate  . Cancer Mother        waldenstruns microgobular anemia  . Alcohol abuse Other   .  Heart disease Other   . Hypertension Other   . Diabetes Other   . Pancreatic cancer Other   . Cancer Maternal Grandfather        pancreatic    Review of Systems  Constitutional: Negative for chills and fever.  Respiratory: Positive for cough (occ, yellow -green) and shortness of breath (chronic, unchanged). Negative for wheezing.   Cardiovascular: Negative for chest pain, palpitations and leg swelling.  Neurological: Positive for headaches. Negative for light-headedness.       Objective:   Vitals:   01/25/18 1117  BP: 114/76  Pulse: 64  Resp: 16  Temp: 97.6 F (36.4 C)  SpO2: 95%   BP Readings from Last 3 Encounters:  01/25/18 114/76  01/16/18 110/82  08/23/17 (!) 150/90   Wt Readings from Last 3 Encounters:  01/25/18 152 lb (68.9 kg)  01/16/18 153 lb (69.4 kg)  07/27/17 164 lb (74.4 kg)   Body mass index is 28.72 kg/m.   Physical Exam    Constitutional: Appears well-developed and well-nourished. No distress.  HENT:  Head: Normocephalic and atraumatic.  Neck: Neck supple. No tracheal deviation present. No thyromegaly present.  No cervical lymphadenopathy Cardiovascular: Normal rate, regular rhythm and normal heart sounds.   No murmur heard. No carotid bruit .  No edema Pulmonary/Chest: Effort normal and breath sounds normal. No respiratory distress. No has no wheezes. No rales.  Skin: Skin is warm and dry. Not diaphoretic.  Psychiatric: Normal mood and affect. Behavior is normal.      Assessment & Plan:    See Problem List for Assessment and  Plan of chronic medical problems.

## 2018-01-25 ENCOUNTER — Ambulatory Visit: Payer: Medicare Other | Admitting: Internal Medicine

## 2018-01-25 ENCOUNTER — Encounter: Payer: Self-pay | Admitting: Internal Medicine

## 2018-01-25 ENCOUNTER — Other Ambulatory Visit (INDEPENDENT_AMBULATORY_CARE_PROVIDER_SITE_OTHER): Payer: Medicare Other

## 2018-01-25 ENCOUNTER — Telehealth: Payer: Self-pay | Admitting: Internal Medicine

## 2018-01-25 VITALS — BP 114/76 | HR 64 | Temp 97.6°F | Resp 16 | Wt 152.0 lb

## 2018-01-25 DIAGNOSIS — R05 Cough: Secondary | ICD-10-CM | POA: Diagnosis not present

## 2018-01-25 DIAGNOSIS — K219 Gastro-esophageal reflux disease without esophagitis: Secondary | ICD-10-CM

## 2018-01-25 DIAGNOSIS — M792 Neuralgia and neuritis, unspecified: Secondary | ICD-10-CM | POA: Diagnosis not present

## 2018-01-25 DIAGNOSIS — R7303 Prediabetes: Secondary | ICD-10-CM | POA: Diagnosis not present

## 2018-01-25 DIAGNOSIS — N189 Chronic kidney disease, unspecified: Secondary | ICD-10-CM

## 2018-01-25 DIAGNOSIS — R059 Cough, unspecified: Secondary | ICD-10-CM

## 2018-01-25 DIAGNOSIS — I10 Essential (primary) hypertension: Secondary | ICD-10-CM | POA: Diagnosis not present

## 2018-01-25 DIAGNOSIS — E7849 Other hyperlipidemia: Secondary | ICD-10-CM

## 2018-01-25 LAB — CBC WITH DIFFERENTIAL/PLATELET
BASOS PCT: 1.3 % (ref 0.0–3.0)
Basophils Absolute: 0.1 10*3/uL (ref 0.0–0.1)
EOS PCT: 6.1 % — AB (ref 0.0–5.0)
Eosinophils Absolute: 0.5 10*3/uL (ref 0.0–0.7)
HCT: 38.4 % (ref 36.0–46.0)
Hemoglobin: 12.8 g/dL (ref 12.0–15.0)
LYMPHS ABS: 1.3 10*3/uL (ref 0.7–4.0)
Lymphocytes Relative: 15.8 % (ref 12.0–46.0)
MCHC: 33.4 g/dL (ref 30.0–36.0)
MCV: 95.4 fl (ref 78.0–100.0)
MONO ABS: 0.6 10*3/uL (ref 0.1–1.0)
Monocytes Relative: 7.3 % (ref 3.0–12.0)
NEUTROS ABS: 5.8 10*3/uL (ref 1.4–7.7)
NEUTROS PCT: 69.5 % (ref 43.0–77.0)
PLATELETS: 297 10*3/uL (ref 150.0–400.0)
RBC: 4.02 Mil/uL (ref 3.87–5.11)
RDW: 14 % (ref 11.5–15.5)
WBC: 8.4 10*3/uL (ref 4.0–10.5)

## 2018-01-25 LAB — COMPREHENSIVE METABOLIC PANEL
ALT: 14 U/L (ref 0–35)
AST: 19 U/L (ref 0–37)
Albumin: 4.2 g/dL (ref 3.5–5.2)
Alkaline Phosphatase: 69 U/L (ref 39–117)
BUN: 42 mg/dL — ABNORMAL HIGH (ref 6–23)
CHLORIDE: 106 meq/L (ref 96–112)
CO2: 26 meq/L (ref 19–32)
Calcium: 13.3 mg/dL (ref 8.4–10.5)
Creatinine, Ser: 1.67 mg/dL — ABNORMAL HIGH (ref 0.40–1.20)
GFR: 31.46 mL/min — AB (ref >60.00)
GLUCOSE: 117 mg/dL — AB (ref 70–99)
POTASSIUM: 3.6 meq/L (ref 3.5–5.1)
Sodium: 146 mEq/L — ABNORMAL HIGH (ref 135–145)
Total Bilirubin: 0.1 mg/dL — ABNORMAL LOW (ref 0.2–1.2)
Total Protein: 7.6 g/dL (ref 6.0–8.3)

## 2018-01-25 LAB — BASIC METABOLIC PANEL
BUN: 42 mg/dL — AB (ref 6–23)
CHLORIDE: 106 meq/L (ref 96–112)
CO2: 26 meq/L (ref 19–32)
CREATININE: 1.67 mg/dL — AB (ref 0.40–1.20)
Calcium: 13.3 mg/dL (ref 8.4–10.5)
GFR: 31.46 mL/min — ABNORMAL LOW (ref >60.00)
Glucose, Bld: 117 mg/dL — ABNORMAL HIGH (ref 70–99)
Potassium: 3.6 mEq/L (ref 3.5–5.1)
Sodium: 146 mEq/L — ABNORMAL HIGH (ref 135–145)

## 2018-01-25 LAB — HEMOGLOBIN A1C: HEMOGLOBIN A1C: 6.3 % (ref 4.6–6.5)

## 2018-01-25 MED ORDER — AMOXICILLIN-POT CLAVULANATE 875-125 MG PO TABS: 1.0000 | ORAL_TABLET | Freq: Two times a day (BID) | ORAL | 0 refills | Status: DC

## 2018-01-25 MED ORDER — AMLODIPINE BESYLATE 5 MG PO TABS: 5.0000 mg | ORAL_TABLET | Freq: Every day | ORAL | 3 refills | Status: DC

## 2018-01-25 MED ORDER — FAMOTIDINE 40 MG PO TABS: 40.0000 mg | ORAL_TABLET | Freq: Every day | ORAL | 5 refills | Status: DC

## 2018-01-25 MED ORDER — VALSARTAN 320 MG PO TABS: 320.0000 mg | ORAL_TABLET | Freq: Every day | ORAL | 5 refills | Status: DC

## 2018-01-25 NOTE — Telephone Encounter (Signed)
Received call from on call nurse regarding critical lab.  Calcium 13.3.  Spoke to pt and reviewed previous labs and office note.  Renal function also decreased more.  Notified pt of results.  Pt denies any vomiting, diarrhea or other acute symptoms. Was seen last week and this week for side pain.  She feels better than she did last week.  Trying to stay hydrated.  HCTZ was stopped today.  Given no acute change in symptoms, etc, I do not feel ER evaluation for fluids is warranted.  Pt agrees and desires no ER evaluation.  Informed to stop calcium supplements.  Explained I would forward note to you about our conversation and recommendations.  Also discussed with her that you would be contacting her regarding further w/up (including SPEP, UPEP, intact PTH, etc) to help determine etiology of declining renal function and elevated calcium.   She was comfortable with this plan.

## 2018-01-25 NOTE — Assessment & Plan Note (Signed)
Avoiding NSAIDs Tylenol as needed for pain Continue increased water intake Stop hydrochlorothiazide Diovan 320 mg daily Increase amlodipine to 5 mg daily Continue nadolol Trying to keep PPI to a minimum, but has a moderate size hiatal hernia and most likely will require Nexium CMP today and again in 1 month Consider referral to nephrology

## 2018-01-25 NOTE — Assessment & Plan Note (Signed)
Lipid panel has been well controlled and she is not currently fasting Continue statin

## 2018-01-25 NOTE — Assessment & Plan Note (Signed)
Check A1c Stressed low sugar/carbohydrate diet and regular exercise

## 2018-01-25 NOTE — Assessment & Plan Note (Signed)
Continues to have burning sensation under her right arm and right side.  No rash Area is more localized Pain is improved with gabapentin-continue.  She does have some mild drowsiness with it.  Discussed that we can adjust the dose if needed Will discuss with neurology when she sees him, but hopefully will resolve prior to that Neuralgia-?  Secondary to shingles without the rash

## 2018-01-25 NOTE — Telephone Encounter (Addendum)
Please call Joy Martin.  I would like her to come in for additional blood work as soon as she can to help evaluate her elevated calcium.  I have placed a referral for a kidney specialist.    Also have her get a chest xray done (cxr) for her cough since she is here

## 2018-01-25 NOTE — Assessment & Plan Note (Signed)
Non-resolving Concern for possible bacterial cause Augmentin twice daily times 10 days Stressed getting GERD better controlled, which could be aggravating her cough as well-she knows sometimes eating aggravates it as well as a dry throat and postnasal drip

## 2018-01-25 NOTE — Assessment & Plan Note (Signed)
Blood pressure well controlled Continue current medications at current doses CMP 

## 2018-01-25 NOTE — Assessment & Plan Note (Addendum)
Controlled most of the time Takes nexium every other day - may be causing cough Trial of pepcid-she did not tolerate ranitidine, but will try the Pepcid. We ideally want to keep the PPI to a minimum given her chronic kidney disease

## 2018-01-26 ENCOUNTER — Other Ambulatory Visit: Payer: Self-pay | Admitting: Internal Medicine

## 2018-01-26 ENCOUNTER — Other Ambulatory Visit (INDEPENDENT_AMBULATORY_CARE_PROVIDER_SITE_OTHER): Payer: Medicare Other

## 2018-01-26 ENCOUNTER — Ambulatory Visit (INDEPENDENT_AMBULATORY_CARE_PROVIDER_SITE_OTHER)
Admission: RE | Admit: 2018-01-26 | Discharge: 2018-01-26 | Disposition: A | Discharge status: Home / Self Care | Payer: Medicare Other | Source: Ambulatory Visit | Attending: Internal Medicine | Admitting: Internal Medicine

## 2018-01-26 DIAGNOSIS — R05 Cough: Secondary | ICD-10-CM | POA: Diagnosis not present

## 2018-01-26 DIAGNOSIS — N189 Chronic kidney disease, unspecified: Secondary | ICD-10-CM

## 2018-01-26 DIAGNOSIS — R059 Cough, unspecified: Secondary | ICD-10-CM

## 2018-01-26 LAB — TSH: TSH: 1.87 u[IU]/mL (ref 0.35–4.50)

## 2018-01-26 MED ORDER — AZITHROMYCIN 250 MG PO TABS: ORAL_TABLET | ORAL | 0 refills | Status: DC

## 2018-01-26 NOTE — Telephone Encounter (Signed)
Spoke with pt to inform.  

## 2018-01-27 LAB — COMPREHENSIVE METABOLIC PANEL
ALT: 12 U/L (ref 0–35)
AST: 15 U/L (ref 0–37)
Albumin: 3.9 g/dL (ref 3.5–5.2)
Alkaline Phosphatase: 60 U/L (ref 39–117)
BUN: 41 mg/dL — ABNORMAL HIGH (ref 6–23)
CALCIUM: 12.5 mg/dL — AB (ref 8.4–10.5)
CO2: 26 meq/L (ref 19–32)
Chloride: 103 mEq/L (ref 96–112)
Creatinine, Ser: 1.64 mg/dL — ABNORMAL HIGH (ref 0.40–1.20)
GFR: 32.12 mL/min — AB (ref >60.00)
Glucose, Bld: 102 mg/dL — ABNORMAL HIGH (ref 70–99)
POTASSIUM: 3.7 meq/L (ref 3.5–5.1)
Sodium: 142 mEq/L (ref 135–145)
Total Bilirubin: 0.2 mg/dL (ref 0.2–1.2)
Total Protein: 7.5 g/dL (ref 6.0–8.3)

## 2018-01-27 LAB — PTH, INTACT AND CALCIUM
Calcium: 12.2 mg/dL — ABNORMAL HIGH (ref 8.6–10.4)
PTH: 89 pg/mL — ABNORMAL HIGH (ref 14–64)

## 2018-01-27 LAB — MAGNESIUM: MAGNESIUM: 1.8 mg/dL (ref 1.5–2.5)

## 2018-01-30 ENCOUNTER — Encounter: Payer: Self-pay | Admitting: Internal Medicine

## 2018-01-30 ENCOUNTER — Other Ambulatory Visit: Payer: Medicare Other

## 2018-01-30 ENCOUNTER — Other Ambulatory Visit: Payer: Self-pay | Admitting: Internal Medicine

## 2018-01-30 DIAGNOSIS — E213 Hyperparathyroidism, unspecified: Secondary | ICD-10-CM

## 2018-01-30 DIAGNOSIS — N189 Chronic kidney disease, unspecified: Secondary | ICD-10-CM

## 2018-01-30 LAB — PROTEIN ELECTROPHORESIS, SERUM
ALPHA 1: 0.4 g/dL — AB (ref 0.2–0.3)
Albumin ELP: 3.7 g/dL — ABNORMAL LOW (ref 3.8–4.8)
Alpha 2: 0.9 g/dL (ref 0.5–0.9)
BETA 2: 0.5 g/dL (ref 0.2–0.5)
Beta Globulin: 0.5 g/dL (ref 0.4–0.6)
Gamma Globulin: 1 g/dL (ref 0.8–1.7)
Total Protein: 6.9 g/dL (ref 6.1–8.1)

## 2018-01-31 LAB — PROTEIN ELECTROPHORESIS, URINE REFLEX
ALBUMIN ELP UR: 27.2 %
ALPHA-1-GLOBULIN, U: 3.3 %
ALPHA-2-GLOBULIN, U: 11.4 %
Beta Globulin, U: 33.1 %
Gamma Globulin, U: 25 %
PROTEIN UR: 8.6 mg/dL

## 2018-02-04 ENCOUNTER — Other Ambulatory Visit: Payer: Self-pay | Admitting: Internal Medicine

## 2018-02-10 ENCOUNTER — Other Ambulatory Visit (INDEPENDENT_AMBULATORY_CARE_PROVIDER_SITE_OTHER): Payer: Medicare Other

## 2018-02-10 ENCOUNTER — Other Ambulatory Visit: Payer: Self-pay | Admitting: Internal Medicine

## 2018-02-10 ENCOUNTER — Other Ambulatory Visit: Payer: Self-pay | Admitting: Emergency Medicine

## 2018-02-10 DIAGNOSIS — N189 Chronic kidney disease, unspecified: Secondary | ICD-10-CM

## 2018-02-10 LAB — COMPREHENSIVE METABOLIC PANEL
ALK PHOS: 53 U/L (ref 39–117)
ALT: 12 U/L (ref 0–35)
AST: 17 U/L (ref 0–37)
Albumin: 3.7 g/dL (ref 3.5–5.2)
BILIRUBIN TOTAL: 0.3 mg/dL (ref 0.2–1.2)
BUN: 25 mg/dL — AB (ref 6–23)
CO2: 24 mEq/L (ref 19–32)
CREATININE: 1.15 mg/dL (ref 0.40–1.20)
Calcium: 10.6 mg/dL — ABNORMAL HIGH (ref 8.4–10.5)
Chloride: 112 mEq/L (ref 96–112)
GFR: 48.38 mL/min — AB (ref >60.00)
GLUCOSE: 99 mg/dL (ref 70–99)
Potassium: 4.1 mEq/L (ref 3.5–5.1)
SODIUM: 143 meq/L (ref 135–145)
TOTAL PROTEIN: 6.8 g/dL (ref 6.0–8.3)

## 2018-02-10 LAB — PHOSPHORUS: Phosphorus: 2.7 mg/dL (ref 2.3–4.6)

## 2018-02-10 LAB — VITAMIN D 25 HYDROXY (VIT D DEFICIENCY, FRACTURES): VITD: 54.05 ng/mL (ref 30.00–100.00)

## 2018-02-12 ENCOUNTER — Encounter: Payer: Self-pay | Admitting: Internal Medicine

## 2018-02-14 ENCOUNTER — Other Ambulatory Visit: Payer: Self-pay | Admitting: Nephrology

## 2018-02-14 DIAGNOSIS — N183 Chronic kidney disease, stage 3 unspecified: Secondary | ICD-10-CM

## 2018-02-14 DIAGNOSIS — I129 Hypertensive chronic kidney disease with stage 1 through stage 4 chronic kidney disease, or unspecified chronic kidney disease: Secondary | ICD-10-CM

## 2018-02-21 ENCOUNTER — Encounter: Payer: Self-pay | Admitting: Internal Medicine

## 2018-02-21 ENCOUNTER — Ambulatory Visit (INDEPENDENT_AMBULATORY_CARE_PROVIDER_SITE_OTHER)
Admission: RE | Admit: 2018-02-21 | Discharge: 2018-02-21 | Disposition: A | Discharge status: Home / Self Care | Payer: Medicare Other | Source: Ambulatory Visit | Attending: Internal Medicine | Admitting: Internal Medicine

## 2018-02-21 ENCOUNTER — Ambulatory Visit: Payer: Medicare Other | Admitting: Internal Medicine

## 2018-02-21 VITALS — BP 170/84 | HR 63 | Temp 98.1°F | Resp 16 | Wt 156.0 lb

## 2018-02-21 DIAGNOSIS — J181 Lobar pneumonia, unspecified organism: Secondary | ICD-10-CM | POA: Diagnosis not present

## 2018-02-21 DIAGNOSIS — J189 Pneumonia, unspecified organism: Secondary | ICD-10-CM | POA: Insufficient documentation

## 2018-02-21 DIAGNOSIS — N189 Chronic kidney disease, unspecified: Secondary | ICD-10-CM

## 2018-02-21 DIAGNOSIS — I10 Essential (primary) hypertension: Secondary | ICD-10-CM

## 2018-02-21 NOTE — Assessment & Plan Note (Signed)
Elevated Ca and PTH - mom and sister have hyperparathyroidism Referral ordered for endo

## 2018-02-21 NOTE — Patient Instructions (Addendum)
Have a chest x-ray today.   Medications reviewed and updated. No changes recommended at this time.    Please followup in 3 months

## 2018-02-21 NOTE — Assessment & Plan Note (Signed)
Symptoms resolved Will check f/u cxr - ordered

## 2018-02-21 NOTE — Progress Notes (Signed)
Subjective:    Patient ID: Joy Martin, female    DOB: 1941/04/23, 77 y.o.   MRN: [id number]  HPI The patient is here for follow up.  Chronic kidney disease, hypercalcemia: She did go to [place].  He is not taking any NSAIDs.  She is drinking plenty of fluids throughout the day.  We did stop her hydrochlorothiazide.  Her kidney function and calcium improved.    Hypertension: She is taking her medication daily. She is compliant with a low sodium diet.  She had mild edema on a couple of occasions.  She denies chest pain, palpitations, shortness of breath and regular headaches.   She does not monitor her blood pressure at home.    Pneumonia: She did complete the antibiotics.  The chest x-ray on 3/21 did show a right lower lung pneumonia.  She also had COPD changes.  She has a occasional minimal cough now.  She denies any fever, chills, sob or wheeze.    Medications and allergies reviewed with patient and updated if appropriate.  Patient Active Problem List   Diagnosis Date Noted  . Community acquired pneumonia 02/21/2018  . Hypercalcemia 02/21/2018  . Neuralgia 01/16/2018  . Neck pain 01/16/2018  . Prediabetes 07/27/2017  . Cough 07/27/2017  . Kyphosis 05/04/2017  . Chronic shoulder bursitis, left 02/15/2017  . 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 Migraine headache 01/26/2017  . Back pain 01/26/2017  . Chronic kidney disease 01/26/2017  . Hypertensive retinopathy 11/29/2016  . Osteoarthritis of right knee 07/28/2016  . Basal cell carcinoma of skin 11/26/2015  . Abnormal CXR 01/27/2013  . Postmenopausal symptoms   . Obesity 12/25/2009  . Essential hypertension, benign 12/24/2008  . ALLERGIC RHINITIS, CHRONIC 12/24/2008  . Hyperlipidemia 12/19/2007  . ANEMIA 12/19/2007  . GERD 12/19/2007  . Osteopenia 12/19/2007    Current Outpatient Medications on File Prior to Visit  Medication Sig Dispense Refill  . amLODipine (NORVASC) 5 MG tablet Take  1 tablet (5 mg total) by mouth daily. 90 tablet 3  . ciclopirox (LOPROX) 0.77 % cream Apply 1 application topically 2 (two) times daily as needed. rash    . cycloSPORINE (RESTASIS) 0.05 % ophthalmic emulsion 1 drop 2 (two) times daily.      Marland Kitchen esomeprazole (NEXIUM) 40 MG capsule Take 40 mg by mouth every other day.    . famotidine (PEPCID) 40 MG tablet Take 1 tablet (40 mg total) by mouth daily. 30 tablet 5  . Flaxseed, Linseed, (FLAXSEED OIL PO) Take 2 tablets by mouth daily.    . fluticasone (FLONASE) 50 MCG/ACT nasal spray PLACE 1 SPRAY INTO BOTH NOSTRILS DAILY. 16 g 1  . gabapentin (NEURONTIN) 100 MG capsule Take 100 mg at night, if tolerated increase to 200 mg at night 60 capsule 3  . nadolol (CORGARD) 40 MG tablet TAKE 1 TABLET BY MOUTH TWICE A DAY 180 tablet 1  . simvastatin (ZOCOR) 40 MG tablet Take 1 tablet (40 mg total) by mouth at bedtime. 90 tablet 3  . topiramate (TOPAMAX) 50 MG tablet Take 1 tablet (50 mg total) by mouth 2 (two) times daily. 180 tablet 4  . valsartan (DIOVAN) 320 MG tablet Take 1 tablet (320 mg total) by mouth daily. 30 tablet 5  . Calcium Carbonate (CALCIUM 500 PO) Take by mouth.      . cholecalciferol (VITAMIN D) 1000 UNITS tablet Take 1,000 Units by mouth daily.    . Multiple Vitamins-Minerals (CENTRUM SILVER PO) Take 1 tablet  by mouth daily.     No current facility-administered medications on file prior to visit.     Past Medical History:  Diagnosis Date  . ALLERGIC RHINITIS, CHRONIC   . ANEMIA   . Arthritis    "knees mainly; thumbs"  . 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 Chronic sinusitis    follows with ENT for same  . COPD (chronic obstructive pulmonary disease) ([place]) dx 2013   GOLD 1, follows with pulm for same  . Episodic tension type headache   . EXOGENOUS OBESITY   . GERD   . HYPERLIPIDEMIA   . Hypertension   . Migraines 07/10/2012   "over; last one was @ age 27"  . OSTEOPENIA      Past Surgical History:  Procedure Laterality Date  . APPENDECTOMY  07/10/2012  . BREAST CYST ASPIRATION  ?1980's   right  . EYE SURGERY     both eyes  . KNEE ARTHROSCOPY  1970's or 1980's   right; torn cartilage  . LAPAROSCOPIC APPENDECTOMY  07/10/2012   Procedure: APPENDECTOMY LAPAROSCOPIC;  Surgeon: Harl Bowie, MD;  Location: [facility];  Service: General;  Laterality: N/A;  . MOHS SURGERY  01/2016   nose  . MOLE REMOVAL  1957   "my back"  . SKIN CANCER EXCISION     "2 on my back; 2 on my face"  . TONSILLECTOMY AND ADENOIDECTOMY  1946  . [place] EXTRACTION  1959    Social History   Socioeconomic History  . Marital status: Single    Spouse name: Not on file  . Number of children: 0  . Years of education: 44  . Highest education level: Not on file  Occupational History    Comment: retired Pharmacist, hospital, 5th grade  Social Needs  . Financial resource strain: Not on file  . Food insecurity:    Worry: Not on file    Inability: Not on file  . Transportation needs:    Medical: Not on file    Non-medical: Not on file  Tobacco Use  . Smoking status: Former Smoker    Packs/day: 0.75    Years: 20.00    Pack years: 15.00    Types: Cigarettes    Last attempt to quit: 07/25/1982    Years since quitting: 35.6  . Smokeless tobacco: Never Used  . Tobacco comment: 07/10/2012 "stopped smoking cigarettes 1980's"  Substance and Sexual Activity  . Alcohol use: Yes    Comment: 07/10/2012 "glass of wine 1-2X/yr"  . Drug use: No  . Sexual activity: Never  Lifestyle  . Physical activity:    Days per week: Not on file    Minutes per session: Not on file  . Stress: Not on file  Relationships  . Social connections:    Talks on phone: Not on file    Gets together: Not on file    Attends religious service: Not on file    Active member of club or organization: Not on file    Attends meetings of clubs or organizations: Not on file    Relationship status: Not on file  Other Topics  Concern  . 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 Prostate cancer Father   . Cancer Father  prostate  . Cancer Mother        waldenstruns microgobular anemia  . Alcohol abuse Other   . Heart disease Other   . Hypertension Other   . Diabetes Other   . Pancreatic cancer Other   . Cancer Maternal Grandfather        pancreatic    Review of Systems  Constitutional: Negative for chills and fever.  Respiratory: Positive for cough (occ dry cough) and shortness of breath (occ SOB, chronic, no change). Negative for wheezing.   Cardiovascular: Positive for leg swelling. Negative for chest pain and palpitations.  Neurological: Negative for light-headedness and headaches.       Objective:   Vitals:   02/21/18 1402  BP: (!) 170/84  Pulse: 63  Resp: 16  Temp: 98.1 F (36.7 C)  SpO2: 98%   BP Readings from Last 3 Encounters:  02/21/18 (!) 170/84  01/25/18 114/76  01/16/18 110/82   Wt Readings from Last 3 Encounters:  02/21/18 156 lb (70.8 kg)  01/25/18 152 lb (68.9 kg)  01/16/18 153 lb (69.4 kg)   Body mass index is 29.48 kg/m.   Physical Exam    Constitutional: Appears well-developed and well-nourished. No distress.  HENT:  Head: Normocephalic and atraumatic.  Neck: Neck supple. No tracheal deviation present. No thyromegaly present.  No cervical lymphadenopathy Cardiovascular: Normal rate, regular rhythm and normal heart sounds.   No murmur heard. No carotid bruit .  No edema Pulmonary/Chest: Effort normal and breath sounds normal. No respiratory distress. No has no wheezes. No rales.  Skin: Skin is warm and dry. Not diaphoretic.  Psychiatric: Normal mood and affect. Behavior is normal.      Assessment & Plan:    See Problem List for Assessment and Plan of chronic medical problems.

## 2018-02-21 NOTE — Assessment & Plan Note (Signed)
Improved with d/c'ing hctz Has seen nephrology Continue increased water intake, avoid nsiads Will follow up with [facility]

## 2018-02-21 NOTE — Assessment & Plan Note (Signed)
BP elevated here, but was better at [place] and previously Advised her to try to check it or come back for a nurse visit Continue current medications

## 2018-02-22 ENCOUNTER — Ambulatory Visit
Admission: RE | Admit: 2018-02-22 | Discharge: 2018-02-22 | Disposition: A | Discharge status: Home / Self Care | Payer: Medicare Other | Source: Ambulatory Visit | Attending: Nephrology | Admitting: Nephrology

## 2018-02-22 DIAGNOSIS — N183 Chronic kidney disease, stage 3 unspecified: Secondary | ICD-10-CM

## 2018-02-22 DIAGNOSIS — I129 Hypertensive chronic kidney disease with stage 1 through stage 4 chronic kidney disease, or unspecified chronic kidney disease: Secondary | ICD-10-CM

## 2018-03-02 ENCOUNTER — Telehealth: Payer: Self-pay | Admitting: Emergency Medicine

## 2018-03-02 NOTE — Telephone Encounter (Signed)
Called patient to schedule AWV. Spoke to patient who declined at this time due to health issues.

## 2018-03-05 ENCOUNTER — Encounter: Payer: Self-pay | Admitting: Internal Medicine

## 2018-03-08 ENCOUNTER — Ambulatory Visit: Payer: Medicare Other | Admitting: Family Medicine

## 2018-03-10 ENCOUNTER — Other Ambulatory Visit: Payer: Self-pay | Admitting: Internal Medicine

## 2018-03-17 ENCOUNTER — Other Ambulatory Visit: Payer: Self-pay | Admitting: Emergency Medicine

## 2018-03-17 MED ORDER — FLUTICASONE PROPIONATE 50 MCG/ACT NA SUSP: NASAL | 1 refills | Status: DC

## 2018-03-19 ENCOUNTER — Encounter: Payer: Self-pay | Admitting: Internal Medicine

## 2018-03-20 NOTE — Progress Notes (Signed)
Subjective:    Patient ID: Joy Martin, female    DOB: 05/09/1941, 77 y.o.   MRN: [id number]  HPI The patient is here for an acute visit.  Cough:  It started just after her chest xray in April. ( at least three weeks ago) It started after exposure to pollen.  It is productive more that it is not.  The sputum is yellow or gray yellow.  She does have some shortness of breath.  She has some runny nose and only noticed postnasal drip once.  She has had an irritated throat at times.  She has had some headaches.  She denies any fever, nasal congestion, ear pain, sinus pain, sore throat, wheeze.  She continues to have a variable appetite and fatigue, but these are not new with the cold symptoms.  She has not taking anything.    She has had problems with pollen over the past 5 years only.  She uses the flonase as needed.    Medications and allergies reviewed with patient and updated if appropriate.  Patient Active Problem List   Diagnosis Date Noted  . Community acquired pneumonia 02/21/2018  . Hypercalcemia 02/21/2018  . Neuralgia 01/16/2018  . Neck pain 01/16/2018  . Prediabetes 07/27/2017  . Cough 07/27/2017  . Kyphosis 05/04/2017  . Chronic shoulder bursitis, left 02/15/2017  . 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 Migraine headache 01/26/2017  . Back pain 01/26/2017  . Chronic kidney disease 01/26/2017  . Hypertensive retinopathy 11/29/2016  . Osteoarthritis of right knee 07/28/2016  . Basal cell carcinoma of skin 11/26/2015  . Abnormal CXR 01/27/2013  . Postmenopausal symptoms   . Obesity 12/25/2009  . Essential hypertension, benign 12/24/2008  . ALLERGIC RHINITIS, CHRONIC 12/24/2008  . Hyperlipidemia 12/19/2007  . ANEMIA 12/19/2007  . GERD 12/19/2007  . Osteopenia 12/19/2007    Current Outpatient Medications on File Prior to Visit  Medication Sig Dispense Refill  . amLODipine (NORVASC) 5 MG tablet Take 1 tablet (5 mg total) by mouth daily. 90 tablet 3    . Calcium Carbonate (CALCIUM 500 PO) Take by mouth.      . cholecalciferol (VITAMIN D) 1000 UNITS tablet Take 1,000 Units by mouth daily.    . ciclopirox (LOPROX) 0.77 % cream Apply 1 application topically 2 (two) times daily as needed. rash    . cycloSPORINE (RESTASIS) 0.05 % ophthalmic emulsion 1 drop 2 (two) times daily.      Marland Kitchen esomeprazole (NEXIUM) 40 MG capsule Take 40 mg by mouth every other day.    . famotidine (PEPCID) 40 MG tablet Take 1 tablet (40 mg total) by mouth daily. 30 tablet 5  . Flaxseed, Linseed, (FLAXSEED OIL PO) Take 2 tablets by mouth daily.    . fluticasone (FLONASE) 50 MCG/ACT nasal spray PLACE 1 SPRAY INTO BOTH NOSTRILS DAILY. 16 g 1  . gabapentin (NEURONTIN) 100 MG capsule Take 100 mg at night, if tolerated increase to 200 mg at night 60 capsule 3  . Multiple Vitamins-Minerals (CENTRUM SILVER PO) Take 1 tablet by mouth daily.    . nadolol (CORGARD) 40 MG tablet TAKE 1 TABLET BY MOUTH TWICE A DAY 180 tablet 1  . simvastatin (ZOCOR) 40 MG tablet Take 1 tablet (40 mg total) by mouth at bedtime. 90 tablet 3  . topiramate (TOPAMAX) 50 MG tablet Take 1 tablet (50 mg total) by mouth 2 (two) times daily. 180 tablet 4  . valsartan (DIOVAN) 320 MG tablet Take 1 tablet (320 mg total)  by mouth daily. 30 tablet 5   No current facility-administered medications on file prior to visit.     Past Medical History:  Diagnosis Date  . ALLERGIC RHINITIS, CHRONIC   . ANEMIA   . Arthritis    "knees mainly; thumbs"  . 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 Chronic sinusitis    follows with ENT for same  . COPD (chronic obstructive pulmonary disease) ([place]) dx 2013   GOLD 1, follows with pulm for same  . Episodic tension type headache   . EXOGENOUS OBESITY   . GERD   . HYPERLIPIDEMIA   . Hypertension   . Migraines 07/10/2012   "over; last one was @ age 38"  . OSTEOPENIA     Past Surgical History:  Procedure Laterality  Date  . APPENDECTOMY  07/10/2012  . BREAST CYST ASPIRATION  ?1980's   right  . EYE SURGERY     both eyes  . KNEE ARTHROSCOPY  1970's or 1980's   right; torn cartilage  . LAPAROSCOPIC APPENDECTOMY  07/10/2012   Procedure: APPENDECTOMY LAPAROSCOPIC;  Surgeon: Harl Bowie, MD;  Location: [facility];  Service: General;  Laterality: N/A;  . MOHS SURGERY  01/2016   nose  . MOLE REMOVAL  1957   "my back"  . SKIN CANCER EXCISION     "2 on my back; 2 on my face"  . TONSILLECTOMY AND ADENOIDECTOMY  1946  . [place] EXTRACTION  1959    Social History   Socioeconomic History  . Marital status: Single    Spouse name: Not on file  . Number of children: 0  . Years of education: 24  . Highest education level: Not on file  Occupational History    Comment: retired Pharmacist, hospital, 5th grade  Social Needs  . Financial resource strain: Not on file  . Food insecurity:    Worry: Not on file    Inability: Not on file  . Transportation needs:    Medical: Not on file    Non-medical: Not on file  Tobacco Use  . Smoking status: Former Smoker    Packs/day: 0.75    Years: 20.00    Pack years: 15.00    Types: Cigarettes    Last attempt to quit: 07/25/1982    Years since quitting: 35.6  . Smokeless tobacco: Never Used  . Tobacco comment: 07/10/2012 "stopped smoking cigarettes 1980's"  Substance and Sexual Activity  . Alcohol use: Yes    Comment: 07/10/2012 "glass of wine 1-2X/yr"  . Drug use: No  . Sexual activity: Never  Lifestyle  . Physical activity:    Days per week: Not on file    Minutes per session: Not on file  . Stress: Not on file  Relationships  . Social connections:    Talks on phone: Not on file    Gets together: Not on file    Attends religious service: Not on file    Active member of club or organization: Not on file    Attends meetings of clubs or organizations: Not on file    Relationship status: Not on file  Other Topics Concern  . 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 Prostate cancer Father   . Cancer Father  prostate  . Cancer Mother        waldenstruns microgobular anemia  . Alcohol abuse Other   . Heart disease Other   . Hypertension Other   . Diabetes Other   . Pancreatic cancer Other   . Cancer Maternal Grandfather        pancreatic    Review of Systems  Constitutional: Positive for appetite change (variable - unchanged) and fatigue. Negative for chills and fever.  HENT: Positive for postnasal drip (only once) and rhinorrhea. Negative for congestion, ear pain, sinus pressure, sinus pain and sore throat (occ irritated throat).   Eyes: Positive for discharge (saw eye doctor - uses clothes on eyes).  Respiratory: Positive for cough and shortness of breath. Negative for chest tightness and wheezing.   Cardiovascular: Negative for chest pain.  Gastrointestinal: Negative for diarrhea and nausea.       No GERD - controlled  Neurological: Positive for headaches. Negative for light-headedness.       Objective:   Vitals:   03/21/18 1041  BP: 132/88  Pulse: 74  Resp: 18  Temp: (!) 97.4 F (36.3 C)  SpO2: 94%   BP Readings from Last 3 Encounters:  03/21/18 132/88  02/21/18 (!) 170/84  01/25/18 114/76   Wt Readings from Last 3 Encounters:  03/21/18 147 lb (66.7 kg)  02/21/18 156 lb (70.8 kg)  01/25/18 152 lb (68.9 kg)   Body mass index is 27.78 kg/m.   Physical Exam    GENERAL APPEARANCE: Appears stated age, well appearing, NAD EYES: conjunctiva clear, no icterus HEENT: bilateral tympanic membranes and ear canals normal, oropharynx with no erythema, no thyromegaly, trachea midline, no cervical or supraclavicular lymphadenopathy LUNGS: Clear to auscultation without wheeze or crackles, unlabored breathing, good air entry bilaterally CARDIOVASCULAR: Normal S1,S2 without murmurs, no edema SKIN: Warm,  dry    Assessment & Plan:    See Problem List for Assessment and Plan of chronic medical problems.

## 2018-03-21 ENCOUNTER — Encounter: Payer: Self-pay | Admitting: Internal Medicine

## 2018-03-21 ENCOUNTER — Ambulatory Visit: Payer: Medicare Other | Admitting: Family Medicine

## 2018-03-21 ENCOUNTER — Ambulatory Visit: Payer: Medicare Other | Admitting: Internal Medicine

## 2018-03-21 ENCOUNTER — Ambulatory Visit (INDEPENDENT_AMBULATORY_CARE_PROVIDER_SITE_OTHER)
Admission: RE | Admit: 2018-03-21 | Discharge: 2018-03-21 | Disposition: A | Discharge status: Home / Self Care | Payer: Medicare Other | Source: Ambulatory Visit | Attending: Internal Medicine | Admitting: Internal Medicine

## 2018-03-21 VITALS — BP 132/88 | HR 74 | Temp 97.4°F | Resp 18 | Wt 147.0 lb

## 2018-03-21 DIAGNOSIS — J209 Acute bronchitis, unspecified: Secondary | ICD-10-CM | POA: Diagnosis not present

## 2018-03-21 MED ORDER — CEFDINIR 300 MG PO CAPS: 300.0000 mg | ORAL_CAPSULE | Freq: Two times a day (BID) | ORAL | 0 refills | Status: DC

## 2018-03-21 MED ORDER — HYDROCODONE-HOMATROPINE 5-1.5 MG/5ML PO SYRP: 5.0000 mL | ORAL_SOLUTION | Freq: Three times a day (TID) | ORAL | 0 refills | Status: DC | PRN

## 2018-03-21 NOTE — Patient Instructions (Signed)
Have a chest x-ray today.  We will call you with the results.   An antibiotic and cough syrup was sent to your pharmacy.  Take as prescribed.   Start using the Flonase daily.    Increase your fluid intake.   Call if no improvement

## 2018-03-21 NOTE — Assessment & Plan Note (Signed)
Symptoms consistent with bacterial bronchitis She did have recent pneumonia and need to rule out pneumonia again Chest x-ray today Start Ceftin of ear twice daily x10 days Hycodan cough syrup as needed-she is aware that this can make her drowsy Start Flonase daily-there may be an element of allergies Rest, fluids Call if no improvement

## 2018-03-28 ENCOUNTER — Ambulatory Visit: Payer: Medicare Other | Admitting: Internal Medicine

## 2018-04-06 ENCOUNTER — Ambulatory Visit: Payer: Medicare Other | Admitting: Endocrinology

## 2018-04-06 ENCOUNTER — Encounter: Payer: Self-pay | Admitting: Endocrinology

## 2018-04-06 DIAGNOSIS — Z8349 Family history of other endocrine, nutritional and metabolic diseases: Secondary | ICD-10-CM | POA: Diagnosis not present

## 2018-04-06 NOTE — Progress Notes (Signed)
Subjective:    Patient ID: Joy Martin, female    DOB: [birth year], 77 y.o.   MRN: [id number]  HPI Pt is referred by Dr Quay Burow, for hypercalcemia.  Pt was noted to have moderate hypercalcemia in 2012 (it was normal in 2011). she has never had urolithiasis, thyroid probs, sarcoidosis, cancer, PUD, pancreatitis, depression, or bony fracture.  she does not take vitamin- A supplement.  Pt denies taking antacids, Li++, or HCTZ.  She has been found to have osteoporosis, for which she took fosamax.  She stopped vit-D supplement in early 2019.  HCTZ was stopped in early 2019.  She has slight cough and assoc fatigue.   Past Medical History:  Diagnosis Date  . ALLERGIC RHINITIS, CHRONIC   . ANEMIA   . Arthritis    "knees mainly; thumbs"  . 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 Chronic sinusitis    follows with ENT for same  . COPD (chronic obstructive pulmonary disease) ([place]) dx 2013   GOLD 1, follows with pulm for same  . Episodic tension type headache   . EXOGENOUS OBESITY   . GERD   . HYPERLIPIDEMIA   . Hypertension   . Migraines 07/10/2012   "over; last one was @ age 34"  . OSTEOPENIA     Past Surgical History:  Procedure Laterality Date  . APPENDECTOMY  07/10/2012  . BREAST CYST ASPIRATION  ?1980's   right  . EYE SURGERY     both eyes  . KNEE ARTHROSCOPY  1970's or 1980's   right; torn cartilage  . LAPAROSCOPIC APPENDECTOMY  07/10/2012   Procedure: APPENDECTOMY LAPAROSCOPIC;  Surgeon: Harl Bowie, MD;  Location: [facility];  Service: General;  Laterality: N/A;  . MOHS SURGERY  01/2016   nose  . MOLE REMOVAL  1957   "my back"  . SKIN CANCER EXCISION     "2 on my back; 2 on my face"  . TONSILLECTOMY AND ADENOIDECTOMY  1946  . [place] EXTRACTION  1959    Social History   Socioeconomic History  . Marital status: Single    Spouse name: Not on file  . Number of children: 0  . Years of education: 83  . Highest  education level: Not on file  Occupational History    Comment: retired Pharmacist, hospital, 5th grade  Social Needs  . Financial resource strain: Not on file  . Food insecurity:    Worry: Not on file    Inability: Not on file  . Transportation needs:    Medical: Not on file    Non-medical: Not on file  Tobacco Use  . Smoking status: Former Smoker    Packs/day: 0.75    Years: 20.00    Pack years: 15.00    Types: Cigarettes    Last attempt to quit: 07/25/1982    Years since quitting: 35.7  . Smokeless tobacco: Never Used  . Tobacco comment: 07/10/2012 "stopped smoking cigarettes 1980's"  Substance and Sexual Activity  . Alcohol use: Yes    Comment: 07/10/2012 "glass of wine 1-2X/yr"  . Drug use: No  . Sexual activity: Never  Lifestyle  . Physical activity:    Days per week: Not on file    Minutes per session: Not on file  . Stress: Not on file  Relationships  . Social connections:    Talks on phone: Not on file    Gets together: Not on file  Attends religious service: Not on file    Active member of club or organization: Not on file    Attends meetings of clubs or organizations: Not on file    Relationship status: Not on file  . Intimate partner violence:    Fear of current or ex partner: Not on file    Emotionally abused: Not on file    Physically abused: Not on file    Forced sexual activity: Not on file  Other Topics Concern  . 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 amLODipine (NORVASC) 5 MG tablet Take 1 tablet (5 mg total) by mouth daily. 90 tablet 3  . ciclopirox (LOPROX) 0.77 % cream Apply 1 application topically 2 (two) times daily as needed. rash    . cycloSPORINE (RESTASIS) 0.05 % ophthalmic emulsion 1 drop 2 (two) times daily.      Marland Kitchen esomeprazole (NEXIUM) 40 MG capsule Take 40 mg by mouth every other day.     . famotidine (PEPCID) 40 MG tablet Take 1 tablet (40 mg total) by mouth daily. 30 tablet 5  . Flaxseed, Linseed, (FLAXSEED OIL PO) Take 2 tablets by mouth daily.    . fluticasone (FLONASE) 50 MCG/ACT nasal spray PLACE 1 SPRAY INTO BOTH NOSTRILS DAILY. 16 g 1  . nadolol (CORGARD) 40 MG tablet TAKE 1 TABLET BY MOUTH TWICE A DAY 180 tablet 1  . simvastatin (ZOCOR) 40 MG tablet Take 1 tablet (40 mg total) by mouth at bedtime. 90 tablet 3  . topiramate (TOPAMAX) 50 MG tablet Take 1 tablet (50 mg total) by mouth 2 (two) times daily. 180 tablet 4  . valsartan (DIOVAN) 320 MG tablet Take 1 tablet (320 mg total) by mouth daily. 30 tablet 5   No current facility-administered medications on file prior to visit.     Allergies  Allergen Reactions  . Allegra [Fexofenadine Hcl] Other (See Comments)    Headache, "took the pill; I got flu symptoms"  . Prilosec [Omeprazole]     Fecal incontince  . Ranitidine     Fecal incontince    Family History  Problem Relation Age of Onset  . Prostate cancer Father   . Cancer Father        prostate  . Cancer Mother        waldenstruns microgobular anemia  . Hyperparathyroidism Mother   . Alcohol abuse Other   . Heart disease Other   . Hypertension Other   . Diabetes Other   . Pancreatic cancer Other   . Hyperparathyroidism Sister   . Cancer Maternal Grandfather        pancreatic    BP (!) 172/80 (BP Location: Left Arm, Patient Position: Sitting, Cuff Size: Normal)   Pulse 60   Ht 5\' 1"  (1.549 m)   Wt 147 lb (66.7 kg)   SpO2 96%   BMI 27.78 kg/m     Review of Systems Denies visual loss, edema, diarrhea, sore throat, polyuria, hematuria, rash, depression, numbness.  She has back pain.  She has lost 10 lbs x a few months.     Objective:   Physical Exam VS: see vs page GEN: no distress HEAD: head: no deformity eyes: no periorbital swelling, no proptosis external nose and ears are normal mouth: no lesion seen NECK: supple, thyroid is not  enlarged CHEST WALL: no deformity, except for kyphosis. LUNGS: clear to auscultation CV: reg rate and rhythm, no murmur ABD: abdomen is soft, nontender.  no hepatosplenomegaly.  not distended.  no hernia MUSCULOSKELETAL: muscle bulk and strength are grossly normal.  no obvious joint swelling.  gait is normal and steady EXTEMITIES: no deformity.  no edema PULSES: no carotid bruit NEURO:  cn 2-12 grossly intact.   readily moves all 4's.  sensation is intact to touch on all 4's SKIN:  Normal texture and temperature.  No rash or suspicious lesion is visible.   NODES:  None palpable at the neck PSYCH: alert, well-oriented.  Does not appear anxious nor depressed.  CXR: Exaggerated thoracic kyphosis  Lab Results  Component Value Date   PTH 89 (H) 01/26/2018   CALCIUM 10.6 (H) 02/10/2018   PHOS 2.7 02/10/2018   Lab Results  Component Value Date   ALT 12 02/10/2018   AST 17 02/10/2018   ALKPHOS 53 02/10/2018   BILITOT 0.3 02/10/2018       Assessment & Plan:  Hypercalcemia: new to me: prob mild primary hyperparathyroidism. Pos FHx: this raises the possibility of [place], but we'll follow for now.   Patient Instructions  blood tests are requested for you today.  We'll let you know about the results.  If it is borderline abnormal, please come back for a follow-up appointment in 6 months You should continue to have the bone density monitored every 2 years.

## 2018-04-06 NOTE — Patient Instructions (Addendum)
blood tests are requested for you today.  We'll let you know about the results.  If it is borderline abnormal, please come back for a follow-up appointment in 6 months You should continue to have the bone density monitored every 2 years.

## 2018-04-07 LAB — VITAMIN D 25 HYDROXY (VIT D DEFICIENCY, FRACTURES): VITD: 48.34 ng/mL (ref 30.00–100.00)

## 2018-04-14 LAB — PROTEIN ELECTROPHORESIS, SERUM
ALPHA 1: 0.4 g/dL — AB (ref 0.2–0.3)
Albumin ELP: 3.5 g/dL — ABNORMAL LOW (ref 3.8–4.8)
Alpha 2: 0.8 g/dL (ref 0.5–0.9)
BETA GLOBULIN: 0.5 g/dL (ref 0.4–0.6)
Beta 2: 0.5 g/dL (ref 0.2–0.5)
GAMMA GLOBULIN: 1.1 g/dL (ref 0.8–1.7)
Total Protein: 6.8 g/dL (ref 6.1–8.1)

## 2018-04-14 LAB — VITAMIN A: Vitamin A (Retinoic Acid): 69 ug/dL (ref 38–98)

## 2018-04-14 LAB — PTH, INTACT AND CALCIUM
Calcium: 10.9 mg/dL — ABNORMAL HIGH (ref 8.6–10.4)
PTH: 129 pg/mL — ABNORMAL HIGH (ref 14–64)

## 2018-04-14 LAB — VITAMIN D 1,25 DIHYDROXY
VITAMIN D 1, 25 (OH) TOTAL: 45 pg/mL (ref 18–72)
Vitamin D2 1, 25 (OH)2: <8 pg/mL
Vitamin D3 1, 25 (OH)2: 45 pg/mL

## 2018-04-14 LAB — PTH-RELATED PEPTIDE: PTH-Related Protein (PTH-RP): 14 pg/mL (ref 14–27)

## 2018-04-15 ENCOUNTER — Other Ambulatory Visit: Payer: Self-pay | Admitting: Diagnostic Neuroimaging

## 2018-04-16 ENCOUNTER — Encounter: Payer: Self-pay | Admitting: Endocrinology

## 2018-04-18 ENCOUNTER — Ambulatory Visit: Payer: Medicare Other | Admitting: Diagnostic Neuroimaging

## 2018-04-18 ENCOUNTER — Encounter: Payer: Self-pay | Admitting: Diagnostic Neuroimaging

## 2018-04-18 VITALS — BP 113/72 | HR 62 | Ht 61.0 in | Wt 146.0 lb

## 2018-04-18 DIAGNOSIS — G4440 Drug-induced headache, not elsewhere classified, not intractable: Secondary | ICD-10-CM

## 2018-04-18 DIAGNOSIS — T3995XA Adverse effect of unspecified nonopioid analgesic, antipyretic and antirheumatic, initial encounter: Secondary | ICD-10-CM

## 2018-04-18 DIAGNOSIS — G43109 Migraine with aura, not intractable, without status migrainosus: Secondary | ICD-10-CM | POA: Diagnosis not present

## 2018-04-18 MED ORDER — TOPIRAMATE 50 MG PO TABS: 50.0000 mg | ORAL_TABLET | Freq: Two times a day (BID) | ORAL | 4 refills | Status: DC

## 2018-04-18 MED ORDER — RIZATRIPTAN BENZOATE 10 MG PO TBDP: 10.0000 mg | ORAL_TABLET | ORAL | 11 refills | Status: DC | PRN

## 2018-04-18 MED ORDER — ERENUMAB-AOOE 70 MG/ML ~~LOC~~ SOAJ: 70.0000 mg | SUBCUTANEOUS | 3 refills | Status: DC

## 2018-04-18 NOTE — Patient Instructions (Signed)
  MIGRAINE PREVENTION  - continue topiramate 50mg  twice a day (drink plenty of water; monitor for side effects) - add aimovig pen injection once per month  MIGRAINE RESCUE - start rizatriptan 10mg  as needed for breakthrough headache; may repeat x 1 after 2 hours; max 2 tabs per day or 8 per month - reduce excedrin OTC over time; goal would be to use no more than 5-10 tabs per month

## 2018-04-18 NOTE — Progress Notes (Signed)
[facility]  PATIENT: Joy Martin DOB: 1941/08/07  REFERRING CLINICIAN: S Burns HISTORY FROM: patient  REASON FOR VISIT: follow up   HISTORICAL  CHIEF COMPLAINT:  Chief Complaint  Patient presents with  . Migraine    rm 7, " lots of headaches but have had lots of medical issues this year which bring on headache; Excedrin as needed works- taking it daily x 1-2 tabs  . Follow-up    HISTORY OF PRESENT ILLNESS:   UPDATE (04/18/18, VRP): Since last visit, doing about the same. Tolerating meds. Still with daily headaches (right parietal soreness and burned sensation, sometimes pounding) and daily Excedrin usage. Left shoulder pain improving. No alleviating or aggravating factors.   UPDATE 04/12/17: Since last visit, HA are improving. On TPX 50mg  twice a day. Continues on excedrin 2-4 tabs per day. Now with more left shoulder pain, radiating to neck and head. Seeing sports medicine clinic and trying home exercises.  UPDATE 12/07/16: Since last visit visit, continues with daily headaches. Has slightly reduced excedrin to 2-4 tabs per day. Tolerating TPX 25mg  daily.   PRIOR HPI (08/27/16): 77 year old right-handed female here for evaluation of headaches. Around age 77 years old patient had onset of right-sided headaches with right eye pain, throbbing, nausea, proceeded by visual disturbance and aura. Patient was diagnosed with migraine headaches. During factors include menstrual cycle and red wine. She averaged 2-3 headaches per month each lasting 1-2 days. Patient was evaluated by the headache clinic in the past and tried nadolol, Imitrex, over-the-counter medications. By age 77 years old patient was going through menopause and her migraine headaches subsided. Over many years patient has also had lower grade dull sore and intermittent stabbing headaches sometimes on the right side, sometimes on the top of her head. No other associated factors. These been going on for at  least 10-20 years. Patient has gradually built up to using 2-6 tablets of Excedrin over-the-counter on a daily basis. This is been going on for many many years. Patient has tried weaning off in the past but this caused rebound headaches. Patient also was taking Fioricet 20 tablets per month for many years. Patient now establish with a new PCP who is reviewing medication list and advising patient to reduce medication overusage which may be triggering analgesic overuse headache and rebound headache. Patient referred to me for further evaluation consideration of prophylactic headache therapy.   REVIEW OF SYSTEMS: Full 14 system review of systems performed and negative with exception of: headache ringing in ears SOB neck pain.    ALLERGIES: Allergies  Allergen Reactions  . Allegra [Fexofenadine Hcl] Other (See Comments)    Headache, "took the pill; I got flu symptoms"  . Prilosec [Omeprazole]     Fecal incontince  . Ranitidine     Fecal incontince    HOME MEDICATIONS: Outpatient Medications Prior to Visit  Medication Sig Dispense Refill  . amLODipine (NORVASC) 5 MG tablet Take 1 tablet (5 mg total) by mouth daily. 90 tablet 3  . aspirin-acetaminophen-caffeine (EXCEDRIN MIGRAINE) 790-240-97 MG tablet Take by mouth every 6 (six) hours as needed for headache.    . ciclopirox (LOPROX) 0.77 % cream Apply 1 application topically 2 (two) times daily as needed. rash    . cycloSPORINE (RESTASIS) 0.05 % ophthalmic emulsion 1 drop 2 (two) times daily.      Marland Kitchen esomeprazole (NEXIUM) 40 MG capsule Take 40 mg by mouth every other day.    . famotidine (PEPCID) 40 MG tablet Take 1  tablet (40 mg total) by mouth daily. 30 tablet 5  . Flaxseed, Linseed, (FLAXSEED OIL PO) Take 2 tablets by mouth daily.    . fluticasone (FLONASE) 50 MCG/ACT nasal spray PLACE 1 SPRAY INTO BOTH NOSTRILS DAILY. 16 g 1  . nadolol (CORGARD) 40 MG tablet TAKE 1 TABLET BY MOUTH TWICE A DAY 180 tablet 1  . simvastatin (ZOCOR) 40 MG tablet  Take 1 tablet (40 mg total) by mouth at bedtime. 90 tablet 3  . topiramate (TOPAMAX) 50 MG tablet TAKE 1 TABLET TWICE A DAY 180 tablet 3  . valsartan (DIOVAN) 320 MG tablet Take 1 tablet (320 mg total) by mouth daily. 30 tablet 5   No facility-administered medications prior to visit.     PAST MEDICAL HISTORY: Past Medical History:  Diagnosis Date  . ALLERGIC RHINITIS, CHRONIC   . ANEMIA   . Arthritis    "knees mainly; thumbs"  . 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 Chronic sinusitis    follows with ENT for same  . CKD (chronic kidney disease) 2018  . COPD (chronic obstructive pulmonary disease) ([place]) dx 2013   GOLD 1, follows with pulm for same  . Episodic tension type headache   . EXOGENOUS OBESITY   . GERD   . HYPERLIPIDEMIA   . Hypertension   . Migraines 07/10/2012   "over; last one was @ age 30"  . OSTEOPENIA   . Pneumonia 2018-2019   x 2    PAST SURGICAL HISTORY: Past Surgical History:  Procedure Laterality Date  . APPENDECTOMY  07/10/2012  . BREAST CYST ASPIRATION  ?1980's   right  . EYE SURGERY     both eyes  . KNEE ARTHROSCOPY  1970's or 1980's   right; torn cartilage  . LAPAROSCOPIC APPENDECTOMY  07/10/2012   Procedure: APPENDECTOMY LAPAROSCOPIC;  Surgeon: Harl Bowie, MD;  Location: [facility];  Service: General;  Laterality: N/A;  . MOHS SURGERY  01/2016   nose  . MOLE REMOVAL  1957   "my back"  . SKIN CANCER EXCISION     "2 on my back; 2 on my face"  . TONSILLECTOMY AND ADENOIDECTOMY  1946  . WISDOM TOOTH EXTRACTION  1959    FAMILY HISTORY: Family History  Problem Relation Age of Onset  . Prostate cancer Father   . Cancer Father        prostate  . Cancer Mother        waldenstruns microgobular anemia  . Hyperparathyroidism Mother   . Alcohol abuse Other   . Heart disease Other   . Hypertension Other   . Diabetes Other   . Pancreatic cancer Other   . Hyperparathyroidism Sister   .  Cancer Maternal Grandfather        pancreatic    SOCIAL HISTORY:  Social History   Socioeconomic History  . Marital status: Single    Spouse name: Not on file  . Number of children: 0  . Years of education: 30  . Highest education level: Not on file  Occupational History    Comment: retired Pharmacist, hospital, 5th grade  Social Needs  . Financial resource strain: Not on file  . Food insecurity:    Worry: Not on file    Inability: Not on file  . Transportation needs:    Medical: Not on file    Non-medical: Not on file  Tobacco Use  . Smoking status: Former Smoker  Packs/day: 0.75    Years: 20.00    Pack years: 15.00    Types: Cigarettes    Last attempt to quit: 07/25/1982    Years since quitting: 35.7  . Smokeless tobacco: Never Used  . Tobacco comment: 07/10/2012 "stopped smoking cigarettes 1980's"  Substance and Sexual Activity  . Alcohol use: Yes    Comment: 07/10/2012 "glass of wine 1-2X/yr"  . Drug use: No  . Sexual activity: Never  Lifestyle  . Physical activity:    Days per week: Not on file    Minutes per session: Not on file  . Stress: Not on file  Relationships  . Social connections:    Talks on phone: Not on file    Gets together: Not on file    Attends religious service: Not on file    Active member of club or organization: Not on file    Attends meetings of clubs or organizations: Not on file    Relationship status: Not on file  . Intimate partner violence:    Fear of current or ex partner: Not on file    Emotionally abused: Not on file    Physically abused: Not on file    Forced sexual activity: Not on file  Other Topics Concern  . 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 No exam data  present  Patient is in no distress; well developed, nourished and groomed; neck is supple  CARDIOVASCULAR:  Examination of carotid arteries is normal; no carotid bruits  Regular rate and rhythm, no murmurs  Examination of peripheral vascular system by observation and palpation is normal  EYES:  Ophthalmoscopic exam of optic discs and posterior segments is normal; no papilledema or hemorrhages  MUSCULOSKELETAL:  Gait, strength, tone, movements noted in Neurologic exam below  NEUROLOGIC: MENTAL STATUS:  MMSE - Mini Mental State Exam 07/28/2016  Not completed: (No Data)    awake, alert, oriented to person, place and time  recent and remote memory intact  normal attention and concentration  language fluent, comprehension intact, naming intact,   fund of knowledge appropriate  CRANIAL NERVE:   2nd - no papilledema on fundoscopic exam  2nd, 3rd, 4th, 6th - pupils equal and reactive to light, visual fields full to confrontation, extraocular muscles intact, no nystagmus  5th - facial sensation symmetric  7th - facial strength symmetric  8th - hearing intact  9th - palate elevates symmetrically, uvula midline  11th - shoulder shrug symmetric  12th - tongue protrusion midline  MOTOR:   normal bulk and tone, full strength in the BUE, BLE  ABLE TO RAISE BOTH HANDS OVERHEAD  SENSORY:   normal and symmetric to light touch, temperature, vibration  COORDINATION:   finger-nose-finger, fine finger movements normal  REFLEXES:   deep tendon reflexes present and symmetric  GAIT/STATION:   narrow based gait    DIAGNOSTIC DATA (LABS, IMAGING, TESTING) - I reviewed patient records, labs, notes, testing and imaging myself where available.  Lab Results  Component Value Date   WBC 8.4 01/25/2018   HGB 12.8 01/25/2018   HCT 38.4 01/25/2018   MCV 95.4 01/25/2018   PLT 297.0 01/25/2018  Component Value Date/Time   NA 143 02/10/2018 1616   K 4.1  02/10/2018 1616   CL 112 02/10/2018 1616   CO2 24 02/10/2018 1616   GLUCOSE 99 02/10/2018 1616   BUN 25 (H) 02/10/2018 1616   CREATININE 1.15 02/10/2018 1616   CALCIUM 10.9 (H) 04/06/2018 1655   PROT 6.8 04/06/2018 1655   ALBUMIN 3.7 02/10/2018 1616   AST 17 02/10/2018 1616   ALT 12 02/10/2018 1616   ALKPHOS 53 02/10/2018 1616   BILITOT 0.3 02/10/2018 1616   GFRNONAA 53 (L) 07/10/2012 1120   GFRAA 61 (L) 07/10/2012 1120   Lab Results  Component Value Date   CHOL 162 07/27/2017   HDL 76.80 07/27/2017   LDLCALC 51 07/27/2017   LDLDIRECT 143.2 12/19/2007   TRIG 172.0 (H) 07/27/2017   CHOLHDL 2 07/27/2017   Lab Results  Component Value Date   HGBA1C 6.3 01/25/2018   No results found for: VITAMINB12 Lab Results  Component Value Date   TSH 1.87 01/26/2018    02/14/13 CT sinuses - Pansinusitis. Findings likely represent a combination of acute and chronic sinus disease.     ASSESSMENT AND PLAN  77 y.o. year old female here with history of migraine headaches since age 57 years old, with at least 10 years of low-grade chronic daily headache, tension headache, analgesic overuse headache.    Meds tried: topiramate (not effective), imitrex (not effective)  Cannot try propranolol (already on nadolol), amitriptyline (due to age).  Dx:  1. Migraine with aura and without status migrainosus, not intractable   2. Analgesic overuse headache   3. Analgesic rebound headache      PLAN:  MIGRAINE PREVENTION  - continue topiramate 50mg  twice a day (drink plenty of water; monitor for side effects) - add CGRP antagonist (aimovig)--> Cannot try propranolol (already on nadolol), amitriptyline (due to age)  MIGRAINE RESCUE - rizatriptan as needed - reduce excedrin OTC over time; goal would be to use no more than 5-10 tabs per month  Meds ordered this encounter  Medications  . topiramate (TOPAMAX) 50 MG tablet    Sig: Take 1 tablet (50 mg total) by mouth 2 (two) times daily.     Dispense:  180 tablet    Refill:  4  . Erenumab-aooe (AIMOVIG) 70 MG/ML SOAJ    Sig: Inject 70 mg into the skin every 30 (thirty) days.    Dispense:  3 pen    Refill:  3  . rizatriptan (MAXALT-MLT) 10 MG disintegrating tablet    Sig: Take 1 tablet (10 mg total) by mouth as needed for migraine. May repeat in 2 hours if needed    Dispense:  9 tablet    Refill:  11   Return in about 6 months (around 10/18/2018) for with NP Clabe Seal).    Penni Bombard, MD 02/04/5187, 41:66 AM Certified in Neurology, Neurophysiology and Neuroimaging  [facility] [address], [place], [place] [address] [phone]

## 2018-04-19 ENCOUNTER — Telehealth: Payer: Self-pay | Admitting: *Deleted

## 2018-04-19 NOTE — Telephone Encounter (Signed)
Started Aimovig PA on CMM, key [id number] Tried /failed: topiramate, Imitrex Contraindicated: Propanolol (pt on similar med), Amitriptyline (advanced age) [facility] is reviewing your PA request. Typically an electronic response will be received within 72 hours.

## 2018-04-19 NOTE — Telephone Encounter (Signed)
Received PA approval on CMM. Aimovig approved through 07/20/2018 under Medicare Part D benefit. PA [id number]  Called [facility], spoke with Jenny Reichmann who stated they already got that message. Patient's cost is $128 for 3 month supply.

## 2018-05-06 ENCOUNTER — Encounter: Payer: Self-pay | Admitting: Endocrinology

## 2018-05-21 NOTE — Progress Notes (Signed)
Subjective:    Patient ID: Joy Martin, female    DOB: 1941-03-13, 77 y.o.   MRN: [id number]  HPI The patient is here for follow up.  She had pneumonia two months ago and feels like she is much better, but still not 100%.  Appetite is normal.  Her energy level has improved and continues to improve.  Her stamina is also improving.  She continues to have an occasional cough that is primarily dry.  She has chronic shortness of breath is unchanged.  She denies any fevers or chills.  Prediabetes:  She is compliant with a low sugar/carbohydrate diet.  She is exercising regularly - walks the dog.  Hypertension: She is taking her medication daily. She is compliant with a low sodium diet.  She denies chest pain, palpitations, edema, shortness of breath and regular headaches. She is exercising regularly.       CKD:  She is seeing nephrology.  She avoids nsaids and drinks a good amount of water.    Hyperlipidemia: She is taking her medication daily. She is compliant with a low fat/cholesterol diet. She is exercising. She denies myalgias.    GERD:  She is taking her medication daily as prescribed.  She denies any GERD symptoms and feels her GERD is well controlled.      Medications and allergies reviewed with patient and updated if appropriate.  Patient Active Problem List   Diagnosis Date Noted  . Community acquired pneumonia 02/21/2018  . Hypercalcemia 02/21/2018  . Neuralgia 01/16/2018  . Neck pain 01/16/2018  . Prediabetes 07/27/2017  . Cough 07/27/2017  . Kyphosis 05/04/2017  . Chronic shoulder bursitis, left 02/15/2017  . 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 Migraine headache 01/26/2017  . Back pain 01/26/2017  . Chronic kidney disease 01/26/2017  . Hypertensive retinopathy 11/29/2016  . Osteoarthritis of right knee 07/28/2016  . Basal cell carcinoma of skin 11/26/2015  . Abnormal CXR 01/27/2013  . Postmenopausal symptoms   . Obesity 12/25/2009  . Essential  hypertension, benign 12/24/2008  . ALLERGIC RHINITIS, CHRONIC 12/24/2008  . Hyperlipidemia 12/19/2007  . ANEMIA 12/19/2007  . GERD 12/19/2007  . Osteopenia 12/19/2007    Current Outpatient Medications on File Prior to Visit  Medication Sig Dispense Refill  . amLODipine (NORVASC) 5 MG tablet Take 1 tablet (5 mg total) by mouth daily. 90 tablet 3  . aspirin-acetaminophen-caffeine (EXCEDRIN MIGRAINE) 947-096-28 MG tablet Take by mouth every 6 (six) hours as needed for headache.    . cholecalciferol (VITAMIN D) 1000 units tablet Take 1,000 Units by mouth daily.    . ciclopirox (LOPROX) 0.77 % cream Apply 1 application topically 2 (two) times daily as needed. rash    . cycloSPORINE (RESTASIS) 0.05 % ophthalmic emulsion 1 drop 2 (two) times daily.      Eduard Roux (AIMOVIG) 70 MG/ML SOAJ Inject 70 mg into the skin every 30 (thirty) days. 3 pen 3  . esomeprazole (NEXIUM) 40 MG capsule Take 40 mg by mouth every other day.    . famotidine (PEPCID) 40 MG tablet Take 1 tablet (40 mg total) by mouth daily. 30 tablet 5  . Flaxseed, Linseed, (FLAXSEED OIL PO) Take 2 tablets by mouth daily.    . fluticasone (FLONASE) 50 MCG/ACT nasal spray PLACE 1 SPRAY INTO BOTH NOSTRILS DAILY. 16 g 1  . nadolol (CORGARD) 40 MG tablet TAKE 1 TABLET BY MOUTH TWICE A DAY 180 tablet 1  . rizatriptan (MAXALT-MLT) 10 MG disintegrating tablet Take 1 tablet (  10 mg total) by mouth as needed for migraine. May repeat in 2 hours if needed 9 tablet 11  . simvastatin (ZOCOR) 40 MG tablet Take 1 tablet (40 mg total) by mouth at bedtime. 90 tablet 3  . topiramate (TOPAMAX) 50 MG tablet Take 1 tablet (50 mg total) by mouth 2 (two) times daily. 180 tablet 4  . valsartan (DIOVAN) 320 MG tablet Take 1 tablet (320 mg total) by mouth daily. 30 tablet 5   No current facility-administered medications on file prior to visit.     Past Medical History:  Diagnosis Date  . ALLERGIC RHINITIS, CHRONIC   . ANEMIA   . Arthritis    "knees  mainly; thumbs"  . 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 Chronic sinusitis    follows with ENT for same  . CKD (chronic kidney disease) 2018  . COPD (chronic obstructive pulmonary disease) ([facility]) dx 2013   GOLD 1, follows with pulm for same  . Episodic tension type headache   . EXOGENOUS OBESITY   . GERD   . HYPERLIPIDEMIA   . Hypertension   . Migraines 07/10/2012   "over; last one was @ age 22"  . OSTEOPENIA   . Pneumonia 2018-2019   x 2    Past Surgical History:  Procedure Laterality Date  . APPENDECTOMY  07/10/2012  . BREAST CYST ASPIRATION  ?1980's   right  . EYE SURGERY     both eyes  . KNEE ARTHROSCOPY  1970's or 1980's   right; torn cartilage  . LAPAROSCOPIC APPENDECTOMY  07/10/2012   Procedure: APPENDECTOMY LAPAROSCOPIC;  Surgeon: Harl Bowie, MD;  Location: [facility];  Service: General;  Laterality: N/A;  . MOHS SURGERY  01/2016   nose  . MOLE REMOVAL  1957   "my back"  . SKIN CANCER EXCISION     "2 on my back; 2 on my face"  . TONSILLECTOMY AND ADENOIDECTOMY  1946  . [place] EXTRACTION  1959    Social History   Socioeconomic History  . Marital status: Single    Spouse name: Not on file  . Number of children: 0  . Years of education: 19  . Highest education level: Not on file  Occupational History    Comment: retired Pharmacist, hospital, 5th grade  Social Needs  . Financial resource strain: Not on file  . Food insecurity:    Worry: Not on file    Inability: Not on file  . Transportation needs:    Medical: Not on file    Non-medical: Not on file  Tobacco Use  . Smoking status: Former Smoker    Packs/day: 0.75    Years: 20.00    Pack years: 15.00    Types: Cigarettes    Last attempt to quit: 07/25/1982    Years since quitting: 35.8  . Smokeless tobacco: Never Used  . Tobacco comment: 07/10/2012 "stopped smoking cigarettes 1980's"  Substance and Sexual Activity  . Alcohol use: Yes    Comment:  07/10/2012 "glass of wine 1-2X/yr"  . Drug use: No  . Sexual activity: Never  Lifestyle  . Physical activity:    Days per week: Not on file    Minutes per session: Not on file  . Stress: Not on file  Relationships  . Social connections:    Talks on phone: Not on file    Gets together: Not on file    Attends religious service: Not  on file    Active member of club or organization: Not on file    Attends meetings of clubs or organizations: Not on file    Relationship status: Not on file  Other Topics Concern  . 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 Prostate cancer Father   . Cancer Father        prostate  . Cancer Mother        waldenstruns microgobular anemia  . Hyperparathyroidism Mother   . Alcohol abuse Other   . Heart disease Other   . Hypertension Other   . Diabetes Other   . Pancreatic cancer Other   . Hyperparathyroidism Sister   . Cancer Maternal Grandfather        pancreatic    Review of Systems  Constitutional: Positive for fatigue (mild - improving). Negative for appetite change, chills and fever.  Respiratory: Positive for cough (occ, dry primarily) and shortness of breath (chronic). Negative for wheezing.   Cardiovascular: Negative for chest pain, palpitations and leg swelling.  Neurological: Positive for dizziness (intermittent) and headaches (follows with neuro).       Objective:   Vitals:   05/22/18 1132  BP: 130/82  Pulse: 68  Resp: 16  Temp: 98.2 F (36.8 C)  SpO2: 96%   BP Readings from Last 3 Encounters:  05/22/18 130/82  04/18/18 113/72  04/06/18 (!) 172/80   Wt Readings from Last 3 Encounters:  05/22/18 146 lb (66.2 kg)  04/18/18 146 lb (66.2 kg)  04/06/18 147 lb (66.7 kg)   Body mass index is 27.59 kg/m.   Physical Exam    Constitutional: Appears well-developed and well-nourished. No distress.    HENT:  Head: Normocephalic and atraumatic.  Neck: Neck supple. No tracheal deviation present. No thyromegaly present.  No cervical lymphadenopathy Cardiovascular: Normal rate, regular rhythm and normal heart sounds.   No murmur heard. No carotid bruit .  No edema Pulmonary/Chest: Effort normal and breath sounds normal. No respiratory distress. No has no wheezes. No rales.  Skin: Skin is warm and dry. Not diaphoretic.  Psychiatric: Normal mood and affect. Behavior is normal.      Assessment & Plan:    See Problem List for Assessment and Plan of chronic medical problems.

## 2018-05-22 ENCOUNTER — Encounter: Payer: Self-pay | Admitting: Internal Medicine

## 2018-05-22 ENCOUNTER — Other Ambulatory Visit (INDEPENDENT_AMBULATORY_CARE_PROVIDER_SITE_OTHER): Payer: Medicare Other

## 2018-05-22 ENCOUNTER — Ambulatory Visit: Payer: Medicare Other | Admitting: Internal Medicine

## 2018-05-22 VITALS — BP 130/82 | HR 68 | Temp 98.2°F | Resp 16 | Wt 146.0 lb

## 2018-05-22 DIAGNOSIS — N189 Chronic kidney disease, unspecified: Secondary | ICD-10-CM | POA: Diagnosis not present

## 2018-05-22 DIAGNOSIS — K219 Gastro-esophageal reflux disease without esophagitis: Secondary | ICD-10-CM

## 2018-05-22 DIAGNOSIS — E7849 Other hyperlipidemia: Secondary | ICD-10-CM

## 2018-05-22 DIAGNOSIS — R7303 Prediabetes: Secondary | ICD-10-CM | POA: Diagnosis not present

## 2018-05-22 DIAGNOSIS — I10 Essential (primary) hypertension: Secondary | ICD-10-CM

## 2018-05-22 LAB — COMPREHENSIVE METABOLIC PANEL
ALBUMIN: 3.8 g/dL (ref 3.5–5.2)
ALT: 12 U/L (ref 0–35)
AST: 18 U/L (ref 0–37)
Alkaline Phosphatase: 90 U/L (ref 39–117)
BUN: 23 mg/dL (ref 6–23)
CALCIUM: 10.5 mg/dL (ref 8.4–10.5)
CO2: 24 meq/L (ref 19–32)
Chloride: 112 mEq/L (ref 96–112)
Creatinine, Ser: 1.28 mg/dL — ABNORMAL HIGH (ref 0.40–1.20)
GFR: 42.73 mL/min — ABNORMAL LOW (ref >60.00)
Glucose, Bld: 93 mg/dL (ref 70–99)
Potassium: 3.9 mEq/L (ref 3.5–5.1)
Sodium: 144 mEq/L (ref 135–145)
Total Bilirubin: 0.3 mg/dL (ref 0.2–1.2)
Total Protein: 7.1 g/dL (ref 6.0–8.3)

## 2018-05-22 LAB — CBC WITH DIFFERENTIAL/PLATELET
BASOS ABS: 0.1 10*3/uL (ref 0.0–0.1)
Basophils Relative: 1 % (ref 0.0–3.0)
EOS ABS: 0.4 10*3/uL (ref 0.0–0.7)
Eosinophils Relative: 5.2 % — ABNORMAL HIGH (ref 0.0–5.0)
HCT: 36.6 % (ref 36.0–46.0)
Hemoglobin: 12 g/dL (ref 12.0–15.0)
LYMPHS ABS: 1.2 10*3/uL (ref 0.7–4.0)
Lymphocytes Relative: 16.7 % (ref 12.0–46.0)
MCHC: 32.7 g/dL (ref 30.0–36.0)
MCV: 93.9 fl (ref 78.0–100.0)
Monocytes Absolute: 0.5 10*3/uL (ref 0.1–1.0)
Monocytes Relative: 7.8 % (ref 3.0–12.0)
NEUTROS ABS: 4.9 10*3/uL (ref 1.4–7.7)
NEUTROS PCT: 69.3 % (ref 43.0–77.0)
PLATELETS: 262 10*3/uL (ref 150.0–400.0)
RBC: 3.89 Mil/uL (ref 3.87–5.11)
RDW: 14.2 % (ref 11.5–15.5)
WBC: 7.1 10*3/uL (ref 4.0–10.5)

## 2018-05-22 LAB — LIPID PANEL
Cholesterol: 150 mg/dL (ref 0–200)
HDL: 68.5 mg/dL (ref >39.00)
LDL Cholesterol: 61 mg/dL (ref 0–99)
NONHDL: 81.73
TRIGLYCERIDES: 103 mg/dL (ref 0.0–149.0)
Total CHOL/HDL Ratio: 2
VLDL: 20.6 mg/dL (ref 0.0–40.0)

## 2018-05-22 LAB — HEMOGLOBIN A1C: Hgb A1c MFr Bld: 6.1 % (ref 4.6–6.5)

## 2018-05-22 NOTE — Assessment & Plan Note (Signed)
Following with nephrology Drinking plenty water Avoid NSAIDs Check CMP, CBC

## 2018-05-22 NOTE — Assessment & Plan Note (Signed)
Check lipid panel  Continue daily statin Regular exercise and healthy diet encouraged  

## 2018-05-22 NOTE — Assessment & Plan Note (Signed)
Check a1c Low sugar / carb diet Stressed regular exercise   

## 2018-05-22 NOTE — Patient Instructions (Addendum)
  Test(s) ordered today. Your results will be released to [facility] (or called to you) after review, usually within 72hours after test completion. If any changes need to be made, you will be notified at that same time.  Medications reviewed and updated.  No changes recommended at this time.    Please followup in 6 months, sooner if needed

## 2018-05-22 NOTE — Assessment & Plan Note (Signed)
GERD controlled Continue daily medication  

## 2018-05-22 NOTE — Assessment & Plan Note (Signed)
BP well controlled Current regimen effective and well tolerated Continue current medications at current doses cmp  

## 2018-05-25 ENCOUNTER — Encounter: Payer: Self-pay | Admitting: Internal Medicine

## 2018-05-31 LAB — HM DEXA SCAN

## 2018-06-02 ENCOUNTER — Encounter: Payer: Self-pay | Admitting: Internal Medicine

## 2018-06-04 ENCOUNTER — Encounter: Payer: Self-pay | Admitting: Internal Medicine

## 2018-06-06 ENCOUNTER — Telehealth: Payer: Self-pay

## 2018-06-06 NOTE — Telephone Encounter (Signed)
Insurance will be veriifed for prolia, I will call patient back to discuss summary of benefits

## 2018-06-06 NOTE — Telephone Encounter (Signed)
-----   Message from [name], MD sent at 06/06/2018  7:30 AM EDT ----- Has severe osteopenia with high frax.  No fx or previous treatment.  Has dec GFR and is a good candidate for prolia - can you please look into cost.  Thanks.

## 2018-07-04 ENCOUNTER — Telehealth: Payer: Self-pay | Admitting: Internal Medicine

## 2018-07-04 NOTE — Telephone Encounter (Signed)
Insurance has been submitted and verified for Prolia. Patient is responsible for a $50 copay. Due anytime. Appointment scheduled 07/12/2018.

## 2018-07-07 ENCOUNTER — Telehealth: Payer: Self-pay | Admitting: Internal Medicine

## 2018-07-07 NOTE — Telephone Encounter (Signed)
Patient has cancelled appt for prolia.  Patient would like a call back with more information on Prolia and what the side effects to starting this medication are before she reschedules.

## 2018-07-07 NOTE — Telephone Encounter (Signed)
I called pt- she thinks she read something about using Prolia and having certain dental procedures increases risks for developing gangrene. She states she asked her dentist about this and he confirmed something similar. Please advise.

## 2018-07-09 NOTE — Telephone Encounter (Signed)
The risk of this is very low.   If a big dental procedure is going to be done such as implants we will typically hold the prolia and coordinate with her dentist.  For routine dental work there is no concern.

## 2018-07-11 NOTE — Telephone Encounter (Signed)
Pt aware of response below. She wanted more information about prolia and side effects. I advised that the person who handles the prolia is out of the office but she will be back next week and I will have her call her to give more information. Pt understood.

## 2018-07-12 ENCOUNTER — Ambulatory Visit: Payer: Medicare Other

## 2018-07-13 ENCOUNTER — Telehealth: Payer: Self-pay

## 2018-07-13 ENCOUNTER — Other Ambulatory Visit: Payer: Self-pay | Admitting: Internal Medicine

## 2018-07-13 NOTE — Telephone Encounter (Signed)
Rn receive fax on [facility] from [place]. Medication approve from 07/13/2018 to 11/07/2018. PA reference [id number].Contact number is  [phone].

## 2018-07-13 NOTE — Telephone Encounter (Signed)
PA done on Aimovig cover my meds. [facility] pending results.

## 2018-07-18 ENCOUNTER — Other Ambulatory Visit: Payer: Self-pay | Admitting: Internal Medicine

## 2018-07-20 ENCOUNTER — Other Ambulatory Visit: Payer: Self-pay | Admitting: Internal Medicine

## 2018-07-21 NOTE — Telephone Encounter (Signed)
She actually has higher levels of calcium and mildly elevated PTH, so ok not to take calcium and still get prolia.  She should continue the vitamin d.     Her calcium levels are monitored by Dr Loanne Drilling so it will be monitored while on prolia

## 2018-07-21 NOTE — Telephone Encounter (Signed)
Routing to dr burns, after talking with patient today, she is stating that her kidney specialist has advised that she not take calcium supplements due to kidney function--and with starting prolia, would this create calcium deficiency or do you think patient is still ok with taking prolia?  Would Vitamin D supplement be enough for prolia to work correctly?  Please advise, I will call patient back, thanks

## 2018-07-24 NOTE — Telephone Encounter (Signed)
Left message advising patientf of dr burns note/instruction--call back if any questions

## 2018-07-30 ENCOUNTER — Other Ambulatory Visit: Payer: Self-pay | Admitting: Internal Medicine

## 2018-07-31 ENCOUNTER — Other Ambulatory Visit: Payer: Self-pay

## 2018-07-31 MED ORDER — FLUTICASONE PROPIONATE 50 MCG/ACT NA SUSP: 1.0000 | Freq: Every day | NASAL | 1 refills | Status: DC

## 2018-08-14 ENCOUNTER — Other Ambulatory Visit: Payer: Self-pay | Admitting: Internal Medicine

## 2018-09-05 ENCOUNTER — Encounter: Payer: Self-pay | Admitting: Internal Medicine

## 2018-09-05 ENCOUNTER — Ambulatory Visit: Payer: Medicare Other | Admitting: Internal Medicine

## 2018-09-05 ENCOUNTER — Other Ambulatory Visit: Payer: Medicare Other

## 2018-09-05 VITALS — BP 162/94 | HR 66 | Temp 98.4°F | Resp 16 | Ht 61.0 in | Wt 141.0 lb

## 2018-09-05 DIAGNOSIS — N3000 Acute cystitis without hematuria: Secondary | ICD-10-CM | POA: Insufficient documentation

## 2018-09-05 DIAGNOSIS — R300 Dysuria: Secondary | ICD-10-CM

## 2018-09-05 LAB — POCT URINALYSIS DIPSTICK
Bilirubin, UA: NEGATIVE
GLUCOSE UA: NEGATIVE
KETONES UA: NEGATIVE
Nitrite, UA: NEGATIVE
Protein, UA: POSITIVE — AB
Spec Grav, UA: >=1.03 — AB (ref 1.010–1.025)
Urobilinogen, UA: NEGATIVE E.U./dL — AB
pH, UA: 6 (ref 5.0–8.0)

## 2018-09-05 MED ORDER — CEPHALEXIN 500 MG PO CAPS: 500.0000 mg | ORAL_CAPSULE | Freq: Two times a day (BID) | ORAL | 0 refills | Status: DC

## 2018-09-05 NOTE — Patient Instructions (Addendum)
Take the antibiotic as prescribed.  Take tylenol if needed.     Increase your water intake.   Call if no improvement     Urinary Tract Infection, Adult A urinary tract infection (UTI) is an infection of any part of the urinary tract, which includes the kidneys, ureters, bladder, and urethra. These organs make, store, and get rid of urine in the body. UTI can be a bladder infection (cystitis) or kidney infection (pyelonephritis). What are the causes? This infection may be caused by fungi, viruses, or bacteria. Bacteria are the most common cause of UTIs. This condition can also be caused by repeated incomplete emptying of the bladder during urination. What increases the risk? This condition is more likely to develop if:  You ignore your need to urinate or hold urine for long periods of time.  You do not empty your bladder completely during urination.  You wipe back to front after urinating or having a bowel movement, if you are female.  You are uncircumcised, if you are female.  You are constipated.  You have a urinary catheter that stays in place (indwelling).  You have a weak defense (immune) system.  You have a medical condition that affects your bowels, kidneys, or bladder.  You have diabetes.  You take antibiotic medicines frequently or for long periods of time, and the antibiotics no longer work well against certain types of infections (antibiotic resistance).  You take medicines that irritate your urinary tract.  You are exposed to chemicals that irritate your urinary tract.  You are female.  What are the signs or symptoms? Symptoms of this condition include:  Fever.  Frequent urination or passing small amounts of urine frequently.  Needing to urinate urgently.  Pain or burning with urination.  Urine that smells bad or unusual.  Cloudy urine.  Pain in the lower abdomen or back.  Trouble urinating.  Blood in the urine.  Vomiting or being less hungry than  normal.  Diarrhea or abdominal pain.  Vaginal discharge, if you are female.  How is this diagnosed? This condition is diagnosed with a medical history and physical exam. You will also need to provide a urine sample to test your urine. Other tests may be done, including:  Blood tests.  Sexually transmitted disease (STD) testing.  If you have had more than one UTI, a cystoscopy or imaging studies may be done to determine the cause of the infections. How is this treated? Treatment for this condition often includes a combination of two or more of the following:  Antibiotic medicine.  Other medicines to treat less common causes of UTI.  Over-the-counter medicines to treat pain.  Drinking enough water to stay hydrated.  Follow these instructions at home:  Take over-the-counter and prescription medicines only as told by your health care provider.  If you were prescribed an antibiotic, take it as told by your health care provider. Do not stop taking the antibiotic even if you start to feel better.  Avoid alcohol, caffeine, tea, and carbonated beverages. They can irritate your bladder.  Drink enough fluid to keep your urine clear or pale yellow.  Keep all follow-up visits as told by your health care provider. This is important.  Make sure to: ? Empty your bladder often and completely. Do not hold urine for long periods of time. ? Empty your bladder before and after sex. ? Wipe from front to back after a bowel movement if you are female. Use each tissue one time when you   wipe. Contact a health care provider if:  You have back pain.  You have a fever.  You feel nauseous or vomit.  Your symptoms do not get better after 3 days.  Your symptoms go away and then return. Get help right away if:  You have severe back pain or lower abdominal pain.  You are vomiting and cannot keep down any medicines or water. This information is not intended to replace advice given to you by  your health care provider. Make sure you discuss any questions you have with your health care provider. Document Released: 08/04/2005 Document Revised: 04/07/2016 Document Reviewed: 09/15/2015 Elsevier Interactive Patient Education  2018 Elsevier Inc.   

## 2018-09-05 NOTE — Assessment & Plan Note (Signed)
Urine dip consistent with UTI Will send urine for culture Take the antibiotic as prescribed.   Take tylenol if needed.   Increase your water intake.  Call if no improvement   

## 2018-09-05 NOTE — Progress Notes (Signed)
Subjective:    Patient ID: Joy Martin, female    DOB: 1941/09/10, 77 y.o.   MRN: [id number]  HPI The patient is here for an acute visit.   ? UTI:  Her symptoms started about 3 days ago.  She states frequent urination, pressure with urination, dysuria, a couple of episodes of lower abdominal pressure/discomfort and possible cloudy urine.    She denies blood in the urine.  She had a low grade fever last night.  She denies nausea, chills, back pain.    Medications and allergies reviewed with patient and updated if appropriate.  Patient Active Problem List   Diagnosis Date Noted  . Community acquired pneumonia 02/21/2018  . Hypercalcemia 02/21/2018  . Neuralgia 01/16/2018  . Neck pain 01/16/2018  . Prediabetes 07/27/2017  . Cough 07/27/2017  . Kyphosis 05/04/2017  . Chronic shoulder bursitis, left 02/15/2017  . 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 Migraine headache 01/26/2017  . Back pain 01/26/2017  . Chronic kidney disease 01/26/2017  . Hypertensive retinopathy 11/29/2016  . Osteoarthritis of right knee 07/28/2016  . Basal cell carcinoma of skin 11/26/2015  . Abnormal CXR 01/27/2013  . Obesity 12/25/2009  . Essential hypertension, benign 12/24/2008  . ALLERGIC RHINITIS, CHRONIC 12/24/2008  . Hyperlipidemia 12/19/2007  . ANEMIA 12/19/2007  . GERD 12/19/2007  . Osteopenia 12/19/2007    Current Outpatient Medications on File Prior to Visit  Medication Sig Dispense Refill  . amLODipine (NORVASC) 5 MG tablet Take 1 tablet (5 mg total) by mouth daily. 90 tablet 3  . aspirin-acetaminophen-caffeine (EXCEDRIN MIGRAINE) 175-102-58 MG tablet Take by mouth every 6 (six) hours as needed for headache.    . cholecalciferol (VITAMIN D) 1000 units tablet Take 1,000 Units by mouth daily.    . ciclopirox (LOPROX) 0.77 % cream Apply 1 application topically 2 (two) times daily as needed. rash    . cycloSPORINE (RESTASIS) 0.05 % ophthalmic emulsion 1 drop 2 (two) times  daily.      Eduard Roux (AIMOVIG) 70 MG/ML SOAJ Inject 70 mg into the skin every 30 (thirty) days. 3 pen 3  . esomeprazole (NEXIUM) 40 MG capsule Take 40 mg by mouth every other day.    . famotidine (PEPCID) 40 MG tablet TAKE 1 TABLET BY MOUTH EVERY DAY 30 tablet 5  . Flaxseed, Linseed, (FLAXSEED OIL PO) Take 2 tablets by mouth daily.    . fluticasone (FLONASE) 50 MCG/ACT nasal spray PLACE 1 SPRAY INTO BOTH NOSTRILS DAILY. 16 g 1  . fluticasone (FLONASE) 50 MCG/ACT nasal spray Place 1 spray into both nostrils daily. 48 g 1  . nadolol (CORGARD) 40 MG tablet TAKE 1 TABLET BY MOUTH TWICE A DAY 180 tablet 1  . rizatriptan (MAXALT-MLT) 10 MG disintegrating tablet Take 1 tablet (10 mg total) by mouth as needed for migraine. May repeat in 2 hours if needed 9 tablet 11  . simvastatin (ZOCOR) 40 MG tablet TAKE 1 TABLET BY MOUTH EVERYDAY AT BEDTIME 90 tablet 1  . topiramate (TOPAMAX) 50 MG tablet Take 1 tablet (50 mg total) by mouth 2 (two) times daily. 180 tablet 4  . valsartan (DIOVAN) 320 MG tablet TAKE 1 TABLET BY MOUTH EVERY DAY 30 tablet 5   No current facility-administered medications on file prior to visit.     Past Medical History:  Diagnosis Date  . ALLERGIC RHINITIS, CHRONIC   . ANEMIA   . Arthritis    "knees mainly; thumbs"  . BCC (basal cell carcinoma), lip 04/2015  removed right upper lip/perinostril [name] ([place])  . Chronic sinusitis    follows with ENT for same  . CKD (chronic kidney disease) 2018  . COPD (chronic obstructive pulmonary disease) ([place]) dx 2013   GOLD 1, follows with pulm for same  . Episodic tension type headache   . EXOGENOUS OBESITY   . GERD   . HYPERLIPIDEMIA   . Hypertension   . Migraines 07/10/2012   "over; last one was @ age 23"  . OSTEOPENIA   . Pneumonia 2018-2019   x 2    Past Surgical History:  Procedure Laterality Date  . APPENDECTOMY  07/10/2012  . BREAST CYST ASPIRATION  ?1980's   right  . EYE SURGERY     both eyes  . KNEE ARTHROSCOPY   1970's or 1980's   right; torn cartilage  . LAPAROSCOPIC APPENDECTOMY  07/10/2012   Procedure: APPENDECTOMY LAPAROSCOPIC;  Surgeon: Harl Bowie, MD;  Location: [place];  Service: General;  Laterality: N/A;  . MOHS SURGERY  01/2016   nose  . MOLE REMOVAL  1957   "my back"  . SKIN CANCER EXCISION     "2 on my back; 2 on my face"  . TONSILLECTOMY AND ADENOIDECTOMY  1946  . [place] EXTRACTION  1959    Social History   Socioeconomic History  . Marital status: Single    Spouse name: Not on file  . Number of children: 0  . Years of education: 108  . Highest education level: Not on file  Occupational History    Comment: retired Pharmacist, hospital, 5th grade  Social Needs  . Financial resource strain: Not on file  . Food insecurity:    Worry: Not on file    Inability: Not on file  . Transportation needs:    Medical: Not on file    Non-medical: Not on file  Tobacco Use  . Smoking status: Former Smoker    Packs/day: 0.75    Years: 20.00    Pack years: 15.00    Types: Cigarettes    Last attempt to quit: 07/25/1982    Years since quitting: 36.1  . Smokeless tobacco: Never Used  . Tobacco comment: 07/10/2012 "stopped smoking cigarettes 1980's"  Substance and Sexual Activity  . Alcohol use: Yes    Comment: 07/10/2012 "glass of wine 1-2X/yr"  . Drug use: No  . Sexual activity: Never  Lifestyle  . Physical activity:    Days per week: Not on file    Minutes per session: Not on file  . Stress: Not on file  Relationships  . Social connections:    Talks on phone: Not on file    Gets together: Not on file    Attends religious service: Not on file    Active member of club or organization: Not on file    Attends meetings of clubs or organizations: Not on file    Relationship status: Not on file  Other Topics Concern  . 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  Prostate cancer Father   . Cancer Father        prostate  . Cancer Mother        waldenstruns microgobular anemia  . Hyperparathyroidism Mother   . Alcohol abuse Other   . Heart disease Other   . Hypertension Other   .  Diabetes Other   . Pancreatic cancer Other   . Hyperparathyroidism Sister   . Cancer Maternal Grandfather        pancreatic    Review of Systems  Constitutional: Positive for fatigue (mild) and fever (equivalent of low grade last night). Negative for chills.  Gastrointestinal: Positive for abdominal pain (mild). Negative for nausea.  Genitourinary: Positive for dysuria and frequency. Negative for hematuria.  Musculoskeletal: Negative for back pain (nothing new).       Objective:   Vitals:   09/05/18 0843  BP: (!) 162/94  Pulse: 66  Resp: 16  Temp: 98.4 F (36.9 C)  SpO2: 97%   BP Readings from Last 3 Encounters:  09/05/18 (!) 162/94  05/22/18 130/82  04/18/18 113/72   Wt Readings from Last 3 Encounters:  09/05/18 141 lb (64 kg)  05/22/18 146 lb (66.2 kg)  04/18/18 146 lb (66.2 kg)   Body mass index is 26.64 kg/m.   Physical Exam  Constitutional: She appears well-developed and well-nourished. No distress.  HENT:  Head: Normocephalic and atraumatic.  Abdominal: Soft. She exhibits no distension. There is no tenderness.  Genitourinary:  Genitourinary Comments: No cva tenderness  Skin: She is not diaphoretic.         Assessment & Plan:    See Problem List for Assessment and Plan of chronic medical problems.

## 2018-09-07 LAB — URINE CULTURE
MICRO NUMBER:: 91299914
SPECIMEN QUALITY: ADEQUATE

## 2018-09-27 ENCOUNTER — Ambulatory Visit (INDEPENDENT_AMBULATORY_CARE_PROVIDER_SITE_OTHER): Payer: Medicare Other

## 2018-09-27 DIAGNOSIS — M810 Age-related osteoporosis without current pathological fracture: Secondary | ICD-10-CM

## 2018-09-27 MED ORDER — DENOSUMAB 60 MG/ML ~~LOC~~ SOSY
60.0000 mg | PREFILLED_SYRINGE | Freq: Once | SUBCUTANEOUS | Status: AC
Start: 2018-09-27 — End: 2018-09-27
  Administered 2018-09-27: 60 mg via SUBCUTANEOUS

## 2018-09-27 NOTE — Progress Notes (Signed)
prolia Injection given.   Jahvon Gosline J Conrado Nance, MD  

## 2018-10-23 ENCOUNTER — Telehealth: Payer: Self-pay | Admitting: *Deleted

## 2018-10-23 NOTE — Telephone Encounter (Signed)
Received approval: Request Reference Number: [id number]. AIMOVIG INJ 70MG /ML is approved through 11/08/2019.

## 2018-10-23 NOTE — Telephone Encounter (Addendum)
Aimovig PA started on CMM, key [id number]. Patient cannot take propanolol because she is taking nadolol, cannot take amitriptyline due to advanced age. [facility] is reviewing your PA request. Typically an electronic response will be received within 72 hours.

## 2018-10-25 ENCOUNTER — Ambulatory Visit: Payer: Medicare Other | Admitting: Endocrinology

## 2018-10-27 ENCOUNTER — Encounter: Payer: Self-pay | Admitting: Endocrinology

## 2018-10-27 ENCOUNTER — Ambulatory Visit: Payer: Medicare Other | Admitting: Endocrinology

## 2018-10-27 DIAGNOSIS — E210 Primary hyperparathyroidism: Secondary | ICD-10-CM

## 2018-10-27 NOTE — Progress Notes (Signed)
Subjective:    Patient ID: Joy Martin, female    DOB: 20-Sep-1941, 77 y.o.   MRN: [id number]  HPI Pt returns for f/u of primary hyperparathyroidism (dx'ed 2012 (it was normal in 2011); she has never had urolithiasis or bony fracture; she took fosamax 2012-2017.  She stopped vit-D supplement in early 2019;  HCTZ was stopped in early 2019).  She takes 1000 units per day.  pt states she feels well in general, except for fatigue.  She takes vit-D, 1000 units/day.  She takes Prolia for osteoporosis.  Pt says she recently had labs at [facility].  Past Medical History:  Diagnosis Date  . ALLERGIC RHINITIS, CHRONIC   . ANEMIA   . Arthritis    "knees mainly; thumbs"  . 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 Chronic sinusitis    follows with ENT for same  . CKD (chronic kidney disease) 2018  . COPD (chronic obstructive pulmonary disease) ([place]) dx 2013   GOLD 1, follows with pulm for same  . Episodic tension type headache   . EXOGENOUS OBESITY   . GERD   . HYPERLIPIDEMIA   . Hypertension   . Migraines 07/10/2012   "over; last one was @ age 21"  . OSTEOPENIA   . Pneumonia 2018-2019   x 2    Past Surgical History:  Procedure Laterality Date  . APPENDECTOMY  07/10/2012  . BREAST CYST ASPIRATION  ?1980's   right  . EYE SURGERY     both eyes  . KNEE ARTHROSCOPY  1970's or 1980's   right; torn cartilage  . LAPAROSCOPIC APPENDECTOMY  07/10/2012   Procedure: APPENDECTOMY LAPAROSCOPIC;  Surgeon: Harl Bowie, MD;  Location: [place];  Service: General;  Laterality: N/A;  . MOHS SURGERY  01/2016   nose  . MOLE REMOVAL  1957   "my back"  . SKIN CANCER EXCISION     "2 on my back; 2 on my face"  . TONSILLECTOMY AND ADENOIDECTOMY  1946  . [place] EXTRACTION  1959    Social History   Socioeconomic History  . Marital status: Single    Spouse name: Not on file  . Number of children: 0  . Years of education: 61  .  Highest education level: Not on file  Occupational History    Comment: retired Pharmacist, hospital, 5th grade  Social Needs  . Financial resource strain: Not on file  . Food insecurity:    Worry: Not on file    Inability: Not on file  . Transportation needs:    Medical: Not on file    Non-medical: Not on file  Tobacco Use  . Smoking status: Former Smoker    Packs/day: 0.75    Years: 20.00    Pack years: 15.00    Types: Cigarettes    Last attempt to quit: 07/25/1982    Years since quitting: 36.2  . Smokeless tobacco: Never Used  . Tobacco comment: 07/10/2012 "stopped smoking cigarettes 1980's"  Substance and Sexual Activity  . Alcohol use: Yes    Comment: 07/10/2012 "glass of wine 1-2X/yr"  . Drug use: No  . Sexual activity: Never  Lifestyle  . Physical activity:    Days per week: Not on file    Minutes per session: Not on file  . Stress: Not on file  Relationships  . Social connections:    Talks on phone: Not on file    Gets together:  Not on file    Attends religious service: Not on file    Active member of club or organization: Not on file    Attends meetings of clubs or organizations: Not on file    Relationship status: Not on file  . Intimate partner violence:    Fear of current or ex partner: Not on file    Emotionally abused: Not on file    Physically abused: Not on file    Forced sexual activity: Not on file  Other Topics Concern  . 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 amLODipine (NORVASC) 5 MG tablet Take 1 tablet (5 mg total) by mouth daily. 90 tablet 3  . aspirin-acetaminophen-caffeine (EXCEDRIN MIGRAINE) 382-505-39 MG tablet Take by mouth every 6 (six) hours as needed for headache.    . cholecalciferol (VITAMIN D) 1000 units tablet Take 1,000 Units by mouth daily.    . ciclopirox (LOPROX) 0.77 % cream  Apply 1 application topically 2 (two) times daily as needed. rash    . cycloSPORINE (RESTASIS) 0.05 % ophthalmic emulsion 1 drop 2 (two) times daily.      Eduard Roux (AIMOVIG) 70 MG/ML SOAJ Inject 70 mg into the skin every 30 (thirty) days. 3 pen 3  . esomeprazole (NEXIUM) 40 MG capsule Take 40 mg by mouth every other day.    . famotidine (PEPCID) 40 MG tablet TAKE 1 TABLET BY MOUTH EVERY DAY 30 tablet 5  . Flaxseed, Linseed, (FLAXSEED OIL PO) Take 2 tablets by mouth daily.    . fluticasone (FLONASE) 50 MCG/ACT nasal spray Place 1 spray into both nostrils daily. 48 g 1  . nadolol (CORGARD) 40 MG tablet TAKE 1 TABLET BY MOUTH TWICE A DAY 180 tablet 1  . rizatriptan (MAXALT-MLT) 10 MG disintegrating tablet Take 1 tablet (10 mg total) by mouth as needed for migraine. May repeat in 2 hours if needed 9 tablet 11  . simvastatin (ZOCOR) 40 MG tablet TAKE 1 TABLET BY MOUTH EVERYDAY AT BEDTIME 90 tablet 1  . topiramate (TOPAMAX) 50 MG tablet Take 1 tablet (50 mg total) by mouth 2 (two) times daily. 180 tablet 4  . valsartan (DIOVAN) 320 MG tablet TAKE 1 TABLET BY MOUTH EVERY DAY 30 tablet 5   No current facility-administered medications on file prior to visit.     Allergies  Allergen Reactions  . Allegra [Fexofenadine Hcl] Other (See Comments)    Headache, "took the pill; I got flu symptoms"  . Prilosec [Omeprazole]     Fecal incontince  . Ranitidine     Fecal incontince    Family History  Problem Relation Age of Onset  . Prostate cancer Father   . Cancer Father        prostate  . Cancer Mother        waldenstruns microgobular anemia  . Hyperparathyroidism Mother   . Alcohol abuse Other   . Heart disease Other   . Hypertension Other   . Diabetes Other   . Pancreatic cancer Other   . Hyperparathyroidism Sister   . Cancer Maternal Grandfather        pancreatic    BP 136/82 (BP Location: Left Arm, Patient Position: Sitting, Cuff Size: Normal)   Pulse 69   Temp 98.4 F (36.9 C)  (Oral)   Ht  5\' 1"  (1.549 m)   Wt 140 lb 9.6 oz (63.8 kg)   BMI 26.57 kg/m   Review of Systems Denies falls.      Objective:   Physical Exam VITAL SIGNS:  See vs page GENERAL: no distress GAIT: normal and steady.        Assessment & Plan:  Primary hyperparathyroidism: we'll request cc of labs. Osteoporosis: I agree with rx plan  Patient Instructions  Please sign a release of information from the kidney doctor.   I agree with Dr Quay Burow giving you the Prolia.   Please come back for a follow-up appointment in 6 months.

## 2018-10-27 NOTE — Patient Instructions (Addendum)
Please sign a release of information from the kidney doctor.   I agree with Dr Quay Burow giving you the Prolia.   Please come back for a follow-up appointment in 6 months.

## 2018-10-29 DIAGNOSIS — E210 Primary hyperparathyroidism: Secondary | ICD-10-CM | POA: Insufficient documentation

## 2018-11-11 ENCOUNTER — Other Ambulatory Visit: Payer: Self-pay | Admitting: Internal Medicine

## 2018-11-15 ENCOUNTER — Encounter: Payer: Self-pay | Admitting: Adult Health

## 2018-11-15 ENCOUNTER — Ambulatory Visit: Payer: Medicare Other | Admitting: Adult Health

## 2018-11-15 VITALS — BP 140/77 | HR 61 | Ht 61.0 in | Wt 139.8 lb

## 2018-11-15 DIAGNOSIS — R51 Headache: Secondary | ICD-10-CM

## 2018-11-15 DIAGNOSIS — R519 Headache, unspecified: Secondary | ICD-10-CM

## 2018-11-15 DIAGNOSIS — G43019 Migraine without aura, intractable, without status migrainosus: Secondary | ICD-10-CM

## 2018-11-15 DIAGNOSIS — G4440 Drug-induced headache, not elsewhere classified, not intractable: Secondary | ICD-10-CM

## 2018-11-15 DIAGNOSIS — T3995XA Adverse effect of unspecified nonopioid analgesic, antipyretic and antirheumatic, initial encounter: Secondary | ICD-10-CM

## 2018-11-15 NOTE — Patient Instructions (Addendum)
Your Plan:  Continue Aimovig- consider switching to Ajovy Limit Excedrin migraine to 5-10 tabs a month Continue Topamax Stop Maxalt Referral to Sleep If your symptoms worsen or you develop new symptoms please let us know.   Thank you for coming to see [place] at [facility]. I hope we have been able to provide you high quality care today.  You may receive a patient satisfaction survey over the next few weeks. We would appreciate your feedback and comments so that we may continue to improve ourselves and the health of our patients.

## 2018-11-15 NOTE — Progress Notes (Signed)
PATIENT: Joy Martin DOB: Aug 05, 1941  REASON FOR VISIT: follow up HISTORY FROM: patient  HISTORY OF PRESENT ILLNESS: Today 11/15/18:  [name] is a 78 year old female with a history of headaches.  She returns today for follow-up.  She is been taking AImovig last 5 months.  She does not notice any change in her headaches.  She continues to have daily headaches.  She states that she typically wakes up with a headache and then the headache will progress to either a true migraine whereas on the right side of the head.  She typically will take Excedrin Migraine.  She reports that Maxalt has given her no benefit.  She states sometimes her headaches will progress to neck pain and then into a migraine.  She continues to take Excedrin Migraine daily.  She states that she has tried to limit her dose but that is the only thing that helps with her headaches.  She does state that she tends to fall asleep during the day.  She does not plan to take naps but does fall asleep if she sits down.  She does not have anyone that lives with her.  She is unsure if she snores.  She does state that she has to get up multiple times at night for urinary issues.  She returns today for evaluation.  HISTORY UPDATE (04/18/18, VRP): Since last visit, doing about the same. Tolerating meds. Still with daily headaches (right parietal soreness and burned sensation, sometimes pounding) and daily Excedrin usage. Left shoulder pain improving. No alleviating or aggravating factors.   UPDATE 04/12/17: Since last visit, HA are improving. On TPX 50mg  twice a day. Continues on excedrin 2-4 tabs per day. Now with more left shoulder pain, radiating to neck and head. Seeing sports medicine clinic and trying home exercises.  UPDATE 12/07/16: Since last visit visit, continues with daily headaches. Has slightly reduced excedrin to 2-4 tabs per day. Tolerating TPX 25mg  daily.   PRIOR HPI (08/27/16): 78 year old right-handed female here  for evaluation of headaches. Around age 80 years old patient had onset of right-sided headaches with right eye pain, throbbing, nausea, proceeded by visual disturbance and aura. Patient was diagnosed with migraine headaches. During factors include menstrual cycle and red wine. She averaged 2-3 headaches per month each lasting 1-2 days. Patient was evaluated by the headache clinic in the past and tried nadolol, Imitrex, over-the-counter medications. By age 59 years old patient was going through menopause and her migraine headaches subsided. Over many years patient has also had lower grade dull sore and intermittent stabbing headaches sometimes on the right side, sometimes on the top of her head. No other associated factors. These been going on for at least 10-20 years. Patient has gradually built up to using 2-6 tablets of Excedrin over-the-counter on a daily basis. This is been going on for many many years. Patient has tried weaning off in the past but this caused rebound headaches. Patient also was taking Fioricet 20 tablets per month for many years. Patient now establish with a new PCP who is reviewing medication list and advising patient to reduce medication overusage which may be triggering analgesic overuse headache and rebound headache. Patient referred to me for further evaluation consideration of prophylactic headache therapy.   REVIEW OF SYSTEMS: Out of a complete 14 system review of symptoms, the patient complains only of the following symptoms, and all other reviewed systems are negative.  Activity change, fatigue, eye itching, cough, shortness of breath, ringing in ears,  runny nose, frequent waking, daytime sleepiness, joint pain, back pain, neck pain, itching, headache, weakness  ALLERGIES: Allergies  Allergen Reactions  . Allegra [Fexofenadine Hcl] Other (See Comments)    Headache, "took the pill; I got flu symptoms"  . Prilosec [Omeprazole]     Fecal incontince  . Ranitidine     Fecal  incontince    HOME MEDICATIONS: Outpatient Medications Prior to Visit  Medication Sig Dispense Refill  . amLODipine (NORVASC) 5 MG tablet Take 1 tablet (5 mg total) by mouth daily. 90 tablet 3  . aspirin-acetaminophen-caffeine (EXCEDRIN MIGRAINE) 767-209-47 MG tablet Take by mouth every 6 (six) hours as needed for headache.    . cholecalciferol (VITAMIN D) 1000 units tablet Take 1,000 Units by mouth daily.    . ciclopirox (LOPROX) 0.77 % cream Apply 1 application topically 2 (two) times daily as needed. rash    . cycloSPORINE (RESTASIS) 0.05 % ophthalmic emulsion 1 drop 2 (two) times daily.      Marland Kitchen denosumab (PROLIA) 60 MG/ML SOSY injection Inject 60 mg into the skin every 6 (six) months.    Eduard Roux (AIMOVIG) 70 MG/ML SOAJ Inject 70 mg into the skin every 30 (thirty) days. 3 pen 3  . esomeprazole (NEXIUM) 40 MG capsule Take 40 mg by mouth every other day.    . famotidine (PEPCID) 40 MG tablet TAKE 1 TABLET BY MOUTH EVERY DAY 30 tablet 5  . Flaxseed, Linseed, (FLAXSEED OIL PO) Take 2 tablets by mouth daily.    . fluticasone (FLONASE) 50 MCG/ACT nasal spray Place 1 spray into both nostrils daily. 48 g 1  . nadolol (CORGARD) 40 MG tablet TAKE 1 TABLET BY MOUTH TWICE A DAY 180 tablet 1  . rizatriptan (MAXALT-MLT) 10 MG disintegrating tablet Take 1 tablet (10 mg total) by mouth as needed for migraine. May repeat in 2 hours if needed 9 tablet 11  . simvastatin (ZOCOR) 40 MG tablet TAKE 1 TABLET BY MOUTH EVERYDAY AT BEDTIME 90 tablet 1  . topiramate (TOPAMAX) 50 MG tablet Take 1 tablet (50 mg total) by mouth 2 (two) times daily. 180 tablet 4  . valsartan (DIOVAN) 320 MG tablet TAKE 1 TABLET BY MOUTH EVERY DAY 90 tablet 0   No facility-administered medications prior to visit.     PAST MEDICAL HISTORY: Past Medical History:  Diagnosis Date  . ALLERGIC RHINITIS, CHRONIC   . ANEMIA   . Arthritis    "knees mainly; thumbs"  . 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 Chronic sinusitis    follows with ENT for same  . CKD (chronic kidney disease) 2018  . COPD (chronic obstructive pulmonary disease) ([place]) dx 2013   GOLD 1, follows with pulm for same  . Episodic tension type headache   . EXOGENOUS OBESITY   . GERD   . HYPERLIPIDEMIA   . Hypertension   . Migraines 07/10/2012   "over; last one was @ age 41"  . OSTEOPENIA   . Pneumonia 2018-2019   x 2    PAST SURGICAL HISTORY: Past Surgical History:  Procedure Laterality Date  . APPENDECTOMY  07/10/2012  . BREAST CYST ASPIRATION  ?1980's   right  . EYE SURGERY     both eyes  . KNEE ARTHROSCOPY  1970's or 1980's   right; torn cartilage  . LAPAROSCOPIC APPENDECTOMY  07/10/2012   Procedure: APPENDECTOMY LAPAROSCOPIC;  Surgeon: Harl Bowie, MD;  Location: [facility];  Service:  General;  Laterality: N/A;  . MOHS SURGERY  01/2016   nose  . MOLE REMOVAL  1957   "my back"  . SKIN CANCER EXCISION     "2 on my back; 2 on my face"  . TONSILLECTOMY AND ADENOIDECTOMY  1946  . WISDOM TOOTH EXTRACTION  1959    FAMILY HISTORY: Family History  Problem Relation Age of Onset  . Prostate cancer Father   . Cancer Father        prostate  . Cancer Mother        waldenstruns microgobular anemia  . Hyperparathyroidism Mother   . Alcohol abuse Other   . Heart disease Other   . Hypertension Other   . Diabetes Other   . Pancreatic cancer Other   . Hyperparathyroidism Sister   . Cancer Maternal Grandfather        pancreatic    SOCIAL HISTORY: Social History   Socioeconomic History  . Marital status: Single    Spouse name: Not on file  . Number of children: 0  . Years of education: 57  . Highest education level: Not on file  Occupational History    Comment: retired Pharmacist, hospital, 5th grade  Social Needs  . Financial resource strain: Not on file  . Food insecurity:    Worry: Not on file    Inability: Not on file  . Transportation needs:    Medical: Not on file     Non-medical: Not on file  Tobacco Use  . Smoking status: Former Smoker    Packs/day: 0.75    Years: 20.00    Pack years: 15.00    Types: Cigarettes    Last attempt to quit: 07/25/1982    Years since quitting: 36.3  . Smokeless tobacco: Never Used  . Tobacco comment: 07/10/2012 "stopped smoking cigarettes 1980's"  Substance and Sexual Activity  . Alcohol use: Yes    Comment: 07/10/2012 "glass of wine 1-2X/yr"  . Drug use: No  . Sexual activity: Never  Lifestyle  . Physical activity:    Days per week: Not on file    Minutes per session: Not on file  . Stress: Not on file  Relationships  . Social connections:    Talks on phone: Not on file    Gets together: Not on file    Attends religious service: Not on file    Active member of club or organization: Not on file    Attends meetings of clubs or organizations: Not on file    Relationship status: Not on file  . Intimate partner violence:    Fear of current or ex partner: Not on file    Emotionally abused: Not on file    Physically abused: Not on file    Forced sexual activity: Not on file  Other Topics Concern  . 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  Generalized: Well developed, in no acute distress   Neurological examination  Mentation: Alert oriented to time, place, history taking. Follows all commands speech and language fluent Cranial nerve II-XII: Pupils were equal round reactive to light. Extraocular movements were full, visual field were full on confrontational test. Facial sensation and  strength were normal. Uvula tongue midline. Head turning and shoulder shrug  were normal and symmetric. Motor: The motor testing reveals 5 over 5 strength of all 4 extremities. Good symmetric  motor tone is noted throughout.  Sensory: Sensory testing is intact to soft touch on all 4 extremities. No evidence of extinction is noted.  Coordination: Cerebellar testing reveals good finger-nose-finger and heel-to-shin bilaterally.  Gait and station: Gait is normal.  Reflexes: Deep tendon reflexes are symmetric and normal bilaterally.   DIAGNOSTIC DATA (LABS, IMAGING, TESTING) - I reviewed patient records, labs, notes, testing and imaging myself where available.  Lab Results  Component Value Date   WBC 7.1 05/22/2018   HGB 12.0 05/22/2018   HCT 36.6 05/22/2018   MCV 93.9 05/22/2018   PLT 262.0 05/22/2018      Component Value Date/Time   NA 144 05/22/2018 1206   K 3.9 05/22/2018 1206   CL 112 05/22/2018 1206   CO2 24 05/22/2018 1206   GLUCOSE 93 05/22/2018 1206   BUN 23 05/22/2018 1206   CREATININE 1.28 (H) 05/22/2018 1206   CALCIUM 10.5 05/22/2018 1206   PROT 7.1 05/22/2018 1206   ALBUMIN 3.8 05/22/2018 1206   AST 18 05/22/2018 1206   ALT 12 05/22/2018 1206   ALKPHOS 90 05/22/2018 1206   BILITOT 0.3 05/22/2018 1206   GFRNONAA 53 (L) 07/10/2012 1120   GFRAA 61 (L) 07/10/2012 1120   Lab Results  Component Value Date   CHOL 150 05/22/2018   HDL 68.50 05/22/2018   LDLCALC 61 05/22/2018   LDLDIRECT 143.2 12/19/2007   TRIG 103.0 05/22/2018   CHOLHDL 2 05/22/2018   Lab Results  Component Value Date   HGBA1C 6.1 05/22/2018    Lab Results  Component Value Date   TSH 1.87 01/26/2018      ASSESSMENT AND PLAN 78 y.o. year old female  has a past medical history of ALLERGIC RHINITIS, CHRONIC, ANEMIA, Arthritis, BCC (basal cell carcinoma), lip (04/2015), Chronic sinusitis, CKD (chronic kidney disease) (2018), COPD (chronic obstructive pulmonary disease) ([place]) (dx 2013), Episodic tension type headache, EXOGENOUS OBESITY, GERD, HYPERLIPIDEMIA, Hypertension, Migraines (07/10/2012), OSTEOPENIA, and Pneumonia (2018-2019). here with :  1.  Morning headaches 2.  Daily  headaches most likely d/t medication overuse 3.  Migraine headaches  The patient was advised that she should limit her dose of Excedrin Migraine as this may be causing rebound headaches.  I discussed other methods to alleviate her headache such as using a hot or cold compress, decreased stimulation and laying in a dark room.  The patient will continue on Aimovig and Topamax.  I will discontinue Maxalt.  She is waking up with morning headaches that progresses throughout the day.  She does report daytime sleepiness but is unsure about snoring.  I will send her for sleep evaluation as sleep apnea may be contributing to her daily headaches?  She is advised that if her symptoms worsen or she develops new symptoms she should let us know.  She will follow-up in 6 months or sooner if needed.   Ward Givens, MSN, NP-C 11/15/2018, 1:05 PM [facility] [address] [place],  [address] [phone]

## 2018-11-17 NOTE — Progress Notes (Signed)
I reviewed note and agree with plan.   Penni Bombard, MD 8/90/2284, 06:98 PM Certified in Neurology, Neurophysiology and Neuroimaging  [facility] [address], [place], [place] [address] [phone]

## 2018-11-20 NOTE — Progress Notes (Signed)
Subjective:    Patient ID: Joy Martin, female    DOB: July 19, 1941, 78 y.o.   MRN: [id number]  HPI The patient is here for follow up.  Loose stools: Since taking the last antibiotic she has been having loose stools.  It is not diarrhea and she denies any blood in the stool, but it has been persistent.  She is wondering if this is from the antibiotic and what she can do to improve it.  Prediabetes:  She is compliant with a low sugar/carbohydrate diet.  She is not exercising regularly.  Hypertension: She is taking her medication daily. She is compliant with a low sodium diet.  She denies chest pain, palpitations, and regular headaches. She is not exercising regularly.  She does not monitor her blood pressure at home.    CKD:  She sees nephrology.    Hyperlipidemia: She is taking her medication daily. She is compliant with a low fat/cholesterol diet. She is not exercising regularly. She denies myalgias.   GERD:  She is taking her medication daily as prescribed.  She denies any GERD symptoms and feels her GERD is well controlled.   Episodes of SOB, generalized weakness and lightheadedness:  It last 2-3 minutes.  Resolves with rest.  It occurs when she moves more than usual.  She has not identified any other pattern to when she has this.  Medications and allergies reviewed with patient and updated if appropriate.  Patient Active Problem List   Diagnosis Date Noted  . Loose stools 11/21/2018  . Hyperparathyroidism, primary ([place]) 10/29/2018  . Acute cystitis without hematuria 09/05/2018  . Community acquired pneumonia 02/21/2018  . Neuralgia 01/16/2018  . Neck pain 01/16/2018  . Prediabetes 07/27/2017  . Cough 07/27/2017  . Kyphosis 05/04/2017  . Chronic shoulder bursitis, left 02/15/2017  . 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 Migraine headache 01/26/2017  . Back pain 01/26/2017  . Chronic kidney disease 01/26/2017  . Hypertensive retinopathy 11/29/2016  .  Osteoarthritis of right knee 07/28/2016  . Basal cell carcinoma of skin 11/26/2015  . Abnormal CXR 01/27/2013  . Obesity 12/25/2009  . Essential hypertension, benign 12/24/2008  . ALLERGIC RHINITIS, CHRONIC 12/24/2008  . Hyperlipidemia 12/19/2007  . ANEMIA 12/19/2007  . GERD 12/19/2007  . Osteopenia 12/19/2007    Current Outpatient Medications on File Prior to Visit  Medication Sig Dispense Refill  . amLODipine (NORVASC) 5 MG tablet Take 1 tablet (5 mg total) by mouth daily. 90 tablet 3  . aspirin-acetaminophen-caffeine (EXCEDRIN MIGRAINE) 096-283-66 MG tablet Take by mouth every 6 (six) hours as needed for headache.    . cholecalciferol (VITAMIN D) 1000 units tablet Take 1,000 Units by mouth daily.    . ciclopirox (LOPROX) 0.77 % cream Apply 1 application topically 2 (two) times daily as needed. rash    . cycloSPORINE (RESTASIS) 0.05 % ophthalmic emulsion 1 drop 2 (two) times daily.      Marland Kitchen denosumab (PROLIA) 60 MG/ML SOSY injection Inject 60 mg into the skin every 6 (six) months.    Eduard Roux (AIMOVIG) 70 MG/ML SOAJ Inject 70 mg into the skin every 30 (thirty) days. 3 pen 3  . esomeprazole (NEXIUM) 40 MG capsule Take 40 mg by mouth every other day.    . famotidine (PEPCID) 40 MG tablet TAKE 1 TABLET BY MOUTH EVERY DAY 30 tablet 5  . Flaxseed, Linseed, (FLAXSEED OIL PO) Take 2 tablets by mouth daily.    . fluticasone (FLONASE) 50 MCG/ACT nasal spray Place 1  spray into both nostrils daily. 48 g 1  . nadolol (CORGARD) 40 MG tablet TAKE 1 TABLET BY MOUTH TWICE A DAY 180 tablet 1  . simvastatin (ZOCOR) 40 MG tablet TAKE 1 TABLET BY MOUTH EVERYDAY AT BEDTIME 90 tablet 1  . topiramate (TOPAMAX) 50 MG tablet Take 1 tablet (50 mg total) by mouth 2 (two) times daily. 180 tablet 4  . valsartan (DIOVAN) 320 MG tablet TAKE 1 TABLET BY MOUTH EVERY DAY 90 tablet 0   No current facility-administered medications on file prior to visit.     Past Medical History:  Diagnosis Date  . ALLERGIC  RHINITIS, CHRONIC   . ANEMIA   . Arthritis    "knees mainly; thumbs"  . 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 Chronic sinusitis    follows with ENT for same  . CKD (chronic kidney disease) 2018  . COPD (chronic obstructive pulmonary disease) ([place]) dx 2013   GOLD 1, follows with pulm for same  . Episodic tension type headache   . EXOGENOUS OBESITY   . GERD   . HYPERLIPIDEMIA   . Hypertension   . Migraines 07/10/2012   "over; last one was @ age 54"  . OSTEOPENIA   . Pneumonia 2018-2019   x 2    Past Surgical History:  Procedure Laterality Date  . APPENDECTOMY  07/10/2012  . BREAST CYST ASPIRATION  ?1980's   right  . EYE SURGERY     both eyes  . KNEE ARTHROSCOPY  1970's or 1980's   right; torn cartilage  . LAPAROSCOPIC APPENDECTOMY  07/10/2012   Procedure: APPENDECTOMY LAPAROSCOPIC;  Surgeon: Harl Bowie, MD;  Location: [facility];  Service: General;  Laterality: N/A;  . MOHS SURGERY  01/2016   nose  . MOLE REMOVAL  1957   "my back"  . SKIN CANCER EXCISION     "2 on my back; 2 on my face"  . TONSILLECTOMY AND ADENOIDECTOMY  1946  . [place] EXTRACTION  1959    Social History   Socioeconomic History  . Marital status: Single    Spouse name: Not on file  . Number of children: 0  . Years of education: 59  . Highest education level: Not on file  Occupational History    Comment: retired Pharmacist, hospital, 5th grade  Social Needs  . Financial resource strain: Not on file  . Food insecurity:    Worry: Not on file    Inability: Not on file  . Transportation needs:    Medical: Not on file    Non-medical: Not on file  Tobacco Use  . Smoking status: Former Smoker    Packs/day: 0.75    Years: 20.00    Pack years: 15.00    Types: Cigarettes    Last attempt to quit: 07/25/1982    Years since quitting: 36.3  . Smokeless tobacco: Never Used  . Tobacco comment: 07/10/2012 "stopped smoking cigarettes 1980's"  Substance and  Sexual Activity  . Alcohol use: Yes    Comment: 07/10/2012 "glass of wine 1-2X/yr"  . Drug use: No  . Sexual activity: Never  Lifestyle  . Physical activity:    Days per week: Not on file    Minutes per session: Not on file  . Stress: Not on file  Relationships  . Social connections:    Talks on phone: Not on file    Gets together: Not on file    Attends  religious service: Not on file    Active member of club or organization: Not on file    Attends meetings of clubs or organizations: Not on file    Relationship status: Not on file  Other Topics Concern  . 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 Prostate cancer Father   . Cancer Father        prostate  . Cancer Mother        waldenstruns microgobular anemia  . Hyperparathyroidism Mother   . Alcohol abuse Other   . Heart disease Other   . Hypertension Other   . Diabetes Other   . Pancreatic cancer Other   . Hyperparathyroidism Sister   . Cancer Maternal Grandfather        pancreatic    Review of Systems  Constitutional: Negative for chills and fever.  Respiratory: Positive for cough (dry, tickle cough - getting better) and shortness of breath (sometimes when walking). Negative for wheezing.   Cardiovascular: Positive for leg swelling (mild at times). Negative for chest pain and palpitations.  Gastrointestinal: Negative for abdominal pain, blood in stool, diarrhea (loose stools) and nausea.  Neurological: Positive for light-headedness (occ with episodes).       Objective:   Vitals:   11/21/18 1326  BP: (!) 158/82  Pulse: (!) 56  Resp: 16  Temp: 98 F (36.7 C)  SpO2: 99%   BP Readings from Last 3 Encounters:  11/21/18 (!) 158/82  11/15/18 140/77  10/27/18 136/82   Wt Readings from Last 3 Encounters:  11/21/18 140 lb (63.5 kg)  11/15/18 139 lb 12.8 oz (63.4 kg)  10/27/18 140 lb 9.6  oz (63.8 kg)   Body mass index is 26.45 kg/m.   Physical Exam    Constitutional: Appears well-developed and well-nourished. No distress.  HENT:  Head: Normocephalic and atraumatic.  Neck: Neck supple. No tracheal deviation present. No thyromegaly present.  No cervical lymphadenopathy Cardiovascular: Normal rate, regular rhythm and normal heart sounds.   No murmur heard. No carotid bruit .  No edema Pulmonary/Chest: Effort normal and breath sounds normal. No respiratory distress. No has no wheezes. No rales.  Skin: Skin is warm and dry. Not diaphoretic.  Psychiatric: Normal mood and affect. Behavior is normal.      Assessment & Plan:    See Problem List for Assessment and Plan of chronic medical problems.

## 2018-11-20 NOTE — Patient Instructions (Addendum)
For your loose stools - this may be from the antibiotic.  Try yogurt.  You can also try Kefir. Try a probiotic for 2-4 weeks ( Align, florastor).    You can also try taking metamucil daily.     Your a1c was checked today.    Medications reviewed and updated.  Changes include :   none    Please followup in 6 months

## 2018-11-21 ENCOUNTER — Ambulatory Visit: Payer: Medicare Other | Admitting: Internal Medicine

## 2018-11-21 ENCOUNTER — Encounter: Payer: Self-pay | Admitting: Internal Medicine

## 2018-11-21 VITALS — BP 158/82 | HR 56 | Temp 98.0°F | Resp 16 | Ht 61.0 in | Wt 140.0 lb

## 2018-11-21 DIAGNOSIS — R7303 Prediabetes: Secondary | ICD-10-CM | POA: Diagnosis not present

## 2018-11-21 DIAGNOSIS — R195 Other fecal abnormalities: Secondary | ICD-10-CM

## 2018-11-21 DIAGNOSIS — K219 Gastro-esophageal reflux disease without esophagitis: Secondary | ICD-10-CM

## 2018-11-21 DIAGNOSIS — N189 Chronic kidney disease, unspecified: Secondary | ICD-10-CM

## 2018-11-21 DIAGNOSIS — I10 Essential (primary) hypertension: Secondary | ICD-10-CM

## 2018-11-21 DIAGNOSIS — E7849 Other hyperlipidemia: Secondary | ICD-10-CM

## 2018-11-21 LAB — POCT GLYCOSYLATED HEMOGLOBIN (HGB A1C): Hemoglobin A1C: 5.5 % (ref 4.0–5.6)

## 2018-11-21 NOTE — Assessment & Plan Note (Signed)
Elevated here-tends to be elevated at the doctor's office We will continue current medication Monitor CMP done recently by nephrology-reviewed and was improved

## 2018-11-21 NOTE — Assessment & Plan Note (Signed)
A1c here today very good Continue diabetic diet Increase exercise

## 2018-11-21 NOTE — Assessment & Plan Note (Signed)
Lipid panel has been well controlled Continue simvastatin at current dose

## 2018-11-21 NOTE — Assessment & Plan Note (Signed)
GERD controlled Continue daily medication  

## 2018-11-21 NOTE — Assessment & Plan Note (Signed)
Loose stool started after taking antibiotic-discussed that this may be related to disturbance of the natural flora in the colon Can start a probiotic, eat yogurt or drink kefir Can try Metamucil Call if no improvement

## 2018-11-21 NOTE — Assessment & Plan Note (Signed)
Following with nephrology Kidney function checked last month by nephrology-slightly improved No need for additional blood work today

## 2018-12-12 LAB — HM MAMMOGRAPHY

## 2018-12-15 ENCOUNTER — Encounter: Payer: Self-pay | Admitting: Internal Medicine

## 2018-12-28 ENCOUNTER — Institutional Professional Consult (permissible substitution): Payer: Medicare Other | Admitting: Neurology

## 2019-01-07 ENCOUNTER — Other Ambulatory Visit: Payer: Self-pay | Admitting: Internal Medicine

## 2019-02-02 ENCOUNTER — Other Ambulatory Visit: Payer: Self-pay | Admitting: Internal Medicine

## 2019-02-15 ENCOUNTER — Other Ambulatory Visit: Payer: Self-pay | Admitting: Internal Medicine

## 2019-02-27 ENCOUNTER — Other Ambulatory Visit: Payer: Self-pay | Admitting: Internal Medicine

## 2019-03-30 ENCOUNTER — Ambulatory Visit (INDEPENDENT_AMBULATORY_CARE_PROVIDER_SITE_OTHER): Payer: Medicare Other

## 2019-03-30 DIAGNOSIS — M810 Age-related osteoporosis without current pathological fracture: Secondary | ICD-10-CM | POA: Diagnosis not present

## 2019-03-30 MED ORDER — DENOSUMAB 60 MG/ML ~~LOC~~ SOSY
60.0000 mg | PREFILLED_SYRINGE | Freq: Once | SUBCUTANEOUS | Status: AC
  Administered 2019-03-30: 60 mg via SUBCUTANEOUS

## 2019-03-30 NOTE — Progress Notes (Signed)
prolia Injection given.   Cristine Daw J Vandell Kun, MD  

## 2019-04-01 ENCOUNTER — Other Ambulatory Visit: Payer: Self-pay | Admitting: Internal Medicine

## 2019-04-06 IMAGING — DX DG THORACIC SPINE 3V
3 series · 3 of 3 positions shown · non-contrast
Comparison: 06/28/2013

CLINICAL DATA: Back pain for several weeks, no known injury,
initial encounter

EXAM:
THORACIC SPINE - 3 VIEWS

[t-spine ap]
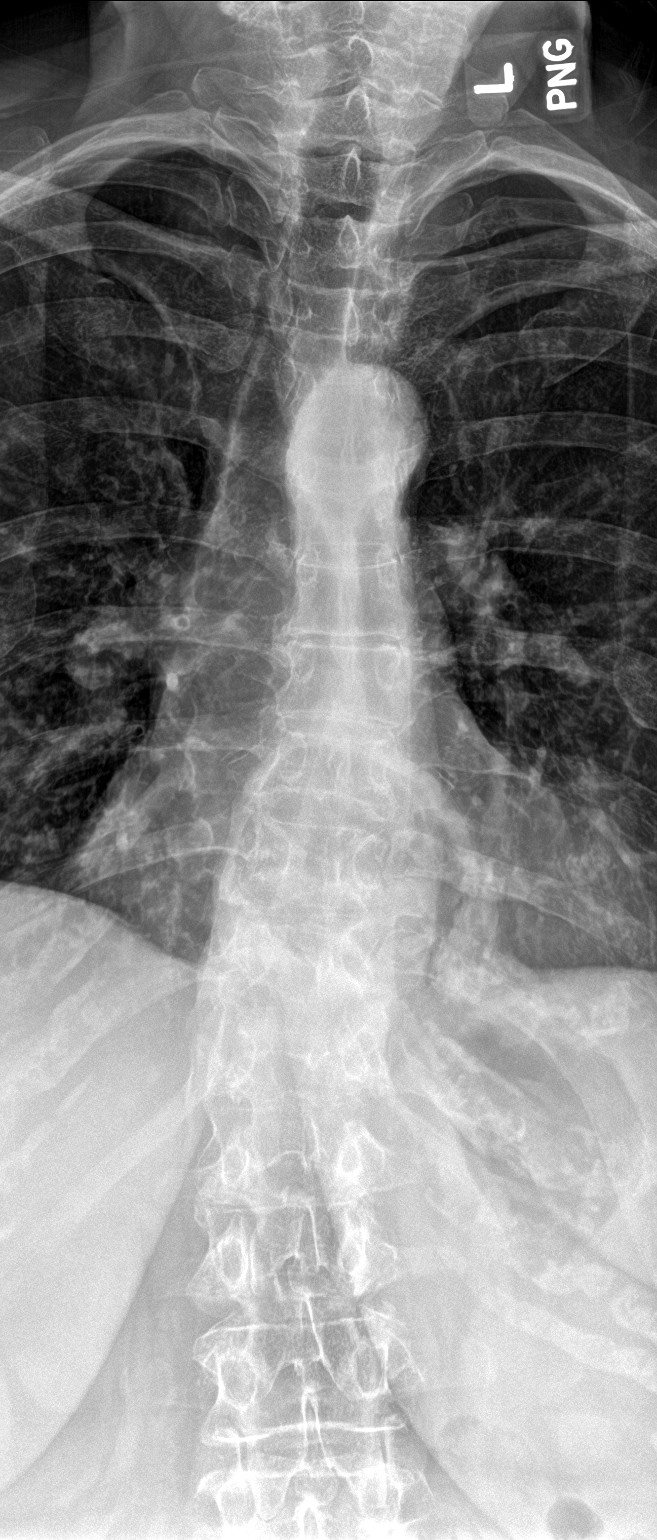

[t-spine lat]
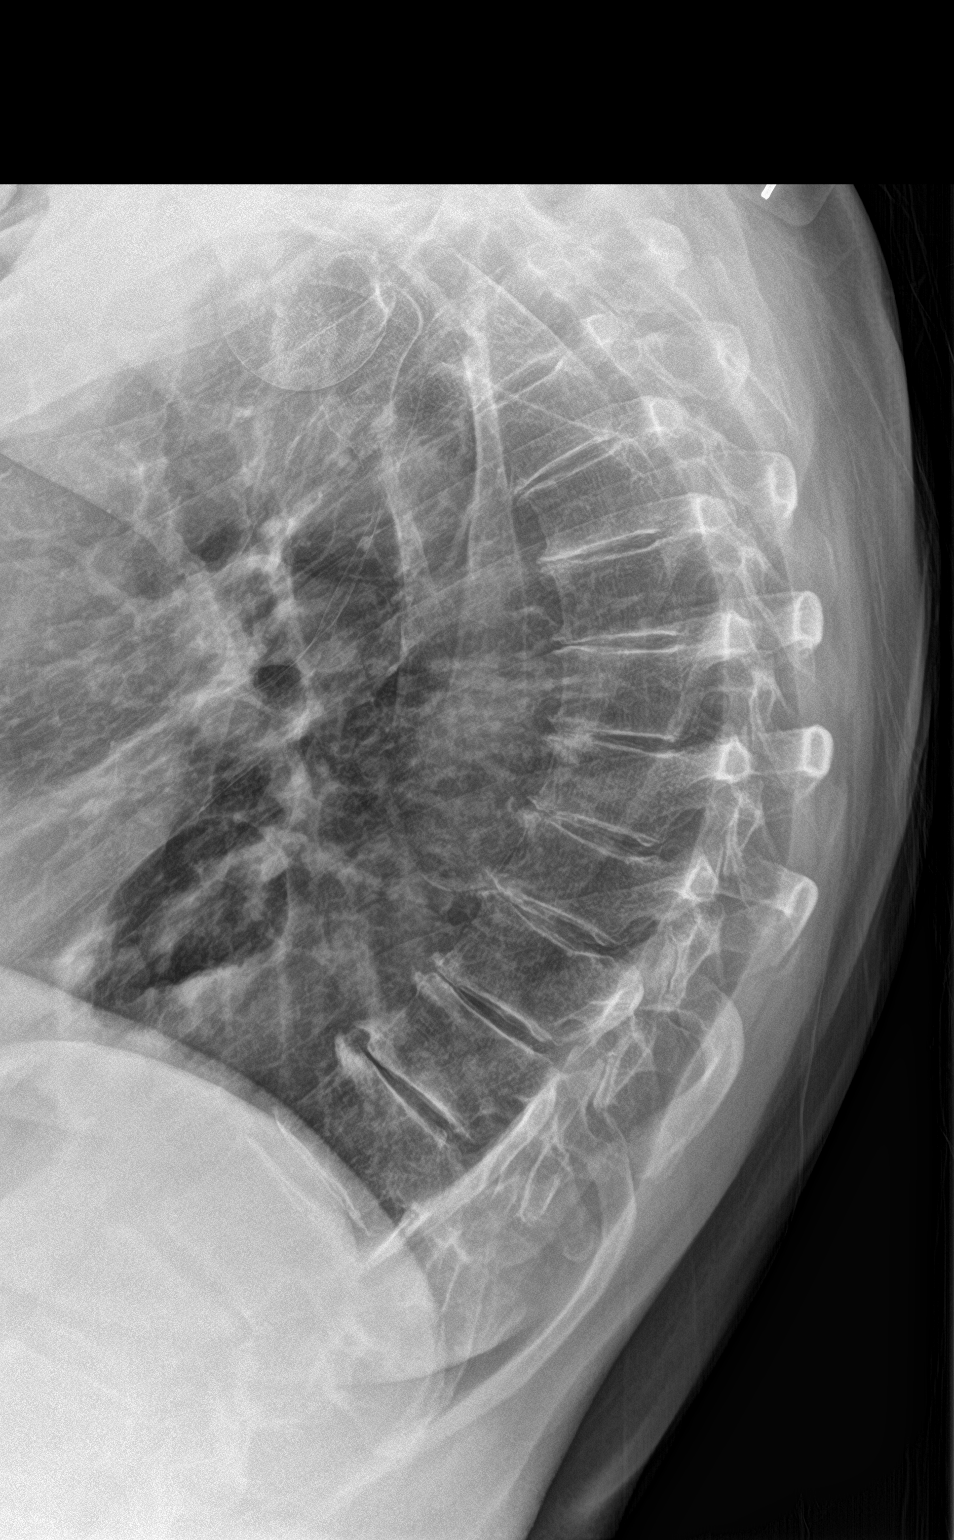

[swimmer]
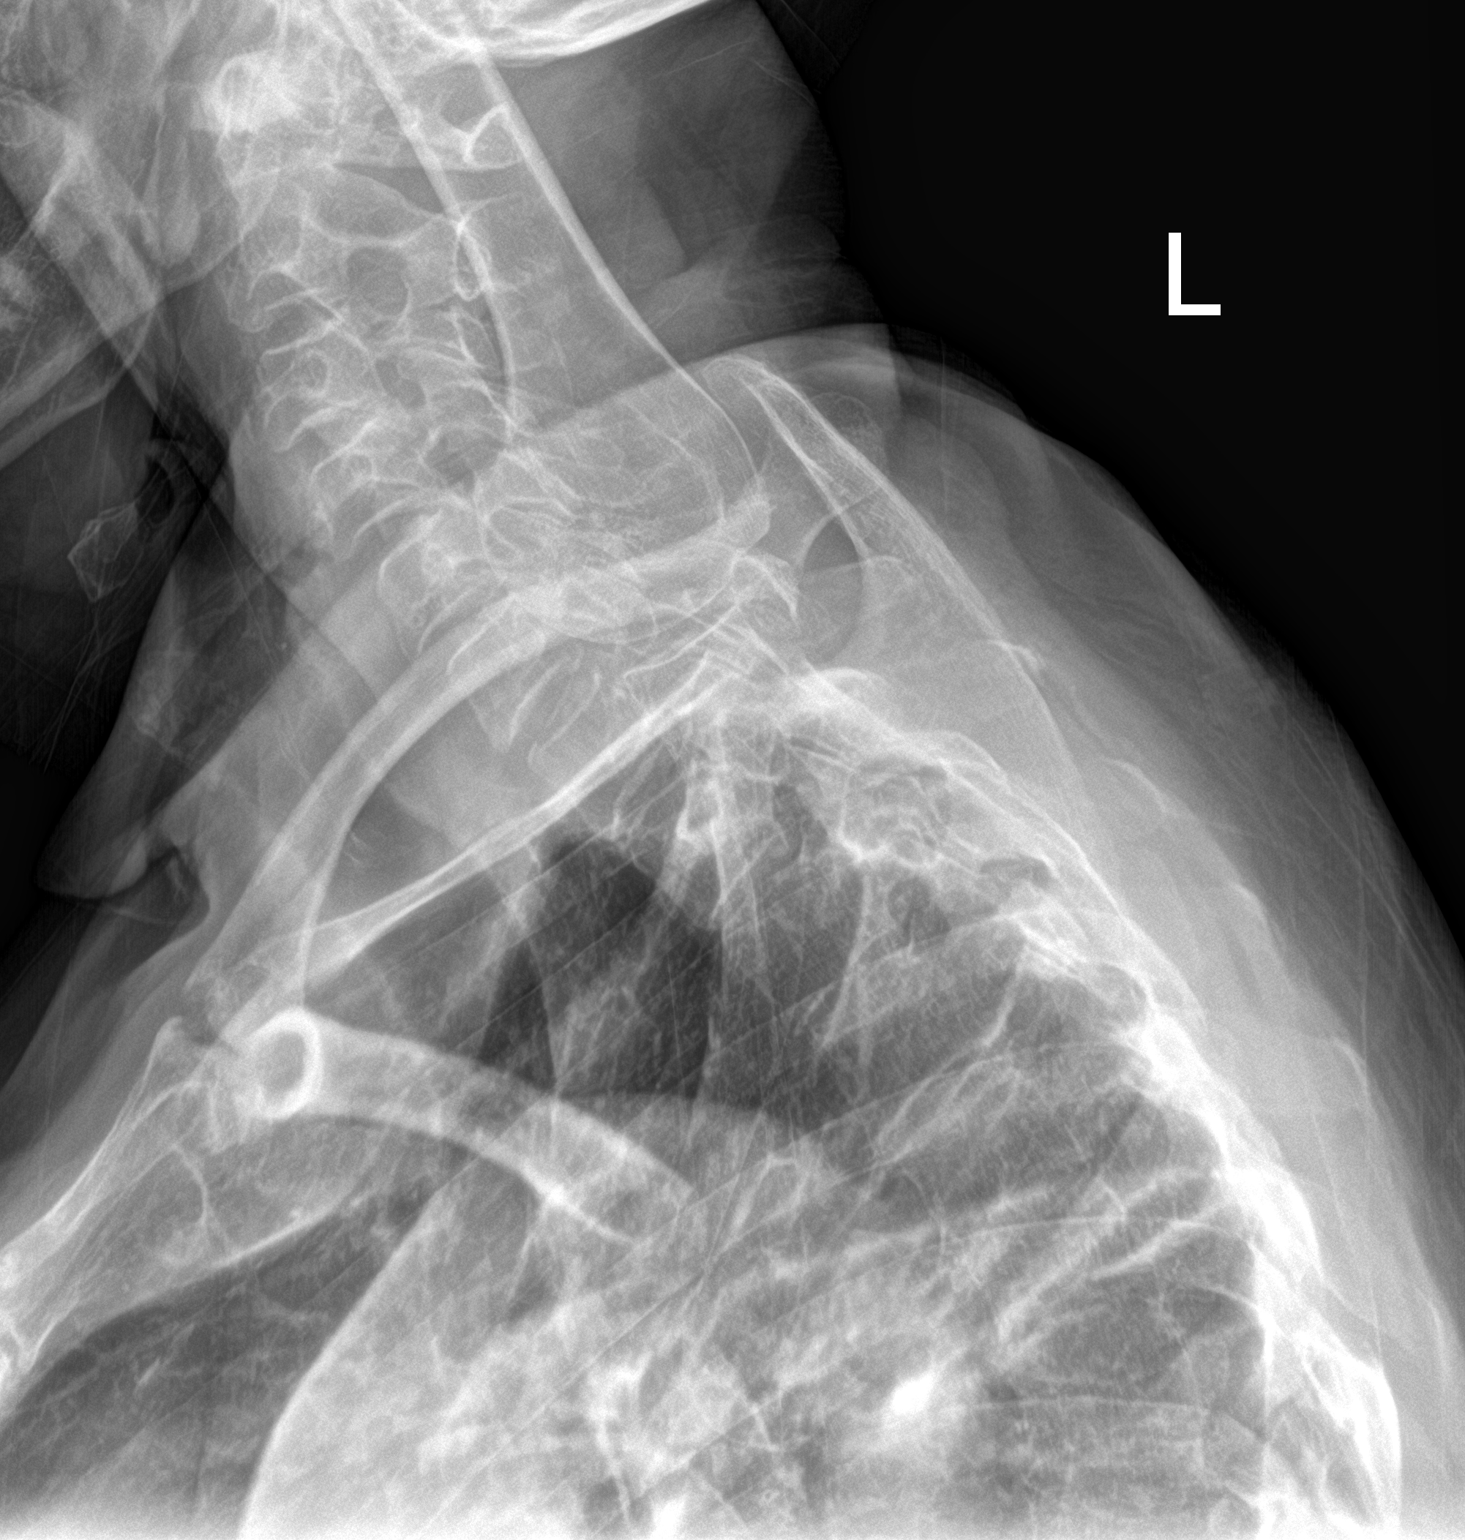

[3 of 3 positions shown; findings below may reference images not displayed]

FINDINGS: Cardiac shadow is at the upper limits of normal in size. Hiatal
hernia is again seen and stable. Vertebral body height is well
maintained. Multilevel degenerative changes are seen. No paraspinal
mass lesion is noted. The pedicles are within normal limits.
IMPRESSION: Mild degenerative change without acute abnormality.

## 2019-04-16 IMAGING — DX DG CHEST 2V
2 series · 2 of 2 positions shown · non-contrast
Comparison: 06/28/2013

CLINICAL DATA: Chronic cough, hypertension, COPD, former smoker

EXAM:
CHEST - 2 VIEW

[chest pa]
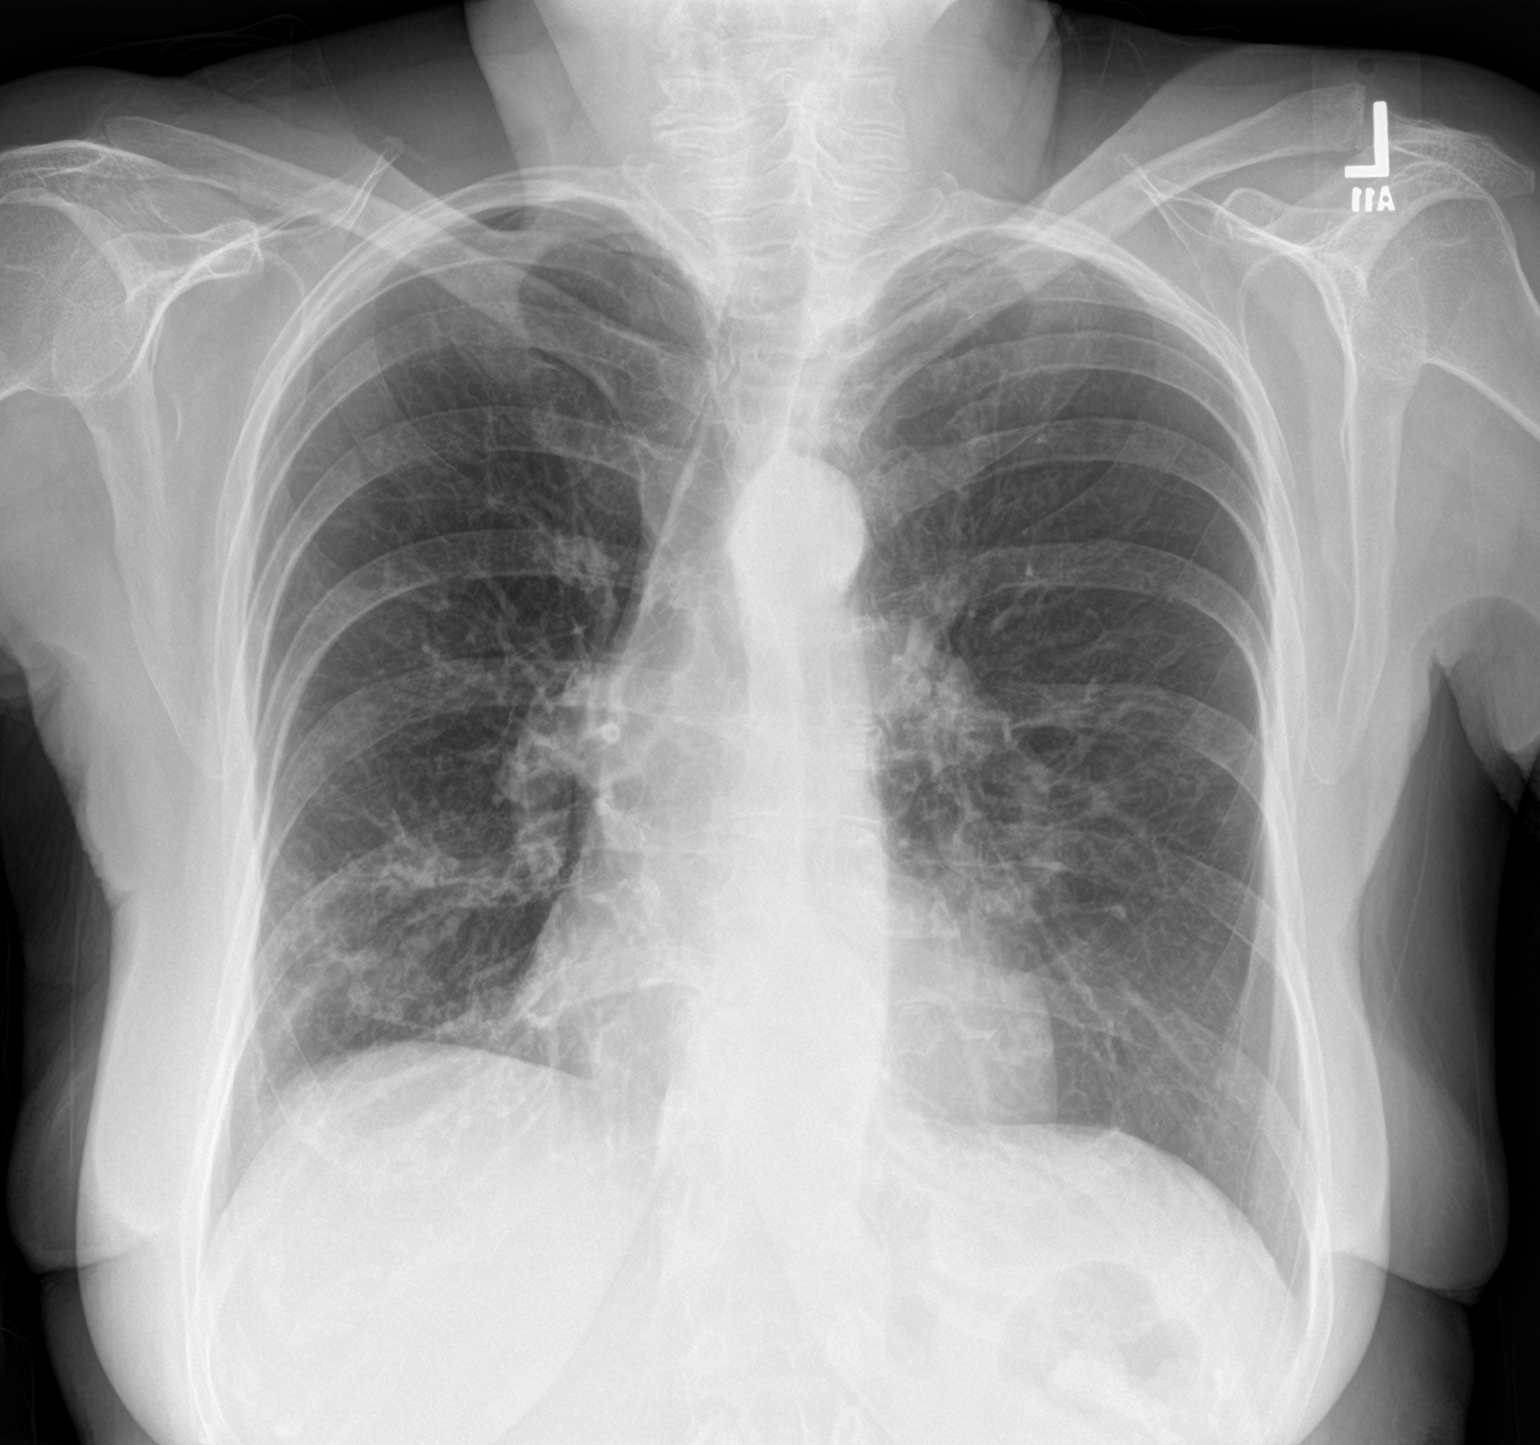

[chest lat]
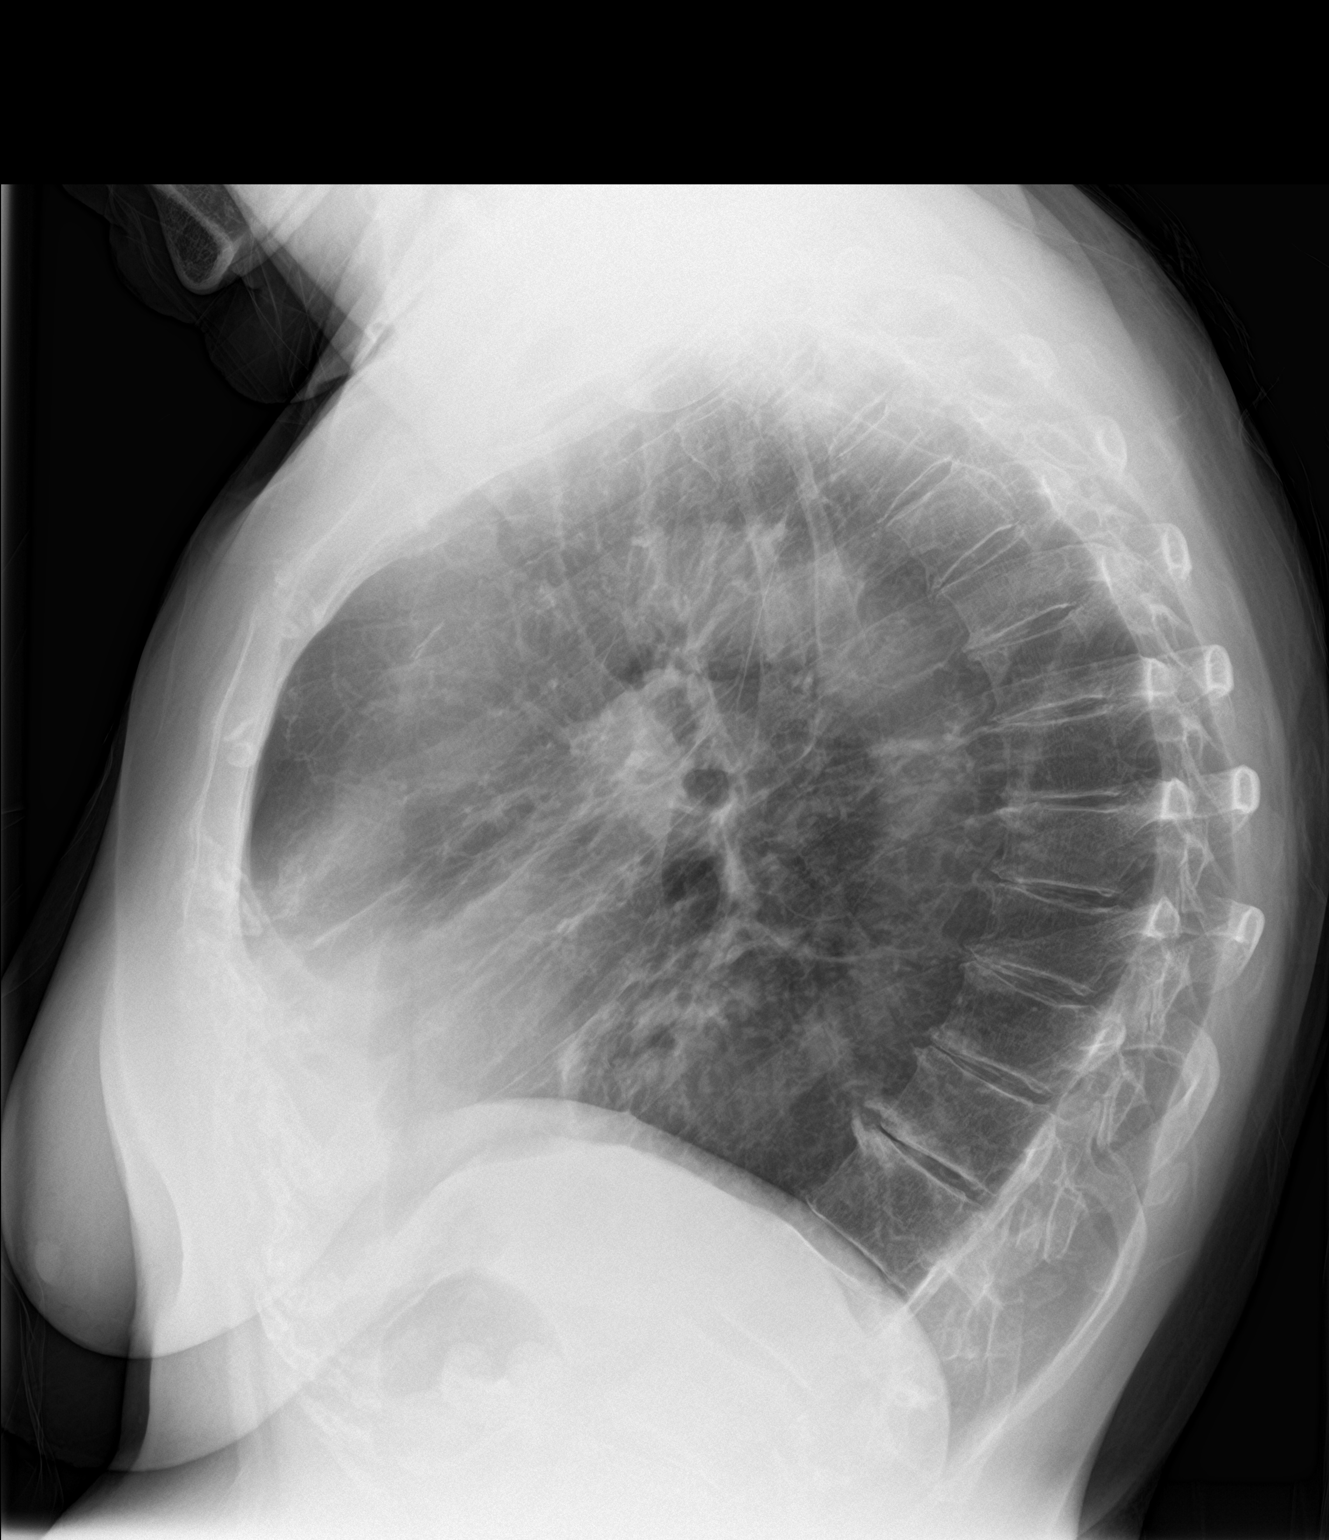

[2 of 2 positions shown; findings below may reference images not displayed]

FINDINGS: Upper normal heart size.

Mediastinal contours and pulmonary vascularity normal.

Emphysematous changes consistent with COPD.

Slight chronic accentuation of perihilar markings.

New opacity identified at the lateral RIGHT lung base question
pneumonia.

Remaining lungs clear.

No pleural effusion or pneumothorax.

Bones demineralized with accentuated thoracic kyphosis.
IMPRESSION: COPD changes with new opacity at lateral RIGHT lung base suspicious
for pneumonia.

## 2019-04-30 ENCOUNTER — Ambulatory Visit: Payer: Medicare Other | Admitting: Endocrinology

## 2019-05-07 ENCOUNTER — Encounter: Payer: Self-pay | Admitting: Endocrinology

## 2019-05-08 ENCOUNTER — Other Ambulatory Visit: Payer: Self-pay

## 2019-05-08 ENCOUNTER — Ambulatory Visit (INDEPENDENT_AMBULATORY_CARE_PROVIDER_SITE_OTHER): Payer: Medicare Other | Admitting: Endocrinology

## 2019-05-08 DIAGNOSIS — E210 Primary hyperparathyroidism: Secondary | ICD-10-CM | POA: Diagnosis not present

## 2019-05-08 NOTE — Patient Instructions (Addendum)
Blood tests are requested for you today.  We'll let you know about the results.  If it is mild, all we need to do is to keep an eye on this. Please come back for a follow-up appointment in 6 months.

## 2019-05-08 NOTE — Progress Notes (Signed)
Subjective:    Patient ID: Joy Martin, female    DOB: [birth year], 78 y.o.   MRN: [id number]  HPI telehealth visit today via phone x 9 minutes.   Alternatives to telehealth are presented to this patient, and the patient agrees to the telehealth visit.  Pt is advised of the cost of the visit, and agrees to this, also.   Patient is at home, and I am at the office.   Persons attending the telehealth visit: the patient and I.  Pt returns for f/u of primary hyperparathyroidism (dx'ed 2012 (it was normal in 2011); she has never had urolithiasis or bony fracture; she took fosamax 2012-2017.  HCTZ was stopped in early 2019).  She takes 1000 units per day.  pt states she feels well in general, except for fatigue.  She takes vit-D, 1000 units/day.  She takes Prolia for osteoporosis.    Past Medical History:  Diagnosis Date  . ALLERGIC RHINITIS, CHRONIC   . ANEMIA   . Arthritis    "knees mainly; thumbs"  . 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 Chronic sinusitis    follows with ENT for same  . CKD (chronic kidney disease) 2018  . COPD (chronic obstructive pulmonary disease) ([place]) dx 2013   GOLD 1, follows with pulm for same  . Episodic tension type headache   . EXOGENOUS OBESITY   . GERD   . HYPERLIPIDEMIA   . Hypertension   . Migraines 07/10/2012   "over; last one was @ age 17"  . OSTEOPENIA   . Pneumonia 2018-2019   x 2    Past Surgical History:  Procedure Laterality Date  . APPENDECTOMY  07/10/2012  . BREAST CYST ASPIRATION  ?1980's   right  . EYE SURGERY     both eyes  . KNEE ARTHROSCOPY  1970's or 1980's   right; torn cartilage  . LAPAROSCOPIC APPENDECTOMY  07/10/2012   Procedure: APPENDECTOMY LAPAROSCOPIC;  Surgeon: Harl Bowie, MD;  [facility];  Service: General;  Laterality: N/A;  . MOHS SURGERY  01/2016   nose  . MOLE REMOVAL  1957   "my back"  . SKIN CANCER EXCISION     "2 on my back; 2 on my face"   . TONSILLECTOMY AND ADENOIDECTOMY  1946  . [place] EXTRACTION  1959    Social History   Socioeconomic History  . Marital status: Single    Spouse name: Not on file  . Number of children: 0  . Years of education: 37  . Highest education level: Not on file  Occupational History    Comment: retired Pharmacist, hospital, 5th grade  Social Needs  . Financial resource strain: Not on file  . Food insecurity    Worry: Not on file    Inability: Not on file  . Transportation needs    Medical: Not on file    Non-medical: Not on file  Tobacco Use  . Smoking status: Former Smoker    Packs/day: 0.75    Years: 20.00    Pack years: 15.00    Types: Cigarettes    Quit date: 07/25/1982    Years since quitting: 36.8  . Smokeless tobacco: Never Used  . Tobacco comment: 07/10/2012 "stopped smoking cigarettes 1980's"  Substance and Sexual Activity  . Alcohol use: Yes    Comment: 07/10/2012 "glass of wine 1-2X/yr"  . Drug use: No  . Sexual activity: Never  Lifestyle  .  Physical activity    Days per week: Not on file    Minutes per session: Not on file  . Stress: Not on file  Relationships  . Social Herbalist on phone: Not on file    Gets together: Not on file    Attends religious service: Not on file    Active member of club or organization: Not on file    Attends meetings of clubs or organizations: Not on file    Relationship status: Not on file  . Intimate partner violence    Fear of current or ex partner: Not on file    Emotionally abused: Not on file    Physically abused: Not on file    Forced sexual activity: Not on file  Other Topics Concern  . 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 amLODipine (NORVASC) 5 MG tablet TAKE 1 TABLET BY MOUTH EVERY DAY 90 tablet 3  . aspirin-acetaminophen-caffeine (EXCEDRIN  MIGRAINE) 509-326-71 MG tablet Take by mouth every 6 (six) hours as needed for headache.    . cholecalciferol (VITAMIN D) 1000 units tablet Take 1,000 Units by mouth daily.    . ciclopirox (LOPROX) 0.77 % cream Apply 1 application topically 2 (two) times daily as needed. rash    . cycloSPORINE (RESTASIS) 0.05 % ophthalmic emulsion 1 drop 2 (two) times daily.      Marland Kitchen denosumab (PROLIA) 60 MG/ML SOSY injection Inject 60 mg into the skin every 6 (six) months.    Eduard Roux (AIMOVIG) 70 MG/ML SOAJ Inject 70 mg into the skin every 30 (thirty) days. 3 pen 3  . esomeprazole (NEXIUM) 40 MG capsule Take 1 capsule (40 mg total) by mouth 2 (two) times daily before a meal. 180 capsule 1  . famotidine (PEPCID) 40 MG tablet TAKE 1 TABLET BY MOUTH EVERY DAY 90 tablet 1  . Flaxseed, Linseed, (FLAXSEED OIL PO) Take 2 tablets by mouth daily.    . fluticasone (FLONASE) 50 MCG/ACT nasal spray Place 1 spray into both nostrils daily. 48 g 1  . nadolol (CORGARD) 40 MG tablet TAKE 1 TABLET BY MOUTH TWICE A DAY 180 tablet 1  . simvastatin (ZOCOR) 40 MG tablet TAKE 1 TABLET BY MOUTH EVERYDAY AT BEDTIME 90 tablet 0  . topiramate (TOPAMAX) 50 MG tablet Take 1 tablet (50 mg total) by mouth 2 (two) times daily. 180 tablet 4  . valsartan (DIOVAN) 320 MG tablet TAKE 1 TABLET BY MOUTH EVERY DAY 90 tablet 1   No current facility-administered medications on file prior to visit.     Allergies  Allergen Reactions  . Allegra [Fexofenadine Hcl] Other (See Comments)    Headache, "took the pill; I got flu symptoms"  . Prilosec [Omeprazole]     Fecal incontince  . Ranitidine     Fecal incontince    Family History  Problem Relation Age of Onset  . Prostate cancer Father   . Cancer Father        prostate  . Cancer Mother        waldenstruns microgobular anemia  . Hyperparathyroidism Mother   . Alcohol abuse Other   . Heart disease Other   . Hypertension Other   . Diabetes Other   . Pancreatic cancer Other   .  Hyperparathyroidism Sister   .  Cancer Maternal Grandfather        pancreatic    Review of Systems     Objective:   Physical Exam      Assessment & Plan:  Hyperparathyroidism and vit-D def: due for recheck.    Patient Instructions  Blood tests are requested for you today.  We'll let you know about the results.  If it is mild, all we need to do is to keep an eye on this. Please come back for a follow-up appointment in 6 months.

## 2019-05-16 ENCOUNTER — Other Ambulatory Visit: Payer: Self-pay

## 2019-05-16 ENCOUNTER — Ambulatory Visit: Payer: Medicare Other | Admitting: Family Medicine

## 2019-05-16 ENCOUNTER — Encounter: Payer: Self-pay | Admitting: Family Medicine

## 2019-05-16 VITALS — BP 135/76 | HR 59 | Temp 97.8°F | Ht 61.0 in | Wt 133.0 lb

## 2019-05-16 DIAGNOSIS — G43809 Other migraine, not intractable, without status migrainosus: Secondary | ICD-10-CM

## 2019-05-16 NOTE — Progress Notes (Signed)
PATIENT: Joy Martin DOB: 1941/02/08  REASON FOR VISIT: follow up HISTORY FROM: patient  Chief Complaint  Patient presents with   Follow-up    6 mon f/u. Alone. Rm 1. No new concerns at this time.      HISTORY OF PRESENT ILLNESS: Today 05/17/19 Joy Martin is a 78 y.o. female here today for follow up of chronic migraines. She reports that her headaches have decreased in frequency. She is tolerating topiramate and Amovig well. She does continue daily use of Excedrin but has decreased the frequency. She is averaging two tablets daily. She feels that she has 5-6 headaches a week. She has not pursued sleep referral. She does not wish to have a sleep study at this time. She does not feel headaches are debilitating and feels fairly well. Left shoulder pain has improved.    HISTORY: (copied from [place] note on 11/15/2018)  Ms. Bronkema is a 78 year old female with a history of headaches.  She returns today for follow-up.  She is been taking AImovig last 5 months.  She does not notice any change in her headaches.  She continues to have daily headaches.  She states that she typically wakes up with a headache and then the headache will progress to either a true migraine whereas on the right side of the head.  She typically will take Excedrin Migraine.  She reports that Maxalt has given her no benefit.  She states sometimes her headaches will progress to neck pain and then into a migraine.  She continues to take Excedrin Migraine daily.  She states that she has tried to limit her dose but that is the only thing that helps with her headaches.  She does state that she tends to fall asleep during the day.  She does not plan to take naps but does fall asleep if she sits down.  She does not have anyone that lives with her.  She is unsure if she snores.  She does state that she has to get up multiple times at night for urinary issues.  She returns today for evaluation.  HISTORY UPDATE  (04/18/18, [facility]P): Since last visit, doingabout the same. Toleratingmeds.Still with daily headaches (right parietal soreness and burned sensation, sometimes pounding) and daily Excedrin usage. Left shoulder pain improving.No alleviating or aggravating factors.  UPDATE 04/12/17: Since last visit, HA are improving. On TPX 50mg  twice a day. Continues on excedrin 2-4 tabs per day. Now with more left shoulder pain, radiating to neck and head. Seeing sports medicine clinic and trying home exercises.  UPDATE 12/07/16: Since last visit visit, continues with daily headaches. Has slightly reduced excedrin to 2-4 tabs per day. Tolerating TPX 25mg  daily.   PRIOR HPI (08/27/16): 78 year old right-handed female here for evaluation of headaches. Around age 70 years old patient had onset of right-sided headaches with right eye pain, throbbing, nausea, proceeded by visual disturbance and aura. Patient was diagnosed with migraine headaches. During factors include menstrual cycle and red wine. She averaged 2-3 headaches per month each lasting 1-2 days. Patient was evaluated by the headache clinic in the past and tried nadolol, Imitrex, over-the-counter medications. By age 44 years old patient was going through menopause and her migraine headaches subsided. Over many years patient has also had lower grade dull sore and intermittent stabbing headaches sometimes on the right side, sometimes on the top of her head. No other associated factors. These been going on for at least 10-20 years. Patient has gradually built up  to using 2-6 tablets of Excedrin over-the-counter on a daily basis. This is been going on for many many years. Patient has tried weaning off in the past but this caused rebound headaches. Patient also was taking Fioricet 20 tablets per month for many years. Patient now establish with a new PCP who is reviewing medication list and advising patient to reduce medication overusage which may be triggering analgesic  overuse headache and rebound headache. Patient referred to me for further evaluation consideration of prophylactic headache therapy.   REVIEW OF SYSTEMS: Out of a complete 14 system review of symptoms, the patient complains only of the following symptoms, headaches, weakness in hands and all other reviewed systems are negative.  ALLERGIES: Allergies  Allergen Reactions   Allegra [Fexofenadine Hcl] Other (See Comments)    Headache, "took the pill; I got flu symptoms"   Prilosec [Omeprazole]     Fecal incontince   Ranitidine     Fecal incontince    HOME MEDICATIONS: Outpatient Medications Prior to Visit  Medication Sig Dispense Refill   amLODipine (NORVASC) 5 MG tablet TAKE 1 TABLET BY MOUTH EVERY DAY 90 tablet 3   aspirin-acetaminophen-caffeine (EXCEDRIN MIGRAINE) 250-250-65 MG tablet Take by mouth every 6 (six) hours as needed for headache.     cholecalciferol (VITAMIN D) 1000 units tablet Take 1,000 Units by mouth daily.     ciclopirox (LOPROX) 0.77 % cream Apply 1 application topically 2 (two) times daily as needed. rash     cycloSPORINE (RESTASIS) 0.05 % ophthalmic emulsion 1 drop 2 (two) times daily.       denosumab (PROLIA) 60 MG/ML SOSY injection Inject 60 mg into the skin every 6 (six) months.     Erenumab-aooe (AIMOVIG) 70 MG/ML SOAJ Inject 70 mg into the skin every 30 (thirty) days. 3 pen 3   esomeprazole (NEXIUM) 40 MG capsule Take 1 capsule (40 mg total) by mouth 2 (two) times daily before a meal. 180 capsule 1   famotidine (PEPCID) 40 MG tablet TAKE 1 TABLET BY MOUTH EVERY DAY 90 tablet 1   Flaxseed, Linseed, (FLAXSEED OIL PO) Take 2 tablets by mouth daily.     fluticasone (FLONASE) 50 MCG/ACT nasal spray Place 1 spray into both nostrils daily. 48 g 1   nadolol (CORGARD) 40 MG tablet TAKE 1 TABLET BY MOUTH TWICE A DAY 180 tablet 1   simvastatin (ZOCOR) 40 MG tablet TAKE 1 TABLET BY MOUTH EVERYDAY AT BEDTIME 90 tablet 0   topiramate (TOPAMAX) 50 MG tablet  Take 1 tablet (50 mg total) by mouth 2 (two) times daily. 180 tablet 4   valsartan (DIOVAN) 320 MG tablet TAKE 1 TABLET BY MOUTH EVERY DAY 90 tablet 1   No facility-administered medications prior to visit.     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   removed right upper lip/perinostril [name] ([facility])   Chronic sinusitis    follows with ENT for same   CKD (chronic kidney disease) 2018   COPD (chronic obstructive pulmonary disease) ([place]) dx 2013   GOLD 1, follows with pulm for same   Episodic tension type headache    EXOGENOUS OBESITY    GERD    HYPERLIPIDEMIA    Hypertension    Migraines 07/10/2012   "over; last one was @ age 34"   OSTEOPENIA  Pneumonia 2018-2019   x 2    PAST SURGICAL HISTORY: Past Surgical History:  Procedure Laterality Date   APPENDECTOMY  07/10/2012   BREAST CYST ASPIRATION  ?1980's   right   EYE SURGERY     both eyes   KNEE ARTHROSCOPY  1970's or 1980's   right; torn cartilage   LAPAROSCOPIC APPENDECTOMY  07/10/2012   Procedure: APPENDECTOMY LAPAROSCOPIC;  Surgeon: Harl Bowie, MD;  Location: [facility];  Service: General;  Laterality: N/A;   [place] SURGERY  01/2016   nose   MOLE REMOVAL  1957   "my back"   SKIN CANCER EXCISION     "2 on my back; 2 on my face"   [place] HISTORY: Family History  Problem Relation Age of Onset   Prostate cancer Father    Cancer Father        prostate   Cancer Mother        waldenstruns microgobular anemia   Hyperparathyroidism Mother    Alcohol abuse Other    Heart disease Other    Hypertension Other    Diabetes Other    Pancreatic cancer Other    Hyperparathyroidism Sister    Cancer Maternal Grandfather        pancreatic    SOCIAL HISTORY: Social  History   Socioeconomic History   Marital status: Single    Spouse name: Not on file   Number of children: 0   Years of education: 16   Highest education level: Not on file  Occupational History    Comment: retired Pharmacist, hospital, 5th grade  Social Designer, fashion/clothing strain: Not on file   Food insecurity    Worry: Not on file    Inability: Not on file   Transportation needs    Medical: Not on file    Non-medical: Not on file  Tobacco Use   Smoking status: Former Smoker    Packs/day: 0.75    Years: 20.00    Pack years: 15.00    Types: Cigarettes    Quit date: 07/25/1982    Years since quitting: 36.8   Smokeless tobacco: Never Used   Tobacco comment: 07/10/2012 "stopped smoking cigarettes 1980's"  Substance and Sexual Activity   Alcohol use: Yes    Comment: 07/10/2012 "glass of wine 1-2X/yr"   Drug use: No   Sexual activity: Never  Lifestyle   Physical activity    Days per week: Not on file    Minutes per session: Not on file   Stress: Not on file  Relationships   Social connections    Talks on phone: Not on file    Gets together: Not on file    Attends religious service: Not on file    Active member of club or organization: Not on file    Attends meetings of clubs or organizations: Not on file    Relationship status: Not on file   Intimate partner violence    Fear of current or ex partner: Not on file    Emotionally abused: Not on file    Physically abused: Not on file    Forced sexual activity: Not on file  Other Topics Concern   Not on file  Social History Narrative   Retired Pharmacist, hospital, 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  Generalized: Well developed, in no acute distress  Cardiology: normal rate and rhythm, no murmur  noted Neurological examination  Mentation: Alert oriented to time, place, history taking. Follows all commands speech and language fluent Cranial nerve II-XII: Pupils were equal round reactive to light. Extraocular movements were full, visual field were full on confrontational test. Facial sensation and strength were normal. Uvula tongue midline. Head turning and shoulder shrug  were normal and symmetric. Motor: The motor testing reveals 5 over 5 strength of all 4 extremities. Good symmetric motor tone is noted throughout.  Coordination: Cerebellar testing reveals good finger-nose-finger and heel-to-shin bilaterally.  Gait and station: Gait is normal. Tandem gait is normal. Romberg is negative. No drift is seen.   DIAGNOSTIC DATA (LABS, IMAGING, TESTING) - I reviewed patient records, labs, notes, testing and imaging myself where available.  MMSE - Mini Mental State Exam 07/28/2016  Not completed: (No Data)     Lab Results  Component Value Date   WBC 7.1 05/22/2018   HGB 12.0 05/22/2018   HCT 36.6 05/22/2018   MCV 93.9 05/22/2018   PLT 262.0 05/22/2018      Component Value Date/Time   NA 144 05/22/2018 1206   K 3.9 05/22/2018 1206   CL 112 05/22/2018 1206   CO2 24 05/22/2018 1206   GLUCOSE 93 05/22/2018 1206   BUN 23 05/22/2018 1206   CREATININE 1.28 (H) 05/22/2018 1206   CALCIUM 10.5 05/22/2018 1206   PROT 7.1 05/22/2018 1206   ALBUMIN 3.8 05/22/2018 1206   AST 18 05/22/2018 1206   ALT 12 05/22/2018 1206   ALKPHOS 90 05/22/2018 1206   BILITOT 0.3 05/22/2018 1206   GFRNONAA 53 (L) 07/10/2012 1120   GFRAA 61 (L) 07/10/2012 1120   Lab Results  Component Value Date   CHOL 150 05/22/2018   HDL 68.50 05/22/2018   LDLCALC 61 05/22/2018   LDLDIRECT 143.2 12/19/2007   TRIG 103.0 05/22/2018   CHOLHDL 2 05/22/2018   Lab Results  Component Value Date   HGBA1C 5.5 11/21/2018   No results found for: VITAMINB12 Lab Results  Component Value Date   TSH 1.87 01/26/2018     ASSESSMENT AND PLAN 78 y.o. year old female  has a past medical history of ALLERGIC RHINITIS, CHRONIC, ANEMIA, Arthritis, BCC (basal cell carcinoma), lip (04/2015), Chronic sinusitis, CKD (chronic kidney disease) (2018), COPD (chronic obstructive pulmonary disease) ([place]) (dx 2013), Episodic tension type headache, EXOGENOUS OBESITY, GERD, HYPERLIPIDEMIA, Hypertension, Migraines (07/10/2012), OSTEOPENIA, and Pneumonia (2018-2019). here with     ICD-10-CM   1. Other migraine without status migrainosus, not intractable  G43.809     Joy Martin does report a decrease in severity and frequency of migraines.  She is tolerating Aimovig and topiramate well without adverse effects.  We will continue current therapy.  We have discussed the need to limit Excedrin usage.  She states that this is used for other pain outside of her migraines.  I have discussed potential side effects of rebound headaches as well as gastric concerns.  She agrees to continue cutting back her usage of Excedrin.  I have asked that she take this medication with food.  She was advised to follow-up closely with her PCP as well. We will follow-up with Joy Martin in 6 months, sooner if needed.  She verbalizes  understanding and agreement with this plan.   No orders of the defined types were placed in this encounter.    No orders of the defined types were placed in this encounter.     I spent 15 minutes with the patient. 50% of this time was spent counseling and educating patient on plan of care and medications.    Debbora Presto, FNP-C 05/17/2019, 9:33 PM [facility] [address] [place], [place] [address] [phone]

## 2019-05-16 NOTE — Patient Instructions (Addendum)
Continue topiramate and Amovig as prescribed  Continue to wean Excedrin (this medication can cause rebound headaches)  When you take Excedrin please take with food  Follow up in 6 months   Migraine Headache A migraine headache is an intense, throbbing pain on one side or both sides of the head. Migraine headaches may also cause other symptoms, such as nausea, vomiting, and sensitivity to light and noise. A migraine headache can last from 4 hours to 3 days. Talk with your doctor about what things may bring on (trigger) your migraine headaches. What are the causes? The exact cause of this condition is not known. However, a migraine may be caused when nerves in the brain become irritated and release chemicals that cause inflammation of blood vessels. This inflammation causes pain. This condition may be triggered or caused by:  Drinking alcohol.  Smoking.  Taking medicines, such as: ? Medicine used to treat chest pain (nitroglycerin). ? Birth control pills. ? Estrogen. ? Certain blood pressure medicines.  Eating or drinking products that contain nitrates, glutamate, aspartame, or tyramine. Aged cheeses, chocolate, or caffeine may also be triggers.  Doing physical activity. Other things that may trigger a migraine headache include:  Menstruation.  Pregnancy.  Hunger.  Stress.  Lack of sleep or too much sleep.  Weather changes.  Fatigue. What increases the risk? The following factors may make you more likely to experience migraine headaches:  Being a certain age. This condition is more common in people who are 61-47 years old.  Being female.  Having a family history of migraine headaches.  Being Caucasian.  Having a mental health condition, such as depression or anxiety.  Being obese. What are the signs or symptoms? The main symptom of this condition is pulsating or throbbing pain. This pain may:  Happen in any area of the head, such as on one side or both sides.   Interfere with daily activities.  Get worse with physical activity.  Get worse with exposure to bright lights or loud noises. Other symptoms may include:  Nausea.  Vomiting.  Dizziness.  General sensitivity to bright lights, loud noises, or smells. Before you get a migraine headache, you may get warning signs (an aura). An aura may include:  Seeing flashing lights or having blind spots.  Seeing bright spots, halos, or zigzag lines.  Having tunnel vision or blurred vision.  Having numbness or a tingling feeling.  Having trouble talking.  Having muscle weakness. Some people have symptoms after a migraine headache (postdromal phase), such as:  Feeling tired.  Difficulty concentrating. How is this diagnosed? A migraine headache can be diagnosed based on:  Your symptoms.  A physical exam.  Tests, such as: ? CT scan or an MRI of the head. These imaging tests can help rule out other causes of headaches. ? Taking fluid from the spine (lumbar puncture) and analyzing it (cerebrospinal fluid analysis, or CSF analysis). How is this treated? This condition may be treated with medicines that:  Relieve pain.  Relieve nausea.  Prevent migraine headaches. Treatment for this condition may also include:  Acupuncture.  Lifestyle changes like avoiding foods that trigger migraine headaches.  Biofeedback.  Cognitive behavioral therapy. Follow these instructions at home: Medicines  Take over-the-counter and prescription medicines only as told by your health care provider.  Ask your health care provider if the medicine prescribed to you: ? Requires you to avoid driving or using heavy machinery. ? Can cause constipation. You may need to take these actions to  prevent or treat constipation:  Drink enough fluid to keep your urine pale yellow.  Take over-the-counter or prescription medicines.  Eat foods that are high in fiber, such as beans, whole grains, and fresh fruits  and vegetables.  Limit foods that are high in fat and processed sugars, such as fried or sweet foods. Lifestyle  Do not drink alcohol.  Do not use any products that contain nicotine or tobacco, such as cigarettes, e-cigarettes, and chewing tobacco. If you need help quitting, ask your health care provider.  Get at least 8 hours of sleep every night.  Find ways to manage stress, such as meditation, deep breathing, or yoga. General instructions      Keep a journal to find out what may trigger your migraine headaches. For example, write down: ? What you eat and drink. ? How much sleep you get. ? Any change to your diet or medicines.  If you have a migraine headache: ? Avoid things that make your symptoms worse, such as bright lights. ? It may help to lie down in a dark, quiet room. ? Do not drive or use heavy machinery. ? Ask your health care provider what activities are safe for you while you are experiencing symptoms.  Keep all follow-up visits as told by your health care provider. This is important. Contact a health care provider if:  You develop symptoms that are different or more severe than your usual migraine headache symptoms.  You have more than 15 headache days in one month. Get help right away if:  Your migraine headache becomes severe.  Your migraine headache lasts longer than 72 hours.  You have a fever.  You have a stiff neck.  You have vision loss.  Your muscles feel weak or like you cannot control them.  You start to lose your balance often.  You have trouble walking.  You faint.  You have a seizure. Summary  A migraine headache is an intense, throbbing pain on one side or both sides of the head. Migraines may also cause other symptoms, such as nausea, vomiting, and sensitivity to light and noise.  This condition may be treated with medicines and lifestyle changes. You may also need to avoid certain things that trigger a migraine headache.   Keep a journal to find out what may trigger your migraine headaches.  Contact your health care provider if you have more than 15 headache days in a month or you develop symptoms that are different or more severe than your usual migraine headache symptoms. This information is not intended to replace advice given to you by your health care provider. Make sure you discuss any questions you have with your health care provider. Document Released: 10/25/2005 Document Revised: 02/16/2019 Document Reviewed: 12/07/2018 Elsevier Patient Education  2020 [employer].

## 2019-05-17 ENCOUNTER — Encounter: Payer: Self-pay | Admitting: Family Medicine

## 2019-05-21 NOTE — Patient Instructions (Addendum)
  Tests ordered today. Your results will be released to MyChart (or called to you) after review.  If any changes need to be made, you will be notified at that same time.    Medications reviewed and updated.  Changes include :   none     Please followup in 6 months   

## 2019-05-21 NOTE — Progress Notes (Signed)
Subjective:    Patient ID: Joy Martin, female    DOB: February 10, 1941, 78 y.o.   MRN: [id number]  HPI The patient is here for follow up.  She is not exercising regularly.    Hypertension: She is taking her medication daily. She is compliant with a low sodium diet.  She denies chest pain, palpitations, edema, and regular headaches. She does not monitor her blood pressure at home.    Prediabetes:  She is somewhat compliant with a low sugar/carbohydrate diet.  She is not exercising regularly.  Hyperlipidemia: She is taking her medication daily. She is compliant with a low fat/cholesterol diet. She denies myalgias.   CKD:  She follows with nephrology.  She last saw nephrology last November.  GERD:  She is taking her medication daily as prescribed.  She denies any GERD symptoms and feels her GERD is well controlled.   Cough:  She continues to have a chronic cough.  She has seen pulmonary in the past.  She is using the Flonase.  She denies GERD and is taking her medication as prescribed.    Medications and allergies reviewed with patient and updated if appropriate.  Patient Active Problem List   Diagnosis Date Noted  . Loose stools 11/21/2018  . Hyperparathyroidism, primary ([place]) 10/29/2018  . Acute cystitis without hematuria 09/05/2018  . Community acquired pneumonia 02/21/2018  . Neuralgia 01/16/2018  . Neck pain 01/16/2018  . Prediabetes 07/27/2017  . Cough 07/27/2017  . Kyphosis 05/04/2017  . Chronic shoulder bursitis, left 02/15/2017  . 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 Migraine headache 01/26/2017  . Back pain 01/26/2017  . Chronic kidney disease 01/26/2017  . Hypertensive retinopathy 11/29/2016  . Osteoarthritis of right knee 07/28/2016  . Basal cell carcinoma of skin 11/26/2015  . Abnormal CXR 01/27/2013  . Obesity 12/25/2009  . Essential hypertension, benign 12/24/2008  . ALLERGIC RHINITIS, CHRONIC 12/24/2008  . Hyperlipidemia 12/19/2007  . ANEMIA  12/19/2007  . GERD 12/19/2007  . Osteopenia 12/19/2007    Current Outpatient Medications on File Prior to Visit  Medication Sig Dispense Refill  . amLODipine (NORVASC) 5 MG tablet TAKE 1 TABLET BY MOUTH EVERY DAY 90 tablet 3  . aspirin-acetaminophen-caffeine (EXCEDRIN MIGRAINE) 440-102-72 MG tablet Take by mouth every 6 (six) hours as needed for headache.    . cholecalciferol (VITAMIN D) 1000 units tablet Take 1,000 Units by mouth daily.    . ciclopirox (LOPROX) 0.77 % cream Apply 1 application topically 2 (two) times daily as needed. rash    . cycloSPORINE (RESTASIS) 0.05 % ophthalmic emulsion 1 drop 2 (two) times daily.      Marland Kitchen denosumab (PROLIA) 60 MG/ML SOSY injection Inject 60 mg into the skin every 6 (six) months.    Eduard Roux (AIMOVIG) 70 MG/ML SOAJ Inject 70 mg into the skin every 30 (thirty) days. 3 pen 3  . esomeprazole (NEXIUM) 40 MG capsule Take 1 capsule (40 mg total) by mouth 2 (two) times daily before a meal. 180 capsule 1  . famotidine (PEPCID) 40 MG tablet TAKE 1 TABLET BY MOUTH EVERY DAY 90 tablet 1  . Flaxseed, Linseed, (FLAXSEED OIL PO) Take 2 tablets by mouth daily.    . fluticasone (FLONASE) 50 MCG/ACT nasal spray Place 1 spray into both nostrils daily. 48 g 1  . nadolol (CORGARD) 40 MG tablet TAKE 1 TABLET BY MOUTH TWICE A DAY 180 tablet 1  . simvastatin (ZOCOR) 40 MG tablet TAKE 1 TABLET BY MOUTH EVERYDAY AT BEDTIME  90 tablet 0  . topiramate (TOPAMAX) 50 MG tablet Take 1 tablet (50 mg total) by mouth 2 (two) times daily. 180 tablet 4  . valsartan (DIOVAN) 320 MG tablet TAKE 1 TABLET BY MOUTH EVERY DAY 90 tablet 1  . [DISCONTINUED] Calcium Carbonate (CALCIUM 500 PO) Take by mouth.       No current facility-administered medications on file prior to visit.     Past Medical History:  Diagnosis Date  . ALLERGIC RHINITIS, CHRONIC   . ANEMIA   . Arthritis    "knees mainly; thumbs"  . BCC (basal cell carcinoma), lip 04/2015   removed right upper lip/perinostril  [name] ([facility])  . Chronic sinusitis    follows with ENT for same  . CKD (chronic kidney disease) 2018  . COPD (chronic obstructive pulmonary disease) ([place]) dx 2013   GOLD 1, follows with pulm for same  . Episodic tension type headache   . EXOGENOUS OBESITY   . GERD   . HYPERLIPIDEMIA   . Hypertension   . Migraines 07/10/2012   "over; last one was @ age 40"  . OSTEOPENIA   . Pneumonia 2018-2019   x 2    Past Surgical History:  Procedure Laterality Date  . APPENDECTOMY  07/10/2012  . BREAST CYST ASPIRATION  ?1980's   right  . EYE SURGERY     both eyes  . KNEE ARTHROSCOPY  1970's or 1980's   right; torn cartilage  . LAPAROSCOPIC APPENDECTOMY  07/10/2012   Procedure: APPENDECTOMY LAPAROSCOPIC;  Surgeon: Harl Bowie, MD;  Location: [facility];  Service: General;  Laterality: N/A;  . MOHS SURGERY  01/2016   nose  . MOLE REMOVAL  1957   "my back"  . SKIN CANCER EXCISION     "2 on my back; 2 on my face"  . TONSILLECTOMY AND ADENOIDECTOMY  1946  . [place] EXTRACTION  1959    Social History   Socioeconomic History  . Marital status: Single    Spouse name: Not on file  . Number of children: 0  . Years of education: 3  . Highest education level: Not on file  Occupational History    Comment: retired Pharmacist, hospital, 5th grade  Social Needs  . Financial resource strain: Not on file  . Food insecurity    Worry: Not on file    Inability: Not on file  . Transportation needs    Medical: Not on file    Non-medical: Not on file  Tobacco Use  . Smoking status: Former Smoker    Packs/day: 0.75    Years: 20.00    Pack years: 15.00    Types: Cigarettes    Quit date: 07/25/1982    Years since quitting: 36.8  . Smokeless tobacco: Never Used  . Tobacco comment: 07/10/2012 "stopped smoking cigarettes 1980's"  Substance and Sexual Activity  . Alcohol use: Yes    Comment: 07/10/2012 "glass of wine 1-2X/yr"  . Drug use: No  . Sexual activity: Never  Lifestyle  . Physical activity     Days per week: Not on file    Minutes per session: Not on file  . Stress: Not on file  Relationships  . Social Herbalist on phone: Not on file    Gets together: Not on file    Attends religious service: Not on file    Active member of club or organization: Not on file    Attends meetings of clubs or organizations: Not on  file    Relationship status: Not on file  Other Topics Concern  . 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 Prostate cancer Father   . Cancer Father        prostate  . Cancer Mother        walden[place]uns microgobular anemia  . Hyperparathyroidism Mother   . Alcohol abuse Other   . Heart disease Other   . Hypertension Other   . Diabetes Other   . Pancreatic cancer Other   . Hyperparathyroidism Sister   . Cancer Maternal Grandfather        pancreatic    Review of Systems  Constitutional: Negative for chills and fever.  HENT: Positive for voice change (occ). Negative for sore throat and trouble swallowing.   Respiratory: Positive for cough (chronic) and shortness of breath (chronic). Negative for wheezing.   Cardiovascular: Negative for chest pain (msk pain from cough), palpitations and leg swelling.  Gastrointestinal:       No gerd  Genitourinary: Positive for menstrual problem.  Neurological: Positive for headaches (migraines controlled). Negative for dizziness and light-headedness.       Objective:   Vitals:   05/22/19 1335  BP: (!) 158/78  Pulse: (!) 58  Resp: 16  Temp: 98 F (36.7 C)  SpO2: 97%   BP Readings from Last 3 Encounters:  05/22/19 (!) 158/78  05/16/19 135/76  11/21/18 (!) 158/82   Wt Readings from Last 3 Encounters:  05/22/19 133 lb (60.3 kg)  05/16/19 133 lb (60.3 kg)  11/21/18 140 lb (63.5 kg)   Body mass index is 25.13 kg/m.   Physical Exam    Constitutional: Appears well-developed  and well-nourished. No distress.  HENT:  Head: Normocephalic and atraumatic.  Neck: Neck supple. No tracheal deviation present. No thyromegaly present.  No cervical lymphadenopathy Cardiovascular: Normal rate, regular rhythm and normal heart sounds.   No murmur heard. No carotid bruit .  No edema Pulmonary/Chest: Effort normal and breath sounds normal. No respiratory distress. No has no wheezes. No rales.  Skin: Skin is warm and dry. Not diaphoretic.  Psychiatric: Normal mood and affect. Behavior is normal.      Assessment & Plan:    See Problem List for Assessment and Plan of chronic medical problems.

## 2019-05-22 ENCOUNTER — Ambulatory Visit (INDEPENDENT_AMBULATORY_CARE_PROVIDER_SITE_OTHER): Payer: Medicare Other | Admitting: Internal Medicine

## 2019-05-22 ENCOUNTER — Other Ambulatory Visit (INDEPENDENT_AMBULATORY_CARE_PROVIDER_SITE_OTHER): Payer: Medicare Other

## 2019-05-22 ENCOUNTER — Encounter: Payer: Self-pay | Admitting: Internal Medicine

## 2019-05-22 ENCOUNTER — Other Ambulatory Visit: Payer: Self-pay

## 2019-05-22 VITALS — BP 158/78 | HR 58 | Temp 98.0°F | Resp 16 | Ht 61.0 in | Wt 133.0 lb

## 2019-05-22 DIAGNOSIS — I10 Essential (primary) hypertension: Secondary | ICD-10-CM | POA: Diagnosis not present

## 2019-05-22 DIAGNOSIS — E7849 Other hyperlipidemia: Secondary | ICD-10-CM | POA: Diagnosis not present

## 2019-05-22 DIAGNOSIS — N189 Chronic kidney disease, unspecified: Secondary | ICD-10-CM | POA: Diagnosis not present

## 2019-05-22 DIAGNOSIS — E210 Primary hyperparathyroidism: Secondary | ICD-10-CM

## 2019-05-22 DIAGNOSIS — G43809 Other migraine, not intractable, without status migrainosus: Secondary | ICD-10-CM

## 2019-05-22 DIAGNOSIS — R7303 Prediabetes: Secondary | ICD-10-CM

## 2019-05-22 DIAGNOSIS — K219 Gastro-esophageal reflux disease without esophagitis: Secondary | ICD-10-CM

## 2019-05-22 DIAGNOSIS — R05 Cough: Secondary | ICD-10-CM

## 2019-05-22 DIAGNOSIS — R059 Cough, unspecified: Secondary | ICD-10-CM

## 2019-05-22 LAB — COMPREHENSIVE METABOLIC PANEL
ALT: 10 U/L (ref 0–35)
AST: 16 U/L (ref 0–37)
Albumin: 4 g/dL (ref 3.5–5.2)
Alkaline Phosphatase: 59 U/L (ref 39–117)
BUN: 19 mg/dL (ref 6–23)
CO2: 26 mEq/L (ref 19–32)
Calcium: 10.1 mg/dL (ref 8.4–10.5)
Chloride: 110 mEq/L (ref 96–112)
Creatinine, Ser: 0.97 mg/dL (ref 0.40–1.20)
GFR: 55.22 mL/min — ABNORMAL LOW (ref >60.00)
Glucose, Bld: 92 mg/dL (ref 70–99)
Potassium: 4.1 mEq/L (ref 3.5–5.1)
Sodium: 142 mEq/L (ref 135–145)
Total Bilirubin: 0.3 mg/dL (ref 0.2–1.2)
Total Protein: 7.4 g/dL (ref 6.0–8.3)

## 2019-05-22 LAB — HEMOGLOBIN A1C: Hgb A1c MFr Bld: 6.3 % (ref 4.6–6.5)

## 2019-05-22 LAB — CBC WITH DIFFERENTIAL/PLATELET
Basophils Absolute: 0.1 10*3/uL (ref 0.0–0.1)
Basophils Relative: 1 % (ref 0.0–3.0)
Eosinophils Absolute: 0.3 10*3/uL (ref 0.0–0.7)
Eosinophils Relative: 5.4 % — ABNORMAL HIGH (ref 0.0–5.0)
HCT: 37.5 % (ref 36.0–46.0)
Hemoglobin: 11.9 g/dL — ABNORMAL LOW (ref 12.0–15.0)
Lymphocytes Relative: 21 % (ref 12.0–46.0)
Lymphs Abs: 1.3 10*3/uL (ref 0.7–4.0)
MCHC: 31.7 g/dL (ref 30.0–36.0)
MCV: 90.7 fl (ref 78.0–100.0)
Monocytes Absolute: 0.5 10*3/uL (ref 0.1–1.0)
Monocytes Relative: 7.1 % (ref 3.0–12.0)
Neutro Abs: 4.2 10*3/uL (ref 1.4–7.7)
Neutrophils Relative %: 65.5 % (ref 43.0–77.0)
Platelets: 232 10*3/uL (ref 150.0–400.0)
RBC: 4.14 Mil/uL (ref 3.87–5.11)
RDW: 15.2 % (ref 11.5–15.5)
WBC: 6.4 10*3/uL (ref 4.0–10.5)

## 2019-05-22 LAB — LIPID PANEL
Cholesterol: 148 mg/dL (ref 0–200)
HDL: 66.6 mg/dL (ref >39.00)
LDL Cholesterol: 57 mg/dL (ref 0–99)
NonHDL: 81.54
Total CHOL/HDL Ratio: 2
Triglycerides: 123 mg/dL (ref 0.0–149.0)
VLDL: 24.6 mg/dL (ref 0.0–40.0)

## 2019-05-22 LAB — VITAMIN D 25 HYDROXY (VIT D DEFICIENCY, FRACTURES): VITD: 42.82 ng/mL (ref 30.00–100.00)

## 2019-05-22 LAB — TSH: TSH: 2.82 u[IU]/mL (ref 0.35–4.50)

## 2019-05-22 NOTE — Assessment & Plan Note (Signed)
GERD controlled Continue daily medication  

## 2019-05-22 NOTE — Assessment & Plan Note (Signed)
Management per Dr. Loanne Drilling TSH, PTH, calcium and vitamin D to be checked for Dr. Loanne Drilling

## 2019-05-22 NOTE — Assessment & Plan Note (Signed)
Continue statin Check CMP, lipid panel

## 2019-05-22 NOTE — Assessment & Plan Note (Signed)
Following with nephrology CMP 

## 2019-05-22 NOTE — Progress Notes (Signed)
I reviewed note and agree with plan.   Penni Bombard, MD 02/24/6221, 97:98 AM Certified in Neurology, Neurophysiology and Neuroimaging  [facility] [address], [place], [place] [address] [phone]

## 2019-05-22 NOTE — Assessment & Plan Note (Signed)
Following with neurology Migraines currently well controlled

## 2019-05-22 NOTE — Assessment & Plan Note (Signed)
Check a1c Low sugar / carb diet Stressed regular exercise   

## 2019-05-22 NOTE — Assessment & Plan Note (Addendum)
Has white coat htn-it is always elevated here Recommended checking blood pressure at home Continue current medications CMP

## 2019-05-22 NOTE — Assessment & Plan Note (Signed)
Chronic Has seen pulmonary-years ago At this point is taking Pepcid, Nexium-GERD controlled Using Flonase Does not want to see pulmonary at this time, but may be in the future

## 2019-05-23 LAB — PTH, INTACT AND CALCIUM
Calcium: 10.3 mg/dL (ref 8.6–10.4)
PTH: 119 pg/mL — ABNORMAL HIGH (ref 14–64)

## 2019-05-24 ENCOUNTER — Encounter: Payer: Self-pay | Admitting: Internal Medicine

## 2019-05-31 ENCOUNTER — Other Ambulatory Visit: Payer: Self-pay | Admitting: Diagnostic Neuroimaging

## 2019-07-02 ENCOUNTER — Other Ambulatory Visit: Payer: Self-pay | Admitting: Internal Medicine

## 2019-07-06 ENCOUNTER — Other Ambulatory Visit: Payer: Self-pay | Admitting: Internal Medicine

## 2019-07-06 ENCOUNTER — Other Ambulatory Visit: Payer: Self-pay | Admitting: Diagnostic Neuroimaging

## 2019-08-05 ENCOUNTER — Other Ambulatory Visit: Payer: Self-pay | Admitting: Internal Medicine

## 2019-09-03 ENCOUNTER — Telehealth: Payer: Self-pay | Admitting: Internal Medicine

## 2019-09-03 NOTE — Telephone Encounter (Signed)
Patient has been submitted for insurance approval for Prolia injection through the Prolia Portal.  °Waiting on benefits. °Will contact patient once these have been received. °

## 2019-10-11 ENCOUNTER — Ambulatory Visit (INDEPENDENT_AMBULATORY_CARE_PROVIDER_SITE_OTHER): Payer: Medicare Other

## 2019-10-11 ENCOUNTER — Other Ambulatory Visit: Payer: Self-pay

## 2019-10-11 DIAGNOSIS — M810 Age-related osteoporosis without current pathological fracture: Secondary | ICD-10-CM

## 2019-10-11 MED ORDER — DENOSUMAB 60 MG/ML ~~LOC~~ SOSY
60.0000 mg | PREFILLED_SYRINGE | Freq: Once | SUBCUTANEOUS | Status: AC
  Administered 2019-10-11: 60 mg via SUBCUTANEOUS

## 2019-10-11 NOTE — Progress Notes (Signed)
prolia Injection given.   Toi Stelly J Lanora Reveron, MD  

## 2019-10-29 ENCOUNTER — Telehealth: Payer: Self-pay

## 2019-10-29 NOTE — Telephone Encounter (Signed)
Received a instant approval for Aimovig. Approved through 11-07-2020. A copy of the approval letter has been faxed to the patient's pharmacy. Confirmation fax has been received.

## 2019-10-29 NOTE — Telephone Encounter (Signed)
Pending renewal for Aimovig 70 mg Key: [id number]  I will update once a decision has been made.

## 2019-11-19 ENCOUNTER — Encounter: Payer: Self-pay | Admitting: Family Medicine

## 2019-11-19 ENCOUNTER — Other Ambulatory Visit: Payer: Self-pay

## 2019-11-19 ENCOUNTER — Ambulatory Visit: Payer: Medicare PPO | Admitting: Family Medicine

## 2019-11-19 VITALS — BP 115/69 | HR 60 | Temp 97.0°F | Ht 61.0 in | Wt 131.0 lb

## 2019-11-19 DIAGNOSIS — G43809 Other migraine, not intractable, without status migrainosus: Secondary | ICD-10-CM

## 2019-11-19 NOTE — Patient Instructions (Addendum)
Continue Amovig 70mg  every month and topiramate 50mg  twice daily  Keep trying to wean Excedrin. Always take with food. Talk with PCP to see if another medication may be better for joint pain.   Drink at least 50 ounces of water. Well balanced diet and regular exercise.   Follow up in 1 year, sooner if needed     Analgesic Rebound Headache An analgesic rebound headache, sometimes called a medication overuse headache, is a headache that comes after pain medicine (analgesic) taken to treat the original (primary) headache has worn off. Any type of primary headache can return as a rebound headache if a person regularly takes analgesics more than three times a week to treat it. The types of primary headaches that are commonly associated with rebound headaches include:  Migraines.  Headaches that arise from tense muscles in the head and neck area (tension headaches).  Headaches that develop and happen again (recur) on one side of the head and around the eye (cluster headaches). If rebound headaches continue, they become chronic daily headaches. What are the causes? This condition may be caused by frequent use of:  Over-the-counter medicines such as aspirin, ibuprofen, and acetaminophen.  Sinus relief medicines and other medicines that contain caffeine.  Narcotic pain medicines such as codeine and oxycodone. What are the signs or symptoms? The symptoms of a rebound headache are the same as the symptoms of the original headache. Some of the symptoms of specific types of headaches include: Migraine headache  Pulsing or throbbing pain on one or both sides of the head.  Severe pain that interferes with daily activities.  Pain that is worsened by physical activity.  Nausea, vomiting, or both.  Pain with exposure to bright light, loud noises, or strong smells.  General sensitivity to bright light, loud noises, or strong smells.  Visual changes.  Numbness of one or both arms. Tension  headache  Pressure around the head.  Dull, aching head pain.  Pain felt over the front and sides of the head.  Tenderness in the muscles of the head, neck, and shoulders. Cluster headache  Severe pain that begins in or around one eye or temple.  Redness and tearing in the eye on the same side as the pain.  Droopy or swollen eyelid.  One-sided head pain.  Nausea.  Runny nose.  Sweaty, pale facial skin.  Restlessness. How is this diagnosed? This condition is diagnosed by:  Reviewing your medical history. This includes the nature of your primary headaches.  Reviewing the types of pain medicines that you have been using to treat your headaches and how often you take them. How is this treated? This condition may be treated or managed by:  Discontinuing frequent use of the analgesic medicine. Doing this may worsen your headaches at first, but the pain should eventually become more manageable, less frequent, and less severe.  Seeing a headache specialist. He or she may be able to help you manage your headaches and help make sure there is not another cause of the headaches.  Using methods of stress relief, such as acupuncture, counseling, biofeedback, and massage. Talk with your health care provider about which methods might be good for you. Follow these instructions at home:  Take over-the-counter and prescription medicines only as told by your health care provider.  Stop the repeated use of pain medicine as told by your health care provider. Stopping can be difficult. Carefully follow instructions from your health care provider.  Avoid triggers that are known to  cause your primary headaches.  Keep all follow-up visits as told by your health care provider. This is important. Contact a health care provider if:  You continue to experience headaches after following treatments that your health care provider recommended. Get help right away if:  You develop new headache  pain.  You develop headache pain that is different than what you have experienced in the past.  You develop numbness or tingling in your arms or legs.  You develop changes in your speech or vision. This information is not intended to replace advice given to you by your health care provider. Make sure you discuss any questions you have with your health care provider. Document Revised: 10/07/2017 Document Reviewed: 03/29/2016 Elsevier Patient Education  [place].   Migraine Headache A migraine headache is a very strong throbbing pain on one side or both sides of your head. This type of headache can also cause other symptoms. It can last from 4 hours to 3 days. Talk with your doctor about what things may bring on (trigger) this condition. What are the causes? The exact cause of this condition is not known. This condition may be triggered or caused by:  Drinking alcohol.  Smoking.  Taking medicines, such as: ? Medicine used to treat chest pain (nitroglycerin). ? Birth control pills. ? Estrogen. ? Some blood pressure medicines.  Eating or drinking certain products.  Doing physical activity. Other things that may trigger a migraine headache include:  Having a menstrual period.  Pregnancy.  Hunger.  Stress.  Not getting enough sleep or getting too much sleep.  Weather changes.  Tiredness (fatigue). What increases the risk?  Being 45-20 years old.  Being female.  Having a family history of migraine headaches.  Being Caucasian.  Having depression or anxiety.  Being very overweight. What are the signs or symptoms?  A throbbing pain. This pain may: ? Happen in any area of the head, such as on one side or both sides. ? Make it hard to do daily activities. ? Get worse with physical activity. ? Get worse around bright lights or loud noises.  Other symptoms may include: ? Feeling sick to your stomach (nauseous). ? Vomiting. ? Dizziness. ? Being  sensitive to bright lights, loud noises, or smells.  Before you get a migraine headache, you may get warning signs (an aura). An aura may include: ? Seeing flashing lights or having blind spots. ? Seeing bright spots, halos, or zigzag lines. ? Having tunnel vision or blurred vision. ? Having numbness or a tingling feeling. ? Having trouble talking. ? Having weak muscles.  Some people have symptoms after a migraine headache (postdromal phase), such as: ? Tiredness. ? Trouble thinking (concentrating). How is this treated?  Taking medicines that: ? Relieve pain. ? Relieve the feeling of being sick to your stomach. ? Prevent migraine headaches.  Treatment may also include: ? Having acupuncture. ? Avoiding foods that bring on migraine headaches. ? Learning ways to control your body functions (biofeedback). ? Therapy to help you know and deal with negative thoughts (cognitive behavioral therapy). Follow these instructions at home: Medicines  Take over-the-counter and prescription medicines only as told by your doctor.  Ask your doctor if the medicine prescribed to you: ? Requires you to avoid driving or using heavy machinery. ? Can cause trouble pooping (constipation). You may need to take these steps to prevent or treat trouble pooping:  Drink enough fluid to keep your pee (urine) pale yellow.  Take over-the-counter  or prescription medicines.  Eat foods that are high in fiber. These include beans, whole grains, and fresh fruits and vegetables.  Limit foods that are high in fat and sugar. These include fried or sweet foods. Lifestyle  Do not drink alcohol.  Do not use any products that contain nicotine or tobacco, such as cigarettes, e-cigarettes, and chewing tobacco. If you need help quitting, ask your doctor.  Get at least 8 hours of sleep every night.  Limit and deal with stress. General instructions      Keep a journal to find out what may bring on your migraine  headaches. For example, write down: ? What you eat and drink. ? How much sleep you get. ? Any change in what you eat or drink. ? Any change in your medicines.  If you have a migraine headache: ? Avoid things that make your symptoms worse, such as bright lights. ? It may help to lie down in a dark, quiet room. ? Do not drive or use heavy machinery. ? Ask your doctor what activities are safe for you.  Keep all follow-up visits as told by your doctor. This is important. Contact a doctor if:  You get a migraine headache that is different or worse than others you have had.  You have more than 15 headache days in one month. Get help right away if:  Your migraine headache gets very bad.  Your migraine headache lasts longer than 72 hours.  You have a fever.  You have a stiff neck.  You have trouble seeing.  Your muscles feel weak or like you cannot control them.  You start to lose your balance a lot.  You start to have trouble walking.  You pass out (faint).  You have a seizure. Summary  A migraine headache is a very strong throbbing pain on one side or both sides of your head. These headaches can also cause other symptoms.  This condition may be treated with medicines and changes to your lifestyle.  Keep a journal to find out what may bring on your migraine headaches.  Contact a doctor if you get a migraine headache that is different or worse than others you have had.  Contact your doctor if you have more than 15 headache days in a month. This information is not intended to replace advice given to you by your health care provider. Make sure you discuss any questions you have with your health care provider. Document Revised: 02/16/2019 Document Reviewed: 12/07/2018 Elsevier Patient Education  [place].

## 2019-11-19 NOTE — Progress Notes (Signed)
PATIENT: Joy Martin DOB: 08-06-1941  REASON FOR VISIT: follow up HISTORY FROM: patient  Chief Complaint  Patient presents with  . Follow-up    New RM, alone. No changes. No questions and no concerns.      HISTORY OF PRESENT ILLNESS: Today 11/19/19 Joy Martin is a 79 y.o. female here today for follow up for migraines. She continues Amovig 70mg  monthly and topiramate 50mg  twice daily. Headaches continue to improve. She has mild tightness of the top of her head 4-5 times a week. She has not had a migraine recently. She continues Excedrin regularly. She usually takes two tablets every day but tries to "skip days" when she can. She reports taking Excedrin for joint pain. She is followed closely by PCP and nephrology. BP and creatinine have been normal recently. She has follow up with both in the upcoming weeks. She is seeing ophthalmology this week. She is not interested in sleep study.   HISTORY: (copied from my note on 05/16/2019)  ROTASHA Martin is a 79 y.o. female here today for follow up of chronic migraines. She reports that her headaches have decreased in frequency. She is tolerating topiramate and Amovig well. She does continue daily use of Excedrin but has decreased the frequency. She is averaging two tablets daily. She feels that she has 5-6 headaches a week. She has not pursued sleep referral. She does not wish to have a sleep study at this time. She does not feel headaches are debilitating and feels fairly well. Left shoulder pain has improved.    HISTORY: (copied from [place] note on 11/15/2018)  Joy Martin a 79 year old female with a history of headaches. She returns today for follow-up. She is been taking AImoviglast 5 months. She does not notice any change in her headaches. She continues to have daily headaches. She states that she typically wakes up with a headache and then the headache will progress to either a true migraine whereas on the  right side of the head. She typically will take Excedrin Migraine. She reports that Maxalt has given her no benefit. She states sometimes her headaches will progress to neck pain and then into a migraine. She continues to take Excedrin Migraine daily. She states that she has tried to limit her dose but that is the only thing that helps with her headaches. She does state that she tends to fall asleep during the day. She does not plan to take naps but does fall asleep if she sits down. She does not have anyone that lives with her. She is unsure if she snores. She does state that she has to get up multiple times at night for urinary issues. She returns today for evaluation.  HISTORYUPDATE (04/18/18, VRP): Since last visit, doingabout the same. Toleratingmeds.Still with daily headaches (right parietal soreness and burned sensation, sometimes pounding) and daily Excedrin usage. Left shoulder pain improving.No alleviating or aggravating factors.  UPDATE 04/12/17: Since last visit, HA are improving. On TPX 50mg  twice a day. Continues on excedrin 2-4 tabs per day. Now with more left shoulder pain, radiating to neck and head. Seeing sports medicine clinic and trying home exercises.  UPDATE 12/07/16: Since last visit visit, continues with daily headaches. Has slightly reduced excedrin to 2-4 tabs per day. Tolerating TPX 25mg  daily.   PRIOR HPI (08/27/16): 79 year old right-handed female here for evaluation of headaches. Around age 32 years old patient had onset of right-sided headaches with right eye pain, throbbing, nausea, proceeded by visual  disturbance and aura. Patient was diagnosed with migraine headaches. During factors include menstrual cycle and red wine. She averaged 2-3 headaches per month each lasting 1-2 days. Patient was evaluated by the headache clinic in the past and tried nadolol, Imitrex, over-the-counter medications. By age 87 years old patient was going through menopause and her  migraine headaches subsided. Over many years patient has also had lower grade dull sore and intermittent stabbing headaches sometimes on the right side, sometimes on the top of her head. No other associated factors. These been going on for at least 10-20 years. Patient has gradually built up to using 2-6 tablets of Excedrin over-the-counter on a daily basis. This is been going on for many many years. Patient has tried weaning off in the past but this caused rebound headaches. Patient also was taking Fioricet 20 tablets per month for many years. Patient now establish with a new PCP who is reviewing medication list and advising patient to reduce medication overusage which may be triggering analgesic overuse headache and rebound headache. Patient referred to me for further evaluation consideration of prophylactic headache therapy.   REVIEW OF SYSTEMS: Out of a complete 14 system review of symptoms, the patient complains only of the following symptoms, headaches and all other reviewed systems are negative.  ALLERGIES: Allergies  Allergen Reactions  . Allegra [Fexofenadine Hcl] Other (See Comments)    Headache, "took the pill; I got flu symptoms"  . Prilosec [Omeprazole]     Fecal incontince  . Ranitidine     Fecal incontince    HOME MEDICATIONS: Outpatient Medications Prior to Visit  Medication Sig Dispense Refill  . AIMOVIG 70 MG/ML SOAJ INJECT 70MG  INTO THE SKIN EVERY 30 DAYS 3 pen 3  . amLODipine (NORVASC) 5 MG tablet TAKE 1 TABLET BY MOUTH EVERY DAY 90 tablet 3  . aspirin-acetaminophen-caffeine (EXCEDRIN MIGRAINE) [id number] MG tablet Take by mouth every 6 (six) hours as needed for headache.    . cholecalciferol (VITAMIN D) 1000 units tablet Take 1,000 Units by mouth daily.    . ciclopirox (LOPROX) 0.77 % cream Apply 1 application topically 2 (two) times daily as needed. rash    . cycloSPORINE (RESTASIS) 0.05 % ophthalmic emulsion 1 drop 2 (two) times daily.      Marland Kitchen denosumab (PROLIA) 60  MG/ML SOSY injection Inject 60 mg into the skin every 6 (six) months.    . esomeprazole (NEXIUM) 40 MG capsule Take 1 capsule (40 mg total) by mouth 2 (two) times daily before a meal. 180 capsule 1  . famotidine (PEPCID) 40 MG tablet TAKE 1 TABLET BY MOUTH EVERY DAY 90 tablet 1  . Flaxseed, Linseed, (FLAXSEED OIL PO) Take 2 tablets by mouth daily.    . fluticasone (FLONASE) 50 MCG/ACT nasal spray Place 1 spray into both nostrils daily. 48 g 1  . nadolol (CORGARD) 40 MG tablet TAKE 1 TABLET BY MOUTH TWICE A DAY 180 tablet 1  . simvastatin (ZOCOR) 40 MG tablet TAKE 1 TABLET BY MOUTH EVERYDAY AT BEDTIME 90 tablet 1  . topiramate (TOPAMAX) 50 MG tablet TAKE 1 TABLET BY MOUTH TWICE A DAY 180 tablet 4  . valsartan (DIOVAN) 320 MG tablet TAKE 1 TABLET BY MOUTH EVERY DAY 90 tablet 1  . Vitamin E 180 MG CAPS Take by mouth. 2x daily OTC     No facility-administered medications prior to visit.    PAST MEDICAL HISTORY: Past Medical History:  Diagnosis Date  . ALLERGIC RHINITIS, CHRONIC   . ANEMIA   .  Arthritis    "knees mainly; thumbs"  . 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 Chronic sinusitis    follows with ENT for same  . CKD (chronic kidney disease) 2018  . COPD (chronic obstructive pulmonary disease) ([place]) dx 2013   GOLD 1, follows with pulm for same  . Episodic tension type headache   . EXOGENOUS OBESITY   . GERD   . HYPERLIPIDEMIA   . Hypertension   . Migraines 07/10/2012   "over; last one was @ age 15"  . OSTEOPENIA   . Pneumonia 2018-2019   x 2    PAST SURGICAL HISTORY: Past Surgical History:  Procedure Laterality Date  . APPENDECTOMY  07/10/2012  . BREAST CYST ASPIRATION  ?1980's   right  . EYE SURGERY     both eyes  . KNEE ARTHROSCOPY  1970's or 1980's   right; torn cartilage  . LAPAROSCOPIC APPENDECTOMY  07/10/2012   Procedure: APPENDECTOMY LAPAROSCOPIC;  Surgeon: Harl Bowie, MD;  Location: [facility];  Service:  General;  Laterality: N/A;  . MOHS SURGERY  01/2016   nose  . MOLE REMOVAL  1957   "my back"  . SKIN CANCER EXCISION     "2 on my back; 2 on my face"  . TONSILLECTOMY AND ADENOIDECTOMY  1946  . WISDOM TOOTH EXTRACTION  1959    FAMILY HISTORY: Family History  Problem Relation Age of Onset  . Prostate cancer Father   . Cancer Father        prostate  . Cancer Mother        waldenstruns microgobular anemia  . Hyperparathyroidism Mother   . Alcohol abuse Other   . Heart disease Other   . Hypertension Other   . Diabetes Other   . Pancreatic cancer Other   . Hyperparathyroidism Sister   . Cancer Maternal Grandfather        pancreatic    SOCIAL HISTORY: Social History   Socioeconomic History  . Marital status: Single    Spouse name: Not on file  . Number of children: 0  . Years of education: 69  . Highest education level: Not on file  Occupational History    Comment: retired Pharmacist, hospital, 5th grade  Tobacco Use  . Smoking status: Former Smoker    Packs/day: 0.75    Years: 20.00    Pack years: 15.00    Types: Cigarettes    Quit date: 07/25/1982    Years since quitting: 37.3  . Smokeless tobacco: Never Used  . Tobacco comment: 07/10/2012 "stopped smoking cigarettes 1980's"  Substance and Sexual Activity  . Alcohol use: Yes    Comment: 07/10/2012 "glass of wine 1-2X/yr"  . Drug use: No  . Sexual activity: Never  Other Topics Concern  . Not on file  Social History Narrative   Retired Pharmacist, hospital, 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   Caffeine- coffee 1 cup, some Coke   Social Determinants of Health   Financial Resource Strain:   . Difficulty of Paying Living Expenses: Not on file  Food Insecurity:   . Worried About Charity fundraiser in the Last Year: Not on file  . Ran Out of Food in the Last Year: Not on file  Transportation Needs:   . Lack of Transportation (Medical): Not on file  . Lack of Transportation (Non-Medical): Not on file  Physical Activity:   . Days of Exercise per Week:  Not on file  .  Minutes of Exercise per Session: Not on file  Stress:   . Feeling of Stress : Not on file  Social Connections:   . Frequency of Communication with Friends and Family: Not on file  . Frequency of Social Gatherings with Friends and Family: Not on file  . Attends Religious Services: Not on file  . Active Member of Clubs or Organizations: Not on file  . Attends Archivist Meetings: Not on file  . Marital Status: Not on file  Intimate Partner Violence:   . Fear of Current or Ex-Partner: Not on file  . Emotionally Abused: Not on file  . Physically Abused: Not on file  . Sexually Abused: Not on file      PHYSICAL EXAM  Vitals:   11/19/19 1133  BP: 115/69  Pulse: 60  Temp: (!) 97 F (36.1 C)  Weight: 131 lb (59.4 kg)  Height: 5\' 1"  (1.549 m)   Body mass index is 24.75 kg/m.  Generalized: Well developed, in no acute distress  Cardiology: normal rate and rhythm, no murmur noted Respiratory: clear to auscultation bilaterally Neurological examination  Mentation: Alert oriented to time, place, history taking. Follows all commands speech and language fluent Cranial nerve II-XII: Pupils were equal round reactive to light. Extraocular movements were full, visual field were full on confrontational test. Facial sensation and strength were normal. Uvula tongue midline. Head turning and shoulder shrug  were normal and symmetric. Motor: The motor testing reveals 5 over 5 strength of all 4 extremities. Good symmetric motor tone is noted throughout.  Sensory: Sensory testing is intact to soft touch on all 4 extremities. No evidence of extinction is noted.  Coordination: Cerebellar testing reveals good finger-nose-finger and heel-to-shin bilaterally.  Gait and station: Gait is normal.   DIAGNOSTIC DATA (LABS, IMAGING, TESTING) - I reviewed patient records, labs, notes, testing and imaging myself where available.  MMSE - Mini Mental State Exam 07/28/2016  Not  completed: (No Data)     Lab Results  Component Value Date   WBC 6.4 05/22/2019   HGB 11.9 (L) 05/22/2019   HCT 37.5 05/22/2019   MCV 90.7 05/22/2019   PLT 232.0 05/22/2019      Component Value Date/Time   NA 142 05/22/2019 1414   K 4.1 05/22/2019 1414   CL 110 05/22/2019 1414   CO2 26 05/22/2019 1414   GLUCOSE 92 05/22/2019 1414   BUN 19 05/22/2019 1414   CREATININE 0.97 05/22/2019 1414   CALCIUM 10.3 05/22/2019 1414   CALCIUM 10.1 05/22/2019 1414   PROT 7.4 05/22/2019 1414   ALBUMIN 4.0 05/22/2019 1414   AST 16 05/22/2019 1414   ALT 10 05/22/2019 1414   ALKPHOS 59 05/22/2019 1414   BILITOT 0.3 05/22/2019 1414   GFRNONAA 53 (L) 07/10/2012 1120   GFRAA 61 (L) 07/10/2012 1120   Lab Results  Component Value Date   CHOL 148 05/22/2019   HDL 66.60 05/22/2019   LDLCALC 57 05/22/2019   LDLDIRECT 143.2 12/19/2007   TRIG 123.0 05/22/2019   CHOLHDL 2 05/22/2019   Lab Results  Component Value Date   HGBA1C 6.3 05/22/2019   No results found for: VITAMINB12 Lab Results  Component Value Date   TSH 2.82 05/22/2019     ASSESSMENT AND PLAN 79 y.o. year old female  has a past medical history of ALLERGIC RHINITIS, CHRONIC, ANEMIA, Arthritis, BCC (basal cell carcinoma), lip (04/2015), Chronic sinusitis, CKD (chronic kidney disease) (2018), COPD (chronic obstructive pulmonary disease) ([place]) (dx 2013), Episodic  tension type headache, EXOGENOUS OBESITY, GERD, HYPERLIPIDEMIA, Hypertension, Migraines (07/10/2012), OSTEOPENIA, and Pneumonia (2018-2019). here with     ICD-10-CM   1. Other migraine without status migrainosus, not intractable  G43.809     Bethena Roys is doing well. She feels that headaches are well managed at this time despite continued dull headaches occurring regularly. She is aware of potential rebound headaches and continues to wean Excedrin but reports that it helps with generalized aches and pains as well. I have advised she continue weaning Excedrin and talk with PCP  regarding generalized aches and pains. She will take Excedrin with food. She will continue Amovig and topiramate. She is not interested in increasing the doses of either medication at this time. She is not interested in sleep study. She will continue close follow up with PCP and nephrology. Last creatinine in 05/2019 was normal and BP is good today. Adequate hydration and well balanced diet advised. We will see her back in 1 year, sooner if needed. She verbalizes understanding and agreement with this plan.    No orders of the defined types were placed in this encounter.    No orders of the defined types were placed in this encounter.     I spent 15 minutes with the patient. 50% of this time was spent counseling and educating patient on plan of care and medications.    Debbora Presto, FNP-C 11/19/2019, 12:00 PM [facility] [address] [place], [place] [address] [phone]

## 2019-11-23 ENCOUNTER — Ambulatory Visit: Payer: Medicare Other | Admitting: Internal Medicine

## 2019-11-26 NOTE — Patient Instructions (Addendum)
  Blood work was ordered.     Medications reviewed and updated.  Changes include :   none  Your prescription(s) have been submitted to your pharmacy. Please take as directed and contact our office if you believe you are having problem(s) with the medication(s).   Please followup in 6 months   

## 2019-11-26 NOTE — Assessment & Plan Note (Addendum)
dexa up to date -- due 05/2020 prolia injections Q 6 months - last 10/2019 Taking vitamin d daily No exercise   Continue prolia dexa to be ordered at next visit

## 2019-11-26 NOTE — Progress Notes (Signed)
Subjective:    Patient ID: Joy Martin, female    DOB: [birth year], 79 y.o.   MRN: [id number]  HPI The patient is here for follow up of their chronic medical problems, including hypertensin, prediabetes, hyperlipidemia, CKD, GERD.  She is taking all of her medications as prescribed.    She is not exercising regularly.   She is not able to stand too long due to her back pain.    She is eating well.  She has no concerns.    Medications and allergies reviewed with patient and updated if appropriate.  Patient Active Problem List   Diagnosis Date Noted  . Loose stools 11/21/2018  . Hyperparathyroidism, primary ([place]) 10/29/2018  . Acute cystitis without hematuria 09/05/2018  . Community acquired pneumonia 02/21/2018  . Neuralgia 01/16/2018  . Neck pain 01/16/2018  . Prediabetes 07/27/2017  . Cough 07/27/2017  . Kyphosis 05/04/2017  . Chronic shoulder bursitis, left 02/15/2017  . 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 Migraine headache 01/26/2017  . Back pain 01/26/2017  . Chronic kidney disease 01/26/2017  . Hypertensive retinopathy 11/29/2016  . Osteoarthritis of right knee 07/28/2016  . Basal cell carcinoma of skin 11/26/2015  . Abnormal CXR 01/27/2013  . Obesity 12/25/2009  . Essential hypertension, benign 12/24/2008  . ALLERGIC RHINITIS, CHRONIC 12/24/2008  . Hyperlipidemia 12/19/2007  . ANEMIA 12/19/2007  . GERD 12/19/2007  . Osteopenia 12/19/2007    Current Outpatient Medications on File Prior to Visit  Medication Sig Dispense Refill  . AIMOVIG 70 MG/ML SOAJ INJECT 70MG  INTO THE SKIN EVERY 30 DAYS 3 pen 3  . amLODipine (NORVASC) 5 MG tablet TAKE 1 TABLET BY MOUTH EVERY DAY 90 tablet 3  . aspirin-acetaminophen-caffeine (EXCEDRIN MIGRAINE) [id number] MG tablet Take by mouth every 6 (six) hours as needed for headache.    . cholecalciferol (VITAMIN D) 1000 units tablet Take 1,000 Units by mouth 2 (two) times daily.    . ciclopirox (LOPROX) 0.77 % cream  Apply 1 application topically 2 (two) times daily as needed. rash    . cycloSPORINE (RESTASIS) 0.05 % ophthalmic emulsion 1 drop 2 (two) times daily.      Marland Kitchen denosumab (PROLIA) 60 MG/ML SOSY injection Inject 60 mg into the skin every 6 (six) months.    . famotidine (PEPCID) 40 MG tablet TAKE 1 TABLET BY MOUTH EVERY DAY 90 tablet 1  . Flaxseed, Linseed, (FLAXSEED OIL PO) Take 2 tablets by mouth daily.    . nadolol (CORGARD) 40 MG tablet TAKE 1 TABLET BY MOUTH TWICE A DAY 180 tablet 1  . simvastatin (ZOCOR) 40 MG tablet TAKE 1 TABLET BY MOUTH EVERYDAY AT BEDTIME 90 tablet 1  . topiramate (TOPAMAX) 50 MG tablet TAKE 1 TABLET BY MOUTH TWICE A DAY 180 tablet 4  . valsartan (DIOVAN) 320 MG tablet TAKE 1 TABLET BY MOUTH EVERY DAY 90 tablet 1  . Vitamin E 180 MG CAPS Take by mouth. 2x daily OTC    . [DISCONTINUED] Calcium Carbonate (CALCIUM 500 PO) Take by mouth.       No current facility-administered medications on file prior to visit.    Past Medical History:  Diagnosis Date  . ALLERGIC RHINITIS, CHRONIC   . ANEMIA   . Arthritis    "knees mainly; thumbs"  . 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 Chronic sinusitis    follows with ENT for same  . CKD (chronic kidney disease) 2018  .  COPD (chronic obstructive pulmonary disease) ([place]) dx 2013   GOLD 1, follows with pulm for same  . Episodic tension type headache   . EXOGENOUS OBESITY   . GERD   . HYPERLIPIDEMIA   . Hypertension   . Migraines 07/10/2012   "over; last one was @ age 43"  . OSTEOPENIA   . Pneumonia 2018-2019   x 2    Past Surgical History:  Procedure Laterality Date  . APPENDECTOMY  07/10/2012  . BREAST CYST ASPIRATION  ?1980's   right  . EYE SURGERY     both eyes  . KNEE ARTHROSCOPY  1970's or 1980's   right; torn cartilage  . LAPAROSCOPIC APPENDECTOMY  07/10/2012   Procedure: APPENDECTOMY LAPAROSCOPIC;  Surgeon: Harl Bowie, MD;  Location: [facility];  Service:  General;  Laterality: N/A;  . MOHS SURGERY  01/2016   nose  . MOLE REMOVAL  1957   "my back"  . SKIN CANCER EXCISION     "2 on my back; 2 on my face"  . TONSILLECTOMY AND ADENOIDECTOMY  1946  . [place] EXTRACTION  1959    Social History   Socioeconomic History  . Marital status: Single    Spouse name: Not on file  . Number of children: 0  . Years of education: 60  . Highest education level: Not on file  Occupational History    Comment: retired Pharmacist, hospital, 5th grade  Tobacco Use  . Smoking status: Former Smoker    Packs/day: 0.75    Years: 20.00    Pack years: 15.00    Types: Cigarettes    Quit date: 07/25/1982    Years since quitting: 37.3  . Smokeless tobacco: Never Used  . Tobacco comment: 07/10/2012 "stopped smoking cigarettes 1980's"  Substance and Sexual Activity  . Alcohol use: Yes    Comment: 07/10/2012 "glass of wine 1-2X/yr"  . Drug use: No  . Sexual activity: Never  Other Topics Concern  . Not on file  Social History Narrative   Retired Pharmacist, hospital, 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   Caffeine- coffee 1 cup, some Coke   Social Determinants of Health   Financial Resource Strain:   . Difficulty of Paying Living Expenses: Not on file  Food Insecurity:   . Worried About Charity fundraiser in the Last Year: Not on file  . Ran Out of Food in the Last Year: Not on file  Transportation Needs:   . Lack of Transportation (Medical): Not on file  . Lack of Transportation (Non-Medical): Not on file  Physical Activity:   . Days of Exercise per Week: Not on file  . Minutes of Exercise per Session: Not on file  Stress:   . Feeling of Stress : Not on file  Social Connections:   . Frequency of Communication with Friends and Family: Not on file  . Frequency of Social Gatherings with Friends and Family: Not on file  . Attends Religious Services: Not on file  . Active Member of Clubs or Organizations: Not on file  . Attends Archivist Meetings: Not on file  . Marital Status:  Not on file    Family History  Problem Relation Age of Onset  . Prostate cancer Father   . Cancer Father        prostate  . Cancer Mother        waldenstruns microgobular anemia  . Hyperparathyroidism Mother   . Alcohol abuse Other   . Heart disease Other   .  Hypertension Other   . Diabetes Other   . Pancreatic cancer Other   . Hyperparathyroidism Sister   . Cancer Maternal Grandfather        pancreatic    Review of Systems  Constitutional: Negative for chills and fever.  Respiratory: Positive for cough (chronic, less) and shortness of breath (chronic, stable). Negative for wheezing.   Cardiovascular: Positive for leg swelling (rare). Negative for chest pain and palpitations.  Neurological: Positive for headaches. Negative for dizziness and light-headedness.       Objective:   Vitals:   11/27/19 1331  BP: 124/78  Pulse: 72  Temp: 98.1 F (36.7 C)  SpO2: 97%   BP Readings from Last 3 Encounters:  11/27/19 124/78  11/19/19 115/69  05/22/19 (!) 158/78   Wt Readings from Last 3 Encounters:  11/27/19 132 lb (59.9 kg)  11/19/19 131 lb (59.4 kg)  05/22/19 133 lb (60.3 kg)   Body mass index is 24.94 kg/m.   Physical Exam    Constitutional: Appears well-developed and well-nourished. No distress.  HENT:  Head: Normocephalic and atraumatic.  Neck: Neck supple. No tracheal deviation present. No thyromegaly present.  No cervical lymphadenopathy Cardiovascular: Normal rate, regular rhythm and normal heart sounds.   No murmur heard. No carotid bruit .  Mild b/l LE edema Pulmonary/Chest: Effort normal and breath sounds normal. No respiratory distress. No has no wheezes. No rales.  Skin: Skin is warm and dry. Not diaphoretic.  Psychiatric: Normal mood and affect. Behavior is normal.      Assessment & Plan:    See Problem List for Assessment and Plan of chronic medical problems.    This visit occurred during the SARS-CoV-2 public health emergency.  Safety  protocols were in place, including screening questions prior to the visit, additional usage of staff PPE, and extensive cleaning of exam room while observing appropriate contact time as indicated for disinfecting solutions.

## 2019-11-27 ENCOUNTER — Encounter: Payer: Self-pay | Admitting: Internal Medicine

## 2019-11-27 ENCOUNTER — Ambulatory Visit: Payer: Medicare PPO | Admitting: Internal Medicine

## 2019-11-27 ENCOUNTER — Other Ambulatory Visit: Payer: Self-pay

## 2019-11-27 VITALS — BP 124/78 | HR 72 | Temp 98.1°F | Ht 61.0 in | Wt 132.0 lb

## 2019-11-27 DIAGNOSIS — R7303 Prediabetes: Secondary | ICD-10-CM | POA: Diagnosis not present

## 2019-11-27 DIAGNOSIS — N1831 Chronic kidney disease, stage 3a: Secondary | ICD-10-CM | POA: Diagnosis not present

## 2019-11-27 DIAGNOSIS — I10 Essential (primary) hypertension: Secondary | ICD-10-CM | POA: Diagnosis not present

## 2019-11-27 DIAGNOSIS — E7849 Other hyperlipidemia: Secondary | ICD-10-CM | POA: Diagnosis not present

## 2019-11-27 DIAGNOSIS — M85852 Other specified disorders of bone density and structure, left thigh: Secondary | ICD-10-CM

## 2019-11-27 DIAGNOSIS — K219 Gastro-esophageal reflux disease without esophagitis: Secondary | ICD-10-CM

## 2019-11-27 DIAGNOSIS — M85851 Other specified disorders of bone density and structure, right thigh: Secondary | ICD-10-CM

## 2019-11-27 LAB — COMPREHENSIVE METABOLIC PANEL
ALT: 9 U/L (ref 0–35)
AST: 15 U/L (ref 0–37)
Albumin: 3.7 g/dL (ref 3.5–5.2)
Alkaline Phosphatase: 65 U/L (ref 39–117)
BUN: 27 mg/dL — ABNORMAL HIGH (ref 6–23)
CO2: 23 mEq/L (ref 19–32)
Calcium: 9.4 mg/dL (ref 8.4–10.5)
Chloride: 111 mEq/L (ref 96–112)
Creatinine, Ser: 1.03 mg/dL (ref 0.40–1.20)
GFR: 51.46 mL/min — ABNORMAL LOW (ref >60.00)
Glucose, Bld: 93 mg/dL (ref 70–99)
Potassium: 4.8 mEq/L (ref 3.5–5.1)
Sodium: 141 mEq/L (ref 135–145)
Total Bilirubin: 0.2 mg/dL (ref 0.2–1.2)
Total Protein: 7 g/dL (ref 6.0–8.3)

## 2019-11-27 LAB — HEMOGLOBIN A1C: Hgb A1c MFr Bld: 6.2 % (ref 4.6–6.5)

## 2019-11-27 LAB — LIPID PANEL
Cholesterol: 148 mg/dL (ref 0–200)
HDL: 73.2 mg/dL (ref >39.00)
LDL Cholesterol: 55 mg/dL (ref 0–99)
NonHDL: 74.32
Total CHOL/HDL Ratio: 2
Triglycerides: 95 mg/dL (ref 0.0–149.0)
VLDL: 19 mg/dL (ref 0.0–40.0)

## 2019-11-27 MED ORDER — FLUTICASONE PROPIONATE 50 MCG/ACT NA SUSP: 1.0000 | Freq: Every day | NASAL | 1 refills | Status: DC

## 2019-11-27 MED ORDER — ESOMEPRAZOLE MAGNESIUM 40 MG PO CPDR: 40.0000 mg | DELAYED_RELEASE_CAPSULE | Freq: Every day | ORAL | 1 refills | Status: DC

## 2019-11-27 NOTE — Assessment & Plan Note (Signed)
Chronic BP well controlled Current regimen effective and well tolerated Continue current medications at current doses cmp  

## 2019-11-27 NOTE — Assessment & Plan Note (Signed)
Chronic Check lipid panel  Continue daily statin Regular exercise and healthy diet encouraged  

## 2019-11-27 NOTE — Assessment & Plan Note (Signed)
Chronic Check a1c Low sugar / carb diet Stressed regular exercise  

## 2019-11-27 NOTE — Assessment & Plan Note (Signed)
Follows with nephrology cmp 

## 2019-11-27 NOTE — Assessment & Plan Note (Addendum)
Chronic GERD controlled Continue daily medication - taking nexium QOD, pepcid daily Will try to decrease nexium more slowly

## 2019-11-28 ENCOUNTER — Ambulatory Visit: Payer: Medicare PPO | Attending: Internal Medicine

## 2019-11-28 ENCOUNTER — Encounter: Payer: Self-pay | Admitting: Internal Medicine

## 2019-11-28 DIAGNOSIS — Z23 Encounter for immunization: Secondary | ICD-10-CM | POA: Insufficient documentation

## 2019-11-28 NOTE — Progress Notes (Signed)
   Covid-19 Vaccination Clinic  Name:  Joy Martin    MRN: [id number] DOB: [birth year]  11/28/2019  [name] was observed post Covid-19 immunization for 15 minutes without incidence. She was provided with Vaccine Information Sheet and instruction to access the V-Safe system.   Ms. Stem was instructed to call 911 with any severe reactions post vaccine: Marland Kitchen Difficulty breathing  . Swelling of your face and throat  . A fast heartbeat  . A bad rash all over your body  . Dizziness and weakness    Immunizations Administered    Name Date Dose VIS Date Route   Pfizer COVID-19 Vaccine 11/28/2019 11:12 AM 0.3 mL 10/19/2019 Intramuscular   Manufacturer: [place]   Lot: [id number]   [place]: [id number]

## 2019-12-18 LAB — HM MAMMOGRAPHY

## 2019-12-19 ENCOUNTER — Ambulatory Visit: Payer: Medicare PPO | Attending: Internal Medicine

## 2019-12-19 DIAGNOSIS — Z23 Encounter for immunization: Secondary | ICD-10-CM | POA: Insufficient documentation

## 2019-12-19 NOTE — Progress Notes (Signed)
   Covid-19 Vaccination Clinic  Name:  Joy Martin    MRN: [id number] DOB: 04-Aug-1941  12/19/2019  [name] was observed post Covid-19 immunization for 15 minutes without incidence. She was provided with Vaccine Information Sheet and instruction to access the V-Safe system.   [name] was instructed to call 911 with any severe reactions post vaccine: Marland Kitchen Difficulty breathing  . Swelling of your face and throat  . A fast heartbeat  . A bad rash all over your body  . Dizziness and weakness    Immunizations Administered    Name Date Dose VIS Date Route   Pfizer COVID-19 Vaccine 12/19/2019  3:52 PM 0.3 mL 10/19/2019 Intramuscular   Manufacturer: [place]   Lot: [id number]   [place]: [id number]

## 2020-01-08 ENCOUNTER — Telehealth: Payer: Self-pay

## 2020-01-08 MED ORDER — FAMOTIDINE 40 MG PO TABS: 40.0000 mg | ORAL_TABLET | Freq: Every day | ORAL | 1 refills | Status: DC

## 2020-01-08 NOTE — Telephone Encounter (Signed)
Patient states that the pharmacy sent a request last week and has not heard back. Please advise.    1.Medication Requested:  simvastatin (ZOCOR) 40 MG tablet  famotidine (PEPCID) 40 MG tablet   2. Pharmacy (Name, [place]):  [id number] - [place], [place] AT [address]  3. On Med List:   yes  4. Last Visit with PCP:   11/27/2019  5. Next visit date with PCP:  05/27/2020

## 2020-01-08 NOTE — Telephone Encounter (Signed)
Rx sent 

## 2020-01-11 ENCOUNTER — Telehealth: Payer: Self-pay

## 2020-01-11 MED ORDER — SIMVASTATIN 40 MG PO TABS: ORAL_TABLET | ORAL | 0 refills | Status: DC

## 2020-01-11 NOTE — Telephone Encounter (Signed)
Rx sent 

## 2020-01-11 NOTE — Telephone Encounter (Signed)
1.Medication Requested:simvastatin (ZOCOR) 40 MG tablet  2. Pharmacy (Name, [address], [place] [address], [place]   3. On Med List: Yes   4. Last Visit with PCP: 1.19.2021   5. Next visit date with PCP: 7.20.2021    Agent: Please be advised that RX refills may take up to 3 business days. We ask that you follow-up with your pharmacy.

## 2020-01-30 ENCOUNTER — Other Ambulatory Visit: Payer: Self-pay | Admitting: Internal Medicine

## 2020-02-15 DIAGNOSIS — H5213 Myopia, bilateral: Secondary | ICD-10-CM | POA: Diagnosis not present

## 2020-02-15 DIAGNOSIS — H43813 Vitreous degeneration, bilateral: Secondary | ICD-10-CM | POA: Diagnosis not present

## 2020-02-15 DIAGNOSIS — Z961 Presence of intraocular lens: Secondary | ICD-10-CM | POA: Diagnosis not present

## 2020-02-15 DIAGNOSIS — H04123 Dry eye syndrome of bilateral lacrimal glands: Secondary | ICD-10-CM | POA: Diagnosis not present

## 2020-02-15 DIAGNOSIS — H10413 Chronic giant papillary conjunctivitis, bilateral: Secondary | ICD-10-CM | POA: Diagnosis not present

## 2020-02-15 DIAGNOSIS — H52223 Regular astigmatism, bilateral: Secondary | ICD-10-CM | POA: Diagnosis not present

## 2020-02-15 DIAGNOSIS — H40023 Open angle with borderline findings, high risk, bilateral: Secondary | ICD-10-CM | POA: Diagnosis not present

## 2020-02-22 ENCOUNTER — Other Ambulatory Visit: Payer: Self-pay | Admitting: Internal Medicine

## 2020-03-26 DIAGNOSIS — N183 Chronic kidney disease, stage 3 unspecified: Secondary | ICD-10-CM | POA: Diagnosis not present

## 2020-03-26 DIAGNOSIS — N189 Chronic kidney disease, unspecified: Secondary | ICD-10-CM | POA: Diagnosis not present

## 2020-03-31 DIAGNOSIS — N183 Chronic kidney disease, stage 3 unspecified: Secondary | ICD-10-CM | POA: Diagnosis not present

## 2020-03-31 DIAGNOSIS — I129 Hypertensive chronic kidney disease with stage 1 through stage 4 chronic kidney disease, or unspecified chronic kidney disease: Secondary | ICD-10-CM | POA: Diagnosis not present

## 2020-03-31 DIAGNOSIS — D631 Anemia in chronic kidney disease: Secondary | ICD-10-CM | POA: Diagnosis not present

## 2020-03-31 DIAGNOSIS — E213 Hyperparathyroidism, unspecified: Secondary | ICD-10-CM | POA: Diagnosis not present

## 2020-04-01 ENCOUNTER — Other Ambulatory Visit: Payer: Self-pay | Admitting: Internal Medicine

## 2020-04-10 ENCOUNTER — Other Ambulatory Visit: Payer: Self-pay

## 2020-04-10 ENCOUNTER — Encounter: Payer: Self-pay | Admitting: Internal Medicine

## 2020-04-10 ENCOUNTER — Ambulatory Visit: Payer: Medicare PPO | Admitting: Internal Medicine

## 2020-04-10 VITALS — BP 140/80 | HR 89 | Temp 98.3°F | Ht 61.0 in | Wt 130.0 lb

## 2020-04-10 DIAGNOSIS — R350 Frequency of micturition: Secondary | ICD-10-CM | POA: Diagnosis not present

## 2020-04-10 DIAGNOSIS — N3000 Acute cystitis without hematuria: Secondary | ICD-10-CM

## 2020-04-10 LAB — POC URINALSYSI DIPSTICK (AUTOMATED)
Bilirubin, UA: NEGATIVE
Blood, UA: NEGATIVE
Glucose, UA: NEGATIVE
Ketones, UA: NEGATIVE
Nitrite, UA: NEGATIVE
Protein, UA: POSITIVE — AB
Spec Grav, UA: 1.025 (ref 1.010–1.025)
Urobilinogen, UA: 0.2 E.U./dL
pH, UA: 6 (ref 5.0–8.0)

## 2020-04-10 MED ORDER — NITROFURANTOIN MONOHYD MACRO 100 MG PO CAPS: 100.0000 mg | ORAL_CAPSULE | Freq: Two times a day (BID) | ORAL | 0 refills | Status: AC

## 2020-04-10 NOTE — Patient Instructions (Signed)

## 2020-04-10 NOTE — Progress Notes (Signed)
Subjective:  Patient ID: Joy Martin, female    DOB: 1941/01/01  Age: 79 y.o. MRN: [id number]  CC: Urinary Tract Infection  This visit occurred during the SARS-CoV-2 public health emergency.  Safety protocols were in place, including screening questions prior to the visit, additional usage of staff PPE, and extensive cleaning of exam room while observing appropriate contact time as indicated for disinfecting solutions.    HPI Joy Martin presents for a 3-day history of urinary frequency, urgency, incontinence, and low-grade fever to 99.1.  Outpatient Medications Prior to Visit  Medication Sig Dispense Refill  . AIMOVIG 70 MG/ML SOAJ INJECT 70MG  INTO THE SKIN EVERY 30 DAYS 3 pen 3  . amLODipine (NORVASC) 5 MG tablet TAKE 1 TABLET BY MOUTH EVERY DAY 90 tablet 0  . aspirin-acetaminophen-caffeine (EXCEDRIN MIGRAINE) [id number] MG tablet Take by mouth every 6 (six) hours as needed for headache.    . ciclopirox (LOPROX) 0.77 % cream Apply 1 application topically 2 (two) times daily as needed. rash    . cycloSPORINE (RESTASIS) 0.05 % ophthalmic emulsion 1 drop 2 (two) times daily.      Marland Kitchen denosumab (PROLIA) 60 MG/ML SOSY injection Inject 60 mg into the skin every 6 (six) months.    . esomeprazole (NEXIUM) 40 MG capsule Take 1 capsule (40 mg total) by mouth daily. 90 capsule 1  . famotidine (PEPCID) 40 MG tablet Take 1 tablet (40 mg total) by mouth daily. 90 tablet 1  . Ferrous Sulfate (IRON) 28 MG TABS Take by mouth.    . Flaxseed, Linseed, (FLAXSEED OIL PO) Take 2 tablets by mouth daily.    . fluticasone (FLONASE) 50 MCG/ACT nasal spray Place 1 spray into both nostrils daily. 48 g 1  . nadolol (CORGARD) 40 MG tablet TAKE 1 TABLET BY MOUTH TWICE A DAY 180 tablet 1  . simvastatin (ZOCOR) 40 MG tablet TAKE 1 TABLET BY MOUTH EVERYDAY AT BEDTIME 90 tablet 0  . topiramate (TOPAMAX) 50 MG tablet TAKE 1 TABLET BY MOUTH TWICE A DAY 180 tablet 4  . valsartan (DIOVAN) 320 MG tablet TAKE 1 TABLET  BY MOUTH EVERY DAY 90 tablet 1  . cholecalciferol (VITAMIN D) 1000 units tablet Take 1,000 Units by mouth 2 (two) times daily.    . Vitamin E 180 MG CAPS Take by mouth. 2x daily OTC     No facility-administered medications prior to visit.    ROS Review of Systems  Constitutional: Positive for fever. Negative for chills, diaphoresis and fatigue.  HENT: Negative.   Eyes: Negative for visual disturbance.  Respiratory: Negative for cough, chest tightness, shortness of breath and wheezing.   Cardiovascular: Negative for chest pain, palpitations and leg swelling.  Gastrointestinal: Negative for abdominal pain, constipation, diarrhea, nausea and vomiting.  Endocrine: Negative.   Genitourinary: Positive for frequency and urgency. Negative for decreased urine volume, difficulty urinating, dysuria, flank pain, hematuria, vaginal bleeding and vaginal discharge.  Musculoskeletal: Negative.  Negative for arthralgias and myalgias.  Skin: Negative.  Negative for color change.  Allergic/Immunologic: Negative.   Neurological: Negative.  Negative for dizziness, weakness and headaches.  Hematological: Negative for adenopathy. Does not bruise/bleed easily.  Psychiatric/Behavioral: Negative.     Objective:  BP 140/80 (BP Location: Left Arm, Patient Position: Sitting, Cuff Size: Large)   Pulse 89   Temp 98.3 F (36.8 C) (Oral)   Ht 5\' 1"  (1.549 m)   Wt 130 lb (59 kg)   SpO2 96%   BMI 24.56 kg/m  BP Readings from Last 3 Encounters:  04/10/20 140/80  11/27/19 124/78  11/19/19 115/69    Wt Readings from Last 3 Encounters:  04/10/20 130 lb (59 kg)  11/27/19 132 lb (59.9 kg)  11/19/19 131 lb (59.4 kg)    Physical Exam Vitals reviewed.  Constitutional:      Appearance: Normal appearance.  HENT:     Nose: Nose normal.     Mouth/Throat:     Mouth: Mucous membranes are moist.  Eyes:     General: No scleral icterus.    Conjunctiva/sclera: Conjunctivae normal.  Cardiovascular:     Rate  and Rhythm: Normal rate and regular rhythm.     Heart sounds: No murmur.  Pulmonary:     Effort: Pulmonary effort is normal.     Breath sounds: No stridor. No wheezing, rhonchi or rales.  Abdominal:     General: Abdomen is flat. Bowel sounds are normal. There is no distension.     Palpations: Abdomen is soft.     Tenderness: There is no abdominal tenderness.  Musculoskeletal:        General: Normal range of motion.     Cervical back: Neck supple.     Right lower leg: No edema.     Left lower leg: No edema.  Lymphadenopathy:     Cervical: No cervical adenopathy.  Skin:    General: Skin is warm and dry.     Coloration: Skin is not pale.  Neurological:     General: No focal deficit present.     Mental Status: She is alert.  Psychiatric:        Mood and Affect: Mood normal.        Behavior: Behavior normal.     Lab Results  Component Value Date   WBC 6.4 05/22/2019   HGB 11.9 (L) 05/22/2019   HCT 37.5 05/22/2019   PLT 232.0 05/22/2019   GLUCOSE 93 11/27/2019   CHOL 148 11/27/2019   TRIG 95.0 11/27/2019   HDL 73.20 11/27/2019   LDLDIRECT 143.2 12/19/2007   LDLCALC 55 11/27/2019   ALT 9 11/27/2019   AST 15 11/27/2019   NA 141 11/27/2019   K 4.8 11/27/2019   CL 111 11/27/2019   CREATININE 1.03 11/27/2019   BUN 27 (H) 11/27/2019   CO2 23 11/27/2019   TSH 2.82 05/22/2019   HGBA1C 6.2 11/27/2019    DG Chest 2 View  Result Date: 03/21/2018 CLINICAL DATA:  Cough and congestion.  Recent pneumonia. EXAM: CHEST - 2 VIEW COMPARISON:  Chest x-ray 02/21/2018, 01/26/2018, and 07/01/2013. FINDINGS: Changes of COPD are again noted. Superimposed right middle lobe and lingular airspace opacities are present. There are no significant effusions. Atherosclerotic changes are present at the aortic arch. Exaggerated thoracic kyphosis is again noted. IMPRESSION: 1. Superimposed right middle lobe and lingular pneumonia. 2. Otherwise stable changes of COPD. Electronically Signed   By:  San Morelle M.D.   On: 03/21/2018 16:09    Assessment & Plan:   Joy Martin was seen today for urinary tract infection.  Diagnoses and all orders for this visit:  Urinary frequency -     CULTURE, URINE COMPREHENSIVE; Future -     POCT Urinalysis Dipstick (Automated) -     CULTURE, URINE COMPREHENSIVE  Acute cystitis without hematuria -     nitrofurantoin, macrocrystal-monohydrate, (MACROBID) 100 MG capsule; Take 1 capsule (100 mg total) by mouth 2 (two) times daily for 5 days.   I have discontinued Joy Martin "Judy"'s cholecalciferol  and Vitamin E. I am also having her start on nitrofurantoin (macrocrystal-monohydrate). Additionally, I am having her maintain her cycloSPORINE, ciclopirox, (Flaxseed, Linseed, (FLAXSEED OIL PO)), aspirin-acetaminophen-caffeine, denosumab, Aimovig, topiramate, fluticasone, esomeprazole, famotidine, nadolol, valsartan, amLODipine, simvastatin, and Iron.  Meds ordered this encounter  Medications  . nitrofurantoin, macrocrystal-monohydrate, (MACROBID) 100 MG capsule    Sig: Take 1 capsule (100 mg total) by mouth 2 (two) times daily for 5 days.    Dispense:  10 capsule    Refill:  0     Follow-up: Return in about 2 weeks (around 04/24/2020).  Scarlette Calico, MD

## 2020-04-12 LAB — CULTURE, URINE COMPREHENSIVE: RESULT:: NO GROWTH

## 2020-04-24 ENCOUNTER — Other Ambulatory Visit: Payer: Self-pay

## 2020-04-24 ENCOUNTER — Encounter: Payer: Self-pay | Admitting: Internal Medicine

## 2020-04-24 ENCOUNTER — Ambulatory Visit: Payer: Medicare PPO | Admitting: Internal Medicine

## 2020-04-24 VITALS — BP 150/72 | HR 69 | Temp 97.6°F | Ht 61.0 in | Wt 127.0 lb

## 2020-04-24 DIAGNOSIS — N1831 Chronic kidney disease, stage 3a: Secondary | ICD-10-CM | POA: Diagnosis not present

## 2020-04-24 DIAGNOSIS — I10 Essential (primary) hypertension: Secondary | ICD-10-CM | POA: Diagnosis not present

## 2020-04-24 NOTE — Progress Notes (Signed)
Subjective:  Patient ID: Joy Martin, female    DOB: 21-Jun-1941  Age: 79 y.o. MRN: [id number]  CC: Hypertension  This visit occurred during the SARS-CoV-2 public health emergency.  Safety protocols were in place, including screening questions prior to the visit, additional usage of staff PPE, and extensive cleaning of exam room while observing appropriate contact time as indicated for disinfecting solutions.    HPI CHAMAINE STANKUS presents for f/up - She was recently seen for signs and symptoms concerning for UTI.  Her culture was negative.  She has completed the antibiotic regimen without complications.  She tells me she recently had lab work done by her nephrologist.  Outpatient Medications Prior to Visit  Medication Sig Dispense Refill  . AIMOVIG 70 MG/ML SOAJ INJECT 70MG  INTO THE SKIN EVERY 30 DAYS 3 pen 3  . amLODipine (NORVASC) 5 MG tablet TAKE 1 TABLET BY MOUTH EVERY DAY 90 tablet 0  . ciclopirox (LOPROX) 0.77 % cream Apply 1 application topically 2 (two) times daily as needed. rash    . cycloSPORINE (RESTASIS) 0.05 % ophthalmic emulsion 1 drop 2 (two) times daily.      Marland Kitchen denosumab (PROLIA) 60 MG/ML SOSY injection Inject 60 mg into the skin every 6 (six) months.    . esomeprazole (NEXIUM) 40 MG capsule Take 1 capsule (40 mg total) by mouth daily. 90 capsule 1  . famotidine (PEPCID) 40 MG tablet Take 1 tablet (40 mg total) by mouth daily. 90 tablet 1  . Ferrous Sulfate (IRON) 28 MG TABS Take by mouth.    . Flaxseed, Linseed, (FLAXSEED OIL PO) Take 2 tablets by mouth daily.    . fluticasone (FLONASE) 50 MCG/ACT nasal spray Place 1 spray into both nostrils daily. 48 g 1  . nadolol (CORGARD) 40 MG tablet TAKE 1 TABLET BY MOUTH TWICE A DAY 180 tablet 1  . simvastatin (ZOCOR) 40 MG tablet TAKE 1 TABLET BY MOUTH EVERYDAY AT BEDTIME 90 tablet 0  . topiramate (TOPAMAX) 50 MG tablet TAKE 1 TABLET BY MOUTH TWICE A DAY 180 tablet 4  . valsartan (DIOVAN) 320 MG tablet TAKE 1 TABLET BY  MOUTH EVERY DAY 90 tablet 1  . aspirin-acetaminophen-caffeine (EXCEDRIN MIGRAINE) 568-127-51 MG tablet Take by mouth every 6 (six) hours as needed for headache.     No facility-administered medications prior to visit.    ROS Review of Systems  Constitutional: Negative for appetite change, chills, diaphoresis, fatigue and fever.  HENT: Negative.  Negative for sore throat and trouble swallowing.   Eyes: Negative.   Respiratory: Positive for cough (chronic). Negative for chest tightness, shortness of breath and wheezing.   Cardiovascular: Negative for chest pain, palpitations and leg swelling.  Gastrointestinal: Negative for abdominal pain, constipation, diarrhea, nausea and vomiting.  Endocrine: Negative.   Genitourinary: Negative.  Negative for decreased urine volume, difficulty urinating, dysuria, hematuria and urgency.  Musculoskeletal: Negative for arthralgias and myalgias.  Skin: Negative.  Negative for color change.  Neurological: Negative for dizziness, weakness, light-headedness and headaches.  Hematological: Negative for adenopathy. Does not bruise/bleed easily.  Psychiatric/Behavioral: Negative.     Objective:  BP (!) 150/72 (BP Location: Left Arm, Patient Position: Sitting, Cuff Size: Normal)   Pulse 69   Temp 97.6 F (36.4 C) (Oral)   Ht 5\' 1"  (1.549 m)   Wt 127 lb (57.6 kg)   SpO2 96%   BMI 24.00 kg/m   BP Readings from Last 3 Encounters:  04/24/20 (!) 150/72  04/10/20 140/80  11/27/19 124/78    Wt Readings from Last 3 Encounters:  04/24/20 127 lb (57.6 kg)  04/10/20 130 lb (59 kg)  11/27/19 132 lb (59.9 kg)    Physical Exam Vitals reviewed.  Constitutional:      Appearance: Normal appearance.  HENT:     Nose: Nose normal.     Mouth/Throat:     Mouth: Mucous membranes are moist.  Eyes:     General: No scleral icterus.    Conjunctiva/sclera: Conjunctivae normal.  Cardiovascular:     Rate and Rhythm: Normal rate and regular rhythm.     Heart sounds:  No murmur heard.   Pulmonary:     Effort: Pulmonary effort is normal.     Breath sounds: No stridor. No wheezing, rhonchi or rales.  Abdominal:     General: Abdomen is flat. Bowel sounds are normal. There is no distension.     Palpations: Abdomen is soft. There is no hepatomegaly, splenomegaly or mass.     Tenderness: There is no abdominal tenderness.  Musculoskeletal:        General: Normal range of motion.     Cervical back: Neck supple.     Right lower leg: No edema.     Left lower leg: No edema.  Lymphadenopathy:     Cervical: No cervical adenopathy.  Skin:    General: Skin is warm and dry.  Neurological:     General: No focal deficit present.     Mental Status: She is alert.     Lab Results  Component Value Date   WBC 6.4 05/22/2019   HGB 11.9 (L) 05/22/2019   HCT 37.5 05/22/2019   PLT 232.0 05/22/2019   GLUCOSE 93 11/27/2019   CHOL 148 11/27/2019   TRIG 95.0 11/27/2019   HDL 73.20 11/27/2019   LDLDIRECT 143.2 12/19/2007   LDLCALC 55 11/27/2019   ALT 9 11/27/2019   AST 15 11/27/2019   NA 141 11/27/2019   K 4.8 11/27/2019   CL 111 11/27/2019   CREATININE 1.03 11/27/2019   BUN 27 (H) 11/27/2019   CO2 23 11/27/2019   TSH 2.82 05/22/2019   HGBA1C 6.2 11/27/2019    DG Chest 2 View  Result Date: 03/21/2018 CLINICAL DATA:  Cough and congestion.  Recent pneumonia. EXAM: CHEST - 2 VIEW COMPARISON:  Chest x-ray 02/21/2018, 01/26/2018, and 07/01/2013. FINDINGS: Changes of COPD are again noted. Superimposed right middle lobe and lingular airspace opacities are present. There are no significant effusions. Atherosclerotic changes are present at the aortic arch. Exaggerated thoracic kyphosis is again noted. IMPRESSION: 1. Superimposed right middle lobe and lingular pneumonia. 2. Otherwise stable changes of COPD. Electronically Signed   By: San Morelle M.D.   On: 03/21/2018 16:09    Assessment & Plan:   Glynna was seen today for hypertension.  Diagnoses and all  orders for this visit:  Essential hypertension, benign- Her blood pressure is adequately well controlled.  Stage 3a chronic kidney disease- No labs were done today at her request as she recently had them done by her nephrologist.   I have discontinued Joy Martin "Judy"'s aspirin-acetaminophen-caffeine. I am also having her maintain her cycloSPORINE, ciclopirox, (Flaxseed, Linseed, (FLAXSEED OIL PO)), denosumab, Aimovig, topiramate, fluticasone, esomeprazole, famotidine, nadolol, valsartan, amLODipine, simvastatin, and Iron.  No orders of the defined types were placed in this encounter.    Follow-up: No follow-ups on file.  Scarlette Calico, MD

## 2020-04-25 NOTE — Patient Instructions (Signed)

## 2020-05-13 ENCOUNTER — Encounter: Payer: Self-pay | Admitting: Internal Medicine

## 2020-05-13 NOTE — Telephone Encounter (Signed)
Prior Auth needed. Will follow up with [employer] to check the status. Called patient and left message informing.

## 2020-05-14 ENCOUNTER — Ambulatory Visit (INDEPENDENT_AMBULATORY_CARE_PROVIDER_SITE_OTHER): Payer: Medicare PPO

## 2020-05-14 ENCOUNTER — Other Ambulatory Visit: Payer: Self-pay

## 2020-05-14 DIAGNOSIS — M85852 Other specified disorders of bone density and structure, left thigh: Secondary | ICD-10-CM

## 2020-05-14 DIAGNOSIS — M85851 Other specified disorders of bone density and structure, right thigh: Secondary | ICD-10-CM | POA: Diagnosis not present

## 2020-05-14 DIAGNOSIS — M810 Age-related osteoporosis without current pathological fracture: Secondary | ICD-10-CM | POA: Diagnosis not present

## 2020-05-14 MED ORDER — DENOSUMAB 60 MG/ML ~~LOC~~ SOSY
60.0000 mg | PREFILLED_SYRINGE | Freq: Once | SUBCUTANEOUS | Status: AC
  Administered 2020-05-14: 60 mg via SUBCUTANEOUS

## 2020-05-14 NOTE — Progress Notes (Signed)
Pt rec;d Prolia 60mg  SQ in left arm as ordered.  Tolerated well.

## 2020-05-15 ENCOUNTER — Other Ambulatory Visit: Payer: Self-pay | Admitting: Internal Medicine

## 2020-05-17 ENCOUNTER — Other Ambulatory Visit: Payer: Self-pay | Admitting: Internal Medicine

## 2020-05-20 ENCOUNTER — Other Ambulatory Visit: Payer: Self-pay | Admitting: Internal Medicine

## 2020-05-27 ENCOUNTER — Ambulatory Visit: Payer: Medicare PPO | Admitting: Internal Medicine

## 2020-06-02 NOTE — Progress Notes (Signed)
Subjective:    Patient ID: Joy Martin, female    DOB: 01/07/1941, 79 y.o.   MRN: [id number]  HPI The patient is here for follow up of their chronic medical problems, including htn, prediabetes, hyperlipidemia, CKD, GERD, OP  She walks a little.  She is not eating as well as she should.    She still has the cough, joint pain, fatigue.  She thinks she is coughing less.     She has had some weight loss - she is eating less.  She will eat breakfast and will sometimes forget to eat lunch.  She gets full quickly, then will feel hungry 10 minutes later.      Medications and allergies reviewed with patient and updated if appropriate.  Patient Active Problem List   Diagnosis Date Noted  . Weight loss 06/03/2020  . Urinary frequency 04/10/2020  . Loose stools 11/21/2018  . Hyperparathyroidism, primary ([place]) 10/29/2018  . Community acquired pneumonia 02/21/2018  . Neuralgia 01/16/2018  . Neck pain 01/16/2018  . Prediabetes 07/27/2017  . Cough 07/27/2017  . Kyphosis 05/04/2017  . Chronic shoulder bursitis, left 02/15/2017  . 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 Migraine headache 01/26/2017  . Back pain 01/26/2017  . Chronic kidney disease 01/26/2017  . Hypertensive retinopathy 11/29/2016  . Osteoarthritis of right knee 07/28/2016  . Basal cell carcinoma of skin 11/26/2015  . Abnormal CXR 01/27/2013  . Obesity 12/25/2009  . Essential hypertension, benign 12/24/2008  . ALLERGIC RHINITIS, CHRONIC 12/24/2008  . Hyperlipidemia 12/19/2007  . ANEMIA 12/19/2007  . GERD 12/19/2007  . Osteopenia 12/19/2007    Current Outpatient Medications on File Prior to Visit  Medication Sig Dispense Refill  . AIMOVIG 70 MG/ML SOAJ INJECT 70MG  INTO THE SKIN EVERY 30 DAYS 3 pen 3  . amLODipine (NORVASC) 5 MG tablet TAKE 1 TABLET BY MOUTH EVERY DAY 90 tablet 0  . ciclopirox (LOPROX) 0.77 % cream Apply 1 application topically 2 (two) times daily as needed. rash    . cycloSPORINE  (RESTASIS) 0.05 % ophthalmic emulsion 1 drop 2 (two) times daily.      Marland Kitchen denosumab (PROLIA) 60 MG/ML SOSY injection Inject 60 mg into the skin every 6 (six) months.    . esomeprazole (NEXIUM) 40 MG capsule TAKE 1 CAPSULE BY MOUTH EVERY DAY 90 capsule 1  . famotidine (PEPCID) 40 MG tablet Take 1 tablet (40 mg total) by mouth daily. 90 tablet 1  . Ferrous Sulfate (IRON) 28 MG TABS Take by mouth.    . Flaxseed, Linseed, (FLAXSEED OIL PO) Take 2 tablets by mouth daily.    . fluticasone (FLONASE) 50 MCG/ACT nasal spray Place 1 spray into both nostrils daily. 48 g 1  . nadolol (CORGARD) 40 MG tablet TAKE 1 TABLET BY MOUTH TWICE A DAY 180 tablet 1  . simvastatin (ZOCOR) 40 MG tablet TAKE 1 TABLET BY MOUTH EVERYDAY AT BEDTIME 90 tablet 0  . topiramate (TOPAMAX) 50 MG tablet TAKE 1 TABLET BY MOUTH TWICE A DAY 180 tablet 4  . valsartan (DIOVAN) 320 MG tablet TAKE 1 TABLET BY MOUTH EVERY DAY 90 tablet 1  . [DISCONTINUED] Calcium Carbonate (CALCIUM 500 PO) Take by mouth.       No current facility-administered medications on file prior to visit.    Past Medical History:  Diagnosis Date  . ALLERGIC RHINITIS, CHRONIC   . ANEMIA   . Arthritis    "knees mainly; thumbs"  . BCC (basal cell carcinoma), lip 04/2015  removed right upper lip/perinostril [name] ([place])  . Chronic sinusitis    follows with ENT for same  . CKD (chronic kidney disease) 2018  . COPD (chronic obstructive pulmonary disease) ([place]) dx 2013   GOLD 1, follows with pulm for same  . Episodic tension type headache   . EXOGENOUS OBESITY   . GERD   . HYPERLIPIDEMIA   . Hypertension   . Migraines 07/10/2012   "over; last one was @ age 37"  . OSTEOPENIA   . Pneumonia 2018-2019   x 2    Past Surgical History:  Procedure Laterality Date  . APPENDECTOMY  07/10/2012  . BREAST CYST ASPIRATION  ?1980's   right  . EYE SURGERY     both eyes  . KNEE ARTHROSCOPY  1970's or 1980's   right; torn cartilage  . LAPAROSCOPIC APPENDECTOMY   07/10/2012   Procedure: APPENDECTOMY LAPAROSCOPIC;  Surgeon: Harl Bowie, MD;  Location: [facility];  Service: General;  Laterality: N/A;  . MOHS SURGERY  01/2016   nose  . MOLE REMOVAL  1957   "my back"  . SKIN CANCER EXCISION     "2 on my back; 2 on my face"  . TONSILLECTOMY AND ADENOIDECTOMY  1946  . [place] EXTRACTION  1959    Social History   Socioeconomic History  . Marital status: Single    Spouse name: Not on file  . Number of children: 0  . Years of education: 79  . Highest education level: Not on file  Occupational History    Comment: retired Pharmacist, hospital, 5th grade  Tobacco Use  . Smoking status: Former Smoker    Packs/day: 0.75    Years: 20.00    Pack years: 15.00    Types: Cigarettes    Quit date: 07/25/1982    Years since quitting: 37.8  . Smokeless tobacco: Never Used  . Tobacco comment: 07/10/2012 "stopped smoking cigarettes 1980's"  Substance and Sexual Activity  . Alcohol use: Yes    Comment: 07/10/2012 "glass of wine 1-2X/yr"  . Drug use: No  . Sexual activity: Never  Other Topics Concern  . Not on file  Social History Narrative   Retired Pharmacist, hospital, 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   Caffeine- coffee 1 cup, some Coke   Social Determinants of Health   Financial Resource Strain:   . Difficulty of Paying Living Expenses:   Food Insecurity:   . Worried About Charity fundraiser in the Last Year:   . Arboriculturist in the Last Year:   Transportation Needs:   . Film/video editor (Medical):   Marland Kitchen Lack of Transportation (Non-Medical):   Physical Activity:   . Days of Exercise per Week:   . Minutes of Exercise per Session:   Stress:   . Feeling of Stress :   Social Connections:   . Frequency of Communication with Friends and Family:   . Frequency of Social Gatherings with Friends and Family:   . Attends Religious Services:   . Active Member of Clubs or Organizations:   . Attends Archivist Meetings:   Marland Kitchen Marital Status:     Family History  Problem  Relation Age of Onset  . Prostate cancer Father   . Cancer Father        prostate  . Cancer Mother        [place] microgobular anemia  . Hyperparathyroidism Mother   . Alcohol abuse Other   . Heart disease Other   .  Hypertension Other   . Diabetes Other   . Pancreatic cancer Other   . Hyperparathyroidism Sister   . Cancer Maternal Grandfather        pancreatic    Review of Systems  Constitutional: Positive for fatigue. Negative for chills and fever.  HENT: Positive for postnasal drip (occ).   Respiratory: Positive for cough (dry) and shortness of breath. Negative for wheezing.   Cardiovascular: Negative for chest pain, palpitations and leg swelling.  Gastrointestinal:       Occ gerd - she feels gerd is controlled  Neurological: Positive for headaches (occ). Negative for light-headedness.       Objective:   Vitals:   06/03/20 1058  BP: (!) 140/78  Pulse: 67  Temp: 97.6 F (36.4 C)  SpO2: 96%   BP Readings from Last 3 Encounters:  06/03/20 (!) 140/78  04/24/20 (!) 150/72  04/10/20 140/80   Wt Readings from Last 3 Encounters:  06/03/20 124 lb (56.2 kg)  04/24/20 127 lb (57.6 kg)  04/10/20 130 lb (59 kg)   Body mass index is 23.43 kg/m.   Physical Exam    Constitutional: Appears well-developed and well-nourished. No distress.  HENT:  Head: Normocephalic and atraumatic.  Neck: Neck supple. No tracheal deviation present. No thyromegaly present.  No cervical lymphadenopathy Cardiovascular: Normal rate, regular rhythm and normal heart sounds.   No murmur heard. No carotid bruit .  No edema Pulmonary/Chest: Effort normal and breath sounds normal. No respiratory distress. No has no wheezes. No rales.  Skin: Skin is warm and dry. Not diaphoretic.  Psychiatric: Normal mood and affect. Behavior is normal.      Assessment & Plan:    See Problem List for Assessment and Plan of chronic medical problems.    This visit occurred during the SARS-CoV-2 public  health emergency.  Safety protocols were in place, including screening questions prior to the visit, additional usage of staff PPE, and extensive cleaning of exam room while observing appropriate contact time as indicated for disinfecting solutions.

## 2020-06-02 NOTE — Assessment & Plan Note (Addendum)
Chronic Last prolia 05/14/20 dexa due - ordered  Not exercising much Not taking calcium due to hyperparathyroidism Check vitamin d level

## 2020-06-02 NOTE — Patient Instructions (Addendum)
  Blood work was ordered.     Medications reviewed and updated.  Changes include :   advair inhaler twice daily.   Your prescription(s) have been submitted to your pharmacy. Please take as directed and contact our office if you believe you are having problem(s) with the medication(s).     A bone density was ordered.   Please followup in 6 months

## 2020-06-03 ENCOUNTER — Encounter: Payer: Self-pay | Admitting: Internal Medicine

## 2020-06-03 ENCOUNTER — Ambulatory Visit: Payer: Medicare PPO | Admitting: Internal Medicine

## 2020-06-03 ENCOUNTER — Other Ambulatory Visit: Payer: Self-pay

## 2020-06-03 VITALS — BP 140/78 | HR 67 | Temp 97.6°F | Ht 61.0 in | Wt 124.0 lb

## 2020-06-03 DIAGNOSIS — D649 Anemia, unspecified: Secondary | ICD-10-CM

## 2020-06-03 DIAGNOSIS — N1831 Chronic kidney disease, stage 3a: Secondary | ICD-10-CM

## 2020-06-03 DIAGNOSIS — E7849 Other hyperlipidemia: Secondary | ICD-10-CM | POA: Diagnosis not present

## 2020-06-03 DIAGNOSIS — K219 Gastro-esophageal reflux disease without esophagitis: Secondary | ICD-10-CM | POA: Diagnosis not present

## 2020-06-03 DIAGNOSIS — R634 Abnormal weight loss: Secondary | ICD-10-CM | POA: Insufficient documentation

## 2020-06-03 DIAGNOSIS — M85852 Other specified disorders of bone density and structure, left thigh: Secondary | ICD-10-CM

## 2020-06-03 DIAGNOSIS — I10 Essential (primary) hypertension: Secondary | ICD-10-CM

## 2020-06-03 DIAGNOSIS — R7303 Prediabetes: Secondary | ICD-10-CM | POA: Diagnosis not present

## 2020-06-03 DIAGNOSIS — M85851 Other specified disorders of bone density and structure, right thigh: Secondary | ICD-10-CM

## 2020-06-03 MED ORDER — FLUTICASONE-SALMETEROL 100-50 MCG/DOSE IN AEPB: 1.0000 | INHALATION_SPRAY | Freq: Two times a day (BID) | RESPIRATORY_TRACT | 5 refills | Status: DC

## 2020-06-03 NOTE — Assessment & Plan Note (Addendum)
Chronic Following with nephrology CMP, CBC 

## 2020-06-03 NOTE — Assessment & Plan Note (Signed)
Chronic Check lipid panel  Continue daily statin Regular exercise and healthy diet encouraged  

## 2020-06-03 NOTE — Assessment & Plan Note (Signed)
Chronic Check a1c Low sugar / carb diet Stressed regular exercise  

## 2020-06-03 NOTE — Assessment & Plan Note (Signed)
Chronic Overall controlled Taking Nexium every couple of days and taking Pepcid daily Continue current regimen as long as heartburn is controlled

## 2020-06-03 NOTE — Assessment & Plan Note (Signed)
Acute Has lost some weight She states she is eating less and attributes that to the weight loss Encouraged her to make sure she is eating enough Try to increase activity which may also stimulate her appetite more Check TSH Need to monitor weight closely

## 2020-06-03 NOTE — Assessment & Plan Note (Signed)
Chronic BP well controlled Current regimen effective and well tolerated Continue current medications at current doses cmp  

## 2020-06-03 NOTE — Assessment & Plan Note (Addendum)
Iron def Taking gentle iron per nephrology Check cbc, iron panel

## 2020-06-04 LAB — LIPID PANEL
Cholesterol: 175 mg/dL (ref <200)
HDL: 69 mg/dL (ref >=50)
LDL Cholesterol (Calc): 81 mg/dL (calc)
Non-HDL Cholesterol (Calc): 106 mg/dL (calc) (ref <130)
Total CHOL/HDL Ratio: 2.5 (calc) (ref <5.0)
Triglycerides: 148 mg/dL (ref <150)

## 2020-06-04 LAB — TSH: TSH: 2.83 mIU/L (ref 0.40–4.50)

## 2020-06-04 LAB — CBC WITH DIFFERENTIAL/PLATELET
Absolute Monocytes: 475 cells/uL (ref 200–950)
Basophils Absolute: 57 cells/uL (ref 0–200)
Basophils Relative: 0.6 %
Eosinophils Absolute: 285 cells/uL (ref 15–500)
Eosinophils Relative: 3 %
HCT: 41.9 % (ref 35.0–45.0)
Hemoglobin: 13.5 g/dL (ref 11.7–15.5)
Lymphs Abs: 1254 cells/uL (ref 850–3900)
MCH: 30 pg (ref 27.0–33.0)
MCHC: 32.2 g/dL (ref 32.0–36.0)
MCV: 93.1 fL (ref 80.0–100.0)
MPV: 10.2 fL (ref 7.5–12.5)
Monocytes Relative: 5 %
Neutro Abs: 7429 cells/uL (ref 1500–7800)
Neutrophils Relative %: 78.2 %
Platelets: 279 10*3/uL (ref 140–400)
RBC: 4.5 10*6/uL (ref 3.80–5.10)
RDW: 12.9 % (ref 11.0–15.0)
Total Lymphocyte: 13.2 %
WBC: 9.5 10*3/uL (ref 3.8–10.8)

## 2020-06-04 LAB — COMPREHENSIVE METABOLIC PANEL
AG Ratio: 1.2 (calc) (ref 1.0–2.5)
ALT: 8 U/L (ref 6–29)
AST: 16 U/L (ref 10–35)
Albumin: 4.1 g/dL (ref 3.6–5.1)
Alkaline phosphatase (APISO): 84 U/L (ref 37–153)
BUN/Creatinine Ratio: 18 (calc) (ref 6–22)
BUN: 18 mg/dL (ref 7–25)
CO2: 24 mmol/L (ref 20–32)
Calcium: 10.2 mg/dL (ref 8.6–10.4)
Chloride: 112 mmol/L — ABNORMAL HIGH (ref 98–110)
Creat: 1 mg/dL — ABNORMAL HIGH (ref 0.60–0.88)
Globulin: 3.4 g/dL (calc) (ref 1.9–3.7)
Glucose, Bld: 102 mg/dL — ABNORMAL HIGH (ref 65–99)
Potassium: 4.1 mmol/L (ref 3.5–5.3)
Sodium: 143 mmol/L (ref 135–146)
Total Bilirubin: 0.3 mg/dL (ref 0.2–1.2)
Total Protein: 7.5 g/dL (ref 6.1–8.1)

## 2020-06-04 LAB — HEMOGLOBIN A1C
Hgb A1c MFr Bld: 5.6 % of total Hgb (ref <5.7)
Mean Plasma Glucose: 114 (calc)
eAG (mmol/L): 6.3 (calc)

## 2020-06-04 LAB — IRON,TIBC AND FERRITIN PANEL
Ferritin: 37 ng/mL (ref 16–288)
Iron: 83 ug/dL (ref 45–160)

## 2020-06-04 LAB — VITAMIN D 25 HYDROXY (VIT D DEFICIENCY, FRACTURES): Vit D, 25-Hydroxy: 45 ng/mL (ref 30–100)

## 2020-06-12 ENCOUNTER — Other Ambulatory Visit: Payer: Self-pay | Admitting: Internal Medicine

## 2020-06-12 DIAGNOSIS — M85852 Other specified disorders of bone density and structure, left thigh: Secondary | ICD-10-CM | POA: Diagnosis not present

## 2020-06-12 DIAGNOSIS — M85851 Other specified disorders of bone density and structure, right thigh: Secondary | ICD-10-CM | POA: Diagnosis not present

## 2020-06-12 LAB — HM DEXA SCAN

## 2020-06-13 ENCOUNTER — Encounter: Payer: Self-pay | Admitting: Internal Medicine

## 2020-06-19 ENCOUNTER — Encounter: Payer: Self-pay | Admitting: Internal Medicine

## 2020-06-19 NOTE — Progress Notes (Signed)
Outside notes received. Information abstracted. Notes sent to scan.  

## 2020-06-25 ENCOUNTER — Other Ambulatory Visit: Payer: Self-pay | Admitting: Internal Medicine

## 2020-06-27 ENCOUNTER — Other Ambulatory Visit: Payer: Self-pay | Admitting: Diagnostic Neuroimaging

## 2020-06-27 ENCOUNTER — Other Ambulatory Visit: Payer: Self-pay | Admitting: Internal Medicine

## 2020-07-08 ENCOUNTER — Other Ambulatory Visit: Payer: Self-pay | Admitting: Internal Medicine

## 2020-08-20 ENCOUNTER — Other Ambulatory Visit: Payer: Self-pay | Admitting: Internal Medicine

## 2020-08-20 ENCOUNTER — Other Ambulatory Visit: Payer: Self-pay | Admitting: Diagnostic Neuroimaging

## 2020-10-13 ENCOUNTER — Other Ambulatory Visit: Payer: Self-pay | Admitting: Internal Medicine

## 2020-10-22 ENCOUNTER — Telehealth: Payer: Self-pay

## 2020-10-22 NOTE — Telephone Encounter (Signed)
PA sent in via [facility]  (Key: [id number]) Aimovig 70MG /ML auto-injectors   Form [employer] Medicare Part D Electronic Prior Authorization Form (2017 [place])

## 2020-11-11 ENCOUNTER — Other Ambulatory Visit: Payer: Self-pay | Admitting: Internal Medicine

## 2020-11-19 ENCOUNTER — Ambulatory Visit: Payer: Medicare PPO | Admitting: Family Medicine

## 2020-11-19 ENCOUNTER — Encounter: Payer: Self-pay | Admitting: Family Medicine

## 2020-11-19 VITALS — BP 122/75 | HR 67 | Ht 60.0 in | Wt 118.2 lb

## 2020-11-19 DIAGNOSIS — G43809 Other migraine, not intractable, without status migrainosus: Secondary | ICD-10-CM

## 2020-11-19 MED ORDER — AIMOVIG 70 MG/ML ~~LOC~~ SOAJ: SUBCUTANEOUS | 3 refills | Status: DC

## 2020-11-19 MED ORDER — TOPIRAMATE 50 MG PO TABS: 50.0000 mg | ORAL_TABLET | Freq: Two times a day (BID) | ORAL | 3 refills | Status: DC

## 2020-11-19 NOTE — Progress Notes (Signed)
PATIENT: Joy Martin DOB: [birth year]  REASON FOR VISIT: follow up HISTORY FROM: patient  Chief Complaint  Patient presents with  . Follow-up    Rm 1 alone Pt is well, headaches have improved      HISTORY OF PRESENT ILLNESS: Today 11/19/20  She returns for follow-up on headaches. She continues topiramate 50 mg BID and Aimovig 70 mg monthly. She feels that, overall, headaches have improved. She may have 1-2 headaches per week. She does continue to have significant joint and muscle pain. She has neck pain that radiates to her head. NO radicular symptoms. She is trying to use Tylenol for pain management but she feels that it makes her headaches worse and does not work as well as Excedrin. She is followed closely by PCP. She is not interested in PT or pain management options at this time.  Marland Kitchen History (copied from previous notes) 11/19/2019 Joy Martin is a 80 y.o. female here today for follow up for migraines. She continues Amovig 70mg  monthly and topiramate 50mg  twice daily. Headaches continue to improve. She has mild tightness of the top of her head 4-5 times a week. She has not had a migraine recently. She continues Excedrin regularly. She usually takes two tablets every day but tries to "skip days" when she can. She reports taking Excedrin for joint pain. She is followed closely by PCP and nephrology. BP and creatinine have been normal recently. She has follow up with both in the upcoming weeks. She is seeing ophthalmology this week. She is not interested in sleep study.   HISTORY: (copied from my note on 05/16/2019)  Joy Martin is a 80 y.o. female here today for follow up of chronic migraines. She reports that her headaches have decreased in frequency. She is tolerating topiramate and Amovig well. She does continue daily use of Excedrin but has decreased the frequency. She is averaging two tablets daily. She feels that she has 5-6 headaches a week. She has not pursued sleep  referral. She does not wish to have a sleep study at this time. She does not feel headaches are debilitating and feels fairly well. Left shoulder pain has improved.    HISTORY: (copied from [place] note on 11/15/2018)  [name] a 80 year old female with a history of headaches. She returns today for follow-up. She is been taking AImoviglast 5 months. She does not notice any change in her headaches. She continues to have daily headaches. She states that she typically wakes up with a headache and then the headache will progress to either a true migraine whereas on the right side of the head. She typically will take Excedrin Migraine. She reports that Maxalt has given her no benefit. She states sometimes her headaches will progress to neck pain and then into a migraine. She continues to take Excedrin Migraine daily. She states that she has tried to limit her dose but that is the only thing that helps with her headaches. She does state that she tends to fall asleep during the day. She does not plan to take naps but does fall asleep if she sits down. She does not have anyone that lives with her. She is unsure if she snores. She does state that she has to get up multiple times at night for urinary issues. She returns today for evaluation.  HISTORYUPDATE (04/18/18, [facility]P): Since last visit, doingabout the same. Toleratingmeds.Still with daily headaches (right parietal soreness and burned sensation, sometimes pounding) and daily Excedrin usage.  Left shoulder pain improving.No alleviating or aggravating factors.  UPDATE 04/12/17: Since last visit, HA are improving. On TPX 50mg  twice a day. Continues on excedrin 2-4 tabs per day. Now with more left shoulder pain, radiating to neck and head. Seeing sports medicine clinic and trying home exercises.  UPDATE 12/07/16: Since last visit visit, continues with daily headaches. Has slightly reduced excedrin to 2-4 tabs per day. Tolerating  TPX 25mg  daily.   PRIOR HPI (08/27/16): 80 year old right-handed female here for evaluation of headaches. Around age 74 years old patient had onset of right-sided headaches with right eye pain, throbbing, nausea, proceeded by visual disturbance and aura. Patient was diagnosed with migraine headaches. During factors include menstrual cycle and red wine. She averaged 2-3 headaches per month each lasting 1-2 days. Patient was evaluated by the headache clinic in the past and tried nadolol, Imitrex, over-the-counter medications. By age 10 years old patient was going through menopause and her migraine headaches subsided. Over many years patient has also had lower grade dull sore and intermittent stabbing headaches sometimes on the right side, sometimes on the top of her head. No other associated factors. These been going on for at least 10-20 years. Patient has gradually built up to using 2-6 tablets of Excedrin over-the-counter on a daily basis. This is been going on for many many years. Patient has tried weaning off in the past but this caused rebound headaches. Patient also was taking Fioricet 20 tablets per month for many years. Patient now establish with a new PCP who is reviewing medication list and advising patient to reduce medication overusage which may be triggering analgesic overuse headache and rebound headache. Patient referred to me for further evaluation consideration of prophylactic headache therapy.   REVIEW OF SYSTEMS: Out of a complete 14 system review of symptoms, the patient complains only of the following symptoms, headaches, joint pain, neck pain  and all other reviewed systems are negative.  ALLERGIES: Allergies  Allergen Reactions  . Allegra [Fexofenadine Hcl] Other (See Comments)    Headache, "took the pill; I got flu symptoms"  . Prilosec [Omeprazole]     Fecal incontince  . Ranitidine     Fecal incontince    HOME MEDICATIONS: Outpatient Medications Prior to Visit   Medication Sig Dispense Refill  . amLODipine (NORVASC) 5 MG tablet TAKE 1 TABLET BY MOUTH EVERY DAY *NEED FOLLOW UP WITH DR IN [place] FOR REFILLS* 90 tablet 0  . cycloSPORINE (RESTASIS) 0.05 % ophthalmic emulsion 1 drop 2 (two) times daily.    Marland Kitchen denosumab (PROLIA) 60 MG/ML SOSY injection Inject 60 mg into the skin every 6 (six) months.    . famotidine (PEPCID) 40 MG tablet TAKE 1 TABLET BY MOUTH EVERY DAY 90 tablet 1  . Ferrous Sulfate (IRON) 28 MG TABS Take by mouth.    . Flaxseed, Linseed, (FLAXSEED OIL PO) Take 2 tablets by mouth daily.    . fluticasone (FLONASE) 50 MCG/ACT nasal spray Place 1 spray into both nostrils daily. 48 g 1  . Fluticasone-Salmeterol (ADVAIR DISKUS) 100-50 MCG/DOSE AEPB Inhale 1 puff into the lungs 2 (two) times daily. 60 each 5  . nadolol (CORGARD) 40 MG tablet TAKE 1 TABLET BY MOUTH TWICE A DAY 180 tablet 1  . simvastatin (ZOCOR) 40 MG tablet TAKE 1 TABLET BY MOUTH EVERYDAY AT BEDTIME 90 tablet 0  . valsartan (DIOVAN) 320 MG tablet TAKE 1 TABLET BY MOUTH EVERY DAY 90 tablet 1  . AIMOVIG 70 MG/ML SOAJ INJECT 70MG  INTO THE  SKIN EVERY 30 DAYS 1 mL 3  . topiramate (TOPAMAX) 50 MG tablet TAKE 1 TABLET BY MOUTH TWICE A DAY 180 tablet 4  . ciclopirox (LOPROX) 0.77 % cream Apply 1 application topically 2 (two) times daily as needed. rash (Patient not taking: Reported on 11/19/2020)    . esomeprazole (NEXIUM) 40 MG capsule TAKE 1 CAPSULE BY MOUTH EVERY DAY (Patient not taking: Reported on 11/19/2020) 90 capsule 1   No facility-administered medications prior to visit.    PAST MEDICAL HISTORY: Past Medical History:  Diagnosis Date  . ALLERGIC RHINITIS, CHRONIC   . ANEMIA   . Arthritis    "knees mainly; thumbs"  . 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 Chronic sinusitis    follows with ENT for same  . CKD (chronic kidney disease) 2018  . COPD (chronic obstructive pulmonary disease) ([place]) dx 2013   GOLD 1, follows  with pulm for same  . Episodic tension type headache   . EXOGENOUS OBESITY   . GERD   . HYPERLIPIDEMIA   . Hypertension   . Migraines 07/10/2012   "over; last one was @ age 53"  . OSTEOPENIA   . Pneumonia 2018-2019   x 2    PAST SURGICAL HISTORY: Past Surgical History:  Procedure Laterality Date  . APPENDECTOMY  07/10/2012  . BREAST CYST ASPIRATION  ?1980's   right  . EYE SURGERY     both eyes  . KNEE ARTHROSCOPY  1970's or 1980's   right; torn cartilage  . LAPAROSCOPIC APPENDECTOMY  07/10/2012   Procedure: APPENDECTOMY LAPAROSCOPIC;  Surgeon: Harl Bowie, MD;  Location: [facility];  Service: General;  Laterality: N/A;  . MOHS SURGERY  01/2016   nose  . MOLE REMOVAL  1957   "my back"  . SKIN CANCER EXCISION     "2 on my back; 2 on my face"  . TONSILLECTOMY AND ADENOIDECTOMY  1946  . WISDOM TOOTH EXTRACTION  1959    FAMILY HISTORY: Family History  Problem Relation Age of Onset  . Prostate cancer Father   . Cancer Father        prostate  . Cancer Mother        waldenstruns microgobular anemia  . Hyperparathyroidism Mother   . Alcohol abuse Other   . Heart disease Other   . Hypertension Other   . Diabetes Other   . Pancreatic cancer Other   . Hyperparathyroidism Sister   . Cancer Maternal Grandfather        pancreatic    SOCIAL HISTORY: Social History   Socioeconomic History  . Marital status: Single    Spouse name: Not on file  . Number of children: 0  . Years of education: 24  . Highest education level: Not on file  Occupational History    Comment: retired Pharmacist, hospital, 5th grade  Tobacco Use  . Smoking status: Former Smoker    Packs/day: 0.75    Years: 20.00    Pack years: 15.00    Types: Cigarettes    Quit date: 07/25/1982    Years since quitting: 38.3  . Smokeless tobacco: Never Used  . Tobacco comment: 07/10/2012 "stopped smoking cigarettes 1980's"  Substance and Sexual Activity  . Alcohol use: Yes    Comment: 07/10/2012 "glass of wine 1-2X/yr"  .  Drug use: No  . Sexual activity: Never  Other Topics Concern  . Not on file  Social History Narrative  Retired Pharmacist, hospital, 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   Caffeine- coffee 1 cup, some Coke   Social Determinants of Radio broadcast assistant Strain: Not on file  Food Insecurity: Not on file  Transportation Needs: Not on file  Physical Activity: Not on file  Stress: Not on file  Social Connections: Not on file  Intimate Partner Violence: Not on file      PHYSICAL EXAM  Vitals:   11/19/20 1130  BP: 122/75  Pulse: 67  Weight: 118 lb 3.2 oz (53.6 kg)  Height: 5' (1.524 m)   Body mass index is 23.08 kg/m.  Generalized: Well developed, in no acute distress  Cardiology: normal rate and rhythm, no murmur noted Respiratory: clear to auscultation bilaterally Neurological examination  Mentation: Alert oriented to time, place, history taking. Follows all commands speech and language fluent Cranial nerve II-XII: Pupils were equal round reactive to light. Extraocular movements were full, visual field were full on confrontational test. Facial sensation and strength were normal. Head turning and shoulder shrug  were normal and symmetric. Motor: The motor testing reveals 5 over 5 strength of all 4 extremities. Good symmetric motor tone is noted throughout.  Sensory: Sensory testing is intact to soft touch on all 4 extremities. No evidence of extinction is noted.  Gait and station: Gait is normal.   DIAGNOSTIC DATA (LABS, IMAGING, TESTING) - I reviewed patient records, labs, notes, testing and imaging myself where available.  MMSE - Mini Mental State Exam 07/28/2016  Not completed: (No Data)     Lab Results  Component Value Date   WBC 9.5 06/03/2020   HGB 13.5 06/03/2020   HCT 41.9 06/03/2020   MCV 93.1 06/03/2020   PLT 279 06/03/2020      Component Value Date/Time   NA 143 06/03/2020 1153   K 4.1 06/03/2020 1153   CL 112 (H) 06/03/2020 1153   CO2 24 06/03/2020 1153   GLUCOSE 102 (H)  06/03/2020 1153   BUN 18 06/03/2020 1153   CREATININE 1.00 (H) 06/03/2020 1153   CALCIUM 10.2 06/03/2020 1153   PROT 7.5 06/03/2020 1153   ALBUMIN 3.7 11/27/2019 1414   AST 16 06/03/2020 1153   ALT 8 06/03/2020 1153   ALKPHOS 65 11/27/2019 1414   BILITOT 0.3 06/03/2020 1153   GFRNONAA 53 (L) 07/10/2012 1120   GFRAA 61 (L) 07/10/2012 1120   Lab Results  Component Value Date   CHOL 175 06/03/2020   HDL 69 06/03/2020   LDLCALC 81 06/03/2020   LDLDIRECT 143.2 12/19/2007   TRIG 148 06/03/2020   CHOLHDL 2.5 06/03/2020   Lab Results  Component Value Date   HGBA1C 5.6 06/03/2020   No results found for: VITAMINB12 Lab Results  Component Value Date   TSH 2.83 06/03/2020     ASSESSMENT AND PLAN 80 y.o. year old female  has a past medical history of ALLERGIC RHINITIS, CHRONIC, ANEMIA, Arthritis, BCC (basal cell carcinoma), lip (04/2015), Chronic sinusitis, CKD (chronic kidney disease) (2018), COPD (chronic obstructive pulmonary disease) ([place]) (dx 2013), Episodic tension type headache, EXOGENOUS OBESITY, GERD, HYPERLIPIDEMIA, Hypertension, Migraines (07/10/2012), OSTEOPENIA, and Pneumonia (2018-2019). here with     ICD-10-CM   1. Other migraine without status migrainosus, not intractable  G43.809      Bethena Roys is doing well. She feels that headaches are well managed at this time. She will continue Amovig and topiramate. We have discussed concerns of neck pain. She is not interested in treatment options at this time. She was encouraged to continue  discussion with PCP if needed. She will continue healthy lifestyle habits. She may follow up with PCP for refills, if willing. Otherwise, she will return to see me in 1 year. She verbalizes understanding and agreement with this plan.    No orders of the defined types were placed in this encounter.    Meds ordered this encounter  Medications  . topiramate (TOPAMAX) 50 MG tablet    Sig: Take 1 tablet (50 mg total) by mouth 2 (two) times daily.     Dispense:  180 tablet    Refill:  3    Order Specific Question:   Supervising Provider    Answer:   Melvenia Beam [id number]  . Erenumab-aooe (AIMOVIG) 70 MG/ML SOAJ    Sig: INJECT 70MG  INTO THE SKIN EVERY 30 DAYS    Dispense:  3 mL    Refill:  3    Order Specific Question:   Supervising Provider    Answer:   Melvenia Beam [id number]      I spent 15 minutes with the patient. 50% of this time was spent counseling and educating patient on plan of care and medications.    Debbora Presto, FNP-C 11/19/2020, 12:35 PM [facility] [address] [place], [place] [address] [phone]

## 2020-11-19 NOTE — Patient Instructions (Addendum)
Below is our plan:  We will continue topiramate and Aimovig.  Please make sure you are staying well hydrated. I recommend 50-60 ounces daily. Well balanced diet and regular exercise encouraged.    Please continue follow up with care team as directed.   Follow up with PCP if willing, otherwise, with me in 1 year, sooner if needed   You may receive a survey regarding today's visit. I encourage you to leave honest feed back as I do use this information to improve patient care. Thank you for seeing me today!     Neck Exercises Ask your health care provider which exercises are safe for you. Do exercises exactly as told by your health care provider and adjust them as directed. It is normal to feel mild stretching, pulling, tightness, or discomfort as you do these exercises. Stop right away if you feel sudden pain or your pain gets worse. Do not begin these exercises until told by your health care provider. Neck exercises can be important for many reasons. They can improve strength and maintain flexibility in your neck, which will help your upper back and prevent neck pain. Stretching exercises Rotation neck stretching 1. Sit in a chair or stand up. 2. Place your feet flat on the floor, shoulder width apart. 3. Slowly turn your head (rotate) to the right until a slight stretch is felt. Turn it all the way to the right so you can look over your right shoulder. Do not tilt or tip your head. 4. Hold this position for 10-30 seconds. 5. Slowly turn your head (rotate) to the left until a slight stretch is felt. Turn it all the way to the left so you can look over your left shoulder. Do not tilt or tip your head. 6. Hold this position for 10-30 seconds. Repeat __________ times. Complete this exercise __________ times a day.   Neck retraction 1. Sit in a sturdy chair or stand up. 2. Look straight ahead. Do not bend your neck. 3. Use your fingers to push your chin backward (retraction). Do not bend  your neck for this movement. Continue to face straight ahead. If you are doing the exercise properly, you will feel a slight sensation in your throat and a stretch at the back of your neck. 4. Hold the stretch for 1-2 seconds. Repeat __________ times. Complete this exercise __________ times a day. Strengthening exercises Neck press 1. Lie on your back on a firm bed or on the floor with a pillow under your head. 2. Use your neck muscles to push your head down on the pillow and straighten your spine. 3. Hold the position as well as you can. Keep your head facing up (in a neutral position) and your chin tucked. 4. Slowly count to 5 while holding this position. Repeat __________ times. Complete this exercise __________ times a day. Isometrics These are exercises in which you strengthen the muscles in your neck while keeping your neck still (isometrics). 1. Sit in a supportive chair and place your hand on your forehead. 2. Keep your head and face facing straight ahead. Do not flex or extend your neck while doing isometrics. 3. Push forward with your head and neck while pushing back with your hand. Hold for 10 seconds. 4. Do the sequence again, this time putting your hand against the back of your head. Use your head and neck to push backward against the hand pressure. 5. Finally, do the same exercise on either side of your head, pushing sideways  against the pressure of your hand. Repeat __________ times. Complete this exercise __________ times a day. Prone head lifts 1. Lie face-down (prone position), resting on your elbows so that your chest and upper back are raised. 2. Start with your head facing downward, near your chest. Position your chin either on or near your chest. 3. Slowly lift your head upward. Lift until you are looking straight ahead. Then continue lifting your head as far back as you can comfortably stretch. 4. Hold your head up for 5 seconds. Then slowly lower it to your starting  position. Repeat __________ times. Complete this exercise __________ times a day. Supine head lifts 1. Lie on your back (supine position), bending your knees to point to the ceiling and keeping your feet flat on the floor. 2. Lift your head slowly off the floor, raising your chin toward your chest. 3. Hold for 5 seconds. Repeat __________ times. Complete this exercise __________ times a day. Scapular retraction 1. Stand with your arms at your sides. Look straight ahead. 2. Slowly pull both shoulders (scapulae) backward and downward (retraction) until you feel a stretch between your shoulder blades in your upper back. 3. Hold for 10-30 seconds. 4. Relax and repeat. Repeat __________ times. Complete this exercise __________ times a day. Contact a health care provider if:  Your neck pain or discomfort gets much worse when you do an exercise.  Your neck pain or discomfort does not improve within 2 hours after you exercise. If you have any of these problems, stop exercising right away. Do not do the exercises again unless your health care provider says that you can. Get help right away if:  You develop sudden, severe neck pain. If this happens, stop exercising right away. Do not do the exercises again unless your health care provider says that you can. This information is not intended to replace advice given to you by your health care provider. Make sure you discuss any questions you have with your health care provider. Document Revised: 08/23/2018 Document Reviewed: 08/23/2018 Elsevier Patient Education  2021 [place].   Migraine Headache A migraine headache is a very strong throbbing pain on one side or both sides of your head. This type of headache can also cause other symptoms. It can last from 4 hours to 3 days. Talk with your doctor about what things may bring on (trigger) this condition. What are the causes? The exact cause of this condition is not known. This condition may be  triggered or caused by:  Drinking alcohol.  Smoking.  Taking medicines, such as: ? Medicine used to treat chest pain (nitroglycerin). ? Birth control pills. ? Estrogen. ? Some blood pressure medicines.  Eating or drinking certain products.  Doing physical activity. Other things that may trigger a migraine headache include:  Having a menstrual period.  Pregnancy.  Hunger.  Stress.  Not getting enough sleep or getting too much sleep.  Weather changes.  Tiredness (fatigue). What increases the risk?  Being 34-60 years old.  Being female.  Having a family history of migraine headaches.  Being Caucasian.  Having depression or anxiety.  Being very overweight. What are the signs or symptoms?  A throbbing pain. This pain may: ? Happen in any area of the head, such as on one side or both sides. ? Make it hard to do daily activities. ? Get worse with physical activity. ? Get worse around bright lights or loud noises.  Other symptoms may include: ? Feeling sick to your  stomach (nauseous). ? Vomiting. ? Dizziness. ? Being sensitive to bright lights, loud noises, or smells.  Before you get a migraine headache, you may get warning signs (an aura). An aura may include: ? Seeing flashing lights or having blind spots. ? Seeing bright spots, halos, or zigzag lines. ? Having tunnel vision or blurred vision. ? Having numbness or a tingling feeling. ? Having trouble talking. ? Having weak muscles.  Some people have symptoms after a migraine headache (postdromal phase), such as: ? Tiredness. ? Trouble thinking (concentrating). How is this treated?  Taking medicines that: ? Relieve pain. ? Relieve the feeling of being sick to your stomach. ? Prevent migraine headaches.  Treatment may also include: ? Having acupuncture. ? Avoiding foods that bring on migraine headaches. ? Learning ways to control your body functions (biofeedback). ? Therapy to help you know and  deal with negative thoughts (cognitive behavioral therapy). Follow these instructions at home: Medicines  Take over-the-counter and prescription medicines only as told by your doctor.  Ask your doctor if the medicine prescribed to you: ? Requires you to avoid driving or using heavy machinery. ? Can cause trouble pooping (constipation). You may need to take these steps to prevent or treat trouble pooping:  Drink enough fluid to keep your pee (urine) pale yellow.  Take over-the-counter or prescription medicines.  Eat foods that are high in fiber. These include beans, whole grains, and fresh fruits and vegetables.  Limit foods that are high in fat and sugar. These include fried or sweet foods. Lifestyle  Do not drink alcohol.  Do not use any products that contain nicotine or tobacco, such as cigarettes, e-cigarettes, and chewing tobacco. If you need help quitting, ask your doctor.  Get at least 8 hours of sleep every night.  Limit and deal with stress. General instructions  Keep a journal to find out what may bring on your migraine headaches. For example, write down: ? What you eat and drink. ? How much sleep you get. ? Any change in what you eat or drink. ? Any change in your medicines.  If you have a migraine headache: ? Avoid things that make your symptoms worse, such as bright lights. ? It may help to lie down in a dark, quiet room. ? Do not drive or use heavy machinery. ? Ask your doctor what activities are safe for you.  Keep all follow-up visits as told by your doctor. This is important.      Contact a doctor if:  You get a migraine headache that is different or worse than others you have had.  You have more than 15 headache days in one month. Get help right away if:  Your migraine headache gets very bad.  Your migraine headache lasts longer than 72 hours.  You have a fever.  You have a stiff neck.  You have trouble seeing.  Your muscles feel weak or  like you cannot control them.  You start to lose your balance a lot.  You start to have trouble walking.  You pass out (faint).  You have a seizure. Summary  A migraine headache is a very strong throbbing pain on one side or both sides of your head. These headaches can also cause other symptoms.  This condition may be treated with medicines and changes to your lifestyle.  Keep a journal to find out what may bring on your migraine headaches.  Contact a doctor if you get a migraine headache that is different or  worse than others you have had.  Contact your doctor if you have more than 15 headache days in a month. This information is not intended to replace advice given to you by your health care provider. Make sure you discuss any questions you have with your health care provider. Document Revised: 02/16/2019 Document Reviewed: 12/07/2018 Elsevier Patient Education  [place].

## 2020-11-20 ENCOUNTER — Ambulatory Visit (INDEPENDENT_AMBULATORY_CARE_PROVIDER_SITE_OTHER): Payer: Medicare PPO

## 2020-11-20 ENCOUNTER — Other Ambulatory Visit: Payer: Self-pay

## 2020-11-20 DIAGNOSIS — M85851 Other specified disorders of bone density and structure, right thigh: Secondary | ICD-10-CM | POA: Diagnosis not present

## 2020-11-20 DIAGNOSIS — M85852 Other specified disorders of bone density and structure, left thigh: Secondary | ICD-10-CM

## 2020-11-20 MED ORDER — DENOSUMAB 60 MG/ML ~~LOC~~ SOSY
60.0000 mg | PREFILLED_SYRINGE | Freq: Once | SUBCUTANEOUS | Status: AC
  Administered 2020-11-20: 60 mg via SUBCUTANEOUS

## 2020-11-20 NOTE — Progress Notes (Signed)
Prolia given

## 2020-12-04 ENCOUNTER — Other Ambulatory Visit: Payer: Self-pay

## 2020-12-04 NOTE — Patient Instructions (Addendum)
  Blood work ad a chest xray ordered.     Medications changes include :   none    Please followup in 3 months

## 2020-12-04 NOTE — Progress Notes (Signed)
Subjective:    Patient ID: Joy Martin, female    DOB: 1941-02-21, 80 y.o.   MRN: [id number]  HPI The patient is here for follow up of their chronic medical problems, including htn, prediabetes, hyperlipidemia, CKD, GERD, OP  She is more short of breath.  She thinks that shortness of breath has gotten worse over the last 6-7 months.  She notices this with walking around.  She is not using advair - not sure if she should use it or not due to her OP.   No regular exercise.  Weight loss - She is a terrible cook and does not eat.  Sometimes she forgets to eat -will fall asleep and when she wakes it is too late to eat.  This is a chronic issue - not new.  She does not think anything with her eating her lifestyle has changed over the past several months.  Medications and allergies reviewed with patient and updated if appropriate.  Patient Active Problem List   Diagnosis Date Noted  . Weight loss 06/03/2020  . Hyperparathyroidism, primary ([place]) 10/29/2018  . Community acquired pneumonia 02/21/2018  . Neuralgia 01/16/2018  . Neck pain 01/16/2018  . Prediabetes 07/27/2017  . Cough 07/27/2017  . Kyphosis 05/04/2017  . Chronic shoulder bursitis, left 02/15/2017  . 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 Migraine headache 01/26/2017  . Back pain 01/26/2017  . Chronic kidney disease 01/26/2017  . Hypertensive retinopathy 11/29/2016  . Osteoarthritis of right knee 07/28/2016  . Basal cell carcinoma of skin 11/26/2015  . Abnormal CXR 01/27/2013  . Essential hypertension, benign 12/24/2008  . ALLERGIC RHINITIS, CHRONIC 12/24/2008  . Hyperlipidemia 12/19/2007  . ANEMIA 12/19/2007  . GERD 12/19/2007  . Osteopenia 12/19/2007    Current Outpatient Medications on File Prior to Visit  Medication Sig Dispense Refill  . amLODipine (NORVASC) 5 MG tablet TAKE 1 TABLET BY MOUTH EVERY DAY *NEED FOLLOW UP WITH DR IN [place]AN FOR REFILLS* 90 tablet 0  . cycloSPORINE (RESTASIS) 0.05 %  ophthalmic emulsion 1 drop 2 (two) times daily.    Marland Kitchen denosumab (PROLIA) 60 MG/ML SOSY injection Inject 60 mg into the skin every 6 (six) months.    Eduard Roux (AIMOVIG) 70 MG/ML SOAJ INJECT 70MG  INTO THE SKIN EVERY 30 DAYS 3 mL 3  . famotidine (PEPCID) 40 MG tablet TAKE 1 TABLET BY MOUTH EVERY DAY 90 tablet 1  . Ferrous Sulfate (IRON) 28 MG TABS Take by mouth.    . Flaxseed, Linseed, (FLAXSEED OIL PO) Take 2 tablets by mouth daily.    . fluticasone (FLONASE) 50 MCG/ACT nasal spray Place 1 spray into both nostrils daily. 48 g 1  . Fluticasone-Salmeterol (ADVAIR DISKUS) 100-50 MCG/DOSE AEPB Inhale 1 puff into the lungs 2 (two) times daily. 60 each 5  . nadolol (CORGARD) 40 MG tablet TAKE 1 TABLET BY MOUTH TWICE A DAY 180 tablet 1  . simvastatin (ZOCOR) 40 MG tablet TAKE 1 TABLET BY MOUTH EVERYDAY AT BEDTIME 90 tablet 0  . topiramate (TOPAMAX) 50 MG tablet Take 1 tablet (50 mg total) by mouth 2 (two) times daily. 180 tablet 3  . valsartan (DIOVAN) 320 MG tablet TAKE 1 TABLET BY MOUTH EVERY DAY 90 tablet 1  . [DISCONTINUED] Calcium Carbonate (CALCIUM 500 PO) Take by mouth.       No current facility-administered medications on file prior to visit.    Past Medical History:  Diagnosis Date  . ALLERGIC RHINITIS, CHRONIC   . ANEMIA   .  Arthritis    "knees mainly; thumbs"  . 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 Chronic sinusitis    follows with ENT for same  . CKD (chronic kidney disease) 2018  . COPD (chronic obstructive pulmonary disease) ([place]) dx 2013   GOLD 1, follows with pulm for same  . Episodic tension type headache   . EXOGENOUS OBESITY   . GERD   . HYPERLIPIDEMIA   . Hypertension   . Migraines 07/10/2012   "over; last one was @ age 27"  . OSTEOPENIA   . Pneumonia 2018-2019   x 2    Past Surgical History:  Procedure Laterality Date  . APPENDECTOMY  07/10/2012  . BREAST CYST ASPIRATION  ?1980's   right  . EYE  SURGERY     both eyes  . KNEE ARTHROSCOPY  1970's or 1980's   right; torn cartilage  . LAPAROSCOPIC APPENDECTOMY  07/10/2012   Procedure: APPENDECTOMY LAPAROSCOPIC;  Surgeon: Harl Bowie, MD;  Location: [facility];  Service: General;  Laterality: N/A;  . MOHS SURGERY  01/2016   nose  . MOLE REMOVAL  1957   "my back"  . SKIN CANCER EXCISION     "2 on my back; 2 on my face"  . TONSILLECTOMY AND ADENOIDECTOMY  1946  . [place] EXTRACTION  1959    Social History   Socioeconomic History  . Marital status: Single    Spouse name: Not on file  . Number of children: 0  . Years of education: 52  . Highest education level: Not on file  Occupational History    Comment: retired Pharmacist, hospital, 5th grade  Tobacco Use  . Smoking status: Former Smoker    Packs/day: 0.75    Years: 20.00    Pack years: 15.00    Types: Cigarettes    Quit date: 07/25/1982    Years since quitting: 38.3  . Smokeless tobacco: Never Used  . Tobacco comment: 07/10/2012 "stopped smoking cigarettes 1980's"  Substance and Sexual Activity  . Alcohol use: Yes    Comment: 07/10/2012 "glass of wine 1-2X/yr"  . Drug use: No  . Sexual activity: Never  Other Topics Concern  . Not on file  Social History Narrative   Retired Pharmacist, hospital, 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   Caffeine- coffee 1 cup, some Coke   Social Determinants of Health   Financial Resource Strain: Not on file  Food Insecurity: Not on file  Transportation Needs: Not on file  Physical Activity: Not on file  Stress: Not on file  Social Connections: Not on file    Family History  Problem Relation Age of Onset  . Prostate cancer Father   . Cancer Father        prostate  . Cancer Mother        [place] microgobular anemia  . Hyperparathyroidism Mother   . Alcohol abuse Other   . Heart disease Other   . Hypertension Other   . Diabetes Other   . Pancreatic cancer Other   . Hyperparathyroidism Sister   . Cancer Maternal Grandfather        pancreatic     Review of Systems  Constitutional: Positive for appetite change (dec), fatigue and unexpected weight change. Negative for fever.  HENT: Positive for rhinorrhea.   Respiratory: Positive for cough (? allergies) and shortness of breath (walking short distances - she knows part of it is related to her kypohosis and bending over). Negative  for wheezing.   Cardiovascular: Negative for chest pain (occ with cough), palpitations and leg swelling.  Gastrointestinal: Negative for abdominal pain, blood in stool, constipation, diarrhea and nausea.  Genitourinary: Negative for dysuria.  Musculoskeletal: Positive for arthralgias and back pain (occ).  Neurological: Positive for light-headedness (occ). Negative for headaches.       Objective:   Vitals:   12/05/20 1134  BP: 122/78  Pulse: 75  Temp: 98 F (36.7 C)  SpO2: 91%   BP Readings from Last 3 Encounters:  12/05/20 122/78  11/19/20 122/75  06/03/20 (!) 140/78   Wt Readings from Last 3 Encounters:  12/05/20 116 lb (52.6 kg)  11/19/20 118 lb 3.2 oz (53.6 kg)  06/03/20 124 lb (56.2 kg)   Body mass index is 22.65 kg/m.   Physical Exam    Constitutional: Thin frail female with temporal wasting. No distress.  HENT:  Head: Normocephalic and atraumatic.  Neck: Neck supple. No tracheal deviation present. No thyromegaly present.  No cervical lymphadenopathy Cardiovascular: Normal rate, regular rhythm and normal heart sounds.   No murmur heard. No carotid bruit .  No edema Pulmonary/Chest: Effort normal and breath sounds normal. No respiratory distress. No has no wheezes. No rales.  Skin: Skin is warm and dry. Not diaphoretic.  Psychiatric: Normal mood and affect. Behavior is normal.      Assessment & Plan:    See Problem List for Assessment and Plan of chronic medical problems.    This visit occurred during the SARS-CoV-2 public health emergency.  Safety protocols were in place, including screening questions prior to the  visit, additional usage of staff PPE, and extensive cleaning of exam room while observing appropriate contact time as indicated for disinfecting solutions.

## 2020-12-05 ENCOUNTER — Ambulatory Visit: Payer: Medicare PPO | Admitting: Internal Medicine

## 2020-12-05 ENCOUNTER — Ambulatory Visit (INDEPENDENT_AMBULATORY_CARE_PROVIDER_SITE_OTHER): Payer: Medicare PPO

## 2020-12-05 ENCOUNTER — Encounter: Payer: Self-pay | Admitting: Internal Medicine

## 2020-12-05 VITALS — BP 122/78 | HR 75 | Temp 98.0°F | Ht 60.0 in | Wt 116.0 lb

## 2020-12-05 DIAGNOSIS — R634 Abnormal weight loss: Secondary | ICD-10-CM

## 2020-12-05 DIAGNOSIS — G43809 Other migraine, not intractable, without status migrainosus: Secondary | ICD-10-CM | POA: Diagnosis not present

## 2020-12-05 DIAGNOSIS — M85851 Other specified disorders of bone density and structure, right thigh: Secondary | ICD-10-CM | POA: Diagnosis not present

## 2020-12-05 DIAGNOSIS — E7849 Other hyperlipidemia: Secondary | ICD-10-CM

## 2020-12-05 DIAGNOSIS — R0602 Shortness of breath: Secondary | ICD-10-CM | POA: Diagnosis not present

## 2020-12-05 DIAGNOSIS — M85852 Other specified disorders of bone density and structure, left thigh: Secondary | ICD-10-CM | POA: Diagnosis not present

## 2020-12-05 DIAGNOSIS — I10 Essential (primary) hypertension: Secondary | ICD-10-CM

## 2020-12-05 DIAGNOSIS — N1831 Chronic kidney disease, stage 3a: Secondary | ICD-10-CM | POA: Diagnosis not present

## 2020-12-05 DIAGNOSIS — K219 Gastro-esophageal reflux disease without esophagitis: Secondary | ICD-10-CM

## 2020-12-05 DIAGNOSIS — R7303 Prediabetes: Secondary | ICD-10-CM

## 2020-12-05 DIAGNOSIS — E440 Moderate protein-calorie malnutrition: Secondary | ICD-10-CM

## 2020-12-05 LAB — LIPID PANEL
Cholesterol: 164 mg/dL (ref 0–200)
HDL: 69.5 mg/dL (ref >39.00)
LDL Cholesterol: 71 mg/dL (ref 0–99)
NonHDL: 94.97
Total CHOL/HDL Ratio: 2
Triglycerides: 118 mg/dL (ref 0.0–149.0)
VLDL: 23.6 mg/dL (ref 0.0–40.0)

## 2020-12-05 LAB — COMPREHENSIVE METABOLIC PANEL
ALT: 8 U/L (ref 0–35)
AST: 14 U/L (ref 0–37)
Albumin: 4.1 g/dL (ref 3.5–5.2)
Alkaline Phosphatase: 75 U/L (ref 39–117)
BUN: 23 mg/dL (ref 6–23)
CO2: 28 mEq/L (ref 19–32)
Calcium: 12 mg/dL — ABNORMAL HIGH (ref 8.4–10.5)
Chloride: 107 mEq/L (ref 96–112)
Creatinine, Ser: 1.09 mg/dL (ref 0.40–1.20)
GFR: 47.55 mL/min — ABNORMAL LOW (ref >60.00)
Glucose, Bld: 108 mg/dL — ABNORMAL HIGH (ref 70–99)
Potassium: 4.6 mEq/L (ref 3.5–5.1)
Sodium: 141 mEq/L (ref 135–145)
Total Bilirubin: 0.4 mg/dL (ref 0.2–1.2)
Total Protein: 7.8 g/dL (ref 6.0–8.3)

## 2020-12-05 LAB — CBC WITH DIFFERENTIAL/PLATELET
Basophils Absolute: 0.1 10*3/uL (ref 0.0–0.1)
Basophils Relative: 1 % (ref 0.0–3.0)
Eosinophils Absolute: 0.3 10*3/uL (ref 0.0–0.7)
Eosinophils Relative: 2.5 % (ref 0.0–5.0)
HCT: 44.3 % (ref 36.0–46.0)
Hemoglobin: 14.3 g/dL (ref 12.0–15.0)
Lymphocytes Relative: 13.7 % (ref 12.0–46.0)
Lymphs Abs: 1.5 10*3/uL (ref 0.7–4.0)
MCHC: 32.3 g/dL (ref 30.0–36.0)
MCV: 95 fl (ref 78.0–100.0)
Monocytes Absolute: 0.7 10*3/uL (ref 0.1–1.0)
Monocytes Relative: 6.6 % (ref 3.0–12.0)
Neutro Abs: 8.1 10*3/uL — ABNORMAL HIGH (ref 1.4–7.7)
Neutrophils Relative %: 76.2 % (ref 43.0–77.0)
Platelets: 249 10*3/uL (ref 150.0–400.0)
RBC: 4.66 Mil/uL (ref 3.87–5.11)
RDW: 14.3 % (ref 11.5–15.5)
WBC: 10.6 10*3/uL — ABNORMAL HIGH (ref 4.0–10.5)

## 2020-12-05 LAB — HEMOGLOBIN A1C: Hgb A1c MFr Bld: 6.1 % (ref 4.6–6.5)

## 2020-12-05 LAB — TSH: TSH: 2.26 u[IU]/mL (ref 0.35–4.50)

## 2020-12-05 LAB — VITAMIN D 25 HYDROXY (VIT D DEFICIENCY, FRACTURES): VITD: 40.79 ng/mL (ref 30.00–100.00)

## 2020-12-05 NOTE — Assessment & Plan Note (Signed)
Chronic Check A1c 

## 2020-12-05 NOTE — Assessment & Plan Note (Signed)
Chronic Following with nephrology CMP 

## 2020-12-05 NOTE — Assessment & Plan Note (Signed)
Subacute Continues to lose weight She denies any changes in her eating habits and is not exercising-she has never been a good cook and sometimes forgets to eat and does not eat much, but this is not new and does not explain all of her weight loss Check blood work today including CBC, CMP TSH Chest x-ray for shortness of breath Need to identify if there is something else going on causing the weight loss She has been on Topamax for a long time and this is not new so likely is not contributing

## 2020-12-05 NOTE — Assessment & Plan Note (Signed)
Chronic Stopped nexium and not having any gerd advised ok to take nexium prn if needed

## 2020-12-05 NOTE — Assessment & Plan Note (Signed)
Chronic Controlled on current regimen I will prescribe her medication given stability and control - will no longer see neuro Will continue the aimovig 70 mg q month and topamax 50 mg BID

## 2020-12-05 NOTE — Assessment & Plan Note (Signed)
Chronic Check lipid panel  Continue simvastatin 40 mg daily Regular exercise and healthy diet encouraged  

## 2020-12-05 NOTE — Assessment & Plan Note (Signed)
Acute Temporal wasting on exam Has lost weight over the past year-he was in the low 130s and is now 116 She knows she does not eat well and is not eating a well-balanced diet Encouraged increasing her protein caloric intake Check blood work today including CBC, CMP and TSH She is not eating enough which may explain her malnutrition and weight loss, but need to rule out other causes May benefit from seeing a nutritionist in the future

## 2020-12-05 NOTE — Assessment & Plan Note (Signed)
Chronic DEXA up-to-date On Prolia every 6 months-continue Check vitamin D level

## 2020-12-05 NOTE — Assessment & Plan Note (Signed)
Chronic BP well controlled Continue valsartan 320 mg daily, nadolol 40 mg twice daily and amlodipine 5 mg daily cmp

## 2020-12-07 ENCOUNTER — Other Ambulatory Visit: Payer: Self-pay | Admitting: Internal Medicine

## 2020-12-07 NOTE — Telephone Encounter (Signed)
Please call her with her results   Kidney function decreased- slightly worse than prior.  Cholesterol is good.  Liver tests, blood counts, thyroid function are normal.  Vitamin d is good. Sugars in prediabetic range.    Chest xray shows possible infection - I would like her to take two antibiotics.  This may be part of the cause of symptoms, including her weight loss.    Antibiotics pending - please send to pharmacy

## 2020-12-08 MED ORDER — CEFPODOXIME PROXETIL 200 MG PO TABS: 200.0000 mg | ORAL_TABLET | Freq: Two times a day (BID) | ORAL | 0 refills | Status: AC

## 2020-12-08 MED ORDER — AZITHROMYCIN 250 MG PO TABS: ORAL_TABLET | ORAL | 0 refills | Status: DC

## 2020-12-09 ENCOUNTER — Telehealth: Payer: Self-pay | Admitting: Internal Medicine

## 2020-12-09 NOTE — Telephone Encounter (Signed)
Patient calling, states someone called her yesterday about her health condition and she would like to talk to someone because she has a few questions

## 2020-12-10 NOTE — Telephone Encounter (Signed)
Spoke with patient today. 

## 2020-12-10 NOTE — Telephone Encounter (Signed)
   Patient called back, states she wanted to know if she is taking an antibiotic for "something contagious" She is having company this weekend and wants additional information regarding the azithromycin (ZITHROMAX) 250 MG tablet

## 2020-12-15 NOTE — Progress Notes (Signed)
I reviewed note and agree with plan.   Penni Bombard, MD 07/11/8100, 7:51 PM Certified in Neurology, Neurophysiology and Neuroimaging  [facility] [address], [place], [place] [address] [phone]

## 2020-12-23 DIAGNOSIS — Z1231 Encounter for screening mammogram for malignant neoplasm of breast: Secondary | ICD-10-CM | POA: Diagnosis not present

## 2020-12-23 LAB — HM MAMMOGRAPHY

## 2020-12-28 ENCOUNTER — Other Ambulatory Visit: Payer: Self-pay | Admitting: Internal Medicine

## 2020-12-30 ENCOUNTER — Telehealth: Payer: Self-pay | Admitting: Internal Medicine

## 2020-12-30 NOTE — Telephone Encounter (Signed)
Prolia authorization extended 6.15.21 thru 12.31.22 Auth# [id number] Spoke to Ander Purpura 2.17.22 Last Prolia injection 1.13.22, next due after May 20, 2021

## 2021-01-09 ENCOUNTER — Other Ambulatory Visit: Payer: Self-pay | Admitting: Internal Medicine

## 2021-01-15 ENCOUNTER — Encounter: Payer: Self-pay | Admitting: Internal Medicine

## 2021-01-15 NOTE — Progress Notes (Signed)
Outside notes received. Information abstracted. Notes sent to scan.  

## 2021-02-03 ENCOUNTER — Other Ambulatory Visit: Payer: Self-pay | Admitting: Internal Medicine

## 2021-02-17 DIAGNOSIS — Z961 Presence of intraocular lens: Secondary | ICD-10-CM | POA: Diagnosis not present

## 2021-02-17 DIAGNOSIS — H5213 Myopia, bilateral: Secondary | ICD-10-CM | POA: Diagnosis not present

## 2021-02-17 DIAGNOSIS — H40023 Open angle with borderline findings, high risk, bilateral: Secondary | ICD-10-CM | POA: Diagnosis not present

## 2021-02-17 DIAGNOSIS — H10413 Chronic giant papillary conjunctivitis, bilateral: Secondary | ICD-10-CM | POA: Diagnosis not present

## 2021-03-04 ENCOUNTER — Encounter: Payer: Self-pay | Admitting: Internal Medicine

## 2021-03-04 NOTE — Progress Notes (Signed)
Subjective:    Patient ID: Joy Martin, female    DOB: 21-Apr-1941, 80 y.o.   MRN: [id number]  HPI The patient is here for follow up of their chronic medical problems, including htn, prediabetes, hyperlipidemia, CKD, GERD, OP, weight loss, migraines  She had some weight loss - she was eating less.   Wt Readings from Last 3 Encounters:  03/05/21 117 lb (53.1 kg)  03/05/21 117 lb 9.6 oz (53.3 kg)  12/05/20 116 lb (52.6 kg)   Her sister was in town recently and did a lot of cooking and she has been eating more and better.  She does not like to cook.  She is a picky eater and does not think she would like meals on wheels.    Increased posterior headaches - comes from neck pain and stiffness.   Medications and allergies reviewed with patient and updated if appropriate.  Patient Active Problem List   Diagnosis Date Noted  . Protein-calorie malnutrition, moderate ([place]) 12/05/2020  . Weight loss 06/03/2020  . Hyperparathyroidism, primary ([place]) 10/29/2018  . Neuralgia 01/16/2018  . Neck pain 01/16/2018  . Prediabetes 07/27/2017  . Cough 07/27/2017  . Kyphosis 05/04/2017  . Chronic shoulder bursitis, left 02/15/2017  . 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 Migraine headache 01/26/2017  . Back pain 01/26/2017  . Chronic kidney disease 01/26/2017  . Hypertensive retinopathy 11/29/2016  . Osteoarthritis of right knee 07/28/2016  . Basal cell carcinoma of skin 11/26/2015  . Abnormal CXR 01/27/2013  . Essential hypertension, benign 12/24/2008  . ALLERGIC RHINITIS, CHRONIC 12/24/2008  . Hyperlipidemia 12/19/2007  . ANEMIA 12/19/2007  . GERD 12/19/2007  . Osteopenia 12/19/2007    Current Outpatient Medications on File Prior to Visit  Medication Sig Dispense Refill  . amLODipine (NORVASC) 5 MG tablet TAKE 1 TABLET BY MOUTH EVERY DAY *NEED FOLLOW UP WITH DR IN [place]AN FOR REFILLS* 90 tablet 0  . cycloSPORINE (RESTASIS) 0.05 % ophthalmic emulsion 1 drop 2 (two) times daily.     Marland Kitchen denosumab (PROLIA) 60 MG/ML SOSY injection Inject 60 mg into the skin every 6 (six) months.    Eduard Roux (AIMOVIG) 70 MG/ML SOAJ INJECT 70MG  INTO THE SKIN EVERY 30 DAYS 3 mL 3  . famotidine (PEPCID) 40 MG tablet TAKE 1 TABLET BY MOUTH EVERY DAY 90 tablet 1  . Ferrous Sulfate (IRON) 28 MG TABS Take by mouth.    . Flaxseed, Linseed, (FLAXSEED OIL PO) Take 2 tablets by mouth daily.    . fluticasone (FLONASE) 50 MCG/ACT nasal spray Place 1 spray into both nostrils daily. 48 g 1  . Fluticasone-Salmeterol (ADVAIR DISKUS) 100-50 MCG/DOSE AEPB Inhale 1 puff into the lungs 2 (two) times daily. 60 each 5  . nadolol (CORGARD) 40 MG tablet TAKE 1 TABLET BY MOUTH TWICE A DAY 180 tablet 1  . simvastatin (ZOCOR) 40 MG tablet TAKE 1 TABLET BY MOUTH EVERYDAY AT BEDTIME 90 tablet 0  . topiramate (TOPAMAX) 50 MG tablet Take 1 tablet (50 mg total) by mouth 2 (two) times daily. 180 tablet 3  . valsartan (DIOVAN) 320 MG tablet TAKE 1 TABLET BY MOUTH EVERY DAY 90 tablet 1  . [DISCONTINUED] Calcium Carbonate (CALCIUM 500 PO) Take by mouth.       No current facility-administered medications on file prior to visit.    Past Medical History:  Diagnosis Date  . ALLERGIC RHINITIS, CHRONIC   . ANEMIA   . Arthritis    "knees mainly; thumbs"  . BCC (  basal cell carcinoma), lip 04/2015   removed right upper lip/perinostril [name] ([facility])  . Chronic sinusitis    follows with ENT for same  . CKD (chronic kidney disease) 2018  . COPD (chronic obstructive pulmonary disease) ([place]) dx 2013   GOLD 1, follows with pulm for same  . Episodic tension type headache   . EXOGENOUS OBESITY   . GERD   . HYPERLIPIDEMIA   . Hypertension   . Migraines 07/10/2012   "over; last one was @ age 35"  . OSTEOPENIA   . Pneumonia 2018-2019   x 2    Past Surgical History:  Procedure Laterality Date  . APPENDECTOMY  07/10/2012  . BREAST CYST ASPIRATION  ?1980's   right  . EYE SURGERY     both eyes  . KNEE ARTHROSCOPY  1970's  or 1980's   right; torn cartilage  . LAPAROSCOPIC APPENDECTOMY  07/10/2012   Procedure: APPENDECTOMY LAPAROSCOPIC;  Surgeon: Harl Bowie, MD;  Location: [facility];  Service: General;  Laterality: N/A;  . MOHS SURGERY  01/2016   nose  . MOLE REMOVAL  1957   "my back"  . SKIN CANCER EXCISION     "2 on my back; 2 on my face"  . TONSILLECTOMY AND ADENOIDECTOMY  1946  . [place] EXTRACTION  1959    Social History   Socioeconomic History  . Marital status: Single    Spouse name: Not on file  . Number of children: 0  . Years of education: 11  . Highest education level: Not on file  Occupational History    Comment: retired Pharmacist, hospital, 5th grade  Tobacco Use  . Smoking status: Former Smoker    Packs/day: 0.75    Years: 20.00    Pack years: 15.00    Types: Cigarettes    Quit date: 07/25/1982    Years since quitting: 38.6  . Smokeless tobacco: Never Used  . Tobacco comment: 07/10/2012 "stopped smoking cigarettes 1980's"  Substance and Sexual Activity  . Alcohol use: Yes    Comment: 07/10/2012 "glass of wine 1-2X/yr"  . Drug use: No  . Sexual activity: Never  Other Topics Concern  . 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 Difficulty of Paying Living Expenses: Not hard at all  Food Insecurity: No Food Insecurity  . Worried About Charity fundraiser in the Last Year: Never true  . Ran Out of Food in the Last Year: Never true  Transportation Needs: No Transportation Needs  . Lack of Transportation (Medical): No  . Lack of Transportation (Non-Medical): No  Physical Activity: Inactive  . Days of Exercise per Week: 0 days  . Minutes of Exercise per Session: 0 min  Stress: No Stress Concern Present  . Feeling of Stress : Not at all  Social Connections: Moderately Integrated  . Frequency of Communication with Friends and Family: More  than three times a week  . Frequency of Social Gatherings with Friends and Family: Once a week  . Attends Religious Services: More than 4 times per year  . Active Member of Clubs or Organizations: No  . Attends Archivist Meetings: More than 4 times per year  . Marital Status: Never married    Family History  Problem Relation Age of Onset  . Prostate cancer Father   .  Cancer Father        prostate  . Cancer Mother        [place] microgobular anemia  . Hyperparathyroidism Mother   . Alcohol abuse Other   . Heart disease Other   . Hypertension Other   . Diabetes Other   . Pancreatic cancer Other   . Hyperparathyroidism Sister   . Cancer Maternal Grandfather        pancreatic    Review of Systems  Constitutional: Negative for chills, diaphoresis and fever.  Respiratory: Positive for shortness of breath (chronic - improved since being treatment for PNA). Negative for cough and wheezing.   Cardiovascular: Negative for chest pain, palpitations and leg swelling.  Musculoskeletal: Positive for neck pain.  Neurological: Positive for light-headedness (mild) and headaches (posterior).       Objective:   Vitals:   03/05/21 1128  BP: 140/80  Pulse: 65  Temp: 97.6 F (36.4 C)  SpO2: 94%   BP Readings from Last 3 Encounters:  03/05/21 140/80  03/05/21 140/80  12/05/20 122/78   Wt Readings from Last 3 Encounters:  03/05/21 117 lb (53.1 kg)  03/05/21 117 lb 9.6 oz (53.3 kg)  12/05/20 116 lb (52.6 kg)   Body mass index is 22.85 kg/m.   Physical Exam    Constitutional: Appears well-developed and well-nourished. No distress.  HENT:  Head: Normocephalic and atraumatic.  Neck: Neck supple. No tracheal deviation present. No thyromegaly present.  No cervical lymphadenopathy Cardiovascular: Normal rate, regular rhythm and normal heart sounds.   No murmur heard. No carotid bruit .  No edema Pulmonary/Chest: Effort normal and breath sounds normal. No respiratory  distress. No has no wheezes. No rales.  Skin: Skin is warm and dry. Not diaphoretic.  Psychiatric: Normal mood and affect. Behavior is normal.      Assessment & Plan:    See Problem List for Assessment and Plan of chronic medical problems.    This visit occurred during the SARS-CoV-2 public health emergency.  Safety protocols were in place, including screening questions prior to the visit, additional usage of staff PPE, and extensive cleaning of exam room while observing appropriate contact time as indicated for disinfecting solutions.

## 2021-03-04 NOTE — Patient Instructions (Addendum)
   Medications changes include :   none    Please followup in 6 months   

## 2021-03-05 ENCOUNTER — Ambulatory Visit (INDEPENDENT_AMBULATORY_CARE_PROVIDER_SITE_OTHER): Payer: Medicare PPO

## 2021-03-05 ENCOUNTER — Ambulatory Visit: Payer: Medicare PPO | Admitting: Internal Medicine

## 2021-03-05 ENCOUNTER — Other Ambulatory Visit: Payer: Self-pay

## 2021-03-05 VITALS — BP 140/80 | HR 65 | Temp 97.6°F | Ht 60.0 in | Wt 117.0 lb

## 2021-03-05 VITALS — BP 140/80 | HR 65 | Temp 97.6°F | Ht 60.0 in | Wt 117.6 lb

## 2021-03-05 DIAGNOSIS — Z0000 Encounter for general adult medical examination without abnormal findings: Secondary | ICD-10-CM | POA: Diagnosis not present

## 2021-03-05 DIAGNOSIS — R634 Abnormal weight loss: Secondary | ICD-10-CM | POA: Diagnosis not present

## 2021-03-05 DIAGNOSIS — G4486 Cervicogenic headache: Secondary | ICD-10-CM

## 2021-03-05 DIAGNOSIS — R7303 Prediabetes: Secondary | ICD-10-CM | POA: Diagnosis not present

## 2021-03-05 DIAGNOSIS — J189 Pneumonia, unspecified organism: Secondary | ICD-10-CM

## 2021-03-05 DIAGNOSIS — J449 Chronic obstructive pulmonary disease, unspecified: Secondary | ICD-10-CM | POA: Diagnosis not present

## 2021-03-05 DIAGNOSIS — K219 Gastro-esophageal reflux disease without esophagitis: Secondary | ICD-10-CM

## 2021-03-05 DIAGNOSIS — N1831 Chronic kidney disease, stage 3a: Secondary | ICD-10-CM

## 2021-03-05 DIAGNOSIS — I10 Essential (primary) hypertension: Secondary | ICD-10-CM | POA: Diagnosis not present

## 2021-03-05 DIAGNOSIS — E7849 Other hyperlipidemia: Secondary | ICD-10-CM

## 2021-03-05 NOTE — Patient Instructions (Signed)
[name] , Thank you for taking time to come for your Medicare Wellness Visit. I appreciate your ongoing commitment to your health goals. Please review the following plan we discussed and let me know if I can assist you in the future.   Screening recommendations/referrals: Colonoscopy: not a candidate for colon cancer screening due to age 80: 12/23/2020 Bone Density: 06/12/2020; due every 2 years Recommended yearly ophthalmology/optometry visit for glaucoma screening and checkup Recommended yearly dental visit for hygiene and checkup  Vaccinations: Influenza vaccine: due Fall 2022 Pneumococcal vaccine: 12/19/2007, 11/06/2013 Tdap vaccine: 12/19/2007; due every 10 years (overdue); can check with local pharmacy for vaccine Shingles vaccine: 03/03/2017, 08/29/2017   Covid-19: 11/28/2019, 12/19/2019, 08/21/2020, 02/13/2021  Advanced directives: Please bring a copy of your health care power of attorney and living will to the office at your convenience.  Conditions/risks identified: Yes. Reviewed health maintenance screenings with patient today and relevant education, vaccines, and/or referrals were provided. Continue doing brain stimulating activities (puzzles, reading, adult coloring books, staying active) to keep memory sharp. Continue to eat heart healthy diet (full of fruits, vegetables, whole grains, lean protein, water--limit salt, fat, and sugar intake) and increase physical activity as tolerated.  Next appointment: Please schedule your next Medicare Wellness Visit with your Nurse Health Advisor in 1 year by calling [phone].   Preventive Care 21 Years and Older, Female Preventive care refers to lifestyle choices and visits with your health care provider that can promote health and wellness. What does preventive care include?  A yearly physical exam. This is also called an annual well check.  Dental exams once or twice a year.  Routine eye exams. Ask your health care provider  how often you should have your eyes checked.  Personal lifestyle choices, including:  Daily care of your teeth and gums.  Regular physical activity.  Eating a healthy diet.  Avoiding tobacco and drug use.  Limiting alcohol use.  Practicing safe sex.  Taking low-dose aspirin every day.  Taking vitamin and mineral supplements as recommended by your health care provider. What happens during an annual well check? The services and screenings done by your health care provider during your annual well check will depend on your age, overall health, lifestyle risk factors, and family history of disease. Counseling  Your health care provider may ask you questions about your:  Alcohol use.  Tobacco use.  Drug use.  Emotional well-being.  Home and relationship well-being.  Sexual activity.  Eating habits.  History of falls.  Memory and ability to understand (cognition).  Work and work Statistician.  Reproductive health. Screening  You may have the following tests or measurements:  Height, weight, and BMI.  Blood pressure.  Lipid and cholesterol levels. These may be checked every 5 years, or more frequently if you are over 72 years old.  Skin check.  Lung cancer screening. You may have this screening every year starting at age 40 if you have a 30-pack-year history of smoking and currently smoke or have quit within the past 15 years.  Fecal occult blood test (FOBT) of the stool. You may have this test every year starting at age 6.  Flexible sigmoidoscopy or colonoscopy. You may have a sigmoidoscopy every 5 years or a colonoscopy every 10 years starting at age 9.  Hepatitis C blood test.  Hepatitis B blood test.  Sexually transmitted disease (STD) testing.  Diabetes screening. This is done by checking your blood sugar (glucose) after you have not eaten for a while (  fasting). You may have this done every 1-3 years.  Bone density scan. This is done to screen for  osteoporosis. You may have this done starting at age 45.  Mammogram. This may be done every 1-2 years. Talk to your health care provider about how often you should have regular mammograms. Talk with your health care provider about your test results, treatment options, and if necessary, the need for more tests. Vaccines  Your health care provider may recommend certain vaccines, such as:  Influenza vaccine. This is recommended every year.  Tetanus, diphtheria, and acellular pertussis (Tdap, Td) vaccine. You may need a Td booster every 10 years.  Zoster vaccine. You may need this after age 22.  Pneumococcal 13-valent conjugate (PCV13) vaccine. One dose is recommended after age 31.  Pneumococcal polysaccharide (PPSV23) vaccine. One dose is recommended after age 22. Talk to your health care provider about which screenings and vaccines you need and how often you need them. This information is not intended to replace advice given to you by your health care provider. Make sure you discuss any questions you have with your health care provider. Document Released: 11/21/2015 Document Revised: 07/14/2016 Document Reviewed: 08/26/2015 Elsevier Interactive Patient Education  2017 [place] Prevention in the Home Falls can cause injuries. They can happen to people of all ages. There are many things you can do to make your home safe and to help prevent falls. What can I do on the outside of my home?  Regularly fix the edges of walkways and driveways and fix any cracks.  Remove anything that might make you trip as you walk through a door, such as a raised step or threshold.  Trim any bushes or trees on the path to your home.  Use bright outdoor lighting.  Clear any walking paths of anything that might make someone trip, such as rocks or tools.  Regularly check to see if handrails are loose or broken. Make sure that both sides of any steps have handrails.  Any raised decks and porches  should have guardrails on the edges.  Have any leaves, snow, or ice cleared regularly.  Use sand or salt on walking paths during winter.  Clean up any spills in your garage right away. This includes oil or grease spills. What can I do in the bathroom?  Use night lights.  Install grab bars by the toilet and in the tub and shower. Do not use towel bars as grab bars.  Use non-skid mats or decals in the tub or shower.  If you need to sit down in the shower, use a plastic, non-slip stool.  Keep the floor dry. Clean up any water that spills on the floor as soon as it happens.  Remove soap buildup in the tub or shower regularly.  Attach bath mats securely with double-sided non-slip rug tape.  Do not have throw rugs and other things on the floor that can make you trip. What can I do in the bedroom?  Use night lights.  Make sure that you have a light by your bed that is easy to reach.  Do not use any sheets or blankets that are too big for your bed. They should not hang down onto the floor.  Have a firm chair that has side arms. You can use this for support while you get dressed.  Do not have throw rugs and other things on the floor that can make you trip. What can I do in the kitchen?  Clean up any spills right away.  Avoid walking on wet floors.  Keep items that you use a lot in easy-to-reach places.  If you need to reach something above you, use a strong step stool that has a grab bar.  Keep electrical cords out of the way.  Do not use floor polish or wax that makes floors slippery. If you must use wax, use non-skid floor wax.  Do not have throw rugs and other things on the floor that can make you trip. What can I do with my stairs?  Do not leave any items on the stairs.  Make sure that there are handrails on both sides of the stairs and use them. Fix handrails that are broken or loose. Make sure that handrails are as long as the stairways.  Check any carpeting to  make sure that it is firmly attached to the stairs. Fix any carpet that is loose or worn.  Avoid having throw rugs at the top or bottom of the stairs. If you do have throw rugs, attach them to the floor with carpet tape.  Make sure that you have a light switch at the top of the stairs and the bottom of the stairs. If you do not have them, ask someone to add them for you. What else can I do to help prevent falls?  Wear shoes that:  Do not have high heels.  Have rubber bottoms.  Are comfortable and fit you well.  Are closed at the toe. Do not wear sandals.  If you use a stepladder:  Make sure that it is fully opened. Do not climb a closed stepladder.  Make sure that both sides of the stepladder are locked into place.  Ask someone to hold it for you, if possible.  Clearly mark and make sure that you can see:  Any grab bars or handrails.  First and last steps.  Where the edge of each step is.  Use tools that help you move around (mobility aids) if they are needed. These include:  Canes.  Walkers.  Scooters.  Crutches.  Turn on the lights when you go into a dark area. Replace any light bulbs as soon as they burn out.  Set up your furniture so you have a clear path. Avoid moving your furniture around.  If any of your floors are uneven, fix them.  If there are any pets around you, be aware of where they are.  Review your medicines with your doctor. Some medicines can make you feel dizzy. This can increase your chance of falling. Ask your doctor what other things that you can do to help prevent falls. This information is not intended to replace advice given to you by your health care provider. Make sure you discuss any questions you have with your health care provider. Document Released: 08/21/2009 Document Revised: 04/01/2016 Document Reviewed: 11/29/2014 Elsevier Interactive Patient Education  2017 [employer].

## 2021-03-05 NOTE — Progress Notes (Signed)
Subjective:   Joy Martin is a 80 y.o. female who presents for Medicare Annual (Subsequent) preventive examination.  Review of Systems    No ROS. Medicare Wellness Visit. Additional risk factors are reflected in social history. Cardiac Risk Factors include: advanced age (>76men, >30 women);family history of premature cardiovascular disease;dyslipidemia;hypertension Sleep Patterns: No sleep issues, feels rested on waking and sleeps 6-7 hours nightly. Home Safety/Smoke Alarms: Feels safe in home; uses home alarm. Smoke alarms in place. Living environment: 1-story home; Lives alone; no needs for DME; good support system. Seat Belt Safety/Bike Helmet: Wears seat belt.    Objective:    Today's Vitals   03/05/21 0951  BP: 140/80  Pulse: 65  Temp: 97.6 F (36.4 C)  SpO2: 94%  Weight: 117 lb 9.6 oz (53.3 kg)  Height: 5' (1.524 m)   Body mass index is 22.97 kg/m.  Advanced Directives 03/05/2021 04/06/2017 07/28/2016 07/10/2012  Does Patient Have a Medical Advance Directive? Yes No Yes Patient has advance directive, copy not in chart  Type of Advance Directive Living will;Healthcare Power of [place]  Does patient want to make changes to medical advance directive? No - Patient declined - - -  Copy of [place] in Chart? No - copy requested - - Copy requested from other (Comment)  Would patient like information on creating a medical advance directive? - No - Patient declined - -    Current Medications (verified) Outpatient Encounter Medications as of 03/05/2021  Medication Sig  . amLODipine (NORVASC) 5 MG tablet TAKE 1 TABLET BY MOUTH EVERY DAY *NEED FOLLOW UP WITH DR IN JAN FOR REFILLS*  . cycloSPORINE (RESTASIS) 0.05 % ophthalmic emulsion 1 drop 2 (two) times daily.  Marland Kitchen denosumab (PROLIA) 60 MG/ML SOSY injection Inject 60 mg into the skin every 6 (six) months.  Eduard Roux (AIMOVIG) 70 MG/ML SOAJ INJECT 70MG  INTO THE SKIN  EVERY 30 DAYS  . famotidine (PEPCID) 40 MG tablet TAKE 1 TABLET BY MOUTH EVERY DAY  . Ferrous Sulfate (IRON) 28 MG TABS Take by mouth.  . Flaxseed, Linseed, (FLAXSEED OIL PO) Take 2 tablets by mouth daily.  . fluticasone (FLONASE) 50 MCG/ACT nasal spray Place 1 spray into both nostrils daily.  . Fluticasone-Salmeterol (ADVAIR DISKUS) 100-50 MCG/DOSE AEPB Inhale 1 puff into the lungs 2 (two) times daily.  . nadolol (CORGARD) 40 MG tablet TAKE 1 TABLET BY MOUTH TWICE A DAY  . simvastatin (ZOCOR) 40 MG tablet TAKE 1 TABLET BY MOUTH EVERYDAY AT BEDTIME  . topiramate (TOPAMAX) 50 MG tablet Take 1 tablet (50 mg total) by mouth 2 (two) times daily.  . valsartan (DIOVAN) 320 MG tablet TAKE 1 TABLET BY MOUTH EVERY DAY  . [DISCONTINUED] Calcium Carbonate (CALCIUM 500 PO) Take by mouth.     No facility-administered encounter medications on file as of 03/05/2021.    Allergies (verified) Allegra [fexofenadine hcl], Prilosec [omeprazole], and Ranitidine   History: Past Medical History:  Diagnosis Date  . ALLERGIC RHINITIS, CHRONIC   . ANEMIA   . Arthritis    "knees mainly; thumbs"  . 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 Chronic sinusitis    follows with ENT for same  . CKD (chronic kidney disease) 2018  . COPD (chronic obstructive pulmonary disease) ([place]) dx 2013   GOLD 1, follows with pulm for same  . Episodic tension type headache   . EXOGENOUS OBESITY   .  GERD   . HYPERLIPIDEMIA   . Hypertension   . Migraines 07/10/2012   "over; last one was @ age 13"  . OSTEOPENIA   . Pneumonia 2018-2019   x 2   Past Surgical History:  Procedure Laterality Date  . APPENDECTOMY  07/10/2012  . BREAST CYST ASPIRATION  ?1980's   right  . EYE SURGERY     both eyes  . KNEE ARTHROSCOPY  1970's or 1980's   right; torn cartilage  . LAPAROSCOPIC APPENDECTOMY  07/10/2012   Procedure: APPENDECTOMY LAPAROSCOPIC;  Surgeon: Harl Bowie, MD;   Location: [facility];  Service: General;  Laterality: N/A;  . MOHS SURGERY  01/2016   nose  . MOLE REMOVAL  1957   "my back"  . SKIN CANCER EXCISION     "2 on my back; 2 on my face"  . TONSILLECTOMY AND ADENOIDECTOMY  1946  . WISDOM TOOTH EXTRACTION  1959   Family History  Problem Relation Age of Onset  . Prostate cancer Father   . Cancer Father        prostate  . Cancer Mother        waldenstruns microgobular anemia  . Hyperparathyroidism Mother   . Alcohol abuse Other   . Heart disease Other   . Hypertension Other   . Diabetes Other   . Pancreatic cancer Other   . Hyperparathyroidism Sister   . Cancer Maternal Grandfather        pancreatic   Social History   Socioeconomic History  . Marital status: Single    Spouse name: Not on file  . Number of children: 0  . Years of education: 89  . Highest education level: Not on file  Occupational History    Comment: retired Pharmacist, hospital, 5th grade  Tobacco Use  . Smoking status: Former Smoker    Packs/day: 0.75    Years: 20.00    Pack years: 15.00    Types: Cigarettes    Quit date: 07/25/1982    Years since quitting: 38.6  . Smokeless tobacco: Never Used  . Tobacco comment: 07/10/2012 "stopped smoking cigarettes 1980's"  Substance and Sexual Activity  . Alcohol use: Yes    Comment: 07/10/2012 "glass of wine 1-2X/yr"  . Drug use: No  . Sexual activity: Never  Other Topics Concern  . 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 Difficulty of Paying Living Expenses: Not hard at all  Food Insecurity: No Food Insecurity  . Worried About Charity fundraiser in the Last Year: Never true  . Ran Out of Food in the Last Year: Never true  Transportation Needs: No Transportation Needs  . Lack of Transportation (Medical): No  . Lack of Transportation (Non-Medical): No  Physical Activity: Inactive   . Days of Exercise per Week: 0 days  . Minutes of Exercise per Session: 0 min  Stress: No Stress Concern Present  . Feeling of Stress : Not at all  Social Connections: Moderately Integrated  . Frequency of Communication with Friends and Family: More than three times a week  . Frequency of Social Gatherings with Friends and Family: Once a week  . Attends Religious Services: More than 4 times per year  . Active Member of Clubs or Organizations: No  . Attends Archivist Meetings: More than 4 times  per year  . Marital Status: Never married    Tobacco Counseling Counseling given: Not Answered Comment: 07/10/2012 "stopped smoking cigarettes 1980's"   Clinical Intake:  Pre-visit preparation completed: Yes  Pain : No/denies pain     BMI - recorded: 22.97 Nutritional Status: BMI of 19-24  Normal Nutritional Risks: None Diabetes: No  How often do you need to have someone help you when you read instructions, pamphlets, or other written materials from your doctor or pharmacy?: 1 - Never What is the last grade level you completed in school?: Bachelor's Degree  Diabetic? no  Interpreter Needed?: No  Information entered by :: Lisette Abu, LPN   Activities of Daily Living In your present state of health, do you have any difficulty performing the following activities: 03/05/2021 12/05/2020  Hearing? N N  Vision? N N  Difficulty concentrating or making decisions? N N  Walking or climbing stairs? N N  Dressing or bathing? N N  Doing errands, shopping? N N  Preparing Food and eating ? N -  Using the Toilet? N -  In the past six months, have you accidently leaked urine? N -  Do you have problems with loss of bowel control? N -  Managing your Medications? N -  Managing your Finances? N -  Housekeeping or managing your Housekeeping? N -  Some recent data might be hidden    Patient Care Team: [name], MD as PCP - General (Internal Medicine) Tanda Rockers, MD  (Pulmonary Disease) Melissa Montane, MD (Otolaryngology)  Indicate any recent Medical Services you may have received from other than [facility] providers in the past year (date may be approximate).     Assessment:   This is a routine wellness examination for Joy Martin.  Hearing/Vision screen No exam data present  Dietary issues and exercise activities discussed: Current Exercise Habits: The patient does not participate in regular exercise at present (due to shortness of breath), Exercise limited by: respiratory conditions(s);orthopedic condition(s)  Goals    . Weight (lb) < 155 lb (70.3 kg)     In weight watchers currently       Depression Screen PHQ 2/9 Scores 03/05/2021 06/03/2020 05/22/2019 11/21/2018 07/28/2016 05/13/2015 11/06/2013  PHQ - 2 Score 0 1 0 0 0 0 0    Fall Risk Fall Risk  03/05/2021 04/10/2020 05/22/2019 11/21/2018 04/18/2018  Falls in the past year? 1 0 0 0 No  Comment - - - - -  Number falls in past yr: 0 0 0 0 -  Injury with Fall? 1 0 - - -  Risk Factor Category  - - - - -  Risk for fall due to : Orthopedic patient No Fall Risks - - -  Follow up Falls evaluation completed Falls evaluation completed - - -    FALL RISK PREVENTION PERTAINING TO THE HOME:  Any stairs in or around the home? No  If so, are there any without handrails? No  Home free of loose throw rugs in walkways, pet beds, electrical cords, etc? Yes  Adequate lighting in your home to reduce risk of falls? Yes   ASSISTIVE DEVICES UTILIZED TO PREVENT FALLS:  Life alert? No  Use of a cane, walker or w/c? No  Grab bars in the bathroom? Yes  Shower chair or bench in shower? Yes  Elevated toilet seat or a handicapped toilet? Yes   TIMED UP AND GO:  Was the test performed? No .  Length of time to ambulate 10 feet: 0  sec.   Gait steady and fast without use of assistive device  Cognitive Function: Normal cognitive status assessed by direct observation by this Nurse Health Advisor. No abnormalities found.    MMSE - Mini Mental State Exam 07/28/2016  Not completed: (No Data)        Immunizations Immunization History  Administered Date(s) Administered  . Fluad Quad(high Dose 65+) 07/11/2020  . Influenza Split 08/13/2011, 07/18/2012, 07/22/2014  . Influenza Whole 08/23/2007, 08/21/2008, 08/05/2009, 07/29/2010  . Influenza, High Dose Seasonal PF 07/26/2013, 07/22/2014, 08/16/2018, 08/08/2019  . Influenza-Unspecified 07/19/2016, 07/19/2017, 08/16/2018  . PFIZER Comirnaty(Gray Top)Covid-19 Tri-Sucrose Vaccine 02/13/2021  . PFIZER(Purple Top)SARS-COV-2 Vaccination 11/28/2019, 12/19/2019, 08/21/2020  . Pneumococcal Conjugate-13 11/06/2013  . Pneumococcal Polysaccharide-23 12/19/2007  . Td 11/08/1996, 12/19/2007  . Zoster 03/24/2006  . Zoster Recombinat (Shingrix) 03/03/2017, 08/29/2017    TDAP status: Due, Education has been provided regarding the importance of this vaccine. Advised may receive this vaccine at local pharmacy or Health Dept. Aware to provide a copy of the vaccination record if obtained from local pharmacy or Health Dept. Verbalized acceptance and understanding.  Flu Vaccine status: Up to date  Pneumococcal vaccine status: Up to date  Covid-19 vaccine status: Completed vaccines  Qualifies for Shingles Vaccine? Yes   Zostavax completed Yes   Shingrix Completed?: Yes  Screening Tests Health Maintenance  Topic Date Due  . TETANUS/TDAP  12/18/2017  . INFLUENZA VACCINE  06/08/2021  . DEXA SCAN  06/12/2022  . COVID-19 Vaccine  Completed  . PNA vac Low Risk Adult  Completed  . HPV VACCINES  Aged Out    Health Maintenance  Health Maintenance Due  Topic Date Due  . TETANUS/TDAP  12/18/2017    Colorectal cancer screening: No longer required.   Mammogram status: Completed 12/23/2020. Repeat every year  Bone Density status: Completed 06/12/2020. Results reflect: Bone density results: OSTEOPENIA. Repeat every 2 years.  Lung Cancer Screening: (Low Dose CT Chest  recommended if Age 24-80 years, 30 pack-year currently smoking OR have quit w/in 15years.) does not qualify.   Lung Cancer Screening Referral: no  Additional Screening:  Hepatitis C Screening: does not qualify; Completed no  Vision Screening: Recommended annual ophthalmology exams for early detection of glaucoma and other disorders of the eye. Is the patient up to date with their annual eye exam?  Yes  Who is the provider or what is the name of the office in which the patient attends annual eye exams? [facility] If pt is not established with a provider, would they like to be referred to a provider to establish care? No .   Dental Screening: Recommended annual dental exams for proper oral hygiene  Community Resource Referral / Chronic Care Management: CRR required this visit?  No   CCM required this visit?  No      Plan:     I have personally reviewed and noted the following in the patient's chart:   . Medical and social history . Use of alcohol, tobacco or illicit drugs  . Current medications and supplements . Functional ability and status . Nutritional status . Physical activity . Advanced directives . List of other physicians . Hospitalizations, surgeries, and ER visits in previous 12 months . Vitals . Screenings to include cognitive, depression, and falls . Referrals and appointments  In addition, I have reviewed and discussed with patient certain preventive protocols, quality metrics, and best practice recommendations. A written personalized care plan for preventive services as well as general preventive health recommendations  were provided to patient.     Sheral Flow, LPN   [id number]   Nurse Notes:  Medications reviewed with patient; no opioid use noted.

## 2021-03-05 NOTE — Assessment & Plan Note (Signed)
Chronic Has been having posterior and top of head headaches - related to neck pain/stiffness Discussed things she can try at home Can consider referral to PT, ortho

## 2021-03-05 NOTE — Assessment & Plan Note (Signed)
Chronic Lab Results  Component Value Date   HGBA1C 6.1 12/05/2020   stable

## 2021-03-05 NOTE — Assessment & Plan Note (Signed)
Chronic BP well controlled Continue amlodipine 5 mg daily, nadolol 40 mg bid, diovan 320 mg daily

## 2021-03-05 NOTE — Assessment & Plan Note (Signed)
Chronic Continue simvastatin 40 mg daily 

## 2021-03-05 NOTE — Assessment & Plan Note (Addendum)
Chronic Weight loss has stopped - she states this is because her sister visited and did a lot of cooking - she does not like to cook and therefore does not eat much Declined meals on wheels - she is a picky eater Stressed that she needs to eat better - she states she is lazy, but will try to make more of an effort

## 2021-03-05 NOTE — Assessment & Plan Note (Signed)
Chronic Has f/u with nephrology soon

## 2021-03-06 DIAGNOSIS — L570 Actinic keratosis: Secondary | ICD-10-CM | POA: Diagnosis not present

## 2021-03-06 DIAGNOSIS — L821 Other seborrheic keratosis: Secondary | ICD-10-CM | POA: Diagnosis not present

## 2021-03-06 DIAGNOSIS — D225 Melanocytic nevi of trunk: Secondary | ICD-10-CM | POA: Diagnosis not present

## 2021-03-06 DIAGNOSIS — L814 Other melanin hyperpigmentation: Secondary | ICD-10-CM | POA: Diagnosis not present

## 2021-03-06 DIAGNOSIS — D0439 Carcinoma in situ of skin of other parts of face: Secondary | ICD-10-CM | POA: Diagnosis not present

## 2021-03-06 DIAGNOSIS — C44311 Basal cell carcinoma of skin of nose: Secondary | ICD-10-CM | POA: Diagnosis not present

## 2021-03-06 DIAGNOSIS — L720 Epidermal cyst: Secondary | ICD-10-CM | POA: Diagnosis not present

## 2021-03-06 DIAGNOSIS — D1801 Hemangioma of skin and subcutaneous tissue: Secondary | ICD-10-CM | POA: Diagnosis not present

## 2021-03-06 DIAGNOSIS — D485 Neoplasm of uncertain behavior of skin: Secondary | ICD-10-CM | POA: Diagnosis not present

## 2021-03-06 DIAGNOSIS — Z85828 Personal history of other malignant neoplasm of skin: Secondary | ICD-10-CM | POA: Diagnosis not present

## 2021-03-09 DIAGNOSIS — N183 Chronic kidney disease, stage 3 unspecified: Secondary | ICD-10-CM | POA: Diagnosis not present

## 2021-03-12 ENCOUNTER — Other Ambulatory Visit: Payer: Self-pay | Admitting: Internal Medicine

## 2021-03-17 DIAGNOSIS — N1831 Chronic kidney disease, stage 3a: Secondary | ICD-10-CM | POA: Diagnosis not present

## 2021-03-17 DIAGNOSIS — I129 Hypertensive chronic kidney disease with stage 1 through stage 4 chronic kidney disease, or unspecified chronic kidney disease: Secondary | ICD-10-CM | POA: Diagnosis not present

## 2021-03-17 DIAGNOSIS — D631 Anemia in chronic kidney disease: Secondary | ICD-10-CM | POA: Diagnosis not present

## 2021-03-17 DIAGNOSIS — E213 Hyperparathyroidism, unspecified: Secondary | ICD-10-CM | POA: Diagnosis not present

## 2021-03-26 DIAGNOSIS — C44311 Basal cell carcinoma of skin of nose: Secondary | ICD-10-CM | POA: Diagnosis not present

## 2021-03-26 DIAGNOSIS — Z85828 Personal history of other malignant neoplasm of skin: Secondary | ICD-10-CM | POA: Diagnosis not present

## 2021-04-01 ENCOUNTER — Other Ambulatory Visit: Payer: Self-pay | Admitting: Internal Medicine

## 2021-04-02 DIAGNOSIS — D0439 Carcinoma in situ of skin of other parts of face: Secondary | ICD-10-CM | POA: Diagnosis not present

## 2021-04-02 DIAGNOSIS — L570 Actinic keratosis: Secondary | ICD-10-CM | POA: Diagnosis not present

## 2021-05-05 ENCOUNTER — Other Ambulatory Visit: Payer: Self-pay | Admitting: Internal Medicine

## 2021-06-10 ENCOUNTER — Other Ambulatory Visit: Payer: Self-pay | Admitting: Internal Medicine

## 2021-06-20 ENCOUNTER — Other Ambulatory Visit: Payer: Self-pay | Admitting: Internal Medicine

## 2021-07-05 ENCOUNTER — Other Ambulatory Visit: Payer: Self-pay | Admitting: Internal Medicine

## 2021-07-10 ENCOUNTER — Other Ambulatory Visit: Payer: Self-pay | Admitting: Internal Medicine

## 2021-08-01 ENCOUNTER — Other Ambulatory Visit: Payer: Self-pay | Admitting: Internal Medicine

## 2021-08-05 ENCOUNTER — Telehealth: Payer: Self-pay

## 2021-08-05 NOTE — Telephone Encounter (Signed)
Prolia VOB initiated via [email or web address]  Last OV:  Next OV:  Last Prolia inj: 11/20/20 Next Prolia inj DUE: 05/21/21

## 2021-08-13 NOTE — Telephone Encounter (Signed)
Prior Auth required for Prolia  PA PROCESS DETAILS: PA is required. PA can be initiated by calling [phone] or online at [email or web address] 

## 2021-08-21 DIAGNOSIS — H40023 Open angle with borderline findings, high risk, bilateral: Secondary | ICD-10-CM | POA: Diagnosis not present

## 2021-08-21 DIAGNOSIS — H04123 Dry eye syndrome of bilateral lacrimal glands: Secondary | ICD-10-CM | POA: Diagnosis not present

## 2021-08-21 DIAGNOSIS — Z961 Presence of intraocular lens: Secondary | ICD-10-CM | POA: Diagnosis not present

## 2021-08-22 DIAGNOSIS — J309 Allergic rhinitis, unspecified: Secondary | ICD-10-CM | POA: Diagnosis not present

## 2021-08-22 DIAGNOSIS — G8929 Other chronic pain: Secondary | ICD-10-CM | POA: Diagnosis not present

## 2021-08-22 DIAGNOSIS — J449 Chronic obstructive pulmonary disease, unspecified: Secondary | ICD-10-CM | POA: Diagnosis not present

## 2021-08-22 DIAGNOSIS — K219 Gastro-esophageal reflux disease without esophagitis: Secondary | ICD-10-CM | POA: Diagnosis not present

## 2021-08-22 DIAGNOSIS — I10 Essential (primary) hypertension: Secondary | ICD-10-CM | POA: Diagnosis not present

## 2021-08-22 DIAGNOSIS — M1990 Unspecified osteoarthritis, unspecified site: Secondary | ICD-10-CM | POA: Diagnosis not present

## 2021-08-22 DIAGNOSIS — H04129 Dry eye syndrome of unspecified lacrimal gland: Secondary | ICD-10-CM | POA: Diagnosis not present

## 2021-08-22 DIAGNOSIS — E785 Hyperlipidemia, unspecified: Secondary | ICD-10-CM | POA: Diagnosis not present

## 2021-08-22 DIAGNOSIS — G43909 Migraine, unspecified, not intractable, without status migrainosus: Secondary | ICD-10-CM | POA: Diagnosis not present

## 2021-08-24 NOTE — Telephone Encounter (Signed)
Pt ready for scheduling on or after 05/21/21  Out-of-pocket cost due at time of visit: $0.00  Primary: [employer] Prolia co-insurance: 0% Admin fee co-insurance: 0%  Secondary: n/a Prolia co-insurance:  Admin fee co-insurance:   Deductible: does not apply  Prior Auth: APPROVED PA# [id number] Valid: 04/22/20-11/07/21  ** This summary of benefits is an estimation of the patient's out-of-pocket cost. Exact cost may vary based on individual plan coverage.

## 2021-09-03 NOTE — Patient Instructions (Addendum)
    Prolia given today    Blood work was ordered.   Have a chest xray today.     Medications changes include :   nexium 40 mg daily for one month then take every other day  Your prescription(s) have been submitted to your pharmacy. Please take as directed and contact our office if you believe you are having problem(s) with the medication(s).   A referral was ordered for cardiology.        Someone from their office will call you to schedule an appointment.    Please followup in 6 months

## 2021-09-03 NOTE — Progress Notes (Signed)
Subjective:    Patient ID: Joy Martin, female    DOB: August 10, 1941, 80 y.o.   MRN: [id number]  This visit occurred during the SARS-CoV-2 public health emergency.  Safety protocols were in place, including screening questions prior to the visit, additional usage of staff PPE, and extensive cleaning of exam room while observing appropriate contact time as indicated for disinfecting solutions.     HPI The patient is here for follow up of their chronic medical problems, including htn, prediabetes, hld, CKD, gerd, OP, weight loss, migraines, post headaches from neck  Cough came back.  She occasional brings up some sputum and it may be discolored.  She may have a little more SOB.  She may have a little more fatigue.   Her saliva is very thick.  It is bubbly.    Has chronic neck pain - no associated headaches - taking Excedrin as needed.      Medications and allergies reviewed with patient and updated if appropriate.  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   Migraine headache 01/26/2017   Back pain 01/26/2017   Chronic kidney disease 01/26/2017   Hypertensive retinopathy 11/29/2016   Osteoarthritis of right knee 07/28/2016   Basal cell carcinoma of skin 11/26/2015   Abnormal CXR 01/27/2013   Essential hypertension, benign 12/24/2008   ALLERGIC RHINITIS, CHRONIC 12/24/2008   Hyperlipidemia 12/19/2007   ANEMIA 12/19/2007   GERD 12/19/2007   Osteoporosis 12/19/2007    Current Outpatient Medications on File Prior to Visit  Medication Sig Dispense Refill   amLODipine (NORVASC) 5 MG tablet  TAKE 1 TABLET BY MOUTH EVERY DAY 90 tablet 0   cycloSPORINE (RESTASIS) 0.05 % ophthalmic emulsion 1 drop 2 (two) times daily.     denosumab (PROLIA) 60 MG/ML SOSY injection Inject 60 mg into the skin every 6 (six) months.     Erenumab-aooe (AIMOVIG) 70 MG/ML SOAJ INJECT 70MG  INTO THE SKIN EVERY 30 DAYS 3 mL 3   famotidine (PEPCID) 40 MG tablet TAKE 1 TABLET BY MOUTH EVERY DAY 90 tablet 1   Ferrous Sulfate (IRON) 28 MG TABS Take by mouth.     Flaxseed, Linseed, (FLAXSEED OIL PO) Take 2 tablets by mouth daily.     fluticasone (FLONASE) 50 MCG/ACT nasal spray USE 1 SPRAY IN EACH NOSTRIL DAILY 48 mL 1   nadolol (CORGARD) 40 MG tablet TAKE 1 TABLET BY MOUTH TWICE A DAY 180 tablet 1   simvastatin (ZOCOR) 40 MG tablet TAKE 1 TABLET BY MOUTH EVERYDAY AT BEDTIME 90 tablet 0   topiramate (TOPAMAX) 50 MG tablet Take 1 tablet (50 mg total) by mouth 2 (two) times daily. 180 tablet 3   valsartan (DIOVAN) 320 MG tablet TAKE 1 TABLET BY MOUTH EVERY DAY 90 tablet 1   WIXELA INHUB 100-50 MCG/ACT AEPB TAKE 1 PUFF BY MOUTH TWICE A DAY 60 each 5   [DISCONTINUED] Calcium Carbonate (CALCIUM 500 PO) Take by mouth.       No current facility-administered medications on file prior to visit.    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   removed right upper lip/perinostril [name] ([facility])   Chronic sinusitis    follows with ENT for same   CKD (chronic kidney disease) 2018   COPD (chronic obstructive pulmonary disease) ([place]) dx 2013   GOLD 1, follows with pulm for same   Episodic tension type headache    EXOGENOUS OBESITY    GERD    HYPERLIPIDEMIA    Hypertension    Migraines 07/10/2012   "over; last one was @ age 71"   OSTEOPENIA    Pneumonia 2018-2019   x 2    Past Surgical History:  Procedure Laterality Date   APPENDECTOMY  07/10/2012   BREAST CYST ASPIRATION  ?1980's   right   EYE  SURGERY     both eyes   KNEE ARTHROSCOPY  1970's or 1980's   right; torn cartilage   LAPAROSCOPIC APPENDECTOMY  07/10/2012   Procedure: APPENDECTOMY LAPAROSCOPIC;  Surgeon: Harl Bowie, MD;  Location: [facility];  Service: General;  Laterality: N/A;   MOHS SURGERY  01/2016   nose   MOLE REMOVAL  1957   "my back"   SKIN CANCER EXCISION     "2 on my back; 2 on my face"   [place]    Social History   Socioeconomic History   Marital status: Single    Spouse name: Not on file   Number of children: 0   Years of education: 16   Highest education level: Not on file  Occupational History    Comment: retired Pharmacist, hospital, 5th grade  Tobacco Use   Smoking status: Former    Packs/day: 0.75    Years: 20.00    Pack years: 15.00    Types: Cigarettes    Quit date: 07/25/1982    Years since quitting: 39.1   Smokeless tobacco: Never   Tobacco comments:    07/10/2012 "stopped smoking cigarettes 1980's"  Substance and Sexual Activity   Alcohol use: Yes    Comment: 07/10/2012 "glass of wine 1-2X/yr"   Drug use: No   Sexual activity: Never  Other Topics Concern   Not on file  Social History Narrative   Retired Pharmacist, hospital, 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   Caffeine- coffee 1 cup, some Coke   Social Determinants of Radio broadcast assistant Strain: Low Risk    Difficulty of Paying Living Expenses: Not hard at all  Food Insecurity: No Food Insecurity   Worried About Charity fundraiser in the Last Year: Never true   Arboriculturist in the Last Year: Never true  Transportation Needs: No Transportation Needs   Lack of Transportation (Medical): No   Lack of Transportation (Non-Medical): No  Physical Activity: Inactive   Days of Exercise per Week: 0 days   Minutes of Exercise per Session: 0 min  Stress: No Stress Concern Present   Feeling of Stress : Not at all  Social Connections: Moderately Integrated   Frequency of Communication with Friends  and Family: More than three times a week   Frequency of Social Gatherings with Friends and Family: Once a week   Attends Religious Services: More than 4 times per year   Active Member of [employer] or Organizations: No   Attends Music therapist: More than 4 times per year   Marital Status: Never married    Family History  Problem Relation Age of Onset   Prostate cancer Father    Cancer Father        prostate   Cancer Mother        waldenstruns microgobular anemia   Hyperparathyroidism Mother    Alcohol abuse Other    Heart disease Other    Hypertension Other    Diabetes Other    Pancreatic cancer Other    Hyperparathyroidism Sister    Cancer Maternal Grandfather        pancreatic    Review of Systems  Constitutional:  Negative for fever.  Respiratory:  Positive for cough and shortness of breath. Negative for wheezing.   Cardiovascular:  Negative for chest pain (occ from coughing), palpitations and leg swelling.  Neurological:  Negative for dizziness, light-headedness and headaches (controlled).      Objective:   Vitals:   09/04/21 1000  BP: 124/80  Pulse: 64  Temp: 97.9 F (36.6 C)  SpO2: 97%   BP Readings from Last 3 Encounters:  09/04/21 124/80  03/05/21 140/80  03/05/21 140/80   Wt Readings from Last 3 Encounters:  09/04/21 115 lb (52.2 kg)  03/05/21 117 lb (53.1 kg)  03/05/21 117 lb 9.6 oz (53.3 kg)   Body mass index is 22.46 kg/m.   Physical Exam    Constitutional: Appears well-developed and well-nourished. No distress.  HENT:  Head: Normocephalic and atraumatic.  Neck: Neck supple. No tracheal deviation present. No thyromegaly present.  No cervical lymphadenopathy Cardiovascular: Normal rate, regular rhythm and normal heart sounds.   No murmur heard. No carotid bruit .  No edema Pulmonary/Chest: Effort normal and breath sounds normal. No respiratory distress. No has no wheezes. No rales.  Skin: Skin is warm and dry. Not diaphoretic.   Psychiatric: Normal mood and affect. Behavior is normal.      Assessment & Plan:    See Problem List for Assessment and Plan of chronic medical problems.

## 2021-09-04 ENCOUNTER — Ambulatory Visit (INDEPENDENT_AMBULATORY_CARE_PROVIDER_SITE_OTHER): Payer: Medicare PPO

## 2021-09-04 ENCOUNTER — Ambulatory Visit: Payer: Medicare PPO | Admitting: Internal Medicine

## 2021-09-04 ENCOUNTER — Other Ambulatory Visit: Payer: Self-pay

## 2021-09-04 ENCOUNTER — Encounter: Payer: Self-pay | Admitting: Internal Medicine

## 2021-09-04 VITALS — BP 124/80 | HR 64 | Temp 97.9°F | Ht 60.0 in | Wt 115.0 lb

## 2021-09-04 DIAGNOSIS — R7303 Prediabetes: Secondary | ICD-10-CM

## 2021-09-04 DIAGNOSIS — E210 Primary hyperparathyroidism: Secondary | ICD-10-CM | POA: Diagnosis not present

## 2021-09-04 DIAGNOSIS — R059 Cough, unspecified: Secondary | ICD-10-CM | POA: Diagnosis not present

## 2021-09-04 DIAGNOSIS — M810 Age-related osteoporosis without current pathological fracture: Secondary | ICD-10-CM | POA: Diagnosis not present

## 2021-09-04 DIAGNOSIS — K219 Gastro-esophageal reflux disease without esophagitis: Secondary | ICD-10-CM | POA: Diagnosis not present

## 2021-09-04 DIAGNOSIS — I10 Essential (primary) hypertension: Secondary | ICD-10-CM

## 2021-09-04 DIAGNOSIS — R052 Subacute cough: Secondary | ICD-10-CM

## 2021-09-04 DIAGNOSIS — N1831 Chronic kidney disease, stage 3a: Secondary | ICD-10-CM

## 2021-09-04 DIAGNOSIS — R0602 Shortness of breath: Secondary | ICD-10-CM | POA: Diagnosis not present

## 2021-09-04 DIAGNOSIS — G43809 Other migraine, not intractable, without status migrainosus: Secondary | ICD-10-CM

## 2021-09-04 DIAGNOSIS — E7849 Other hyperlipidemia: Secondary | ICD-10-CM | POA: Diagnosis not present

## 2021-09-04 LAB — COMPREHENSIVE METABOLIC PANEL
ALT: 11 U/L (ref 0–35)
AST: 18 U/L (ref 0–37)
Albumin: 3.8 g/dL (ref 3.5–5.2)
Alkaline Phosphatase: 91 U/L (ref 39–117)
BUN: 23 mg/dL (ref 6–23)
CO2: 28 mEq/L (ref 19–32)
Calcium: 11.8 mg/dL — ABNORMAL HIGH (ref 8.4–10.5)
Chloride: 106 mEq/L (ref 96–112)
Creatinine, Ser: 1.31 mg/dL — ABNORMAL HIGH (ref 0.40–1.20)
GFR: 37.94 mL/min — ABNORMAL LOW (ref >60.00)
Glucose, Bld: 106 mg/dL — ABNORMAL HIGH (ref 70–99)
Potassium: 4 mEq/L (ref 3.5–5.1)
Sodium: 141 mEq/L (ref 135–145)
Total Bilirubin: 0.3 mg/dL (ref 0.2–1.2)
Total Protein: 7.8 g/dL (ref 6.0–8.3)

## 2021-09-04 LAB — VITAMIN D 25 HYDROXY (VIT D DEFICIENCY, FRACTURES): VITD: 34.1 ng/mL (ref 30.00–100.00)

## 2021-09-04 LAB — CBC WITH DIFFERENTIAL/PLATELET
Basophils Absolute: 0.1 10*3/uL (ref 0.0–0.1)
Basophils Relative: 0.5 % (ref 0.0–3.0)
Eosinophils Absolute: 0.3 10*3/uL (ref 0.0–0.7)
Eosinophils Relative: 2.3 % (ref 0.0–5.0)
HCT: 39.1 % (ref 36.0–46.0)
Hemoglobin: 12.6 g/dL (ref 12.0–15.0)
Lymphocytes Relative: 7.3 % — ABNORMAL LOW (ref 12.0–46.0)
Lymphs Abs: 0.9 10*3/uL (ref 0.7–4.0)
MCHC: 32.2 g/dL (ref 30.0–36.0)
MCV: 95.1 fl (ref 78.0–100.0)
Monocytes Absolute: 0.7 10*3/uL (ref 0.1–1.0)
Monocytes Relative: 5.7 % (ref 3.0–12.0)
Neutro Abs: 10.6 10*3/uL — ABNORMAL HIGH (ref 1.4–7.7)
Neutrophils Relative %: 84.2 % — ABNORMAL HIGH (ref 43.0–77.0)
Platelets: 342 10*3/uL (ref 150.0–400.0)
RBC: 4.11 Mil/uL (ref 3.87–5.11)
RDW: 14.4 % (ref 11.5–15.5)
WBC: 12.6 10*3/uL — ABNORMAL HIGH (ref 4.0–10.5)

## 2021-09-04 LAB — LIPID PANEL
Cholesterol: 149 mg/dL (ref 0–200)
HDL: 60.9 mg/dL (ref >39.00)
LDL Cholesterol: 62 mg/dL (ref 0–99)
NonHDL: 88.46
Total CHOL/HDL Ratio: 2
Triglycerides: 132 mg/dL (ref 0.0–149.0)
VLDL: 26.4 mg/dL (ref 0.0–40.0)

## 2021-09-04 LAB — HEMOGLOBIN A1C: Hgb A1c MFr Bld: 6.1 % (ref 4.6–6.5)

## 2021-09-04 MED ORDER — ESOMEPRAZOLE MAGNESIUM 40 MG PO CPDR: 40.0000 mg | DELAYED_RELEASE_CAPSULE | Freq: Every day | ORAL | 1 refills | Status: DC

## 2021-09-04 MED ORDER — DENOSUMAB 60 MG/ML ~~LOC~~ SOSY
60.0000 mg | PREFILLED_SYRINGE | Freq: Once | SUBCUTANEOUS | Status: AC
  Administered 2021-09-04: 60 mg via SUBCUTANEOUS

## 2021-09-04 NOTE — Assessment & Plan Note (Addendum)
Chronic DEXA up-to-date On Prolia every 6 months-due now -injection given today Unable to exercise Vitamin D level to be checked today She is not taking vitamin D daily - she said someone told her to stop it -- may need to restart it

## 2021-09-04 NOTE — Assessment & Plan Note (Signed)
Chronic Check a1c Low sugar / carb diet Stressed as much activity as tolerated

## 2021-09-04 NOTE — Assessment & Plan Note (Signed)
Chronic Migraines are well controlled Continue topamax 50 gm bid, aimovig 70 mg Q 30 days

## 2021-09-04 NOTE — Assessment & Plan Note (Signed)
Chronic Blood pressure well controlled CMP Continue amlodipine 5 mg daily, Diovan 320 mg daily, nadolol 40 mg twice daily 

## 2021-09-04 NOTE — Assessment & Plan Note (Signed)
Chronic cmp Taking Excedrin prn for neck pain - advised to keep to a minimum and encouraged tylenol

## 2021-09-04 NOTE — Assessment & Plan Note (Signed)
Chronic Has GERD occasionally, but likely has more GERD than she realizes Cough and sputum likely from gerd Start nexium 40 mg daily x 1 month then try QOD Continue famotidine 40 mg daily

## 2021-09-04 NOTE — Assessment & Plan Note (Signed)
Chronic Resolved, but has restarted about 2 months ago ? Related to gerd  - no longer taking nexium Restart nexium 40 mg daily after one month take qod, continue pepcid 40 mg daily cxr today Continue wixela inhaler which helps

## 2021-09-04 NOTE — Assessment & Plan Note (Signed)
Chronic Regular exercise and healthy diet encouraged Check lipid panel  Continue simvastatin 40 mg daily 

## 2021-09-04 NOTE — Assessment & Plan Note (Signed)
Chronic Pth, cmp, vit d level Not taking any calcium or vitamin d at this time - may need to restart vitamin d

## 2021-09-04 NOTE — Assessment & Plan Note (Addendum)
Chronic, but worse now Likely multifactorial Deconditioning is likely contributing GERD may be contributing - restarted nexium ? Cardiac cause -- will refer to cardio for further evaluation - deferred ekg - will let cardio do this Check cbc, cmp today CXR today

## 2021-09-07 LAB — PTH, INTACT AND CALCIUM
Calcium: 12 mg/dL — ABNORMAL HIGH (ref 8.6–10.4)
PTH: 136 pg/mL — ABNORMAL HIGH (ref 16–77)

## 2021-09-07 MED ORDER — CEFPODOXIME PROXETIL 200 MG PO TABS: 200.0000 mg | ORAL_TABLET | Freq: Two times a day (BID) | ORAL | 0 refills | Status: AC

## 2021-09-07 MED ORDER — AZITHROMYCIN 250 MG PO TABS: ORAL_TABLET | ORAL | 0 refills | Status: AC

## 2021-09-07 NOTE — Addendum Note (Signed)
Addended by: [name] on: 09/07/2021 07:29 AM   Modules accepted: Orders

## 2021-10-05 DIAGNOSIS — L821 Other seborrheic keratosis: Secondary | ICD-10-CM | POA: Diagnosis not present

## 2021-10-05 DIAGNOSIS — Z85828 Personal history of other malignant neoplasm of skin: Secondary | ICD-10-CM | POA: Diagnosis not present

## 2021-10-05 DIAGNOSIS — L570 Actinic keratosis: Secondary | ICD-10-CM | POA: Diagnosis not present

## 2021-10-05 DIAGNOSIS — L720 Epidermal cyst: Secondary | ICD-10-CM | POA: Diagnosis not present

## 2021-10-09 ENCOUNTER — Other Ambulatory Visit: Payer: Self-pay | Admitting: Internal Medicine

## 2021-10-10 NOTE — Telephone Encounter (Signed)
Last Prolia inj 09/04/21 Next Prolia inj due 03/06/22

## 2021-10-15 ENCOUNTER — Other Ambulatory Visit: Payer: Self-pay | Admitting: Internal Medicine

## 2021-11-04 ENCOUNTER — Encounter (HOSPITAL_BASED_OUTPATIENT_CLINIC_OR_DEPARTMENT_OTHER): Payer: Self-pay

## 2021-11-18 ENCOUNTER — Ambulatory Visit (HOSPITAL_BASED_OUTPATIENT_CLINIC_OR_DEPARTMENT_OTHER): Payer: Medicare PPO | Admitting: Cardiology

## 2021-11-18 ENCOUNTER — Encounter (HOSPITAL_BASED_OUTPATIENT_CLINIC_OR_DEPARTMENT_OTHER): Payer: Self-pay | Admitting: Cardiology

## 2021-11-18 ENCOUNTER — Other Ambulatory Visit: Payer: Self-pay

## 2021-11-18 VITALS — BP 144/76 | HR 61 | Ht 61.0 in | Wt 114.7 lb

## 2021-11-18 DIAGNOSIS — E785 Hyperlipidemia, unspecified: Secondary | ICD-10-CM | POA: Diagnosis not present

## 2021-11-18 DIAGNOSIS — R0609 Other forms of dyspnea: Secondary | ICD-10-CM

## 2021-11-18 DIAGNOSIS — I10 Essential (primary) hypertension: Secondary | ICD-10-CM

## 2021-11-18 DIAGNOSIS — Z7189 Other specified counseling: Secondary | ICD-10-CM | POA: Diagnosis not present

## 2021-11-18 NOTE — Progress Notes (Incomplete)
Cardiology Office Note:    Date:  11/18/2021   ID:  Joy Martin, DOB [birth year], MRN [id number]  PCP:  [name], MD  Cardiologist:  Buford Dresser, MD  Referring MD: [name], MD   No chief complaint on file.   History of Present Illness:    Joy Martin is a 81 y.o. female with a hx of hypertension, hyperlipidemia, GERD, COPD, pneumonia, CKD, basal cell carcinoma of lip, anemia, and arthritis, who is seen as a new consult at the request of [name], MD for the evaluation and management of dyspnea on exertion.  Ms. Thang saw Dr. Quay Burow 09/04/2021 and reported worsening chronic shortness of breath on exertion. She also noted her previously resolved chronic cough had restarted about 2 months prior. Her symptoms were thought to be multifactorial, with deconditioning and GERD possibly contributing. She was restarted on Nexium. She was referred to cardiology to evaluate for possible cardiac etiology of her shortness of breath.  Cardiovascular risk factors: Prior clinical ASCVD: none that she knows of Comorbid conditions: Hypertension, hyperlipidemia  Metabolic syndrome/Obesity: none Chronic inflammatory conditions: none Tobacco use history: Former smoker, has not smoked since her 74's. Family history: Her mother was diagnosed with mitral valve prolapse. She notes her sister avoids caffeine due to it causing tachycardia. Prior cardiac testing and/or incidental findings on other testing (ie coronary calcium): Heart murmur found as an infant, no surgery required. Exercise level: No formal exercise, severely limited by shortness of breath with minimal exertion.  Current diet: Lately, she reports unintentionally losing weight, which she attributes to not liking the meals cooked at home.  Today, she reports ongoing shortness of breath for years. This mostly occurs while moving around or otherwise exerting. She denies associated chest pain, and she is not able to  feel any palpitations or arrhythmias. She may develop some lightheadedness and flushed feelings. After sitting and resting she generally recovers quickly, about 10 minutes or longer depending on her level of exertion.  She denies any headaches, syncope, orthopnea, PND, or lower extremity edema.  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   removed right upper lip/perinostril [name] ([facility])   Chronic sinusitis    follows with ENT for same   CKD (chronic kidney disease) 2018   COPD (chronic obstructive pulmonary disease) ([place]) dx 2013   GOLD 1, follows with pulm for same   Episodic tension type headache    EXOGENOUS OBESITY    GERD    HYPERLIPIDEMIA    Hypertension    Migraines 07/10/2012   "over; last one was @ age 62"   OSTEOPENIA    Pneumonia 2018-2019   x 2    Past Surgical History:  Procedure Laterality Date   APPENDECTOMY  07/10/2012   BREAST CYST ASPIRATION  ?1980's   right   EYE SURGERY     both eyes   KNEE ARTHROSCOPY  1970's or 1980's   right; torn cartilage   LAPAROSCOPIC APPENDECTOMY  07/10/2012   Procedure: APPENDECTOMY LAPAROSCOPIC;  Surgeon: Harl Bowie, MD;  Location: [facility];  Service: General;  Laterality: N/A;   MOHS SURGERY  01/2016   nose   MOLE REMOVAL  1957   "my back"   SKIN CANCER EXCISION     "2 on my back; 2 on my face"   TONSILLECTOMY  AND ADENOIDECTOMY  1946   WISDOM TOOTH EXTRACTION  1959    Current Medications: Current Outpatient Medications on File Prior to Visit  Medication Sig   amLODipine (NORVASC) 5 MG tablet TAKE 1 TABLET BY MOUTH EVERY DAY   aspirin-acetaminophen-caffeine (EXCEDRIN MIGRAINE) 250-250-65 MG tablet Take 2 tablets by mouth every 6 (six) hours as needed for headache.   Cranberry 500 MG CAPS Take 500 mg by mouth daily.   cycloSPORINE (RESTASIS) 0.05 % ophthalmic emulsion 1 drop 2 (two) times daily.    denosumab (PROLIA) 60 MG/ML SOSY injection Inject 60 mg into the skin every 6 (six) months.   Erenumab-aooe (AIMOVIG) 70 MG/ML SOAJ INJECT 70MG  INTO THE SKIN EVERY 30 DAYS   esomeprazole (NEXIUM) 40 MG capsule Take 1 capsule (40 mg total) by mouth daily at 12 noon.   famotidine (PEPCID) 40 MG tablet TAKE 1 TABLET BY MOUTH EVERY DAY   Flaxseed, Linseed, (FLAXSEED OIL PO) Take 2 tablets by mouth daily.   fluticasone (FLONASE) 50 MCG/ACT nasal spray USE 1 SPRAY IN EACH NOSTRIL DAILY   nadolol (CORGARD) 40 MG tablet TAKE 1 TABLET BY MOUTH TWICE A DAY   simvastatin (ZOCOR) 40 MG tablet TAKE 1 TABLET BY MOUTH EVERYDAY AT BEDTIME   topiramate (TOPAMAX) 50 MG tablet Take 1 tablet (50 mg total) by mouth 2 (two) times daily.   valsartan (DIOVAN) 320 MG tablet TAKE 1 TABLET BY MOUTH EVERY DAY   WIXELA INHUB 100-50 MCG/ACT AEPB TAKE 1 PUFF BY MOUTH TWICE A DAY   Ferrous Sulfate (IRON) 28 MG TABS Take by mouth.   [DISCONTINUED] Calcium Carbonate (CALCIUM 500 PO) Take by mouth.     No current facility-administered medications on file prior to visit.     Allergies:   Allegra [fexofenadine hcl], Prilosec [omeprazole], and Ranitidine   Social History   Tobacco Use   Smoking status: Former    Packs/day: 0.75    Years: 20.00    Pack years: 15.00    Types: Cigarettes    Quit date: 07/25/1982    Years since quitting: 39.3   Smokeless tobacco: Never   Tobacco comments:    07/10/2012 "stopped smoking cigarettes 1980's"  Substance Use Topics   Alcohol use: Yes    Comment: 07/10/2012 "glass of wine 1-2X/yr"   Drug use: No    Family History: family history includes Alcohol abuse in an other family member; Cancer in her father, maternal grandfather, and mother; Diabetes in an other family member; Heart disease in an other family member; Hyperparathyroidism in her mother and sister; Hypertension in an other family member; Pancreatic cancer in an other family member; Prostate cancer in her father.  ROS:    Please see the history of present illness.  Additional pertinent ROS: Constitutional: Negative for chills, fever, or night sweats. Positive for feeling flushed, rare night sweats (1-2x per month), unintentional weight loss.   HENT: Negative for ear pain and hearing loss.   Eyes: Negative for loss of vision and eye pain.  Respiratory: Negative for cough, wheezing. Positive for shortness of breath, sputum.  Cardiovascular: See HPI. Gastrointestinal: Negative for abdominal pain, melena, and hematochezia.  Genitourinary: Negative for dysuria and hematuria.  Musculoskeletal: Negative for falls and myalgias.  Skin: Negative for itching and rash.  Neurological: Negative for focal weakness, focal sensory changes and loss of consciousness.  Endo/Heme/Allergies: Does bruise/bleed easily. No recent bleeding issues.     EKGs/Labs/Other Studies Reviewed:    The following studies were reviewed today:  No  prior cardiovascular studies available.   EKG:  EKG is personally reviewed.   11/18/2021: ***  Recent Labs: 12/05/2020: TSH 2.26 09/04/2021: ALT 11; BUN 23; Creatinine, Ser 1.31; Hemoglobin 12.6; Platelets 342.0; Potassium 4.0; Sodium 141   Recent Lipid Panel    Component Value Date/Time   CHOL 149 09/04/2021 1043   TRIG 132.0 09/04/2021 1043   HDL 60.90 09/04/2021 1043   CHOLHDL 2 09/04/2021 1043   VLDL 26.4 09/04/2021 1043   LDLCALC 62 09/04/2021 1043   LDLCALC 81 06/03/2020 1153   LDLDIRECT 143.2 12/19/2007 1018    Physical Exam:    VS:  BP (!) 144/76 (BP Location: Left Arm, Patient Position: Sitting, Cuff Size: Small)    Pulse 61    Ht 5\' 1"  (1.549 m)    Wt 114 lb 11.2 oz (52 kg)    SpO2 99%    BMI 21.67 kg/m     Wt Readings from Last 3 Encounters:  11/18/21 114 lb 11.2 oz (52 kg)  09/04/21 115 lb (52.2 kg)  03/05/21 117 lb (53.1 kg)    GEN: Well nourished, well developed in no acute distress HEENT: Normal, moist mucous membranes NECK: No JVD CARDIAC: regular rhythm,  normal S1 and S2, no rubs or gallops. No murmur. VASCULAR: Radial and DP pulses 2+ bilaterally. No carotid bruits RESPIRATORY:  Clear to auscultation without rales, wheezing or rhonchi  ABDOMEN: Soft, non-tender, non-distended MUSCULOSKELETAL:  Ambulates independently SKIN: Warm and dry, no edema NEUROLOGIC:  Alert and oriented x 3. No focal neuro deficits noted. PSYCHIATRIC:  Normal affect    ASSESSMENT:    No diagnosis found. PLAN:     Cardiac risk counseling and prevention recommendations: -recommend heart healthy/Mediterranean diet, with whole grains, fruits, vegetable, fish, lean meats, nuts, and olive oil. Limit salt. -recommend moderate walking, 3-5 times/week for 30-50 minutes each session. Aim for at least 150 minutes.week. Goal should be pace of 3 miles/hours, or walking 1.5 miles in 30 minutes -recommend avoidance of tobacco products. Avoid excess alcohol. -ASCVD risk score: The ASCVD Risk score (Arnett DK, et al., 2019) failed to calculate for the following reasons:   The 2019 ASCVD risk score is only valid for ages 61 to 67    Plan for follow up: TBD based on results of testing.  Buford Dresser, MD, PhD, [facility]    Medication Adjustments/Labs and Tests Ordered: Current medicines are reviewed at length with the patient today.  Concerns regarding medicines are outlined above.   Orders Placed This Encounter  Procedures   EKG 12-Lead   ECHOCARDIOGRAM COMPLETE   No orders of the defined types were placed in this encounter.  Patient Instructions  Medication Instructions:  Your Physician recommend you continue on your current medication as directed.    *If you need a refill on your cardiac medications before your next appointment, please call your pharmacy*   Lab Work: None ordered today   Testing/Procedures: Your physician has requested that you have an echocardiogram. Echocardiography is a painless test that uses sound waves  to create images of your heart. It provides your doctor with information about the size and shape of your heart and how well your hearts chambers and valves are working. This procedure takes approximately one hour. There are no restrictions for this procedure. [place], you and your health needs are our priority.  As part of our continuing mission to provide you with  exceptional heart care, we have created designated Provider Care Teams.  These Care Teams include your primary Cardiologist (physician) and Advanced Practice Providers (APPs -  Physician Assistants and Nurse Practitioners) who all work together to provide you with the care you need, when you need it.  We recommend signing up for the patient portal called "MyChart".  Sign up information is provided on this After Visit Summary.  MyChart is used to connect with patients for Virtual Visits (Telemedicine).  Patients are able to view lab/test results, encounter notes, upcoming appointments, etc.  Non-urgent messages can be sent to your provider as well.   To learn more about what you can do with MyChart, go to [email or web address].    Your next appointment:   Based on test results  The format for your next appointment:   In Person  Provider:   Buford Dresser, MD        [facility],acting as a scribe for Buford Dresser, MD.,have documented all relevant documentation on the behalf of Buford Dresser, MD,as directed by  Buford Dresser, MD while in the presence of Buford Dresser, MD.  I, Buford Dresser, MD, have reviewed all documentation for this visit. The documentation on 11/27/21 for the exam, diagnosis, procedures, and orders are all accurate and complete.   Signed, Buford Dresser, MD PhD 11/18/2021 11:04 AM    [place]

## 2021-11-18 NOTE — Patient Instructions (Signed)
Medication Instructions:  Your Physician recommend you continue on your current medication as directed.    *If you need a refill on your cardiac medications before your next appointment, please call your pharmacy*   Lab Work: None ordered today   Testing/Procedures: Your physician has requested that you have an echocardiogram. Echocardiography is a painless test that uses sound waves to create images of your heart. It provides your doctor with information about the size and shape of your heart and how well your hearts chambers and valves are working. This procedure takes approximately one hour. There are no restrictions for this procedure. [place], you and your health needs are our priority.  As part of our continuing mission to provide you with exceptional heart care, we have created designated Provider Care Teams.  These Care Teams include your primary Cardiologist (physician) and Advanced Practice Providers (APPs -  Physician Assistants and Nurse Practitioners) who all work together to provide you with the care you need, when you need it.  We recommend signing up for the patient portal called "MyChart".  Sign up information is provided on this After Visit Summary.  MyChart is used to connect with patients for Virtual Visits (Telemedicine).  Patients are able to view lab/test results, encounter notes, upcoming appointments, etc.  Non-urgent messages can be sent to your provider as well.   To learn more about what you can do with MyChart, go to [email or web address].    Your next appointment:   Based on test results  The format for your next appointment:   In Person  Provider:   Buford Dresser, MD

## 2021-11-26 ENCOUNTER — Ambulatory Visit (INDEPENDENT_AMBULATORY_CARE_PROVIDER_SITE_OTHER): Payer: Medicare PPO

## 2021-11-26 ENCOUNTER — Other Ambulatory Visit: Payer: Self-pay

## 2021-11-26 DIAGNOSIS — R0609 Other forms of dyspnea: Secondary | ICD-10-CM | POA: Diagnosis not present

## 2021-11-26 LAB — ECHOCARDIOGRAM COMPLETE
AR max vel: 2.51 cm2
AV Area VTI: 2.76 cm2
AV Area mean vel: 2.65 cm2
AV Mean grad: 2 mmHg
AV Peak grad: 6.2 mmHg
AV Vena cont: 0.11 cm
Ao pk vel: 1.24 m/s
Area-P 1/2: 3.12 cm2
Calc EF: 64.1 %
S' Lateral: 2.7 cm
Single Plane A2C EF: 62.7 %
Single Plane A4C EF: 66.9 %

## 2021-11-27 ENCOUNTER — Encounter: Payer: Self-pay | Admitting: Internal Medicine

## 2021-11-27 ENCOUNTER — Telehealth: Payer: Self-pay | Admitting: Cardiology

## 2021-11-27 NOTE — Telephone Encounter (Signed)
° °  Pt is returning call to get echo result °

## 2021-11-27 NOTE — Telephone Encounter (Signed)
Returned patient call to discuss results, she is agreeable to meeting with Dr. Harrell Gave to talk about a stress test. Patient is scheduled for 2/6 at 9am   "Echo reassuring. Squeeze of the heart is normal. Mild stiffness, normal for age. No significant valve disease and normal pressure in the heart. Suggests shortness of breath is not due to a structural problem with the heart. Next option would be to discuss something like a stress test. If she would be interested in discussing a stress test, please have her come in for an appointment. "

## 2021-12-03 ENCOUNTER — Ambulatory Visit (INDEPENDENT_AMBULATORY_CARE_PROVIDER_SITE_OTHER): Payer: Medicare PPO

## 2021-12-03 DIAGNOSIS — R052 Subacute cough: Secondary | ICD-10-CM | POA: Diagnosis not present

## 2021-12-03 DIAGNOSIS — J189 Pneumonia, unspecified organism: Secondary | ICD-10-CM | POA: Diagnosis not present

## 2021-12-03 DIAGNOSIS — R0602 Shortness of breath: Secondary | ICD-10-CM

## 2021-12-03 DIAGNOSIS — J439 Emphysema, unspecified: Secondary | ICD-10-CM | POA: Diagnosis not present

## 2021-12-03 DIAGNOSIS — R9389 Abnormal findings on diagnostic imaging of other specified body structures: Secondary | ICD-10-CM

## 2021-12-03 DIAGNOSIS — K449 Diaphragmatic hernia without obstruction or gangrene: Secondary | ICD-10-CM | POA: Diagnosis not present

## 2021-12-11 NOTE — Progress Notes (Signed)
Cardiology Office Note:    Date:  12/14/2021   ID:  Joy Martin, DOB [birth year], MRN [id number]  PCP:  Joy Rail, MD  Cardiologist:  Buford Dresser, MD  Referring MD: Joy Rail, MD   CC: follow up  History of Present Illness:    Joy Martin is a 81 y.o. female with a hx of hypertension, hyperlipidemia, GERD, COPD, pneumonia, CKD, basal cell carcinoma of lip, anemia, and arthritis, who is seen for follow-up. I initially met her 11/18/2021 as a new consult at the request of Burns, Joy Lick, MD for the evaluation and management of dyspnea on exertion.  Cardiac history: Ms. Sheeran saw Dr. Quay Burow 09/04/2021 and reported worsening chronic shortness of breath on exertion. She also noted her previously resolved chronic cough had restarted about 2 months prior. Her symptoms were thought to be multifactorial, with deconditioning and GERD possibly contributing. She was restarted on Nexium. She was referred to cardiology to evaluate for possible cardiac etiology of her shortness of breath.  Cardiovascular risk factors: Tobacco use history: Former smoker, has not smoked since her 22's. Family history: Her mother was diagnosed with mitral valve prolapse. She notes her sister avoids caffeine due to it causing tachycardia. Exercise level: No formal exercise, severely limited by shortness of breath with minimal exertion. This has been ongoing for years. After sitting and resting she generally recovers quickly, about 10 minutes or longer depending on her level of exertion. Current diet: Lately, she reports unintentionally losing weight, which she attributes to not liking the meals cooked at home.  Today: Overall, she is feeling about the same since her last appointment. She is also here to discuss possible risks of a stress test which concerns her. We reviewed the results of her echo today.  Regarding her shortness of breath, she feels the most limited with exertion.   Every once  in a while she will notice mild edema in her dorsal feet.   She denies any palpitations, or chest pain. No lightheadedness, headaches, syncope, orthopnea, PND.   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   removed right upper lip/perinostril Joy Martin ([facility])   Chronic sinusitis    follows with ENT for same   CKD (chronic kidney disease) 2018   COPD (chronic obstructive pulmonary disease) ([place]) dx 2013   GOLD 1, follows with pulm for same   Episodic tension type headache    EXOGENOUS OBESITY    GERD    HYPERLIPIDEMIA    Hypertension    Migraines 07/10/2012   "over; last one was @ age 87"   OSTEOPENIA    Pneumonia 2018-2019   x 2    Past Surgical History:  Procedure Laterality Date   APPENDECTOMY  07/10/2012   BREAST CYST ASPIRATION  ?1980's   right   EYE SURGERY     both eyes   KNEE ARTHROSCOPY  1970's or 1980's   right; torn cartilage   LAPAROSCOPIC APPENDECTOMY  07/10/2012   Procedure: APPENDECTOMY LAPAROSCOPIC;  Surgeon: Joy Bowie, MD;  Location: [facility];  Service: General;  Laterality: N/A;   MOHS SURGERY  01/2016   nose   MOLE REMOVAL  1957   "my back"   SKIN CANCER EXCISION     "2 on my back; 2 on my face"   [place]  WISDOM TOOTH EXTRACTION  1959    Current Medications: Current Outpatient Medications on File Prior to Visit  Medication Sig   amLODipine (NORVASC) 5 MG tablet TAKE 1 TABLET BY MOUTH EVERY DAY   aspirin-acetaminophen-caffeine (EXCEDRIN MIGRAINE) 250-250-65 MG tablet Take 2 tablets by mouth every 6 (six) hours as needed for headache.   Cranberry 500 MG CAPS Take 500 mg by mouth daily.   cycloSPORINE (RESTASIS) 0.05 % ophthalmic emulsion 1 drop 2 (two) times daily.   denosumab (PROLIA) 60 MG/ML SOSY injection Inject 60 mg into the skin every 6 (six) months.   Erenumab-aooe (AIMOVIG) 70 MG/ML SOAJ  INJECT 70MG INTO THE SKIN EVERY 30 DAYS   esomeprazole (NEXIUM) 40 MG capsule Take 1 capsule (40 mg total) by mouth daily at 12 noon.   famotidine (PEPCID) 40 MG tablet TAKE 1 TABLET BY MOUTH EVERY DAY   Flaxseed, Linseed, (FLAXSEED OIL PO) Take 2 tablets by mouth daily.   fluticasone (FLONASE) 50 MCG/ACT nasal spray USE 1 SPRAY IN EACH NOSTRIL DAILY   nadolol (CORGARD) 40 MG tablet TAKE 1 TABLET BY MOUTH TWICE A DAY   simvastatin (ZOCOR) 40 MG tablet TAKE 1 TABLET BY MOUTH EVERYDAY AT BEDTIME   topiramate (TOPAMAX) 50 MG tablet Take 1 tablet (50 mg total) by mouth 2 (two) times daily.   valsartan (DIOVAN) 320 MG tablet TAKE 1 TABLET BY MOUTH EVERY DAY   WIXELA INHUB 100-50 MCG/ACT AEPB TAKE 1 PUFF BY MOUTH TWICE A DAY   [DISCONTINUED] Calcium Carbonate (CALCIUM 500 PO) Take by mouth.     No current facility-administered medications on file prior to visit.     Allergies:   Allegra [fexofenadine hcl], Prilosec [omeprazole], and Ranitidine   Social History   Tobacco Use   Smoking status: Former    Packs/day: 0.75    Years: 20.00    Pack years: 15.00    Types: Cigarettes    Quit date: 07/25/1982    Years since quitting: 39.4   Smokeless tobacco: Never   Tobacco comments:    07/10/2012 "stopped smoking cigarettes 1980's"  Substance Use Topics   Alcohol use: Yes    Comment: 07/10/2012 "glass of wine 1-2X/yr"   Drug use: No    Family History: family history includes Alcohol abuse in an other family member; Cancer in her father, maternal grandfather, and mother; Diabetes in an other family member; Heart disease in an other family member; Hyperparathyroidism in her mother and sister; Hypertension in an other family member; Pancreatic cancer in an other family member; Prostate cancer in her father.  ROS:   Please see the history of present illness. (+) Shortness of breath (+) LE edema to dorsal feet All other systems are reviewed and negative.    EKGs/Labs/Other Studies Reviewed:     The following studies were reviewed today:  Echo 11/26/2021: Sonographer Comments: Image acquisition challenging due to patient body habitus and severe kyphosis.  IMPRESSIONS    1. Left ventricular ejection fraction, by estimation, is 60 to 65%. The  left ventricle has normal function. The left ventricle has no regional  wall motion abnormalities. Left ventricular diastolic parameters are  consistent with Grade I diastolic  dysfunction (impaired relaxation).   2. Right ventricular systolic function is normal. The right ventricular  size is normal. There is normal pulmonary artery systolic pressure.   3. Left atrial size was moderately dilated.   4. The mitral valve is grossly normal. No evidence of mitral valve  regurgitation.   5. The  aortic valve is grossly normal. Aortic valve regurgitation is not  visualized.   6. The inferior vena cava is normal in size with greater than 50%  respiratory variability, suggesting right atrial pressure of 3 mmHg.   Comparison(s): No prior Echocardiogram.    EKG:  EKG is personally reviewed.   12/14/2021: EKG was not ordered. 11/18/2021: NSR at 61 bpm  Recent Labs: 09/04/2021: ALT 11; BUN 23; Creatinine, Ser 1.31; Hemoglobin 12.6; Platelets 342.0; Potassium 4.0; Sodium 141   Recent Lipid Panel    Component Value Date/Time   CHOL 149 09/04/2021 1043   TRIG 132.0 09/04/2021 1043   HDL 60.90 09/04/2021 1043   CHOLHDL 2 09/04/2021 1043   VLDL 26.4 09/04/2021 1043   LDLCALC 62 09/04/2021 1043   LDLCALC 81 06/03/2020 1153   LDLDIRECT 143.2 12/19/2007 1018    Physical Exam:    VS:  BP 138/88    Pulse 60    Ht 5' 1"  (1.549 m)    Wt 117 lb 1.6 oz (53.1 kg)    SpO2 96%    BMI 22.13 kg/m     Wt Readings from Last 3 Encounters:  12/14/21 117 lb 1.6 oz (53.1 kg)  11/18/21 114 lb 11.2 oz (52 kg)  09/04/21 115 lb (52.2 kg)    GEN: Well nourished, well developed in no acute distress HEENT: Normal, moist mucous membranes NECK: No JVD CARDIAC:  regular rhythm, normal S1 and S2, no rubs or gallops. No murmur. VASCULAR: Radial and DP pulses 2+ bilaterally. No carotid bruits RESPIRATORY:  Clear to auscultation without rales, wheezing or rhonchi  ABDOMEN: Soft, non-tender, non-distended MUSCULOSKELETAL:  Ambulates independently SKIN: Warm and dry, no edema NEUROLOGIC:  Alert and oriented x 3. No focal neuro deficits noted. PSYCHIATRIC:  Normal affect    ASSESSMENT:    1. Dyspnea on exertion   2. Essential hypertension   3. Hyperlipidemia, unspecified hyperlipidemia type   4. Cardiac risk counseling    PLAN:    Dyspnea on exertion -echo unremarkable -discussed further workup today. Based on her limited mobility and comorbid conditions, we will pursue lexiscan myoview stress test Shared Decision Making/Informed Consent The risks [chest pain, shortness of breath, cardiac arrhythmias, dizziness, blood pressure fluctuations, myocardial infarction, stroke/transient ischemic attack, nausea, vomiting, allergic reaction, radiation exposure, metallic taste sensation and life-threatening complications (estimated to be 1 in 10,000)], benefits (risk stratification, diagnosing coronary artery disease, treatment guidance) and alternatives of a nuclear stress test were discussed in detail with Ms. Walrond and she agrees to proceed.   Hypertension: -mildly elevated today but has been well controlled -continue amlodipine 5 mg, nadolol 40 mg BID, valsartan 320 mg daily.  Hyperlipidemia: -tolerating simvastatin, will continue -las LDL 62 per [facility] 09/04/21  Cardiac risk counseling and prevention recommendations: -recommend heart healthy/Mediterranean diet, with whole grains, fruits, vegetable, fish, lean meats, nuts, and olive oil. Limit salt. -recommend moderate walking, 3-5 times/week for 30-50 minutes each session. Aim for at least 150 minutes.week. Goal should be pace of 3 miles/hours, or walking 1.5 miles in 30 minutes -recommend avoidance of  tobacco products. Avoid excess alcohol. -ASCVD risk score: The ASCVD Risk score (Arnett DK, et al., 2019) failed to calculate for the following reasons:   The 2019 ASCVD risk score is only valid for ages 23 to 25    Plan for follow up: to be determine based on results of stress test.  Buford Dresser, MD, PhD, [facility]    Medication Adjustments/Labs and  Tests Ordered: Current medicines are reviewed at length with the patient today.  Concerns regarding medicines are outlined above.   Orders Placed This Encounter  Procedures   MYOCARDIAL PERFUSION IMAGING   No orders of the defined types were placed in this encounter.  Patient Instructions  Medication Instructions:  Your Physician recommend you continue on your current medication as directed.    *If you need a refill on your cardiac medications before your next appointment, please call your pharmacy*   Lab Work: None ordered today   Testing/Procedures: Your physician has requested that you have a lexiscan myoview. For further information please visit [email or web address]. Please follow instruction sheet, as given.  [place]  Follow-Up: At [facility], you and your health needs are our priority.  As part of our continuing mission to provide you with exceptional heart care, we have created designated Provider Care Teams.  These Care Teams include your primary Cardiologist (physician) and Advanced Practice Providers (APPs -  Physician Assistants and Nurse Practitioners) who all work together to provide you with the care you need, when you need it.  We recommend signing up for the patient portal called "MyChart".  Sign up information is provided on this After Visit Summary.  MyChart is used to connect with patients for Virtual Visits (Telemedicine).  Patients are able to view lab/test results, encounter notes, upcoming appointments, etc.  Non-urgent messages can be sent to your  provider as well.   To learn more about what you can do with MyChart, go to [email or web address].    Your next appointment:   Based on test results  The format for your next appointment:   In Person  Provider:   Buford Dresser, MD   You are scheduled for a Myocardial Perfusion Imaging Study.  Please arrive 15 minutes prior to your appointment time for registration and insurance purposes.  The test will take approximately 3 to 4 hours to complete; you may bring reading material.  If someone comes with you to your appointment, they will need to remain in the main lobby due to limited space in the testing area. **If you are pregnant or breastfeeding, please notify the nuclear lab prior to your appointment**  How to prepare for your Myocardial Perfusion Test: Do not eat or drink 3 hours prior to your test, except you may have water. Do not consume products containing caffeine (regular or decaffeinated) 12 hours prior to your test. (ex: coffee, chocolate, sodas, tea). Do bring a list of your current medications with you.  If not listed below, you may take your medications as normal. Do wear comfortable clothes (no dresses or overalls) and walking shoes, tennis shoes preferred (No heels or open toe shoes are allowed). Do NOT wear cologne, perfume, aftershave, or lotions (deodorant is allowed). If these instructions are not followed, your test will have to be rescheduled.  Please report to [place], [address] for your test.  If you have questions or concerns about your appointment, you can call the Nuclear Lab at [phone].  If you cannot keep your appointment, please provide 24 hours notification to the Nuclear Lab, to avoid a possible $50 charge to your account.     I,Mathew Stumpf,acting as a Education administrator for [employer], MD.,have documented all relevant documentation on the behalf of Buford Dresser, MD,as directed by  Buford Dresser, MD while in  the presence of Buford Dresser, MD.  I, Buford Dresser, MD, have reviewed all documentation for this visit. The  documentation on 12/14/21 for the exam, diagnosis, procedures, and orders are all accurate and complete.   Signed, Buford Dresser, MD PhD 12/14/2021 12:44 PM    [place]

## 2021-12-14 ENCOUNTER — Encounter (HOSPITAL_BASED_OUTPATIENT_CLINIC_OR_DEPARTMENT_OTHER): Payer: Self-pay | Admitting: Cardiology

## 2021-12-14 ENCOUNTER — Ambulatory Visit (HOSPITAL_BASED_OUTPATIENT_CLINIC_OR_DEPARTMENT_OTHER): Payer: Medicare PPO | Admitting: Cardiology

## 2021-12-14 ENCOUNTER — Other Ambulatory Visit: Payer: Self-pay

## 2021-12-14 VITALS — BP 138/88 | HR 60 | Ht 61.0 in | Wt 117.1 lb

## 2021-12-14 DIAGNOSIS — Z7189 Other specified counseling: Secondary | ICD-10-CM

## 2021-12-14 DIAGNOSIS — E785 Hyperlipidemia, unspecified: Secondary | ICD-10-CM | POA: Diagnosis not present

## 2021-12-14 DIAGNOSIS — I10 Essential (primary) hypertension: Secondary | ICD-10-CM | POA: Diagnosis not present

## 2021-12-14 DIAGNOSIS — R0609 Other forms of dyspnea: Secondary | ICD-10-CM

## 2021-12-14 NOTE — Patient Instructions (Signed)
Medication Instructions:  Your Physician recommend you continue on your current medication as directed.    *If you need a refill on your cardiac medications before your next appointment, please call your pharmacy*   Lab Work: None ordered today   Testing/Procedures: Your physician has requested that you have a lexiscan myoview. For further information please visit [email or web address]. Please follow instruction sheet, as given.  [place]. [address]  Follow-Up: At [facility], you and your health needs are our priority.  As part of our continuing mission to provide you with exceptional heart care, we have created designated Provider Care Teams.  These Care Teams include your primary Cardiologist (physician) and Advanced Practice Providers (APPs -  Physician Assistants and Nurse Practitioners) who all work together to provide you with the care you need, when you need it.  We recommend signing up for the patient portal called "MyChart".  Sign up information is provided on this After Visit Summary.  MyChart is used to connect with patients for Virtual Visits (Telemedicine).  Patients are able to view lab/test results, encounter notes, upcoming appointments, etc.  Non-urgent messages can be sent to your provider as well.   To learn more about what you can do with MyChart, go to [email or web address].    Your next appointment:   Based on test results  The format for your next appointment:   In Person  Provider:   Buford Dresser, MD   You are scheduled for a Myocardial Perfusion Imaging Study.  Please arrive 15 minutes prior to your appointment time for registration and insurance purposes.  The test will take approximately 3 to 4 hours to complete; you may bring reading material.  If someone comes with you to your appointment, they will need to remain in the main lobby due to limited space in the testing area. **If you are pregnant or breastfeeding, please notify the  nuclear lab prior to your appointment**  How to prepare for your Myocardial Perfusion Test: Do not eat or drink 3 hours prior to your test, except you may have water. Do not consume products containing caffeine (regular or decaffeinated) 12 hours prior to your test. (ex: coffee, chocolate, sodas, tea). Do bring a list of your current medications with you.  If not listed below, you may take your medications as normal. Do wear comfortable clothes (no dresses or overalls) and walking shoes, tennis shoes preferred (No heels or open toe shoes are allowed). Do NOT wear cologne, perfume, aftershave, or lotions (deodorant is allowed). If these instructions are not followed, your test will have to be rescheduled.  Please report to [place], [address] for your test.  If you have questions or concerns about your appointment, you can call the Nuclear Lab at [phone].  If you cannot keep your appointment, please provide 24 hours notification to the Nuclear Lab, to avoid a possible $50 charge to your account.

## 2021-12-16 ENCOUNTER — Other Ambulatory Visit: Payer: Self-pay | Admitting: Internal Medicine

## 2021-12-18 ENCOUNTER — Telehealth (HOSPITAL_COMMUNITY): Payer: Self-pay | Admitting: *Deleted

## 2021-12-18 NOTE — Telephone Encounter (Signed)
Close encounter 

## 2021-12-23 ENCOUNTER — Ambulatory Visit (HOSPITAL_COMMUNITY)
Admission: RE | Admit: 2021-12-23 | Discharge: 2021-12-23 | Disposition: A | Discharge status: Home / Self Care | Payer: Medicare PPO | Source: Ambulatory Visit | Attending: Cardiology | Admitting: Cardiology

## 2021-12-23 ENCOUNTER — Other Ambulatory Visit: Payer: Self-pay

## 2021-12-23 DIAGNOSIS — R0609 Other forms of dyspnea: Secondary | ICD-10-CM | POA: Insufficient documentation

## 2021-12-23 LAB — MYOCARDIAL PERFUSION IMAGING
LV dias vol: 67 mL (ref 46–106)
LV sys vol: 16 mL
Nuc Stress EF: 76 %
Peak HR: 91 {beats}/min
Rest HR: 60 {beats}/min
Rest Nuclear Isotope Dose: 10.9 mCi
SDS: 7
SRS: 0
SSS: 7
ST Depression (mm): 0 mm
Stress Nuclear Isotope Dose: 31.8 mCi
TID: 0.76

## 2021-12-23 MED ORDER — TECHNETIUM TC 99M TETROFOSMIN IV KIT
10.9000 | PACK | Freq: Once | INTRAVENOUS | Status: AC | PRN
  Administered 2021-12-23: 10.9 via INTRAVENOUS
  Filled 2021-12-23: qty 11

## 2021-12-23 MED ORDER — REGADENOSON 0.4 MG/5ML IV SOLN
0.4000 mg | Freq: Once | INTRAVENOUS | Status: AC
  Administered 2021-12-23: 0.4 mg via INTRAVENOUS

## 2021-12-23 MED ORDER — TECHNETIUM TC 99M TETROFOSMIN IV KIT
31.8000 | PACK | Freq: Once | INTRAVENOUS | Status: AC | PRN
  Administered 2021-12-23: 31.8 via INTRAVENOUS
  Filled 2021-12-23: qty 32

## 2021-12-24 ENCOUNTER — Other Ambulatory Visit: Payer: Self-pay | Admitting: Internal Medicine

## 2021-12-26 ENCOUNTER — Other Ambulatory Visit: Payer: Self-pay | Admitting: Internal Medicine

## 2021-12-27 ENCOUNTER — Encounter: Payer: Self-pay | Admitting: Internal Medicine

## 2021-12-27 NOTE — Patient Instructions (Addendum)
° ° °  Call [facility] at Phone: [phone]   Have blood work done today.    Medications changes include :   none     Return in about 6 months (around 06/30/2022) for CPE.

## 2021-12-27 NOTE — Progress Notes (Signed)
Subjective:    Patient ID: Joy Martin, female    DOB: 02-23-1941, 81 y.o.   MRN: [id number]  This visit occurred during the SARS-CoV-2 public health emergency.  Safety protocols were in place, including screening questions prior to the visit, additional usage of staff PPE, and extensive cleaning of exam room while observing appropriate contact time as indicated for disinfecting solutions.     HPI The patient is here for follow up of their chronic medical problems, including htn, hld, prediabetes, CKD, GERD, weight loss, migraines, posterior HA from neck  SOB - referred to cardio - echo unremarkable.  Stress test w/o evidence of blockages.  Not cardiac in nature.   The SOB may be a little bit better.  The inhaler may be helping.  She took the antibiotics for the PNA and those may have helped.   I did refer her to pulmonary after her xray on 1/26 which showed chronic changes, ? Atypical infection, COPD.  These were present years ago.    Had episode of severe neck pain x 4 days.  She had the prolia injection just before that, but had not had any side effects previously.    Medications and allergies reviewed with patient and updated if appropriate.  Patient Active Problem List   Diagnosis Date Noted   SOB (shortness of breath) on exertion 09/04/2021   Cervicogenic headache 03/05/2021   Protein-calorie malnutrition, moderate ([place]) 12/05/2020   Weight loss 06/03/2020   Hyperparathyroidism, primary ([place]) 10/29/2018   Neuralgia 01/16/2018   Neck pain 01/16/2018   Prediabetes 07/27/2017   Cough 07/27/2017   Kyphosis 05/04/2017   Chronic shoulder bursitis, left 02/15/2017   AC (acromioclavicular) arthritis 02/15/2017   Migraine headache 01/26/2017   Back pain 01/26/2017   Chronic kidney disease 01/26/2017   Hypertensive retinopathy 11/29/2016   Osteoarthritis of right knee 07/28/2016   Basal cell carcinoma of skin 11/26/2015   Abnormal CXR 01/27/2013   Essential  hypertension, benign 12/24/2008   ALLERGIC RHINITIS, CHRONIC 12/24/2008   Hyperlipidemia 12/19/2007   ANEMIA 12/19/2007   GERD 12/19/2007   Osteoporosis 12/19/2007    Current Outpatient Medications on File Prior to Visit  Medication Sig Dispense Refill   amLODipine (NORVASC) 5 MG tablet TAKE 1 TABLET BY MOUTH EVERY DAY 90 tablet 0   aspirin-acetaminophen-caffeine (EXCEDRIN MIGRAINE) 250-250-65 MG tablet Take 2 tablets by mouth every 6 (six) hours as needed for headache.     Cranberry 500 MG CAPS Take 500 mg by mouth daily.     cycloSPORINE (RESTASIS) 0.05 % ophthalmic emulsion 1 drop 2 (two) times daily.     denosumab (PROLIA) 60 MG/ML SOSY injection Inject 60 mg into the skin every 6 (six) months.     Erenumab-aooe (AIMOVIG) 70 MG/ML SOAJ INJECT 70MG  INTO THE SKIN EVERY 30 DAYS 3 mL 3   esomeprazole (NEXIUM) 40 MG capsule Take 1 capsule (40 mg total) by mouth daily at 12 noon. 90 capsule 1   famotidine (PEPCID) 40 MG tablet TAKE 1 TABLET BY MOUTH EVERY DAY 90 tablet 1   Flaxseed, Linseed, (FLAXSEED OIL PO) Take 2 tablets by mouth daily.     fluticasone (FLONASE) 50 MCG/ACT nasal spray USE 1 SPRAY IN EACH NOSTRIL DAILY 48 mL 1   nadolol (CORGARD) 40 MG tablet TAKE 1 TABLET BY MOUTH TWICE A DAY 180 tablet 1   simvastatin (ZOCOR) 40 MG tablet TAKE 1 TABLET BY MOUTH EVERYDAY AT BEDTIME 90 tablet 0   topiramate (TOPAMAX)  50 MG tablet Take 1 tablet (50 mg total) by mouth 2 (two) times daily. 180 tablet 3   valsartan (DIOVAN) 320 MG tablet TAKE 1 TABLET BY MOUTH EVERY DAY 90 tablet 1   WIXELA INHUB 100-50 MCG/ACT AEPB INHALE 1 PUFF BY MOUTH TWICE A DAY 60 each 5   [DISCONTINUED] Calcium Carbonate (CALCIUM 500 PO) Take by mouth.       No current facility-administered medications on file prior to visit.    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   removed right upper  lip/perinostril [name] ([facility])   Chronic sinusitis    follows with ENT for same   CKD (chronic kidney disease) 2018   COPD (chronic obstructive pulmonary disease) ([place]) dx 2013   GOLD 1, follows with pulm for same   Episodic tension type headache    EXOGENOUS OBESITY    GERD    HYPERLIPIDEMIA    Hypertension    Migraines 07/10/2012   "over; last one was @ age 42"   OSTEOPENIA    Pneumonia 2018-2019   x 2    Past Surgical History:  Procedure Laterality Date   APPENDECTOMY  07/10/2012   BREAST CYST ASPIRATION  ?1980's   right   EYE SURGERY     both eyes   KNEE ARTHROSCOPY  1970's or 1980's   right; torn cartilage   LAPAROSCOPIC APPENDECTOMY  07/10/2012   Procedure: APPENDECTOMY LAPAROSCOPIC;  Surgeon: Harl Bowie, MD;  Location: [facility];  Service: General;  Laterality: N/A;   MOHS SURGERY  01/2016   nose   MOLE REMOVAL  1957   "my back"   SKIN CANCER EXCISION     "2 on my back; 2 on my face"   [place]    Social History   Socioeconomic History   Marital status: Single    Spouse name: Not on file   Number of children: 0   Years of education: 16   Highest education level: Not on file  Occupational History    Comment: retired Pharmacist, hospital, 5th grade  Tobacco Use   Smoking status: Former    Packs/day: 0.75    Years: 20.00    Pack years: 15.00    Types: Cigarettes    Quit date: 07/25/1982    Years since quitting: 39.4   Smokeless tobacco: Never   Tobacco comments:    07/10/2012 "stopped smoking cigarettes 1980's"  Substance and Sexual Activity   Alcohol use: Yes    Comment: 07/10/2012 "glass of wine 1-2X/yr"   Drug use: No   Sexual activity: Never  Other Topics Concern   Not on file  Social History Narrative   Retired Pharmacist, hospital, 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   Caffeine- coffee 1 cup, some Coke   Social Determinants of Radio broadcast assistant Strain: Low Risk    Difficulty of Paying Living Expenses: Not hard at  all  Food Insecurity: No Food Insecurity   Worried About Charity fundraiser in the Last Year: Never true   Arboriculturist in the Last Year: Never true  Transportation Needs: No Transportation Needs   Lack of Transportation (Medical): No   Lack of Transportation (Non-Medical): No  Physical Activity: Inactive   Days of Exercise per Week: 0 days  Minutes of Exercise per Session: 0 min  Stress: No Stress Concern Present   Feeling of Stress : Not at all  Social Connections: Moderately Integrated   Frequency of Communication with Friends and Family: More than three times a week   Frequency of Social Gatherings with Friends and Family: Once a week   Attends Religious Services: More than 4 times per year   Active Member of [employer] or Organizations: No   Attends Music therapist: More than 4 times per year   Marital Status: Never married    Family History  Problem Relation Age of Onset   Prostate cancer Father    Cancer Father        prostate   Cancer Mother        waldenstruns microgobular anemia   Hyperparathyroidism Mother    Alcohol abuse Other    Heart disease Other    Hypertension Other    Diabetes Other    Pancreatic cancer Other    Hyperparathyroidism Sister    Cancer Maternal Grandfather        pancreatic    Review of Systems  Constitutional:  Negative for chills and fever.  Respiratory:  Positive for shortness of breath. Negative for cough, chest tightness and wheezing.   Cardiovascular:  Negative for chest pain, palpitations and leg swelling.  Neurological:  Positive for light-headedness (occ). Negative for headaches.      Objective:   Vitals:   12/31/21 1009  BP: 140/82  Pulse: 64  Temp: 97.9 F (36.6 C)  SpO2: 97%   BP Readings from Last 3 Encounters:  12/31/21 140/82  12/14/21 138/88  11/18/21 (!) 144/76   Wt Readings from Last 3 Encounters:  12/31/21 116 lb 9.6 oz (52.9 kg)  12/23/21 117 lb (53.1 kg)  12/14/21 117 lb 1.6 oz (53.1 kg)    Body mass index is 22.03 kg/m.   Physical Exam    Constitutional: Appears well-developed and well-nourished. No distress.  HENT:  Head: Normocephalic and atraumatic.  Neck: Neck supple. No tracheal deviation present. No thyromegaly present.  No cervical lymphadenopathy Cardiovascular: Normal rate, regular rhythm and normal heart sounds.   No murmur heard. No carotid bruit .  No edema Pulmonary/Chest: Effort normal and breath sounds normal. No respiratory distress. No has no wheezes. No rales.  Skin: Skin is warm and dry. Not diaphoretic.  Psychiatric: Normal mood and affect. Behavior is normal.      Assessment & Plan:    See Problem List for Assessment and Plan of chronic medical problems.

## 2021-12-29 DIAGNOSIS — Z1231 Encounter for screening mammogram for malignant neoplasm of breast: Secondary | ICD-10-CM | POA: Diagnosis not present

## 2021-12-29 LAB — HM MAMMOGRAPHY

## 2021-12-31 ENCOUNTER — Other Ambulatory Visit: Payer: Self-pay

## 2021-12-31 ENCOUNTER — Ambulatory Visit (INDEPENDENT_AMBULATORY_CARE_PROVIDER_SITE_OTHER): Payer: Medicare PPO | Admitting: Internal Medicine

## 2021-12-31 VITALS — BP 140/82 | HR 64 | Temp 97.9°F | Ht 61.0 in | Wt 116.6 lb

## 2021-12-31 DIAGNOSIS — R7303 Prediabetes: Secondary | ICD-10-CM | POA: Diagnosis not present

## 2021-12-31 DIAGNOSIS — J449 Chronic obstructive pulmonary disease, unspecified: Secondary | ICD-10-CM | POA: Diagnosis not present

## 2021-12-31 DIAGNOSIS — I10 Essential (primary) hypertension: Secondary | ICD-10-CM | POA: Diagnosis not present

## 2021-12-31 DIAGNOSIS — R0602 Shortness of breath: Secondary | ICD-10-CM | POA: Diagnosis not present

## 2021-12-31 LAB — COMPREHENSIVE METABOLIC PANEL
ALT: 13 U/L (ref 0–35)
AST: 20 U/L (ref 0–37)
Albumin: 4.1 g/dL (ref 3.5–5.2)
Alkaline Phosphatase: 62 U/L (ref 39–117)
BUN: 26 mg/dL — ABNORMAL HIGH (ref 6–23)
CO2: 29 mEq/L (ref 19–32)
Calcium: 11 mg/dL — ABNORMAL HIGH (ref 8.4–10.5)
Chloride: 109 mEq/L (ref 96–112)
Creatinine, Ser: 1.21 mg/dL — ABNORMAL HIGH (ref 0.40–1.20)
GFR: 41.63 mL/min — ABNORMAL LOW (ref >60.00)
Glucose, Bld: 90 mg/dL (ref 70–99)
Potassium: 5.1 mEq/L (ref 3.5–5.1)
Sodium: 142 mEq/L (ref 135–145)
Total Bilirubin: 0.4 mg/dL (ref 0.2–1.2)
Total Protein: 7.5 g/dL (ref 6.0–8.3)

## 2021-12-31 LAB — CBC WITH DIFFERENTIAL/PLATELET
Basophils Absolute: 0.1 10*3/uL (ref 0.0–0.1)
Basophils Relative: 0.8 % (ref 0.0–3.0)
Eosinophils Absolute: 0.2 10*3/uL (ref 0.0–0.7)
Eosinophils Relative: 2.6 % (ref 0.0–5.0)
HCT: 41.1 % (ref 36.0–46.0)
Hemoglobin: 13 g/dL (ref 12.0–15.0)
Lymphocytes Relative: 11.9 % — ABNORMAL LOW (ref 12.0–46.0)
Lymphs Abs: 1 10*3/uL (ref 0.7–4.0)
MCHC: 31.6 g/dL (ref 30.0–36.0)
MCV: 93.1 fl (ref 78.0–100.0)
Monocytes Absolute: 0.4 10*3/uL (ref 0.1–1.0)
Monocytes Relative: 4.9 % (ref 3.0–12.0)
Neutro Abs: 6.9 10*3/uL (ref 1.4–7.7)
Neutrophils Relative %: 79.8 % — ABNORMAL HIGH (ref 43.0–77.0)
Platelets: 246 10*3/uL (ref 150.0–400.0)
RBC: 4.42 Mil/uL (ref 3.87–5.11)
RDW: 15.1 % (ref 11.5–15.5)
WBC: 8.7 10*3/uL (ref 4.0–10.5)

## 2021-12-31 LAB — HEMOGLOBIN A1C: Hgb A1c MFr Bld: 6.3 % (ref 4.6–6.5)

## 2021-12-31 NOTE — Assessment & Plan Note (Signed)
Chronic Blood pressure well controlled Continue amlodipine 5 mg daily, Diovan 320 mg daily, nadolol 40 mg twice daily

## 2021-12-31 NOTE — Assessment & Plan Note (Signed)
Chronic SOB Improved abx and inhaler Continue wixela

## 2021-12-31 NOTE — Assessment & Plan Note (Signed)
Chronic Check a1c Low sugar / carb diet 

## 2021-12-31 NOTE — Assessment & Plan Note (Addendum)
Chronic, but has gotten worse Improved after abx for PNA and wixela Recently saw cardiology and cardiac work-up did not reveal a cardiac cause for the shortness of breath Has abnormal chest x-ray, which is not new-chronic changes, possible atypical infection, COPD.  Has seen pulmonary in the past, but I did refer her back to them for reevaluation - she will call to schedule Physical deconditioning likely contributing along with kyphosis

## 2022-01-06 ENCOUNTER — Encounter: Payer: Self-pay | Admitting: Internal Medicine

## 2022-01-06 NOTE — Progress Notes (Signed)
Outside notes received. Information abstracted. Notes sent to scan.  

## 2022-01-08 ENCOUNTER — Other Ambulatory Visit: Payer: Self-pay | Admitting: Internal Medicine

## 2022-01-12 NOTE — Telephone Encounter (Signed)
Prior auth required for PROLIA ? ?PA PROCESS DETAILS: PA is required. PA can be initiated by calling [phone] or online at ?[email or web address]. ? ?

## 2022-01-26 ENCOUNTER — Other Ambulatory Visit: Payer: Self-pay | Admitting: Internal Medicine

## 2022-02-02 ENCOUNTER — Other Ambulatory Visit: Payer: Self-pay | Admitting: Diagnostic Neuroimaging

## 2022-02-05 ENCOUNTER — Encounter (HOSPITAL_BASED_OUTPATIENT_CLINIC_OR_DEPARTMENT_OTHER): Payer: Self-pay | Admitting: Cardiology

## 2022-02-08 ENCOUNTER — Telehealth: Payer: Self-pay | Admitting: Family Medicine

## 2022-02-08 MED ORDER — AIMOVIG 70 MG/ML ~~LOC~~ SOAJ: SUBCUTANEOUS | 0 refills | Status: DC

## 2022-02-08 MED ORDER — TOPIRAMATE 50 MG PO TABS: 50.0000 mg | ORAL_TABLET | Freq: Two times a day (BID) | ORAL | 0 refills | Status: DC

## 2022-02-08 NOTE — Telephone Encounter (Signed)
PA Aimovig submitted on CMM. Key: [id number]. Waiting on determination from [facility]. ?

## 2022-02-08 NOTE — Telephone Encounter (Signed)
E-scribed refills.  

## 2022-02-08 NOTE — Telephone Encounter (Signed)
Pt request refills for Erenumab-aooe (AIMOVIG) 70 MG/ML SOAJ and topiramate (TOPAMAX) 50 MG tablet at [id number]? ?Pt schedule appt on 05/26/22 ?

## 2022-02-09 NOTE — Telephone Encounter (Signed)
PA Case: [id number], Status: Approved, Coverage Starts on: 11/08/2020 12:00:00 AM, Coverage Ends on: 11/07/2022 12:00:00 AM. Questions? Contact [phone]. ?

## 2022-02-12 ENCOUNTER — Ambulatory Visit: Payer: Medicare PPO | Admitting: Pulmonary Disease

## 2022-02-12 ENCOUNTER — Encounter: Payer: Self-pay | Admitting: Pulmonary Disease

## 2022-02-12 VITALS — BP 130/84 | HR 62 | Ht 61.0 in | Wt 113.0 lb

## 2022-02-12 DIAGNOSIS — J449 Chronic obstructive pulmonary disease, unspecified: Secondary | ICD-10-CM | POA: Diagnosis not present

## 2022-02-12 MED ORDER — TRELEGY ELLIPTA 100-62.5-25 MCG/ACT IN AEPB: 1.0000 | INHALATION_SPRAY | Freq: Every day | RESPIRATORY_TRACT | 0 refills | Status: DC

## 2022-02-12 NOTE — Progress Notes (Signed)
? ?Synopsis: Referred in April 2023 for COPD by Billey Gosling, MD ? ?Subjective:  ? ?PATIENT ID: Joy Martin GENDER: female DOB: 22-Sep-1941, MRN: [id number] ? ?HPI ? ?Chief Complaint  ?Patient presents with  ? Consult  ?  Referred by PCP for history of COPD. Has noticed that she has been more SOB recently.   ? ?Joy Martin is an 81 year old woman, former smoker with CKD, GERD, and hypertension who is referred to pulmonary clinic for COPD.  ? ?Last seen in 2014 by Dr. Melvyn Novas. PFTs in 2014 showed mild obstructive defect. 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  ? ?She is currently on wixela inhub 100-38mg. She experience mainly exertional shortness of breath. Denies cough, fevers or chills. She may have sweats from time to time. She denies weight loss. She has chronic sinus congestion and post-nasal drainage. She is using as needed flonase. Denies GERD. ? ?She lives alone. She quit smoking in the 80s. She is a retired tPharmacist, hospital  ? ?Past Medical History:  ?Diagnosis Date  ? ALLERGIC RHINITIS, CHRONIC   ? ANEMIA   ? Arthritis   ? "knees mainly; thumbs"  ? 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 [facility] right upper [facility]  ? Chronic sinusitis   ? follows with ENT for same  ? CKD (chronic kidney disease) 2018  ? [place]  ? GOLD 1, follows with pulm for same  ? Episodic tension type headache   ? EXOGENOUS OBESITY   ? GERD   ? HYPERLIPIDEMIA   ? Hypertension   ? Migraines 07/10/2012  ? "over; last one was @ age 81  ? OSTEOPENIA   ? Pneumonia 2018-2019  ? x 2  ?  ? ?Family History  ?Problem Relation Age of Onset  ? Prostate cancer Father   ? Cancer Father   ?     prostate  ? Cancer Mother   ?     waldenstruns microgobular anemia  ? Hyperparathyroidism Mother   ? Alcohol abuse Other   ? Heart disease Other   ? Hypertension Other   ? Diabetes Other   ? Pancreatic cancer Other   ?  Hyperparathyroidism Sister   ? Cancer Maternal Grandfather   ?     pancreatic  ?  ? ?Social History  ? ?Socioeconomic History  ? Marital status: Single  ?  Spouse name: Not on file  ? Number of children: 0  ? Years of education: 153 ? Highest education level: Not on file  ?Occupational History  ?  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 Smoking status: Former  ?  Packs/day: 0.75  ?  Years: 20.00  ?  Pack years: 15.00  ?  Types: Cigarettes  ?  Quit date: 07/25/1982  ?  Years since quitting: 39.5  ? Smokeless tobacco: Never  ? Tobacco comments:  ?  07/10/2012 "stopped smoking cigarettes 1980's"  ?Substance and Sexual Activity  ? Alcohol use: Yes  ?  Comment: 07/10/2012 "glass of wine 1-2X/yr"  ? Drug use: No  ? Sexual activity: Never  ?Other Topics Concern  ? Not on file  ?Social History Narrative  ? 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 Caffeine- coffee 1 cup, some Coke  ? ?Social Determinants of Health  ? ?Financial Resource Strain: Low Risk   ? Difficulty of Paying Living Expenses: Not hard at all  ?  Food Insecurity: No Food Insecurity  ? Worried About Charity fundraiser in the Last Year: Never true  ? Ran Out of Food in the Last Year: Never true  ?Transportation Needs: No Transportation Needs  ? Lack of Transportation (Medical): No  ? Lack of Transportation (Non-Medical): No  ?Physical Activity: Inactive  ? Days of Exercise per Week: 0 days  ? Minutes of Exercise per Session: 0 min  ?Stress: No Stress Concern Present  ? Feeling of Stress : Not at all  ?Social Connections: Moderately Integrated  ? Frequency of Communication with Friends and Family: More than three times a week  ? Frequency of Social Gatherings with Friends and Family: Once a week  ? Attends Religious Services: More than 4 times per year  ? Active Member of Clubs or Organizations: No  ? Attends Archivist Meetings: More than 4 times per year  ? Marital Status: Never married  ?Intimate Partner Violence: Not on file  ?  ? ?Allergies   ?Allergen Reactions  ? Allegra [Fexofenadine Hcl] Other (See Comments)  ?  Headache, "took the pill; I got flu symptoms"  ? Prilosec [Omeprazole]   ?  Fecal incontince  ? Ranitidine   ?  Fecal incontince  ?  ? ?Outpatient Medications Prior to Visit  ?Medication Sig Dispense Refill  ? amLODipine (NORVASC) 5 MG tablet TAKE 1 TABLET BY MOUTH EVERY DAY 90 tablet 0  ? aspirin-acetaminophen-caffeine (EXCEDRIN MIGRAINE) 250-250-65 MG tablet Take 2 tablets by mouth every 6 (six) hours as needed for headache.    ? Cranberry 500 MG CAPS Take 500 mg by mouth daily.    ? cycloSPORINE (RESTASIS) 0.05 % ophthalmic emulsion 1 drop 2 (two) times daily.    ? denosumab (PROLIA) 60 MG/ML SOSY injection Inject 60 mg into the skin every 6 (six) months.    ? Erenumab-aooe (AIMOVIG) 70 MG/ML SOAJ INJECT 70MG INTO THE SKIN EVERY 30 DAYS 3 mL 0  ? esomeprazole (NEXIUM) 40 MG capsule TAKE 1 CAPSULE (40 MG TOTAL) BY MOUTH DAILY AT 12 NOON. 90 capsule 1  ? famotidine (PEPCID) 40 MG tablet TAKE 1 TABLET BY MOUTH EVERY DAY 90 tablet 1  ? Flaxseed, Linseed, (FLAXSEED OIL PO) Take 2 tablets by mouth daily.    ? fluticasone (FLONASE) 50 MCG/ACT nasal spray USE 1 SPRAY IN EACH NOSTRIL DAILY 48 mL 1  ? nadolol (CORGARD) 40 MG tablet TAKE 1 TABLET BY MOUTH TWICE A DAY 180 tablet 1  ? simvastatin (ZOCOR) 40 MG tablet TAKE 1 TABLET BY MOUTH EVERYDAY AT BEDTIME 90 tablet 0  ? topiramate (TOPAMAX) 50 MG tablet Take 1 tablet (50 mg total) by mouth 2 (two) times daily. 180 tablet 0  ? valsartan (DIOVAN) 320 MG tablet TAKE 1 TABLET BY MOUTH EVERY DAY 90 tablet 1  ? WIXELA INHUB 100-50 MCG/ACT AEPB INHALE 1 PUFF BY MOUTH TWICE A DAY 60 each 5  ? ?No facility-administered medications prior to visit.  ? ? ?Review of Systems  ?Constitutional:  Negative for chills, fever, malaise/fatigue and weight loss.  ?HENT:  Negative for congestion, sinus pain and sore throat.   ?Eyes: Negative.   ?Respiratory:  Positive for shortness of breath. Negative for cough,  hemoptysis, sputum production and wheezing.   ?Cardiovascular:  Negative for chest pain, palpitations, orthopnea, claudication and leg swelling.  ?Gastrointestinal:  Negative for abdominal pain, heartburn, nausea and vomiting.  ?Genitourinary: Negative.   ?Musculoskeletal:  Negative for joint pain and myalgias.  ?Skin:  Negative for rash.  ?Neurological:  Negative for weakness.  ?Endo/Heme/Allergies: Negative.   ?Psychiatric/Behavioral: Negative.    ? ? ? ?Objective:  ? ?Vitals:  ? 02/12/22 1525  ?BP: 130/84  ?Pulse: 62  ?SpO2: 96%  ?Weight: 113 lb (51.3 kg)  ?Height: _0  (1.549 m)  ? ? ? ?Physical Exam ?Constitutional:   ?   General: She is not in acute distress. ?   Appearance: She is not ill-appearing.  ?HENT:  ?   Head: Normocephalic and atraumatic.  ?Eyes:  ?   General: No scleral icterus. ?   Conjunctiva/sclera: Conjunctivae normal.  ?   Pupils: Pupils are equal, round, and reactive to light.  ?Cardiovascular:  ?   Rate and Rhythm: Normal rate and regular rhythm.  ?   Pulses: Normal pulses.  ?   Heart sounds: Normal heart sounds. No murmur heard. ?Pulmonary:  ?   Effort: Pulmonary effort is normal.  ?   Breath sounds: Normal breath sounds. Decreased air movement present. No wheezing, rhonchi or rales.  ?Abdominal:  ?   General: Bowel sounds are normal.  ?   Palpations: Abdomen is soft.  ?Musculoskeletal:  ?   Right lower leg: No edema.  ?   Left lower leg: No edema.  ?Lymphadenopathy:  ?   Cervical: No cervical adenopathy.  ?Skin: ?   General: Skin is warm and dry.  ?Neurological:  ?   General: No focal deficit present.  ?   Mental Status: She is alert.  ?Psychiatric:     ?   Mood and Affect: Mood normal.     ?   Behavior: Behavior normal.     ?   Thought Content: Thought content normal.     ?   Judgment: Judgment normal.  ? ?CBC ?   ?Component Value Date/Time  ? WBC 8.7 12/31/2021 1052  ? RBC 4.42 12/31/2021 1052  ? HGB 13.0 12/31/2021 1052  ? HCT 41.1 12/31/2021 1052  ? PLT 246.0 12/31/2021 1052  ? MCV  93.1 12/31/2021 1052  ? MCH 30.0 06/03/2020 1153  ? MCHC 31.6 12/31/2021 1052  ? RDW 15.1 12/31/2021 1052  ? LYMPHSABS 1.0 12/31/2021 1052  ? MONOABS 0.4 12/31/2021 1052  ? EOSABS 0.2 12/31/2021 1052  ? BASOSABS 0.1 12/31/2021

## 2022-02-12 NOTE — Addendum Note (Signed)
Addended by: Valerie Salts on: 02/12/2022 04:07 PM ? ? Modules accepted: Orders ? ?

## 2022-02-12 NOTE — Patient Instructions (Addendum)
Try trelegy ellipta inhaler 1 puff daily and monitor for improvement in your shortness of breath ?- rinse mouth out after each use ? ?Please let us know if you notice improvement and we will send in prescription to continue on this medicine ? ?Follow up in 6 months ?

## 2022-02-25 NOTE — Telephone Encounter (Signed)
Prior auth initiated via [email or web address] ?Key: [id number] ? ? ?

## 2022-02-27 NOTE — Telephone Encounter (Signed)
Pt ready for scheduling on or after 03/06/22 ? ?Out-of-pocket cost due at time of visit: $0 ? ?Primary: [employer] ?Prolia co-insurance: 0% ?Admin fee co-insurance: 0% ? ?Secondary: n/a ?Prolia co-insurance:  ?Admin fee co-insurance:  ? ?Deductible: does not apply ? ?Prior Auth: APPROVED ?PA# [id number] (KEY: [id number]) ?Valid: 04/22/20-11/07/22 ?  ? ?** This summary of benefits is an estimation of the patient's out-of-pocket cost. Exact cost may very based on individual plan coverage.  ? ?

## 2022-03-02 DIAGNOSIS — H04123 Dry eye syndrome of bilateral lacrimal glands: Secondary | ICD-10-CM | POA: Diagnosis not present

## 2022-03-02 DIAGNOSIS — H40023 Open angle with borderline findings, high risk, bilateral: Secondary | ICD-10-CM | POA: Diagnosis not present

## 2022-03-02 DIAGNOSIS — Z961 Presence of intraocular lens: Secondary | ICD-10-CM | POA: Diagnosis not present

## 2022-03-02 DIAGNOSIS — H35033 Hypertensive retinopathy, bilateral: Secondary | ICD-10-CM | POA: Diagnosis not present

## 2022-03-02 DIAGNOSIS — H353131 Nonexudative age-related macular degeneration, bilateral, early dry stage: Secondary | ICD-10-CM | POA: Diagnosis not present

## 2022-03-09 DIAGNOSIS — N189 Chronic kidney disease, unspecified: Secondary | ICD-10-CM | POA: Diagnosis not present

## 2022-03-09 DIAGNOSIS — N1831 Chronic kidney disease, stage 3a: Secondary | ICD-10-CM | POA: Diagnosis not present

## 2022-03-12 ENCOUNTER — Ambulatory Visit (INDEPENDENT_AMBULATORY_CARE_PROVIDER_SITE_OTHER): Payer: Medicare PPO

## 2022-03-12 DIAGNOSIS — Z0000 Encounter for general adult medical examination without abnormal findings: Secondary | ICD-10-CM

## 2022-03-12 NOTE — Progress Notes (Signed)
? ?Subjective:  ? Joy Martin is a 81 y.o. female who presents for Medicare Annual (Subsequent) preventive examination. ? ?I connected with Rosana Fret today by telephone and verified that I am speaking with the correct person using two identifiers. ?Location patient: home ?Location provider: work ?Persons participating in the virtual visit: patient, provider. ?  ?I discussed the limitations, risks, security and privacy concerns of performing an evaluation and management service by telephone and the availability of in person appointments. I also discussed with the patient that there may be a patient responsible charge related to this service. The patient expressed understanding and verbally consented to this telephonic visit.  ?  ?Interactive audio and video telecommunications were attempted between this provider and patient, however failed, due to patient having technical difficulties OR patient did not have access to video capability.  We continued and completed visit with audio only. ? ?  ?Review of Systems    ? ?Cardiac Risk Factors include: advanced age (>63mn, >>34women);hypertension ? ?   ?Objective:  ?  ?Today's Vitals  ? ?There is no height or weight on file to calculate BMI. ? ? ?  03/12/2022  ? 11:34 AM 03/05/2021  ? 10:23 AM 04/06/2017  ? 11:11 AM 07/28/2016  ?  1:54 PM 07/10/2012  ?  7:45 PM  ?Advanced Directives  ?Does Patient Have a Medical Advance Directive? Yes Yes No Yes Patient has advance directive, copy not in chart  ?Type of [place] will Living will;Healthcare Power of [place] ?Does patient want to make changes to medical advance directive?  No - Patient declined     ?[place] of [place]? No - copy requested No - copy requested   Copy requested from other (Comment)  ?Would patient like information on creating a medical advance directive?   No - Patient declined    ? ? ?Current Medications  (verified) ?Outpatient Encounter Medications as of 03/12/2022  ?Medication Sig  ? amLODipine (NORVASC) 5 MG tablet TAKE 1 TABLET BY MOUTH EVERY DAY  ? aspirin-acetaminophen-caffeine (EXCEDRIN MIGRAINE) 250-250-65 MG tablet Take 2 tablets by mouth every 6 (six) hours as needed for headache.  ? Cranberry 500 MG CAPS Take 500 mg by mouth daily.  ? cycloSPORINE (RESTASIS) 0.05 % ophthalmic emulsion 1 drop 2 (two) times daily.  ? denosumab (PROLIA) 60 MG/ML SOSY injection Inject 60 mg into the skin every 6 (six) months.  ? Erenumab-aooe (AIMOVIG) 70 MG/ML SOAJ INJECT '70MG'$  INTO THE SKIN EVERY 30 DAYS  ? esomeprazole (NEXIUM) 40 MG capsule TAKE 1 CAPSULE (40 MG TOTAL) BY MOUTH DAILY AT 12 NOON.  ? famotidine (PEPCID) 40 MG tablet TAKE 1 TABLET BY MOUTH EVERY DAY  ? Flaxseed, Linseed, (FLAXSEED OIL PO) Take 2 tablets by mouth daily.  ? fluticasone (FLONASE) 50 MCG/ACT nasal spray USE 1 SPRAY IN EACH NOSTRIL DAILY  ? Fluticasone-Umeclidin-Vilant (TRELEGY ELLIPTA) 100-62.5-25 MCG/ACT AEPB Inhale 1 puff into the lungs daily.  ? nadolol (CORGARD) 40 MG tablet TAKE 1 TABLET BY MOUTH TWICE A DAY  ? simvastatin (ZOCOR) 40 MG tablet TAKE 1 TABLET BY MOUTH EVERYDAY AT BEDTIME  ? topiramate (TOPAMAX) 50 MG tablet Take 1 tablet (50 mg total) by mouth 2 (two) times daily.  ? valsartan (DIOVAN) 320 MG tablet TAKE 1 TABLET BY MOUTH EVERY DAY  ? [DISCONTINUED] Calcium Carbonate (CALCIUM 500 PO) Take by mouth.    ? ?No facility-administered encounter medications  on file as of 03/12/2022.  ? ? ?Allergies (verified) ?Allegra [fexofenadine hcl], Prilosec [omeprazole], and Ranitidine  ? ?History: ?Past Medical History:  ?Diagnosis Date  ? ALLERGIC RHINITIS, CHRONIC   ? ANEMIA   ? Arthritis   ? "knees mainly; thumbs"  ? 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 removed right upper lip/perinostril [name])  ? Chronic sinusitis   ? follows with ENT for same  ? CKD (chronic kidney disease) 2018  ? COPD (chronic obstructive pulmonary disease)  ([place]) dx 2013  ? GOLD 1, follows with pulm for same  ? Episodic tension type headache   ? EXOGENOUS OBESITY   ? GERD   ? HYPERLIPIDEMIA   ? Hypertension   ? Migraines 07/10/2012  ? "over; last one was @ age 71"  ? OSTEOPENIA   ? Pneumonia 2018-2019  ? x 2  ? ?Past Surgical History:  ?Procedure Laterality Date  ? APPENDECTOMY  07/10/2012  ? BREAST CYST ASPIRATION  ?1980's  ? right  ? EYE SURGERY    ? both eyes  ? KNEE ARTHROSCOPY  1970's or 1980's  ? right; torn cartilage  ? LAPAROSCOPIC APPENDECTOMY  07/10/2012  ? Procedure: APPENDECTOMY LAPAROSCOPIC;  Surgeon: Harl Bowie, MD;  Location: [place];  Service: General;  Laterality: N/A;  ? MOHS SURGERY  01/2016  ? nose  ? MOLE REMOVAL  1957  ? "my back"  ? SKIN CANCER EXCISION    ? "2 on my back; 2 on my face"  ? TONSILLECTOMY AND ADENOIDECTOMY  1946  ? [place] EXTRACTION  1959  ? ?Family History  ?Problem Relation Age of Onset  ? Prostate cancer Father   ? Cancer Father   ?     prostate  ? Cancer Mother   ?     waldenstruns microgobular anemia  ? Hyperparathyroidism Mother   ? Alcohol abuse Other   ? Heart disease Other   ? Hypertension Other   ? Diabetes Other   ? Pancreatic cancer Other   ? Hyperparathyroidism Sister   ? Cancer Maternal Grandfather   ?     pancreatic  ? ?Social History  ? ?Socioeconomic History  ? Marital status: Single  ?  Spouse name: Not on file  ? Number of children: 0  ? Years of education: 68  ? Highest education level: Not on file  ?Occupational History  ?  Comment: retired Pharmacist, hospital, 5th grade  ?Tobacco Use  ? Smoking status: Former  ?  Packs/day: 0.75  ?  Years: 20.00  ?  Pack years: 15.00  ?  Types: Cigarettes  ?  Quit date: 07/25/1982  ?  Years since quitting: 39.6  ? Smokeless tobacco: Never  ? Tobacco comments:  ?  07/10/2012 "stopped smoking cigarettes 1980's"  ?Substance and Sexual Activity  ? Alcohol use: Yes  ?  Comment: 07/10/2012 "glass of wine 1-2X/yr"  ? Drug use: No  ? Sexual activity: Never  ?Other Topics Concern  ? Not on file   ?Social History Narrative  ? Retired Pharmacist, hospital, 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  ? Caffeine- coffee 1 cup, some Coke  ? ?Social Determinants of Health  ? ?Financial Resource Strain: Low Risk   ? Difficulty of Paying Living Expenses: Not hard at all  ?Food Insecurity: No Food Insecurity  ? Worried About Charity fundraiser in the Last Year: Never true  ? Ran Out of Food in the Last Year: Never true  ?Transportation Needs: No Transportation Needs  ?  Lack of Transportation (Medical): No  ? Lack of Transportation (Non-Medical): No  ?Physical Activity: Inactive  ? Days of Exercise per Week: 0 days  ? Minutes of Exercise per Session: 0 min  ?Stress: No Stress Concern Present  ? Feeling of Stress : Not at all  ?Social Connections: Moderately Integrated  ? Frequency of Communication with Friends and Family: Three times a week  ? Frequency of Social Gatherings with Friends and Family: Three times a week  ? Attends Religious Services: More than 4 times per year  ? Active Member of Clubs or Organizations: Yes  ? Attends Archivist Meetings: More than 4 times per year  ? Marital Status: Never married  ? ? ?Tobacco Counseling ?Counseling given: Not Answered ?Tobacco comments: 07/10/2012 "stopped smoking cigarettes 1980's" ? ? ?Clinical Intake: ? ?Pre-visit preparation completed: Yes ? ?Pain : No/denies pain ? ?  ? ?Nutritional Risks: None ?Diabetes: No ? ?How often do you need to have someone help you when you read instructions, pamphlets, or other written materials from your doctor or pharmacy?: 1 - Never ?What is the last grade level you completed in school?: college ? ?Diabetic?no ? ?Interpreter Needed?: No ? ?Information entered by :: [email or web address] ? ? ?Activities of Daily Living ? ?  03/12/2022  ? 11:36 AM  ?In your present state of health, do you have any difficulty performing the following activities:  ?Hearing? 0  ?Vision? 0  ?Difficulty concentrating or making decisions? 0  ?Walking or climbing stairs? 0  ?Dressing or bathing? 0   ?Doing errands, shopping? 0  ?Preparing Food and eating ? N  ?Using the Toilet? N  ?In the past six months, have you accidently leaked urine? N  ?Do you have problems with loss of bowel control? N  ?Manag

## 2022-03-12 NOTE — Patient Instructions (Signed)
Ms. Massaro , ?Thank you for taking time to come for your Medicare Wellness Visit. I appreciate your ongoing commitment to your health goals. Please review the following plan we discussed and let me know if I can assist you in the future.  ? ?Screening recommendations/referrals: ?Colonoscopy: no longer required  ?Mammogram: no longer required  ?Bone Density: 06/12/2020 ?Recommended yearly ophthalmology/optometry visit for glaucoma screening and checkup ?Recommended yearly dental visit for hygiene and checkup ? ?Vaccinations: ?Influenza vaccine: completed  ?Pneumococcal vaccine: completed  ?Tdap vaccine: due  ?Shingles vaccine: completed    ? ?Advanced directives: yes  ? ?Conditions/risks identified: none  ? ?Next appointment: none ? ? ?Preventive Care 83 Years and Older, Female ?Preventive care refers to lifestyle choices and visits with your health care provider that can promote health and wellness. ?What does preventive care include? ?A yearly physical exam. This is also called an annual well check. ?Dental exams once or twice a year. ?Routine eye exams. Ask your health care provider how often you should have your eyes checked. ?Personal lifestyle choices, including: ?Daily care of your teeth and gums. ?Regular physical activity. ?Eating a healthy diet. ?Avoiding tobacco and drug use. ?Limiting alcohol use. ?Practicing safe sex. ?Taking low-dose aspirin every day. ?Taking vitamin and mineral supplements as recommended by your health care provider. ?What happens during an annual well check? ?The services and screenings done by your health care provider during your annual well check will depend on your age, overall health, lifestyle risk factors, and family history of disease. ?Counseling  ?Your health care provider may ask you questions about your: ?Alcohol use. ?Tobacco use. ?Drug use. ?Emotional well-being. ?Home and relationship well-being. ?Sexual activity. ?Eating habits. ?History of falls. ?Memory and ability  to understand (cognition). ?Work and work Statistician. ?Reproductive health. ?Screening  ?You may have the following tests or measurements: ?Height, weight, and BMI. ?Blood pressure. ?Lipid and cholesterol levels. These may be checked every 5 years, or more frequently if you are over 83 years old. ?Skin check. ?Lung cancer screening. You may have this screening every year starting at age 83 if you have a 30-pack-year history of smoking and currently smoke or have quit within the past 15 years. ?Fecal occult blood test (FOBT) of the stool. You may have this test every year starting at age 65. ?Flexible sigmoidoscopy or colonoscopy. You may have a sigmoidoscopy every 5 years or a colonoscopy every 10 years starting at age 40. ?Hepatitis C blood test. ?Hepatitis B blood test. ?Sexually transmitted disease (STD) testing. ?Diabetes screening. This is done by checking your blood sugar (glucose) after you have not eaten for a while (fasting). You may have this done every 1-3 years. ?Bone density scan. This is done to screen for osteoporosis. You may have this done starting at age 83. ?Mammogram. This may be done every 1-2 years. Talk to your health care provider about how often you should have regular mammograms. ?Talk with your health care provider about your test results, treatment options, and if necessary, the need for more tests. ?Vaccines  ?Your health care provider may recommend certain vaccines, such as: ?Influenza vaccine. This is recommended every year. ?Tetanus, diphtheria, and acellular pertussis (Tdap, Td) vaccine. You may need a Td booster every 10 years. ?Zoster vaccine. You may need this after age 49. ?Pneumococcal 13-valent conjugate (PCV13) vaccine. One dose is recommended after age 83. ?Pneumococcal polysaccharide (PPSV23) vaccine. One dose is recommended after age 19. ?Talk to your health care provider about which screenings and vaccines  you need and how often you need them. ?This information is not  intended to replace advice given to you by your health care provider. Make sure you discuss any questions you have with your health care provider. ?Document Released: 11/21/2015 Document Revised: 07/14/2016 Document Reviewed: 08/26/2015 ?Elsevier Interactive Patient Education ? 2017 [place]. ? ?Fall Prevention in the Home ?Falls can cause injuries. They can happen to people of all ages. There are many things you can do to make your home safe and to help prevent falls. ?What can I do on the outside of my home? ?Regularly fix the edges of walkways and driveways and fix any cracks. ?Remove anything that might make you trip as you walk through a door, such as a raised step or threshold. ?Trim any bushes or trees on the path to your home. ?Use bright outdoor lighting. ?Clear any walking paths of anything that might make someone trip, such as rocks or tools. ?Regularly check to see if handrails are loose or broken. Make sure that both sides of any steps have handrails. ?Any raised decks and porches should have guardrails on the edges. ?Have any leaves, snow, or ice cleared regularly. ?Use sand or salt on walking paths during winter. ?Clean up any spills in your garage right away. This includes oil or grease spills. ?What can I do in the bathroom? ?Use night lights. ?Install grab bars by the toilet and in the tub and shower. Do not use towel bars as grab bars. ?Use non-skid mats or decals in the tub or shower. ?If you need to sit down in the shower, use a plastic, non-slip stool. ?Keep the floor dry. Clean up any water that spills on the floor as soon as it happens. ?Remove soap buildup in the tub or shower regularly. ?Attach bath mats securely with double-sided non-slip rug tape. ?Do not have throw rugs and other things on the floor that can make you trip. ?What can I do in the bedroom? ?Use night lights. ?Make sure that you have a light by your bed that is easy to reach. ?Do not use any sheets or blankets that are  too big for your bed. They should not hang down onto the floor. ?Have a firm chair that has side arms. You can use this for support while you get dressed. ?Do not have throw rugs and other things on the floor that can make you trip. ?What can I do in the kitchen? ?Clean up any spills right away. ?Avoid walking on wet floors. ?Keep items that you use a lot in easy-to-reach places. ?If you need to reach something above you, use a strong step stool that has a grab bar. ?Keep electrical cords out of the way. ?Do not use floor polish or wax that makes floors slippery. If you must use wax, use non-skid floor wax. ?Do not have throw rugs and other things on the floor that can make you trip. ?What can I do with my stairs? ?Do not leave any items on the stairs. ?Make sure that there are handrails on both sides of the stairs and use them. Fix handrails that are broken or loose. Make sure that handrails are as long as the stairways. ?Check any carpeting to make sure that it is firmly attached to the stairs. Fix any carpet that is loose or worn. ?Avoid having throw rugs at the top or bottom of the stairs. If you do have throw rugs, attach them to the floor with carpet tape. ?Make sure  that you have a light switch at the top of the stairs and the bottom of the stairs. If you do not have them, ask someone to add them for you. ?What else can I do to help prevent falls? ?Wear shoes that: ?Do not have high heels. ?Have rubber bottoms. ?Are comfortable and fit you well. ?Are closed at the toe. Do not wear sandals. ?If you use a stepladder: ?Make sure that it is fully opened. Do not climb a closed stepladder. ?Make sure that both sides of the stepladder are locked into place. ?Ask someone to hold it for you, if possible. ?Clearly mark and make sure that you can see: ?Any grab bars or handrails. ?First and last steps. ?Where the edge of each step is. ?Use tools that help you move around (mobility aids) if they are needed. These  include: ?Canes. ?Walkers. ?Scooters. ?Crutches. ?Turn on the lights when you go into a dark area. Replace any light bulbs as soon as they burn out. ?Set up your furniture so you have a clear path. Avoid moving

## 2022-03-17 DIAGNOSIS — D631 Anemia in chronic kidney disease: Secondary | ICD-10-CM | POA: Diagnosis not present

## 2022-03-17 DIAGNOSIS — I129 Hypertensive chronic kidney disease with stage 1 through stage 4 chronic kidney disease, or unspecified chronic kidney disease: Secondary | ICD-10-CM | POA: Diagnosis not present

## 2022-03-17 DIAGNOSIS — N1831 Chronic kidney disease, stage 3a: Secondary | ICD-10-CM | POA: Diagnosis not present

## 2022-03-17 DIAGNOSIS — E213 Hyperparathyroidism, unspecified: Secondary | ICD-10-CM | POA: Diagnosis not present

## 2022-04-05 NOTE — Telephone Encounter (Signed)
Appt 04/07/22

## 2022-04-07 ENCOUNTER — Ambulatory Visit (INDEPENDENT_AMBULATORY_CARE_PROVIDER_SITE_OTHER): Payer: Medicare PPO

## 2022-04-07 DIAGNOSIS — M810 Age-related osteoporosis without current pathological fracture: Secondary | ICD-10-CM

## 2022-04-07 MED ORDER — DENOSUMAB 60 MG/ML ~~LOC~~ SOSY
60.0000 mg | PREFILLED_SYRINGE | Freq: Once | SUBCUTANEOUS | Status: AC
  Administered 2022-04-07: 60 mg via SUBCUTANEOUS

## 2022-04-07 NOTE — Progress Notes (Signed)
Prolia given and  tolerated well

## 2022-04-11 ENCOUNTER — Other Ambulatory Visit: Payer: Self-pay | Admitting: Internal Medicine

## 2022-05-07 NOTE — Telephone Encounter (Signed)
Last Prolia inj 04/07/22 Next Prolia inj due 10/08/22

## 2022-05-12 ENCOUNTER — Other Ambulatory Visit: Payer: Self-pay | Admitting: Internal Medicine

## 2022-05-24 IMAGING — DX DG CHEST 2V
2 series · 2 of 2 positions shown · non-contrast
Comparison: 12/05/2020.  03/21/2018.  Chest CT dated 01/17/2013.

CLINICAL DATA: Follow-up pneumonia.  COPD

EXAM:
CHEST - 2 VIEW

[chest pa]
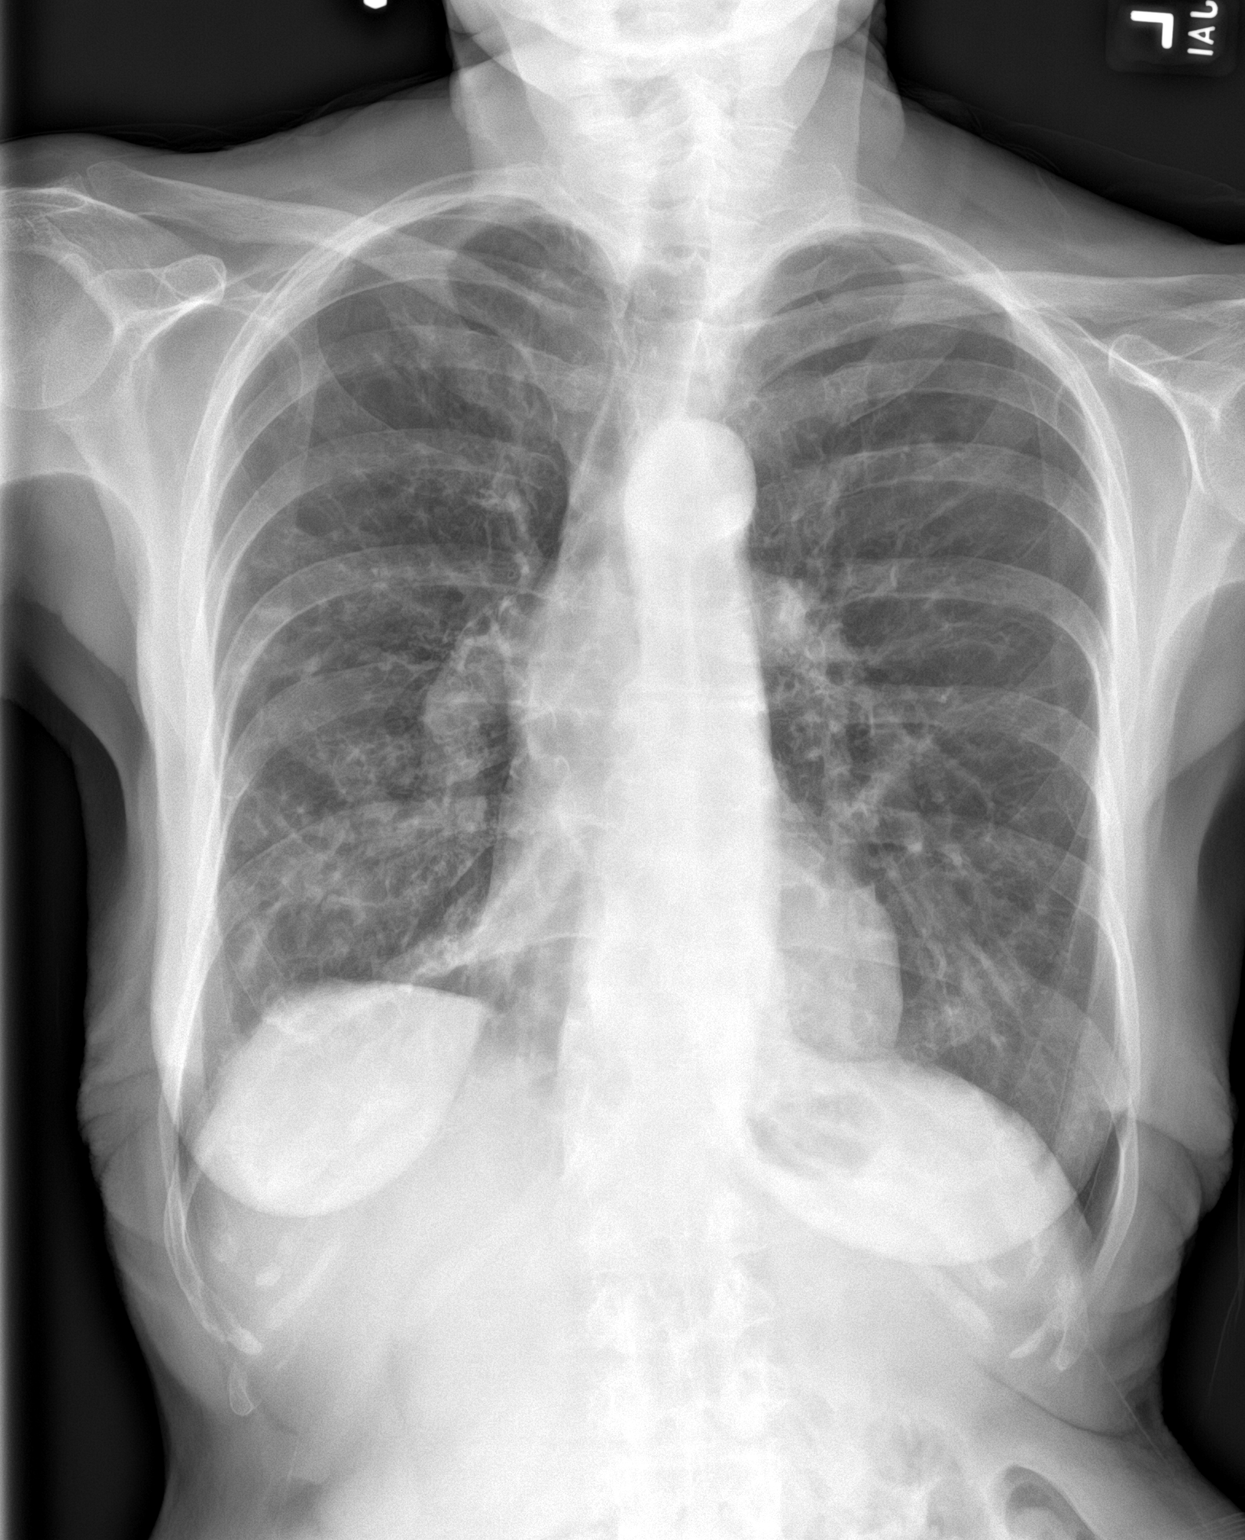

[chest lat]
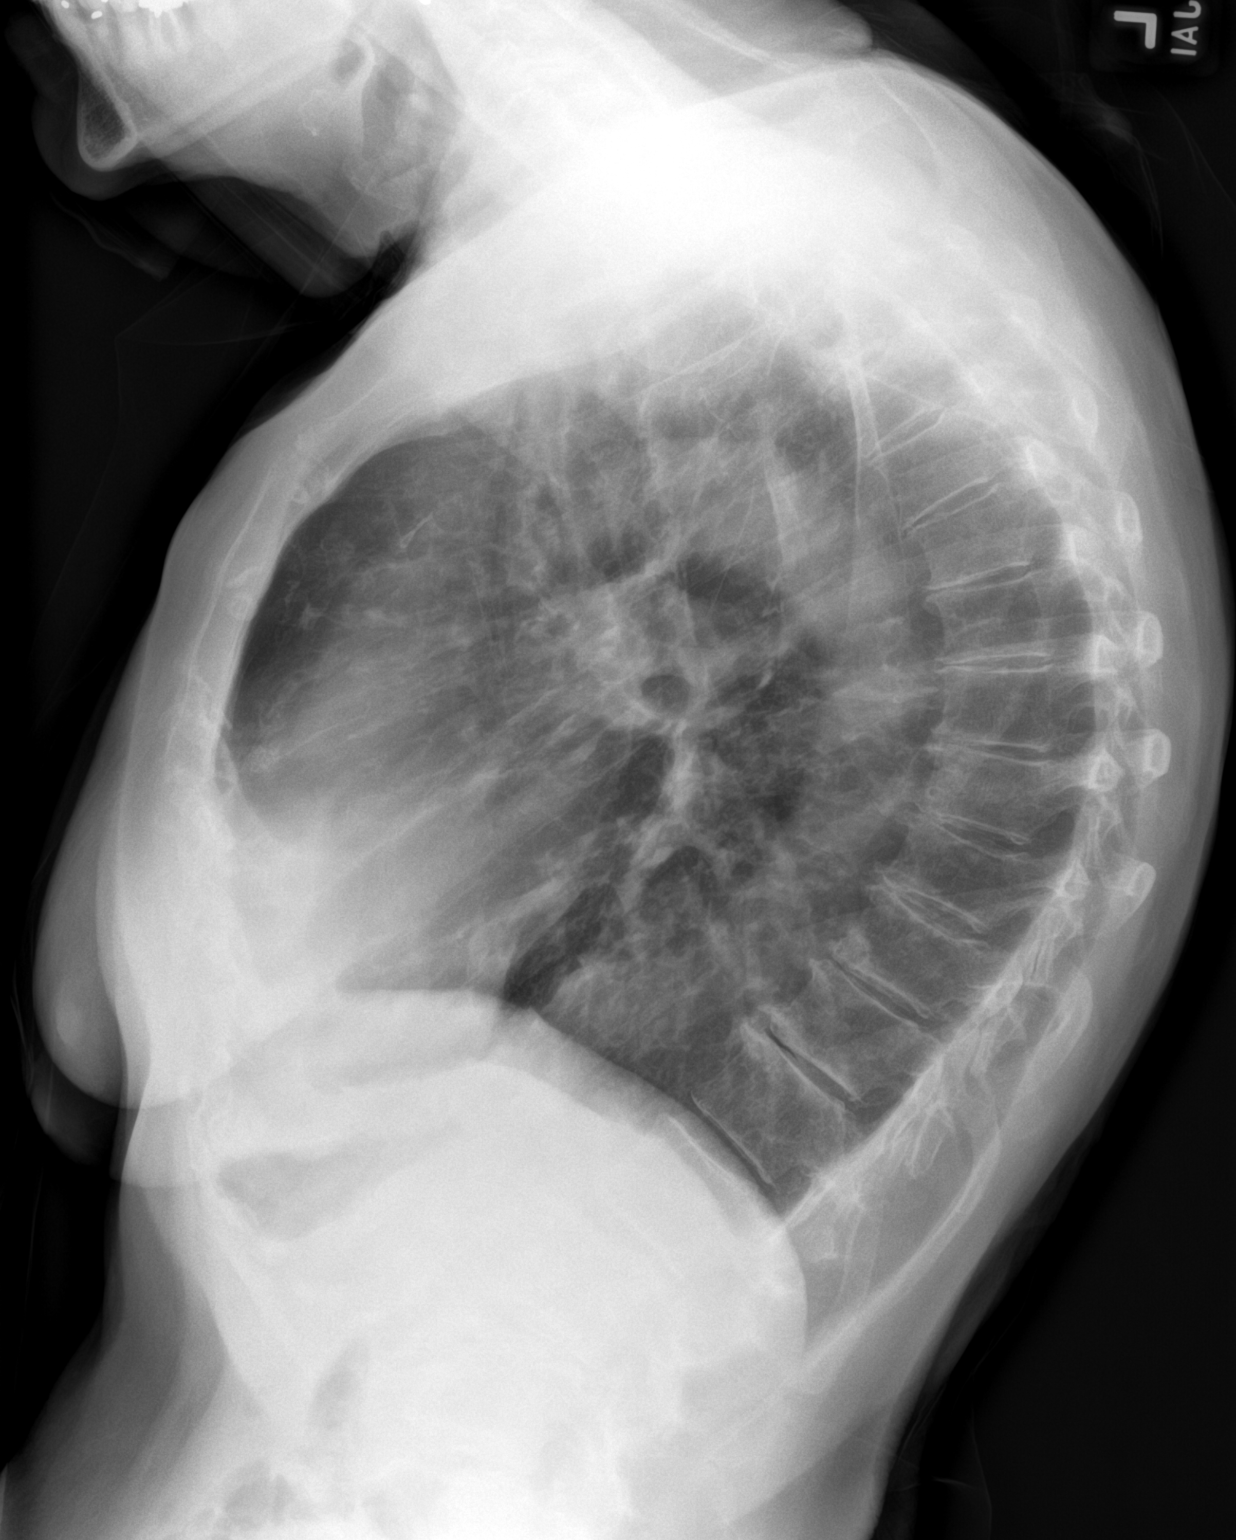

[2 of 2 positions shown; findings below may reference images not displayed]

FINDINGS: Stable mild nodularity and possible airspace opacity in the right
lung. Similar changes on the previous chest CT in 6818. These are
more prominent than changes seen on 03/21/2018. Clear left lung.
Normal sized heart. Mild thoracic spine degenerative changes.
IMPRESSION: Stable probable chronic changes in the right lung without interval
superimposed acute infection. If the patient is currently
symptomatic, a chest CT with contrast would be helpful for further
characterization.

## 2022-05-26 ENCOUNTER — Ambulatory Visit: Payer: Medicare PPO | Admitting: Family Medicine

## 2022-05-26 ENCOUNTER — Encounter: Payer: Self-pay | Admitting: Family Medicine

## 2022-05-26 VITALS — BP 167/87 | HR 67 | Ht 61.0 in | Wt 128.5 lb

## 2022-05-26 DIAGNOSIS — G43809 Other migraine, not intractable, without status migrainosus: Secondary | ICD-10-CM

## 2022-05-26 MED ORDER — AIMOVIG 70 MG/ML ~~LOC~~ SOAJ: SUBCUTANEOUS | 3 refills | Status: DC

## 2022-05-26 NOTE — Progress Notes (Signed)
PATIENT: Joy Martin DOB: 07/20/1941  REASON FOR VISIT: follow up HISTORY FROM: patient  Chief Complaint  Patient presents with   Follow-up    RM 1, alone. Last seen 11/19/20. Needed yearly appt for med refills. (Topiramate, Aimovig). Does not feel she is getting migraines. Dr. Leta Baptist thought she was and this is why she was placed on these medications. Gets headaches. Feels caused by sore muscles/tight muscles. Stopped topamax about 1-2 weeks ago because she could not get refill. Last Aimovig 05/2022.      HISTORY OF PRESENT ILLNESS:  05/26/22 ALL: Joy Martin returns for follow up for headaches. She was last seen 11/2020 and continued topiramate '50mg'$  BID and Amovig '70mg'$  monthly. She reports doing well. She feels that headaches are well managed. She does report not taking topiramate for the past two weeks due to being out of refills. She would like to discontinue this medication. She denies any migrainous headaches. She does have tension style headaches anywhere from 7-20 times a month. She takes two Excedrin which will abort headaches. She is followed by PCP regularly. She lives alone. She has a great neighbors that support her. Sister in [place]. She drives without difficulty.   11/19/2020 ALL:  She returns for follow-up on headaches. She continues topiramate 50 mg BID and Aimovig 70 mg monthly. She feels that, overall, headaches have improved. She may have 1-2 headaches per week. She does continue to have significant joint and muscle pain. She has neck pain that radiates to her head. NO radicular symptoms. She is trying to use Tylenol for pain management but she feels that it makes her headaches worse and does not work as well as Excedrin. She is followed closely by PCP. She is not interested in PT or pain management options at this time.  Marland Kitchen History (copied from previous notes) 11/19/2019 Joy Martin is a 81 y.o. female here today for follow up for migraines. She continues Amovig  '70mg'$  monthly and topiramate '50mg'$  twice daily. Headaches continue to improve. She has mild tightness of the top of her head 4-5 times a week. She has not had a migraine recently. She continues Excedrin regularly. She usually takes two tablets every day but tries to "skip days" when she can. She reports taking Excedrin for joint pain. She is followed closely by PCP and nephrology. BP and creatinine have been normal recently. She has follow up with both in the upcoming weeks. She is seeing ophthalmology this week. She is not interested in sleep study.   HISTORY: (copied from my note on 05/16/2019)  Joy Martin is a 81 y.o. female here today for follow up of chronic migraines. She reports that her headaches have decreased in frequency. She is tolerating topiramate and Amovig well. She does continue daily use of Excedrin but has decreased the frequency. She is averaging two tablets daily. She feels that she has 5-6 headaches a week. She has not pursued sleep referral. She does not wish to have a sleep study at this time. She does not feel headaches are debilitating and feels fairly well. Left shoulder pain has improved.      HISTORY: (copied from [place] note on 11/15/2018)   Joy Martin is a 81 year old female with a history of headaches.  She returns today for follow-up.  She is been taking AImovig last 5 months.  She does not notice any change in her headaches.  She continues to have daily headaches.  She states that she typically wakes  up with a headache and then the headache will progress to either a true migraine whereas on the right side of the head.  She typically will take Excedrin Migraine.  She reports that Maxalt has given her no benefit.  She states sometimes her headaches will progress to neck pain and then into a migraine.  She continues to take Excedrin Migraine daily.  She states that she has tried to limit her dose but that is the only thing that helps with her headaches.  She does  state that she tends to fall asleep during the day.  She does not plan to take naps but does fall asleep if she sits down.  She does not have anyone that lives with her.  She is unsure if she snores.  She does state that she has to get up multiple times at night for urinary issues.  She returns today for evaluation.   HISTORY UPDATE (04/18/18, VRP): Since last visit, doing about the same. Tolerating meds. Still with daily headaches (right parietal soreness and burned sensation, sometimes pounding) and daily Excedrin usage. Left shoulder pain improving. No alleviating or aggravating factors.    UPDATE 04/12/17: Since last visit, HA are improving. On TPX '50mg'$  twice a day. Continues on excedrin 2-4 tabs per day. Now with more left shoulder pain, radiating to neck and head. Seeing sports medicine clinic and trying home exercises.   UPDATE 12/07/16: Since last visit visit, continues with daily headaches. Has slightly reduced excedrin to 2-4 tabs per day. Tolerating TPX '25mg'$  daily.    PRIOR HPI (08/27/16): 81 year old right-handed female here for evaluation of headaches. Around age 56 years old patient had onset of right-sided headaches with right eye pain, throbbing, nausea, proceeded by visual disturbance and aura. Patient was diagnosed with migraine headaches. During factors include menstrual cycle and red wine. She averaged 2-3 headaches per month each lasting 1-2 days. Patient was evaluated by the headache clinic in the past and tried nadolol, Imitrex, over-the-counter medications. By age 47 years old patient was going through menopause and her migraine headaches subsided. Over many years patient has also had lower grade dull sore and intermittent stabbing headaches sometimes on the right side, sometimes on the top of her head. No other associated factors. These been going on for at least 10-20 years. Patient has gradually built up to using 2-6 tablets of Excedrin over-the-counter on a daily basis. This is been  going on for many many years. Patient has tried weaning off in the past but this caused rebound headaches. Patient also was taking Fioricet 20 tablets per month for many years. Patient now establish with a new PCP who is reviewing medication list and advising patient to reduce medication overusage which may be triggering analgesic overuse headache and rebound headache. Patient referred to me for further evaluation consideration of prophylactic headache therapy.   REVIEW OF SYSTEMS: Out of a complete 14 system review of symptoms, the patient complains only of the following symptoms, headaches, joint pain, neck pain  and all other reviewed systems are negative.  ALLERGIES: Allergies  Allergen Reactions   Allegra [Fexofenadine Hcl] Other (See Comments)    Headache, "took the pill; I got flu symptoms"   Prilosec [Omeprazole]     Fecal incontince   Ranitidine     Fecal incontince    HOME MEDICATIONS: Outpatient Medications Prior to Visit  Medication Sig Dispense Refill   amLODipine (NORVASC) 5 MG tablet TAKE 1 TABLET BY MOUTH EVERY DAY 90 tablet 0  aspirin-acetaminophen-caffeine (EXCEDRIN MIGRAINE) 250-250-65 MG tablet Take 2 tablets by mouth every 6 (six) hours as needed for headache.     Cranberry 500 MG CAPS Take 500 mg by mouth daily.     cycloSPORINE (RESTASIS) 0.05 % ophthalmic emulsion 1 drop 2 (two) times daily.     denosumab (PROLIA) 60 MG/ML SOSY injection Inject 60 mg into the skin every 6 (six) months.     esomeprazole (NEXIUM) 40 MG capsule TAKE 1 CAPSULE (40 MG TOTAL) BY MOUTH DAILY AT 12 NOON. 90 capsule 1   famotidine (PEPCID) 40 MG tablet TAKE 1 TABLET BY MOUTH EVERY DAY 90 tablet 1   Flaxseed, Linseed, (FLAXSEED OIL PO) Take 2 tablets by mouth daily.     fluticasone (FLONASE) 50 MCG/ACT nasal spray USE 1 SPRAY IN EACH NOSTRIL DAILY 48 mL 1   Fluticasone-Umeclidin-Vilant (TRELEGY ELLIPTA) 100-62.5-25 MCG/ACT AEPB Inhale 1 puff into the lungs daily. 2 each 0   nadolol  (CORGARD) 40 MG tablet TAKE 1 TABLET BY MOUTH TWICE A DAY 180 tablet 1   simvastatin (ZOCOR) 40 MG tablet TAKE 1 TABLET BY MOUTH EVERYDAY AT BEDTIME 90 tablet 0   valsartan (DIOVAN) 320 MG tablet TAKE 1 TABLET BY MOUTH EVERY DAY 90 tablet 1   Erenumab-aooe (AIMOVIG) 70 MG/ML SOAJ INJECT '70MG'$  INTO THE SKIN EVERY 30 DAYS 3 mL 0   topiramate (TOPAMAX) 50 MG tablet Take 1 tablet (50 mg total) by mouth 2 (two) times daily. 180 tablet 0   No facility-administered medications prior to visit.    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   removed right upper lip/perinostril [name] ([facility])   Chronic sinusitis    follows with ENT for same   CKD (chronic kidney disease) 2018   COPD (chronic obstructive pulmonary disease) ([place]) dx 2013   GOLD 1, follows with pulm for same   Episodic tension type headache    EXOGENOUS OBESITY    GERD    HYPERLIPIDEMIA    Hypertension    Migraines 07/10/2012   "over; last one was @ age 65"   OSTEOPENIA    Pneumonia 2018-2019   x 2    PAST SURGICAL HISTORY: Past Surgical History:  Procedure Laterality Date   APPENDECTOMY  07/10/2012   BREAST CYST ASPIRATION  ?1980's   right   EYE SURGERY     both eyes   KNEE ARTHROSCOPY  1970's or 1980's   right; torn cartilage   LAPAROSCOPIC APPENDECTOMY  07/10/2012   Procedure: APPENDECTOMY LAPAROSCOPIC;  Surgeon: Harl Bowie, MD;  Location: [facility];  Service: General;  Laterality: N/A;   MOHS SURGERY  01/2016   nose   MOLE REMOVAL  1957   "my back"   SKIN CANCER EXCISION     "2 on my back; 2 on my face"   TONSILLECTOMY AND ADENOIDECTOMY  1946   WISDOM TOOTH EXTRACTION  1959    FAMILY HISTORY: Family History  Problem Relation Age of Onset   Prostate cancer Father    Cancer Father        prostate   Cancer Mother        waldenstruns microgobular anemia    Hyperparathyroidism Mother    Alcohol abuse Other    Heart disease Other    Hypertension Other    Diabetes Other  Pancreatic cancer Other    Hyperparathyroidism Sister    Cancer Maternal Grandfather        pancreatic    SOCIAL HISTORY: Social History   Socioeconomic History   Marital status: Single    Spouse name: Not on file   Number of children: 0   Years of education: 16   Highest education level: Not on file  Occupational History    Comment: retired Pharmacist, hospital, 5th grade  Tobacco Use   Smoking status: Former    Packs/day: 0.75    Years: 20.00    Total pack years: 15.00    Types: Cigarettes    Quit date: 07/25/1982    Years since quitting: 39.8   Smokeless tobacco: Never   Tobacco comments:    07/10/2012 "stopped smoking cigarettes 1980's"  Substance and Sexual Activity   Alcohol use: Yes    Comment: 07/10/2012 "glass of wine 1-2X/yr"   Drug use: No   Sexual activity: Never  Other Topics Concern   Not on file  Social History Narrative   Retired Pharmacist, hospital, 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   Caffeine- coffee 1 cup, some Coke   Social Determinants of Health   Financial Resource Strain: Low Risk  (03/12/2022)   Overall Financial Resource Strain (CARDIA)    Difficulty of Paying Living Expenses: Not hard at all  Food Insecurity: No Food Insecurity (03/12/2022)   Hunger Vital Sign    Worried About Running Out of Food in the Last Year: Never true    Tensed in the Last Year: Never true  Transportation Needs: No Transportation Needs (03/12/2022)   PRAPARE - Hydrologist (Medical): No    Lack of Transportation (Non-Medical): No  Physical Activity: Inactive (03/12/2022)   Exercise Vital Sign    Days of Exercise per Week: 0 days    Minutes of Exercise per Session: 0 min  Stress: No Stress Concern Present (03/12/2022)   [place]    Feeling of Stress : Not at all  Social Connections:  Moderately Integrated (03/12/2022)   Social Connection and Isolation Panel [NHANES]    Frequency of Communication with Friends and Family: Three times a week    Frequency of Social Gatherings with Friends and Family: Three times a week    Attends Religious Services: More than 4 times per year    Active Member of Clubs or Organizations: Yes    Attends Archivist Meetings: More than 4 times per year    Marital Status: Never married  Intimate Partner Violence: Not At Risk (03/12/2022)   Humiliation, Afraid, Rape, and Kick questionnaire    Fear of Current or Ex-Partner: No    Emotionally Abused: No    Physically Abused: No    Sexually Abused: No      PHYSICAL EXAM  Vitals:   05/26/22 1012  BP: (!) 167/87  Pulse: 67  Weight: 128 lb 8 oz (58.3 kg)  Height: '5\' 1"'$  (1.549 m)    Body mass index is 24.28 kg/m.  Generalized: Well developed, in no acute distress  Cardiology: normal rate and rhythm, no murmur noted Respiratory: clear to auscultation bilaterally Neurological examination  Mentation: Alert oriented to time, place, history taking. Follows all commands speech and language fluent Cranial nerve II-XII: Pupils were equal round reactive to light. Extraocular movements were full, visual field were full on confrontational test. Facial sensation and strength were normal. Head turning and shoulder shrug  were normal and symmetric. Motor: The motor testing reveals 5 over 5 strength of all 4 extremities. Good symmetric motor tone is noted throughout.  Sensory: Sensory testing is intact to soft touch on all 4 extremities. No evidence of extinction is noted.  Gait and station: Gait is normal.   DIAGNOSTIC DATA (LABS, IMAGING, TESTING) - I reviewed patient records, labs, notes, testing and imaging myself where available.      No data to display           Lab Results  Component Value Date   WBC 8.7 12/31/2021   HGB 13.0 12/31/2021   HCT 41.1 12/31/2021   MCV 93.1  12/31/2021   PLT 246.0 12/31/2021      Component Value Date/Time   NA 142 12/31/2021 1052   K 5.1 12/31/2021 1052   CL 109 12/31/2021 1052   CO2 29 12/31/2021 1052   GLUCOSE 90 12/31/2021 1052   BUN 26 (H) 12/31/2021 1052   CREATININE 1.21 (H) 12/31/2021 1052   CREATININE 1.00 (H) 06/03/2020 1153   CALCIUM 11.0 (H) 12/31/2021 1052   PROT 7.5 12/31/2021 1052   ALBUMIN 4.1 12/31/2021 1052   AST 20 12/31/2021 1052   ALT 13 12/31/2021 1052   ALKPHOS 62 12/31/2021 1052   BILITOT 0.4 12/31/2021 1052   GFRNONAA 53 (L) 07/10/2012 1120   GFRAA 61 (L) 07/10/2012 1120   Lab Results  Component Value Date   CHOL 149 09/04/2021   HDL 60.90 09/04/2021   LDLCALC 62 09/04/2021   LDLDIRECT 143.2 12/19/2007   TRIG 132.0 09/04/2021   CHOLHDL 2 09/04/2021   Lab Results  Component Value Date   HGBA1C 6.3 12/31/2021   No results found for: "VITAMINB12" Lab Results  Component Value Date   TSH 2.26 12/05/2020     ASSESSMENT AND PLAN 81 y.o. year old female  has a past medical history of ALLERGIC RHINITIS, CHRONIC, ANEMIA, Arthritis, BCC (basal cell carcinoma), lip (04/2015), Chronic sinusitis, CKD (chronic kidney disease) (2018), COPD (chronic obstructive pulmonary disease) ([place]) (dx 2013), Episodic tension type headache, EXOGENOUS OBESITY, GERD, HYPERLIPIDEMIA, Hypertension, Migraines (07/10/2012), OSTEOPENIA, and Pneumonia (2018-2019). here with     ICD-10-CM   1. Other migraine without status migrainosus, not intractable  G43.809       Joy Martin is doing well. She feels that headaches are well managed at this time. She will continue Amovig. I will discontinue topiramate for now, per her request. We have discussed concerns of neck pain and use of Excedrin. She is not interested in treatment options at this time. She was encouraged to continue discussion with PCP if needed. I have encouraged her to decrease use of Excedrin. Creatinine elevated in 01/2022. Tylenol may be abortive options. She will  continue healthy lifestyle habits. She may follow up with PCP for refills, if willing. Otherwise, she will return to see me in 1 year. She verbalizes understanding and agreement with this plan.    No orders of the defined types were placed in this encounter.    Meds ordered this encounter  Medications   Erenumab-aooe (AIMOVIG) 70 MG/ML SOAJ    Sig: INJECT '70MG'$  INTO THE SKIN EVERY 30 DAYS    Dispense:  3 mL    Refill:  3    Order Specific Question:   Supervising Provider    Answer:   Melvenia Beam [[id number]]      SKA JGOTL, FNP-C 05/26/2022, 10:53 AM [facility] [address], [place], [place] [address] [phone])  [phone] 

## 2022-05-26 NOTE — Patient Instructions (Addendum)
Below is our plan:  We will discontinue topiramate for now. Continue Amovig injections every 30 days. Try to cut back on Excedrin usage. Use Tylenol as needed. Avoid taking any abortive medication more than 2 times a week.   Call me if headaches worsen and we can consider adding a dose of topiramate.   Please make sure you are staying well hydrated. I recommend 50-60 ounces daily. Well balanced diet and regular exercise encouraged. Consistent sleep schedule with 6-8 hours recommended.   Please continue follow up with care team as directed.   Follow up with me in 1 year   You may receive a survey regarding today's visit. I encourage you to leave honest feed back as I do use this information to improve patient care. Thank you for seeing me today!

## 2022-05-28 ENCOUNTER — Other Ambulatory Visit: Payer: Self-pay | Admitting: Internal Medicine

## 2022-06-11 ENCOUNTER — Other Ambulatory Visit: Payer: Self-pay | Admitting: Internal Medicine

## 2022-06-30 ENCOUNTER — Encounter: Payer: Self-pay | Admitting: Internal Medicine

## 2022-06-30 NOTE — Progress Notes (Unsigned)
Subjective:    Patient ID: Joy Martin, female    DOB: November 22, 1940, 81 y.o.   MRN: [id number]      HPI Joy Martin is here for a Physical exam.    Change simvastatin to lipitor 20 ( on norvasc)  COPD - SOB - saw pulm started on trelegy (wixela held) SOB improved a little.  She does like that this inhaler is once a day instead of twice a day.  She is concerned about the steroid effect on her bones.    Noticed a couple of weeks ago a bulge or lump in the left groin/vulvar region- sometimes it moves.  No pain.    Medications and allergies reviewed with patient and updated if appropriate.  Current Outpatient Medications on File Prior to Visit  Medication Sig Dispense Refill   amLODipine (NORVASC) 5 MG tablet TAKE 1 TABLET BY MOUTH EVERY DAY 90 tablet 0   aspirin-acetaminophen-caffeine (EXCEDRIN MIGRAINE) 250-250-65 MG tablet Take 2 tablets by mouth every 6 (six) hours as needed for headache.     Cranberry 500 MG CAPS Take 500 mg by mouth daily.     cycloSPORINE (RESTASIS) 0.05 % ophthalmic emulsion 1 drop 2 (two) times daily.     denosumab (PROLIA) 60 MG/ML SOSY injection Inject 60 mg into the skin every 6 (six) months.     Erenumab-aooe (AIMOVIG) 70 MG/ML SOAJ INJECT '70MG'$  INTO THE SKIN EVERY 30 DAYS 3 mL 3   esomeprazole (NEXIUM) 40 MG capsule TAKE 1 CAPSULE (40 MG TOTAL) BY MOUTH DAILY AT 12 NOON. 90 capsule 1   famotidine (PEPCID) 40 MG tablet Take 1 tablet (40 mg total) by mouth daily. Annual appt is due must see provider for future refills 30 tablet 0   Flaxseed, Linseed, (FLAXSEED OIL PO) Take 2 tablets by mouth daily.     fluticasone (FLONASE) 50 MCG/ACT nasal spray USE 1 SPRAY IN EACH NOSTRIL DAILY 48 mL 1   Fluticasone-Umeclidin-Vilant (TRELEGY ELLIPTA) 100-62.5-25 MCG/ACT AEPB Inhale 1 puff into the lungs daily. 2 each 0   nadolol (CORGARD) 40 MG tablet TAKE 1 TABLET BY MOUTH TWICE A DAY 180 tablet 1   simvastatin (ZOCOR) 40 MG tablet TAKE 1 TABLET BY MOUTH EVERYDAY AT  BEDTIME Annual appt is due must see provider for future refills 30 tablet 0   valsartan (DIOVAN) 320 MG tablet Take 1 tablet (320 mg total) by mouth daily. Annual appt is due must see provider for future refills 30 tablet 0   WIXELA INHUB 100-50 MCG/ACT AEPB Inhale 1 puff into the lungs 2 (two) times daily.     [DISCONTINUED] Calcium Carbonate (CALCIUM 500 PO) Take by mouth.       No current facility-administered medications on file prior to visit.    Review of Systems  Constitutional:  Negative for fever.  Eyes:  Negative for visual disturbance.  Respiratory:  Positive for shortness of breath (a little better). Negative for cough and wheezing.   Cardiovascular:  Negative for chest pain, palpitations and leg swelling.  Gastrointestinal:  Negative for abdominal pain, blood in stool, constipation, diarrhea and nausea.  Genitourinary:  Negative for dysuria.  Musculoskeletal:  Positive for arthralgias and back pain (mild - goes away with moving around).  Skin:  Negative for rash.  Neurological:  Negative for light-headedness and headaches.  Psychiatric/Behavioral:  Negative for dysphoric mood. The patient is not nervous/anxious.        Objective:   Vitals:   07/01/22 1013  BP: (!) 142/72  Pulse: 63  Temp: 97.8 F (36.6 C)  SpO2: 97%   Filed Weights   07/01/22 1013  Weight: 132 lb (59.9 kg)   Body mass index is 24.94 kg/m.  BP Readings from Last 3 Encounters:  07/01/22 (!) 142/72  05/26/22 (!) 167/87  02/12/22 130/84    Wt Readings from Last 3 Encounters:  07/01/22 132 lb (59.9 kg)  05/26/22 128 lb 8 oz (58.3 kg)  02/12/22 113 lb (51.3 kg)       Physical Exam Constitutional: She appears well-developed and well-nourished. No distress.  HENT:  Head: Normocephalic and atraumatic.  Right Ear: External ear normal. Normal ear canal and TM Left Ear: External ear normal.  Normal ear canal and TM Mouth/Throat: Oropharynx is clear and moist.  Eyes: Conjunctivae normal.   Neck: Neck supple. No tracheal deviation present. No thyromegaly present.  No carotid bruit  Cardiovascular: Normal rate, regular rhythm and normal heart sounds.   No murmur heard.  No edema. Pulmonary/Chest: Effort normal and breath sounds normal. No respiratory distress. She has no wheezes. She has no rales.  Breast: deferred   Abdominal: Soft. She exhibits no distension. There is no tenderness.  GU: No palpable lump or bulge in left groin/vulva region Lymphadenopathy: She has no cervical adenopathy.  Skin: Skin is warm and dry. She is not diaphoretic.  Psychiatric: She has a normal mood and affect. Her behavior is normal.    Lab Results  Component Value Date   WBC 8.7 12/31/2021   HGB 13.0 12/31/2021   HCT 41.1 12/31/2021   PLT 246.0 12/31/2021   GLUCOSE 90 12/31/2021   CHOL 149 09/04/2021   TRIG 132.0 09/04/2021   HDL 60.90 09/04/2021   LDLDIRECT 143.2 12/19/2007   LDLCALC 62 09/04/2021   ALT 13 12/31/2021   AST 20 12/31/2021   NA 142 12/31/2021   K 5.1 12/31/2021   CL 109 12/31/2021   CREATININE 1.21 (H) 12/31/2021   BUN 26 (H) 12/31/2021   CO2 29 12/31/2021   TSH 2.26 12/05/2020   HGBA1C 6.3 12/31/2021         Assessment & Plan:   Physical exam: Screening blood work  ordered Exercise not regular Weight normal Substance abuse  none   Reviewed recommended immunizations.   Health Maintenance  Topic Date Due   COVID-19 Vaccine (6 - Pfizer risk series) 10/16/2021   DEXA SCAN  06/12/2022   INFLUENZA VACCINE  06/08/2022   TETANUS/TDAP  09/04/2022 (Originally 12/18/2017)   Pneumonia Vaccine 67+ Years old  Completed   Zoster Vaccines- Shingrix  Completed   HPV VACCINES  Aged Out     Encouraged healthy eating-making sure is getting the protein and vegetables which she may not do Stressed the importance of regular activity/exercise     See Problem List for Assessment and Plan of chronic medical problems.

## 2022-06-30 NOTE — Patient Instructions (Addendum)
Blood work was ordered.   Bone density ordered    Medications changes include : When you finish the simvastatin you have-start atorvastatin 20 mg daily   Your prescription(s) have been sent to your pharmacy.     Return in about 6 months (around 01/01/2023) for follow up.   Health Maintenance, Female Adopting a healthy lifestyle and getting preventive care are important in promoting health and wellness. Ask your health care provider about: The right schedule for you to have regular tests and exams. Things you can do on your own to prevent diseases and keep yourself healthy. What should I know about diet, weight, and exercise? Eat a healthy diet  Eat a diet that includes plenty of vegetables, fruits, low-fat dairy products, and lean protein. Do not eat a lot of foods that are high in solid fats, added sugars, or sodium. Maintain a healthy weight Body mass index (BMI) is used to identify weight problems. It estimates body fat based on height and weight. Your health care provider can help determine your BMI and help you achieve or maintain a healthy weight. Get regular exercise Get regular exercise. This is one of the most important things you can do for your health. Most adults should: Exercise for at least 150 minutes each week. The exercise should increase your heart rate and make you sweat (moderate-intensity exercise). Do strengthening exercises at least twice a week. This is in addition to the moderate-intensity exercise. Spend less time sitting. Even light physical activity can be beneficial. Watch cholesterol and blood lipids Have your blood tested for lipids and cholesterol at 83 years of age, then have this test every 5 years. Have your cholesterol levels checked more often if: Your lipid or cholesterol levels are high. You are older than 83 years of age. You are at high risk for heart disease. What should I know about cancer screening? Depending on your health  history and family history, you may need to have cancer screening at various ages. This may include screening for: Breast cancer. Cervical cancer. Colorectal cancer. Skin cancer. Lung cancer. What should I know about heart disease, diabetes, and high blood pressure? Blood pressure and heart disease High blood pressure causes heart disease and increases the risk of stroke. This is more likely to develop in people who have high blood pressure readings or are overweight. Have your blood pressure checked: Every 3-5 years if you are 83-61 years of age. Every year if you are 83 years old or older. Diabetes Have regular diabetes screenings. This checks your fasting blood sugar level. Have the screening done: Once every three years after age 15 if you are at a normal weight and have a low risk for diabetes. More often and at a younger age if you are overweight or have a high risk for diabetes. What should I know about preventing infection? Hepatitis B If you have a higher risk for hepatitis B, you should be screened for this virus. Talk with your health care provider to find out if you are at risk for hepatitis B infection. Hepatitis C Testing is recommended for: Everyone born from [birth year] through 1967. Anyone with known risk factors for hepatitis C. Sexually transmitted infections (STIs) Get screened for STIs, including gonorrhea and chlamydia, if: You are sexually active and are younger than 83 years of age. You are older than 83 years of age and your health care provider tells you that you are at risk for this type of infection. Your  sexual activity has changed since you were last screened, and you are at increased risk for chlamydia or gonorrhea. Ask your health care provider if you are at risk. Ask your health care provider about whether you are at high risk for HIV. Your health care provider may recommend a prescription medicine to help prevent HIV infection. If you choose to take medicine to  prevent HIV, you should first get tested for HIV. You should then be tested every 3 months for as long as you are taking the medicine. Pregnancy If you are about to stop having your period (premenopausal) and you may become pregnant, seek counseling before you get pregnant. Take 400 to 800 micrograms (mcg) of folic acid every day if you become pregnant. Ask for birth control (contraception) if you want to prevent pregnancy. Osteoporosis and menopause Osteoporosis is a disease in which the bones lose minerals and strength with aging. This can result in bone fractures. If you are 83 years old or older, or if you are at risk for osteoporosis and fractures, ask your health care provider if you should: Be screened for bone loss. Take a calcium or vitamin D supplement to lower your risk of fractures. Be given hormone replacement therapy (HRT) to treat symptoms of menopause. Follow these instructions at home: Alcohol use Do not drink alcohol if: Your health care provider tells you not to drink. You are pregnant, may be pregnant, or are planning to become pregnant. If you drink alcohol: Limit how much you have to: 0-1 drink a day. Know how much alcohol is in your drink. In the [place] one drink equals one 12 oz bottle of beer (355 mL), one 5 oz glass of wine (148 mL), or one 1 oz glass of hard liquor (44 mL). Lifestyle Do not use any products that contain nicotine or tobacco. These products include cigarettes, chewing tobacco, and vaping devices, such as e-cigarettes. If you need help quitting, ask your health care provider. Do not use street drugs. Do not share needles. Ask your health care provider for help if you need support or information about quitting drugs. General instructions Schedule regular health, dental, and eye exams. Stay current with your vaccines. Tell your health care provider if: You often feel depressed. You have ever been abused or do not feel safe at  home. Summary Adopting a healthy lifestyle and getting preventive care are important in promoting health and wellness. Follow your health care provider's instructions about healthy diet, exercising, and getting tested or screened for diseases. Follow your health care provider's instructions on monitoring your cholesterol and blood pressure. This information is not intended to replace advice given to you by your health care provider. Make sure you discuss any questions you have with your health care provider. Document Revised: 03/16/2021 Document Reviewed: 03/16/2021 Elsevier Patient Education  [place].

## 2022-07-01 ENCOUNTER — Ambulatory Visit (INDEPENDENT_AMBULATORY_CARE_PROVIDER_SITE_OTHER): Payer: Medicare PPO | Admitting: Internal Medicine

## 2022-07-01 VITALS — BP 142/72 | HR 63 | Temp 97.8°F | Ht 61.0 in | Wt 132.0 lb

## 2022-07-01 DIAGNOSIS — I10 Essential (primary) hypertension: Secondary | ICD-10-CM

## 2022-07-01 DIAGNOSIS — E210 Primary hyperparathyroidism: Secondary | ICD-10-CM | POA: Diagnosis not present

## 2022-07-01 DIAGNOSIS — Z0000 Encounter for general adult medical examination without abnormal findings: Secondary | ICD-10-CM | POA: Diagnosis not present

## 2022-07-01 DIAGNOSIS — G43809 Other migraine, not intractable, without status migrainosus: Secondary | ICD-10-CM

## 2022-07-01 DIAGNOSIS — M810 Age-related osteoporosis without current pathological fracture: Secondary | ICD-10-CM

## 2022-07-01 DIAGNOSIS — R7303 Prediabetes: Secondary | ICD-10-CM

## 2022-07-01 DIAGNOSIS — N1831 Chronic kidney disease, stage 3a: Secondary | ICD-10-CM | POA: Diagnosis not present

## 2022-07-01 DIAGNOSIS — E785 Hyperlipidemia, unspecified: Secondary | ICD-10-CM

## 2022-07-01 DIAGNOSIS — K219 Gastro-esophageal reflux disease without esophagitis: Secondary | ICD-10-CM

## 2022-07-01 DIAGNOSIS — R229 Localized swelling, mass and lump, unspecified: Secondary | ICD-10-CM

## 2022-07-01 LAB — COMPREHENSIVE METABOLIC PANEL
ALT: 12 U/L (ref 0–35)
AST: 19 U/L (ref 0–37)
Albumin: 3.9 g/dL (ref 3.5–5.2)
Alkaline Phosphatase: 70 U/L (ref 39–117)
BUN: 26 mg/dL — ABNORMAL HIGH (ref 6–23)
CO2: 29 mEq/L (ref 19–32)
Calcium: 9.9 mg/dL (ref 8.4–10.5)
Chloride: 107 mEq/L (ref 96–112)
Creatinine, Ser: 1.15 mg/dL (ref 0.40–1.20)
GFR: 44.1 mL/min — ABNORMAL LOW (ref >60.00)
Glucose, Bld: 101 mg/dL — ABNORMAL HIGH (ref 70–99)
Potassium: 5 mEq/L (ref 3.5–5.1)
Sodium: 141 mEq/L (ref 135–145)
Total Bilirubin: 0.3 mg/dL (ref 0.2–1.2)
Total Protein: 7.7 g/dL (ref 6.0–8.3)

## 2022-07-01 LAB — CBC WITH DIFFERENTIAL/PLATELET
Basophils Absolute: 0.1 10*3/uL (ref 0.0–0.1)
Basophils Relative: 1 % (ref 0.0–3.0)
Eosinophils Absolute: 0.4 10*3/uL (ref 0.0–0.7)
Eosinophils Relative: 5.3 % — ABNORMAL HIGH (ref 0.0–5.0)
HCT: 40.6 % (ref 36.0–46.0)
Hemoglobin: 13 g/dL (ref 12.0–15.0)
Lymphocytes Relative: 17.7 % (ref 12.0–46.0)
Lymphs Abs: 1.2 10*3/uL (ref 0.7–4.0)
MCHC: 32 g/dL (ref 30.0–36.0)
MCV: 94.6 fl (ref 78.0–100.0)
Monocytes Absolute: 0.6 10*3/uL (ref 0.1–1.0)
Monocytes Relative: 8.4 % (ref 3.0–12.0)
Neutro Abs: 4.7 10*3/uL (ref 1.4–7.7)
Neutrophils Relative %: 67.6 % (ref 43.0–77.0)
Platelets: 232 10*3/uL (ref 150.0–400.0)
RBC: 4.29 Mil/uL (ref 3.87–5.11)
RDW: 14.3 % (ref 11.5–15.5)
WBC: 7 10*3/uL (ref 4.0–10.5)

## 2022-07-01 LAB — LIPID PANEL
Cholesterol: 160 mg/dL (ref 0–200)
HDL: 68.9 mg/dL (ref >39.00)
LDL Cholesterol: 71 mg/dL (ref 0–99)
NonHDL: 90.89
Total CHOL/HDL Ratio: 2
Triglycerides: 100 mg/dL (ref 0.0–149.0)
VLDL: 20 mg/dL (ref 0.0–40.0)

## 2022-07-01 LAB — VITAMIN D 25 HYDROXY (VIT D DEFICIENCY, FRACTURES): VITD: 24.68 ng/mL — ABNORMAL LOW (ref 30.00–100.00)

## 2022-07-01 LAB — HEMOGLOBIN A1C: Hgb A1c MFr Bld: 6.3 % (ref 4.6–6.5)

## 2022-07-01 MED ORDER — ATORVASTATIN CALCIUM 20 MG PO TABS: 20.0000 mg | ORAL_TABLET | Freq: Every day | ORAL | 3 refills | Status: DC

## 2022-07-01 MED ORDER — FAMOTIDINE 40 MG PO TABS: 40.0000 mg | ORAL_TABLET | Freq: Every day | ORAL | 2 refills | Status: DC

## 2022-07-01 MED ORDER — VALSARTAN 320 MG PO TABS: 320.0000 mg | ORAL_TABLET | Freq: Every day | ORAL | 2 refills | Status: DC

## 2022-07-01 NOTE — Assessment & Plan Note (Signed)
Chronic GERD controlled Continue famotidine 40 mg daily, Nexium 40 mg daily

## 2022-07-01 NOTE — Assessment & Plan Note (Signed)
Chronic On Prolia Continue Prolia injections every 6 months long-term DEXA up-to-date due-ordered Continue calcium and vitamin D Check vitamin D level

## 2022-07-01 NOTE — Assessment & Plan Note (Signed)
Chronic Blood pressure well controlled CMP Continue amlodipine 5 mg daily, Diovan 320 mg daily, nadolol 40 mg twice daily

## 2022-07-01 NOTE — Assessment & Plan Note (Signed)
Chronic Check a1c Low sugar / carb diet Stressed regular exercise  

## 2022-07-01 NOTE — Assessment & Plan Note (Signed)
Chronic CMP 

## 2022-07-01 NOTE — Assessment & Plan Note (Signed)
Chronic Regular exercise and healthy diet encouraged Check lipid panel  Continue statin-we will change simvastatin 40 mg to Lipitor 20 mg since she is on amlodipine although has never had any issues with

## 2022-07-01 NOTE — Assessment & Plan Note (Signed)
Acute She noticed the skin lump in her left groin/vulvar region that was nonpainful and mobile On exam I was not able to feel anything abnormal and she was not able to locate this She will monitor for now and if she does feel it or it starts to hurt or grow she will come back for evaluation

## 2022-07-01 NOTE — Assessment & Plan Note (Signed)
Chronic Has seen endocrine in the past - did not see surgery  At that time Cmp, vitamin d level

## 2022-07-01 NOTE — Assessment & Plan Note (Addendum)
Chronic No longer following with neuro - I will manage On Aimovig 70 mg every 30 days, Excedrin Migraine as needed

## 2022-07-06 ENCOUNTER — Other Ambulatory Visit: Payer: Self-pay | Admitting: Internal Medicine

## 2022-07-09 ENCOUNTER — Encounter: Payer: Self-pay | Admitting: Internal Medicine

## 2022-07-22 ENCOUNTER — Telehealth: Payer: Self-pay | Admitting: Pulmonary Disease

## 2022-07-23 MED ORDER — TRELEGY ELLIPTA 100-62.5-25 MCG/ACT IN AEPB: 1.0000 | INHALATION_SPRAY | Freq: Every day | RESPIRATORY_TRACT | 5 refills | Status: DC

## 2022-07-23 NOTE — Telephone Encounter (Signed)
Called and spoke to patient and verified address of where she wanted rx sent to. Nothing further needed

## 2022-08-09 NOTE — Progress Notes (Signed)
Subjective:    Patient ID: Joy Martin, female    DOB: 07-Oct-1941, 81 y.o.   MRN: [id number]      HPI Jamisen is here for  Chief Complaint  Patient presents with   Shortness of Breath    Patient wants to be checked for pneumonia    She is here for an acute visit for cold symptoms.   Her symptoms started about 2 weeks ago - they were initially mild and waxed and waned and have gotten worse.    She is experiencing infrequent cough-it was worse when her symptoms first started but has become a little bit less, SOB, tired, fever - 100 today, chills, sweats, generalized weakness, mild congestion, mild runny nose, headaches  She has tried taking tylenol, excedrin, trelegy      Medications and allergies reviewed with patient and updated if appropriate.  Current Outpatient Medications on File Prior to Visit  Medication Sig Dispense Refill   amLODipine (NORVASC) 5 MG tablet TAKE 1 TABLET BY MOUTH EVERY DAY 90 tablet 0   aspirin-acetaminophen-caffeine (EXCEDRIN MIGRAINE) 250-250-65 MG tablet Take 2 tablets by mouth every 6 (six) hours as needed for headache.     atorvastatin (LIPITOR) 20 MG tablet Take 1 tablet (20 mg total) by mouth daily. 90 tablet 3   Cranberry 500 MG CAPS Take 500 mg by mouth daily.     cycloSPORINE (RESTASIS) 0.05 % ophthalmic emulsion 1 drop 2 (two) times daily.     denosumab (PROLIA) 60 MG/ML SOSY injection Inject 60 mg into the skin every 6 (six) months.     Erenumab-aooe (AIMOVIG) 70 MG/ML SOAJ INJECT '70MG'$  INTO THE SKIN EVERY 30 DAYS 3 mL 3   esomeprazole (NEXIUM) 40 MG capsule TAKE 1 CAPSULE (40 MG TOTAL) BY MOUTH DAILY AT 12 NOON. 90 capsule 1   famotidine (PEPCID) 40 MG tablet Take 1 tablet (40 mg total) by mouth daily. 90 tablet 2   Flaxseed, Linseed, (FLAXSEED OIL PO) Take 2 tablets by mouth daily.     fluticasone (FLONASE) 50 MCG/ACT nasal spray USE 1 SPRAY IN EACH NOSTRIL DAILY 48 mL 1   Fluticasone-Umeclidin-Vilant (TRELEGY ELLIPTA)  100-62.5-25 MCG/ACT AEPB Inhale 1 puff into the lungs daily. 2 each 5   nadolol (CORGARD) 40 MG tablet TAKE 1 TABLET BY MOUTH TWICE A DAY 180 tablet 1   valsartan (DIOVAN) 320 MG tablet Take 1 tablet (320 mg total) by mouth daily. 90 tablet 2   [DISCONTINUED] Calcium Carbonate (CALCIUM 500 PO) Take by mouth.       No current facility-administered medications on file prior to visit.    Review of Systems  Constitutional:  Positive for chills, diaphoresis, fatigue and fever.       Generalized weakness  HENT:  Positive for congestion (in am only) and rhinorrhea. Negative for ear pain, sinus pressure, sinus pain and sore throat.   Respiratory:  Positive for cough and shortness of breath. Negative for wheezing.   Gastrointestinal:  Negative for diarrhea and nausea.  Musculoskeletal:  Positive for myalgias (chronic and new).  Neurological:  Positive for headaches.       Objective:   Vitals:   08/10/22 1448 08/10/22 1513  BP: (!) 142/90   Pulse: 82   Temp: 98 F (36.7 C)   SpO2: (!) 88% 92%   BP Readings from Last 3 Encounters:  08/10/22 (!) 142/90  07/01/22 (!) 142/72  05/26/22 (!) 167/87   Wt Readings from Last 3 Encounters:  08/10/22 131  lb (59.4 kg)  07/01/22 132 lb (59.9 kg)  05/26/22 128 lb 8 oz (58.3 kg)   Body mass index is 24.75 kg/m.    Physical Exam Constitutional:      General: She is not in acute distress.    Appearance: Normal appearance. She is not ill-appearing.  HENT:     Head: Normocephalic and atraumatic.     Right Ear: Tympanic membrane, ear canal and external ear normal.     Left Ear: Tympanic membrane, ear canal and external ear normal.     Mouth/Throat:     Mouth: Mucous membranes are moist.     Pharynx: No oropharyngeal exudate or posterior oropharyngeal erythema.  Eyes:     Conjunctiva/sclera: Conjunctivae normal.  Cardiovascular:     Rate and Rhythm: Normal rate and regular rhythm.  Pulmonary:     Effort: Pulmonary effort is normal. No  respiratory distress.     Breath sounds: Normal breath sounds. No wheezing or rales.  Musculoskeletal:     Cervical back: Neck supple. No tenderness.  Lymphadenopathy:     Cervical: No cervical adenopathy.  Skin:    General: Skin is warm and dry.  Neurological:     Mental Status: She is alert.       DG Chest 2 View CLINICAL DATA:  Cough, shortness of breath.  EXAM: CHEST - 2 VIEW  COMPARISON:  December 03, 2021.  FINDINGS: Stable cardiomediastinal silhouette. Stable hiatal hernia. New left upper lobe opacity is noted which may represent inflammation or atelectasis, but neoplasm cannot be excluded. New mild nodular densities are noted in the left lower lobe is well. Bony thorax is unremarkable.  IMPRESSION: New left upper lobe opacity is noted which may represent pneumonia or atelectasis, but neoplasm cannot be excluded. Several nodular densities are also noted in the left lower lobe. CT scan of the chest is recommended for further evaluation. These results will be called to the ordering clinician or representative by the Radiologist Assistant, and communication documented in the PACS or zVision Dashboard.  Electronically Signed   By: Marijo Conception M.D.   On: 08/10/2022 15:45       Assessment & Plan:    See Problem List for Assessment and Plan of chronic medical problems.

## 2022-08-10 ENCOUNTER — Ambulatory Visit (INDEPENDENT_AMBULATORY_CARE_PROVIDER_SITE_OTHER): Payer: Medicare PPO

## 2022-08-10 ENCOUNTER — Ambulatory Visit: Payer: Medicare PPO | Admitting: Internal Medicine

## 2022-08-10 ENCOUNTER — Encounter: Payer: Self-pay | Admitting: Internal Medicine

## 2022-08-10 VITALS — BP 142/90 | HR 82 | Temp 98.0°F | Ht 61.0 in | Wt 131.0 lb

## 2022-08-10 DIAGNOSIS — R0609 Other forms of dyspnea: Secondary | ICD-10-CM

## 2022-08-10 DIAGNOSIS — R059 Cough, unspecified: Secondary | ICD-10-CM | POA: Diagnosis not present

## 2022-08-10 DIAGNOSIS — R051 Acute cough: Secondary | ICD-10-CM

## 2022-08-10 DIAGNOSIS — K449 Diaphragmatic hernia without obstruction or gangrene: Secondary | ICD-10-CM | POA: Diagnosis not present

## 2022-08-10 DIAGNOSIS — J189 Pneumonia, unspecified organism: Secondary | ICD-10-CM | POA: Diagnosis not present

## 2022-08-10 DIAGNOSIS — R0602 Shortness of breath: Secondary | ICD-10-CM | POA: Diagnosis not present

## 2022-08-10 DIAGNOSIS — I10 Essential (primary) hypertension: Secondary | ICD-10-CM | POA: Diagnosis not present

## 2022-08-10 DIAGNOSIS — R918 Other nonspecific abnormal finding of lung field: Secondary | ICD-10-CM | POA: Diagnosis not present

## 2022-08-10 LAB — POC COVID19 BINAXNOW: SARS Coronavirus 2 Ag: NEGATIVE

## 2022-08-10 MED ORDER — CEFPODOXIME PROXETIL 200 MG PO TABS: 200.0000 mg | ORAL_TABLET | Freq: Two times a day (BID) | ORAL | 0 refills | Status: AC

## 2022-08-10 MED ORDER — AZITHROMYCIN 250 MG PO TABS: ORAL_TABLET | ORAL | 0 refills | Status: DC

## 2022-08-10 NOTE — Patient Instructions (Addendum)
    You were tested for covid   Chest xray ordered - have this done downstairs.    Medications changes include :   cefpodoxime, zpak   Your prescription(s) have been sent to your pharmacy.     Return if symptoms worsen or fail to improve.

## 2022-08-10 NOTE — Assessment & Plan Note (Signed)
Chronic Blood pressure minimally elevated here today, but she is sick with pneumonia No change in medication Continue amlodipine 5 mg daily, nadolol 40 mg twice daily, valsartan 320 mg daily

## 2022-08-10 NOTE — Assessment & Plan Note (Signed)
Acute Symptoms started 2 weeks ago and were suggestive of pneumonia which she has had in the past Oxygen saturation when she first got in the room was slightly low 88, but improved at rest on room air to 92-93% Lungs sound relatively clear Chest x-ray shows new LUL pneumonia-neoplasm cannot be excluded Several nodular density areas noted in the left lower lung CT scan recommended Started on Z-Pak, Vantin 200 mg twice daily x10 days Has cough syrup at home We will obtain CT scan-would ideally like to wait 3-4 weeks to see if pneumonia clears so that we are able to visualize the lung slightly better Has follow-up with pulmonary in approximately 1 month so waiting to get the CT scan done prior to that Advised her to call if she is not feeling better or if she has any concerns

## 2022-08-25 ENCOUNTER — Other Ambulatory Visit: Payer: Self-pay | Admitting: Internal Medicine

## 2022-09-07 DIAGNOSIS — H04123 Dry eye syndrome of bilateral lacrimal glands: Secondary | ICD-10-CM | POA: Diagnosis not present

## 2022-09-07 DIAGNOSIS — H353131 Nonexudative age-related macular degeneration, bilateral, early dry stage: Secondary | ICD-10-CM | POA: Diagnosis not present

## 2022-09-07 DIAGNOSIS — H40023 Open angle with borderline findings, high risk, bilateral: Secondary | ICD-10-CM | POA: Diagnosis not present

## 2022-09-07 DIAGNOSIS — H35033 Hypertensive retinopathy, bilateral: Secondary | ICD-10-CM | POA: Diagnosis not present

## 2022-09-08 ENCOUNTER — Ambulatory Visit (HOSPITAL_BASED_OUTPATIENT_CLINIC_OR_DEPARTMENT_OTHER)
Admission: RE | Admit: 2022-09-08 | Discharge: 2022-09-08 | Disposition: A | Discharge status: Home / Self Care | Payer: Medicare PPO | Source: Ambulatory Visit | Attending: Internal Medicine | Admitting: Internal Medicine

## 2022-09-08 DIAGNOSIS — J189 Pneumonia, unspecified organism: Secondary | ICD-10-CM | POA: Insufficient documentation

## 2022-09-08 DIAGNOSIS — R918 Other nonspecific abnormal finding of lung field: Secondary | ICD-10-CM | POA: Diagnosis not present

## 2022-09-08 DIAGNOSIS — J479 Bronchiectasis, uncomplicated: Secondary | ICD-10-CM | POA: Diagnosis not present

## 2022-09-08 DIAGNOSIS — J9811 Atelectasis: Secondary | ICD-10-CM | POA: Diagnosis not present

## 2022-09-10 ENCOUNTER — Other Ambulatory Visit (HOSPITAL_COMMUNITY): Payer: Self-pay

## 2022-09-10 NOTE — Telephone Encounter (Signed)
Pharmacy Patient Advocate Encounter  Insurance verification completed.    The patient is insured through [employer]   Ran test claims for: Prolia 60mg.  Pharmacy benefit copay: $64.00 

## 2022-09-11 ENCOUNTER — Encounter: Payer: Self-pay | Admitting: Internal Medicine

## 2022-09-13 ENCOUNTER — Ambulatory Visit (INDEPENDENT_AMBULATORY_CARE_PROVIDER_SITE_OTHER): Payer: Medicare PPO | Admitting: Pulmonary Disease

## 2022-09-13 ENCOUNTER — Encounter: Payer: Self-pay | Admitting: Pulmonary Disease

## 2022-09-13 VITALS — BP 128/82 | HR 60 | Temp 98.0°F | Ht 61.0 in | Wt 135.2 lb

## 2022-09-13 DIAGNOSIS — J9811 Atelectasis: Secondary | ICD-10-CM

## 2022-09-13 DIAGNOSIS — R918 Other nonspecific abnormal finding of lung field: Secondary | ICD-10-CM | POA: Diagnosis not present

## 2022-09-13 DIAGNOSIS — J432 Centrilobular emphysema: Secondary | ICD-10-CM | POA: Diagnosis not present

## 2022-09-13 NOTE — Progress Notes (Unsigned)
Synopsis: Referred in April 2023 for COPD by Billey Gosling, MD  Subjective:   PATIENT ID: Joy Martin GENDER: female DOB: 1941-05-15, MRN: [id number]  HPI  Chief Complaint  Patient presents with   Follow-up    Follow-Up, CT scan last week   Joy Martin is an 81 year old woman, former smoker with CKD, GERD, and hypertension who returns to pulmonary clinic for COPD.   Trelegy?  No mucous production, no hemoptysis  She is gaining weight  Every once in a while night sweats.   Sleeps flat in bed   Initial OV 02/12/22 Last seen in 2014 by Dr. Melvyn Novas. PFTs in 2014 showed mild obstructive defect. 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   She is currently on wixela inhub 100-41mg. She experience mainly exertional shortness of breath. Denies cough, fevers or chills. She may have sweats from time to time. She denies weight loss. She has chronic sinus congestion and post-nasal drainage. She is using as needed flonase. Denies GERD.  She lives alone. She quit smoking in the 80s. 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   removed right upper lip/perinostril DMatilde Haymaker(WS)   Chronic sinusitis    follows with ENT for same   CKD (chronic kidney disease) 2018   COPD (chronic obstructive pulmonary disease) ([facility] [place] 2013   GOLD 1, follows with pulm for same   Episodic tension type headache    EXOGENOUS OBESITY    GERD    HYPERLIPIDEMIA    Hypertension    Migraines 07/10/2012   "over; last one was @ age 81   OSTEOPENIA    Pneumonia 2018-2019   x 2     Family History  Problem Relation Age of Onset   Prostate cancer Father    Cancer Father        prostate   Cancer Mother        waldenstruns microgobular anemia   Hyperparathyroidism Mother    Alcohol abuse Other    Heart disease  Other    Hypertension Other    Diabetes Other    Pancreatic cancer Other    Hyperparathyroidism Sister    Cancer Maternal Grandfather        pancreatic     Social History   Socioeconomic History   Marital status: Single    Spouse name: Not on file   Number of children: 0   Years of education: 16   Highest education level: Not on file  Occupational History    Comment: retired tPharmacist, 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  Tobacco Use   Smoking status: Former    Packs/day: 0.75    Years: 20.00    Total pack years: 15.00    Types: Cigarettes    Quit date: 07/25/1982    Years since quitting: 40.1   Smokeless tobacco: Never   Tobacco comments:    07/10/2012 "stopped smoking cigarettes 1980's"  Substance and Sexual Activity   Alcohol use: Yes    Comment: 07/10/2012 "glass of wine 1-2X/yr"   Drug use: No   Sexual activity: Never  Other Topics Concern   Not on file  Social History Narrative   Retired tPharmacist, hospital 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   Caffeine- coffee 1 cup, some Coke   Social Determinants of Health  Financial Resource Strain: Low Risk  (03/12/2022)   Overall Financial Resource Strain (CARDIA)    Difficulty of Paying Living Expenses: Not hard at all  Food Insecurity: No Food Insecurity (03/12/2022)   Hunger Vital Sign    Worried About Running Out of Food in the Last Year: Never true    Ran Out of Food in the Last Year: Never true  Transportation Needs: No Transportation Needs (03/12/2022)   PRAPARE - Hydrologist (Medical): No    Lack of Transportation (Non-Medical): No  Physical Activity: Inactive (03/12/2022)   Exercise Vital Sign    Days of Exercise per Week: 0 days    Minutes of Exercise per Session: 0 min  Stress: No Stress Concern Present (03/12/2022)   [place]    Feeling of Stress : Not at all  Social Connections: Moderately Integrated (03/12/2022)   Social Connection and Isolation Panel [NHANES]     Frequency of Communication with Friends and Family: Three times a week    Frequency of Social Gatherings with Friends and Family: Three times a week    Attends Religious Services: More than 4 times per year    Active Member of Clubs or Organizations: Yes    Attends Archivist Meetings: More than 4 times per year    Marital Status: Never married  Intimate Partner Violence: Not At Risk (03/12/2022)   Humiliation, Afraid, Rape, and Kick questionnaire    Fear of Current or Ex-Partner: No    Emotionally Abused: No    Physically Abused: No    Sexually Abused: No     Allergies  Allergen Reactions   Allegra [Fexofenadine Hcl] Other (See Comments)    Headache, "took the pill; I got flu symptoms"   Prilosec [Omeprazole]     Fecal incontince   Ranitidine     Fecal incontince     Outpatient Medications Prior to Visit  Medication Sig Dispense Refill   amLODipine (NORVASC) 5 MG tablet TAKE 1 TABLET BY MOUTH EVERY DAY 90 tablet 0   aspirin-acetaminophen-caffeine (EXCEDRIN MIGRAINE) 250-250-65 MG tablet Take 2 tablets by mouth every 6 (six) hours as needed for headache.     atorvastatin (LIPITOR) 20 MG tablet Take 1 tablet (20 mg total) by mouth daily. 90 tablet 3   Cranberry 500 MG CAPS Take 500 mg by mouth daily.     cycloSPORINE (RESTASIS) 0.05 % ophthalmic emulsion 1 drop 2 (two) times daily.     denosumab (PROLIA) 60 MG/ML SOSY injection Inject 60 mg into the skin every 6 (six) months.     Erenumab-aooe (AIMOVIG) 70 MG/ML SOAJ INJECT 70MG INTO THE SKIN EVERY 30 DAYS 3 mL 3   esomeprazole (NEXIUM) 40 MG capsule TAKE 1 CAPSULE (40 MG TOTAL) BY MOUTH DAILY AT 12 NOON. 90 capsule 1   famotidine (PEPCID) 40 MG tablet Take 1 tablet (40 mg total) by mouth daily. 90 tablet 2   Flaxseed, Linseed, (FLAXSEED OIL PO) Take 2 tablets by mouth daily.     Fluticasone-Umeclidin-Vilant (TRELEGY ELLIPTA) 100-62.5-25 MCG/ACT AEPB Inhale 1 puff into the lungs daily. 2 each 5   nadolol (CORGARD) 40 MG  tablet TAKE 1 TABLET BY MOUTH TWICE A DAY 180 tablet 1   valsartan (DIOVAN) 320 MG tablet Take 1 tablet (320 mg total) by mouth daily. 90 tablet 2   azithromycin (ZITHROMAX) 250 MG tablet Take two tabs the first day and then one tab daily for  four days 6 tablet 0   fluticasone (FLONASE) 50 MCG/ACT nasal spray USE 1 SPRAY IN EACH NOSTRIL DAILY 48 mL 1   No facility-administered medications prior to visit.    Review of Systems  Constitutional:  Negative for chills, fever, malaise/fatigue and weight loss.  HENT:  Negative for congestion, sinus pain and sore throat.   Eyes: Negative.   Respiratory:  Positive for shortness of breath. Negative for cough, hemoptysis, sputum production and wheezing.   Cardiovascular:  Negative for chest pain, palpitations, orthopnea, claudication and leg swelling.  Gastrointestinal:  Negative for abdominal pain, heartburn, nausea and vomiting.  Genitourinary: Negative.   Musculoskeletal:  Negative for joint pain and myalgias.  Skin:  Negative for rash.  Neurological:  Negative for weakness.  Endo/Heme/Allergies: Negative.   Psychiatric/Behavioral: Negative.        Objective:   Vitals:   09/13/22 1319  BP: 128/82  Pulse: 60  Temp: 98 F (36.7 C)  TempSrc: Oral  SpO2: 96%  Weight: 135 lb 3.2 oz (61.3 kg)  Height: _0  (1.549 m)     Physical Exam Constitutional:      General: She is not in acute distress.    Appearance: She is not ill-appearing.  HENT:     Head: Normocephalic and atraumatic.  Eyes:     General: No scleral icterus.    Conjunctiva/sclera: Conjunctivae normal.     Pupils: Pupils are equal, round, and reactive to light.  Cardiovascular:     Rate and Rhythm: Normal rate and regular rhythm.     Pulses: Normal pulses.     Heart sounds: Normal heart sounds. No murmur heard. Pulmonary:     Effort: Pulmonary effort is normal.     Breath sounds: Normal breath sounds. Decreased air movement present. No wheezing, rhonchi or rales.   Abdominal:     General: Bowel sounds are normal.     Palpations: Abdomen is soft.  Musculoskeletal:     Right lower leg: No edema.     Left lower leg: No edema.  Lymphadenopathy:     Cervical: No cervical adenopathy.  Skin:    General: Skin is warm and dry.  Neurological:     General: No focal deficit present.     Mental Status: She is alert.  Psychiatric:        Mood and Affect: Mood normal.        Behavior: Behavior normal.        Thought Content: Thought content normal.        Judgment: Judgment normal.   CBC    Component Value Date/Time   WBC 7.0 07/01/2022 1121   RBC 4.29 07/01/2022 1121   HGB 13.0 07/01/2022 1121   HCT 40.6 07/01/2022 1121   PLT 232.0 07/01/2022 1121   MCV 94.6 07/01/2022 1121   MCH 30.0 06/03/2020 1153   MCHC 32.0 07/01/2022 1121   RDW 14.3 07/01/2022 1121   LYMPHSABS 1.2 07/01/2022 1121   MONOABS 0.6 07/01/2022 1121   EOSABS 0.4 07/01/2022 1121   BASOSABS 0.1 07/01/2022 1121      Latest Ref Rng & Units 07/01/2022   11:21 AM 12/31/2021   10:52 AM 09/04/2021   10:43 AM  BMP  Glucose 70 - 99 mg/dL 101  90  106   BUN 6 - 23 mg/dL _1 Creatinine 0.40 - 1.20 mg/dL 1.15  1.21  1.31   Sodium 135 - 145 mEq/L 141  142  141  Potassium 3.5 - 5.1 mEq/L 5.0  5.1  4.0   Chloride 96 - 112 mEq/L 107  109  106   CO2 19 - 32 mEq/L _0 Calcium 8.4 - 10.5 mg/dL 9.9  11.0  12.0    11.8    Chest imaging: CXR 12/03/21 Similar appearance of the chest x-ray to the prior, with advanced emphysema and changes of lungs which most likely represent chronic atypical infection such as MAC based on the prior CT. No definite evidence of new superimposed airspace disease.   Hiatal hernia  PFT:     No data to display          Labs:  Path:  Echo 11/26/21: LV EF 60-65%. Grade I diastolic dysfunction. RV size and systolic function are normal. LA moderately dilated.   Heart Catheterization:  Assessment & Plan:   No diagnosis  found.  Discussion: Joy Martin is an 81 year old woman, former smoker with CKD, GERD, and hypertension who is referred to pulmonary clinic for COPD.   She has mild obstructive defect based on PFTs from 2014. We will trial her on trelegy ellipta 1 puff daily and she is to hold wixella. If she notices improvement we will send in prescription.   No concern for symptomatic MAC infection at this time. Will continue to monitor in the future.   She is to use flonase nightly to avoid morning time sinus congestion and pressure.  Follow up in 6 months.   Freda Jackson, MD [facility] Office: [phone]   Current Outpatient Medications:    amLODipine (NORVASC) 5 MG tablet, TAKE 1 TABLET BY MOUTH EVERY DAY, Disp: 90 tablet, Rfl: 0   aspirin-acetaminophen-caffeine (EXCEDRIN MIGRAINE) 250-250-65 MG tablet, Take 2 tablets by mouth every 6 (six) hours as needed for headache., Disp: , Rfl:    atorvastatin (LIPITOR) 20 MG tablet, Take 1 tablet (20 mg total) by mouth daily., Disp: 90 tablet, Rfl: 3   Cranberry 500 MG CAPS, Take 500 mg by mouth daily., Disp: , Rfl:    cycloSPORINE (RESTASIS) 0.05 % ophthalmic emulsion, 1 drop 2 (two) times daily., Disp: , Rfl:    denosumab (PROLIA) 60 MG/ML SOSY injection, Inject 60 mg into the skin every 6 (six) months., Disp: , Rfl:    Erenumab-aooe (AIMOVIG) 70 MG/ML SOAJ, INJECT 70MG INTO THE SKIN EVERY 30 DAYS, Disp: 3 mL, Rfl: 3   esomeprazole (NEXIUM) 40 MG capsule, TAKE 1 CAPSULE (40 MG TOTAL) BY MOUTH DAILY AT 12 NOON., Disp: 90 capsule, Rfl: 1   famotidine (PEPCID) 40 MG tablet, Take 1 tablet (40 mg total) by mouth daily., Disp: 90 tablet, Rfl: 2   Flaxseed, Linseed, (FLAXSEED OIL PO), Take 2 tablets by mouth daily., Disp: , Rfl:    Fluticasone-Umeclidin-Vilant (TRELEGY ELLIPTA) 100-62.5-25 MCG/ACT AEPB, Inhale 1 puff into the lungs daily., Disp: 2 each, Rfl: 5   nadolol (CORGARD) 40 MG tablet, TAKE 1 TABLET BY MOUTH TWICE A DAY,  Disp: 180 tablet, Rfl: 1   valsartan (DIOVAN) 320 MG tablet, Take 1 tablet (320 mg total) by mouth daily., Disp: 90 tablet, Rfl: 2

## 2022-09-13 NOTE — Patient Instructions (Signed)
Continue on trelegy ellipta 1 puff daily  We will repeat a CT Chest scan in 6 months   Try sleeping on a wedge pillow to reduce night time reflux due to your hiatal hernia  Consider a referral to pulmonary rehab to work on conditioning due to your exertional shortness of breath  Follow up in 6 months after CT Chest scan

## 2022-09-15 ENCOUNTER — Encounter: Payer: Self-pay | Admitting: Pulmonary Disease

## 2022-09-27 ENCOUNTER — Other Ambulatory Visit: Payer: Self-pay | Admitting: Internal Medicine

## 2022-10-26 DIAGNOSIS — I8311 Varicose veins of right lower extremity with inflammation: Secondary | ICD-10-CM | POA: Diagnosis not present

## 2022-10-26 DIAGNOSIS — I872 Venous insufficiency (chronic) (peripheral): Secondary | ICD-10-CM | POA: Diagnosis not present

## 2022-10-26 DIAGNOSIS — C44319 Basal cell carcinoma of skin of other parts of face: Secondary | ICD-10-CM | POA: Diagnosis not present

## 2022-10-26 DIAGNOSIS — Z85828 Personal history of other malignant neoplasm of skin: Secondary | ICD-10-CM | POA: Diagnosis not present

## 2022-10-26 DIAGNOSIS — L905 Scar conditions and fibrosis of skin: Secondary | ICD-10-CM | POA: Diagnosis not present

## 2022-10-26 DIAGNOSIS — L821 Other seborrheic keratosis: Secondary | ICD-10-CM | POA: Diagnosis not present

## 2022-10-26 DIAGNOSIS — I8312 Varicose veins of left lower extremity with inflammation: Secondary | ICD-10-CM | POA: Diagnosis not present

## 2022-10-26 DIAGNOSIS — L720 Epidermal cyst: Secondary | ICD-10-CM | POA: Diagnosis not present

## 2022-10-26 DIAGNOSIS — L570 Actinic keratosis: Secondary | ICD-10-CM | POA: Diagnosis not present

## 2022-10-28 ENCOUNTER — Telehealth: Payer: Self-pay | Admitting: *Deleted

## 2022-10-28 NOTE — Telephone Encounter (Signed)
"  PA Case: [id number], Status: Approved, Coverage Starts on: 11/08/2020 12:00:00 AM, Coverage Ends on: 11/08/2023 12:00:00 AM. Questions? Contact [phone]."

## 2022-10-28 NOTE — Telephone Encounter (Signed)
Submitted PA [employer] on covermymeds. Key: [id number]. Waiting on determination from [facility].

## 2022-11-21 ENCOUNTER — Other Ambulatory Visit: Payer: Self-pay | Admitting: Internal Medicine

## 2022-11-22 ENCOUNTER — Other Ambulatory Visit: Payer: Self-pay

## 2022-12-07 DIAGNOSIS — Z85828 Personal history of other malignant neoplasm of skin: Secondary | ICD-10-CM | POA: Diagnosis not present

## 2022-12-07 DIAGNOSIS — C44319 Basal cell carcinoma of skin of other parts of face: Secondary | ICD-10-CM | POA: Diagnosis not present

## 2022-12-25 ENCOUNTER — Other Ambulatory Visit: Payer: Self-pay | Admitting: Internal Medicine

## 2022-12-27 ENCOUNTER — Other Ambulatory Visit: Payer: Self-pay

## 2022-12-28 DIAGNOSIS — L821 Other seborrheic keratosis: Secondary | ICD-10-CM | POA: Diagnosis not present

## 2022-12-28 DIAGNOSIS — L720 Epidermal cyst: Secondary | ICD-10-CM | POA: Diagnosis not present

## 2022-12-28 DIAGNOSIS — Z85828 Personal history of other malignant neoplasm of skin: Secondary | ICD-10-CM | POA: Diagnosis not present

## 2022-12-28 DIAGNOSIS — L570 Actinic keratosis: Secondary | ICD-10-CM | POA: Diagnosis not present

## 2022-12-30 ENCOUNTER — Encounter: Payer: Self-pay | Admitting: Internal Medicine

## 2022-12-30 NOTE — Progress Notes (Signed)
Subjective:    Patient ID: Joy Martin, female    DOB: [birth year], 82 y.o.   MRN: [id number]     HPI Senait is here for follow up of her chronic medical problems, including htn, prediabetes, hld, CKD, gerd, OP, weight loss, migraines, posterior HA's from neck  Last Prolia 03/2022  Feet swelling for a couple of months.  They stopped doing that and she is not why that occurred.  She denies any swelling now.  She did not have any shortness of breath more than usual during this time.   Medications and allergies reviewed with patient and updated if appropriate.  Current Outpatient Medications on File Prior to Visit  Medication Sig Dispense Refill   amLODipine (NORVASC) 5 MG tablet TAKE 1 TABLET BY MOUTH EVERY DAY 90 tablet 0   aspirin-acetaminophen-caffeine (EXCEDRIN MIGRAINE) 250-250-65 MG tablet Take 2 tablets by mouth every 6 (six) hours as needed for headache.     atorvastatin (LIPITOR) 20 MG tablet Take 1 tablet (20 mg total) by mouth daily. 90 tablet 3   Cranberry 500 MG CAPS Take 500 mg by mouth daily.     cycloSPORINE (RESTASIS) 0.05 % ophthalmic emulsion 1 drop 2 (two) times daily.     denosumab (PROLIA) 60 MG/ML SOSY injection Inject 60 mg into the skin every 6 (six) months.     Erenumab-aooe (AIMOVIG) 70 MG/ML SOAJ INJECT '70MG'$  INTO THE SKIN EVERY 30 DAYS 3 mL 3   esomeprazole (NEXIUM) 40 MG capsule TAKE 1 CAPSULE (40 MG TOTAL) BY MOUTH DAILY AT 12 NOON. 90 capsule 1   famotidine (PEPCID) 40 MG tablet Take 1 tablet (40 mg total) by mouth daily. 90 tablet 2   Flaxseed, Linseed, (FLAXSEED OIL PO) Take 2 tablets by mouth daily.     Fluticasone-Umeclidin-Vilant (TRELEGY ELLIPTA) 100-62.5-25 MCG/ACT AEPB Inhale 1 puff into the lungs daily. 2 each 5   nadolol (CORGARD) 40 MG tablet TAKE 1 TABLET BY MOUTH TWICE A DAY 180 tablet 1   valsartan (DIOVAN) 320 MG tablet Take 1 tablet (320 mg total) by mouth daily. 90 tablet 2   [DISCONTINUED] Calcium Carbonate (CALCIUM 500 PO)  Take by mouth.       No current facility-administered medications on file prior to visit.     Review of Systems  Constitutional:  Negative for fever.  Respiratory:  Positive for shortness of breath (chronic - maybe a little better). Negative for cough and wheezing.   Cardiovascular:  Positive for leg swelling. Negative for chest pain and palpitations.  Neurological:  Positive for headaches. Negative for light-headedness.       Objective:   Vitals:   12/31/22 0913  BP: 126/74  Pulse: (!) 59  Temp: 97.9 F (36.6 C)  SpO2: 97%   BP Readings from Last 3 Encounters:  12/31/22 126/74  09/13/22 128/82  08/10/22 (!) 142/90   Wt Readings from Last 3 Encounters:  12/31/22 150 lb (68 kg)  09/13/22 135 lb 3.2 oz (61.3 kg)  08/10/22 131 lb (59.4 kg)   Body mass index is 28.34 kg/m.    Physical Exam Constitutional:      General: She is not in acute distress.    Appearance: Normal appearance.  HENT:     Head: Normocephalic and atraumatic.  Eyes:     Conjunctiva/sclera: Conjunctivae normal.  Cardiovascular:     Rate and Rhythm: Normal rate and regular rhythm.     Heart sounds: Normal heart sounds.  Pulmonary:  Effort: Pulmonary effort is normal. No respiratory distress.     Breath sounds: Normal breath sounds. No wheezing.  Musculoskeletal:     Cervical back: Neck supple.     Right lower leg: No edema.     Left lower leg: No edema.  Lymphadenopathy:     Cervical: No cervical adenopathy.  Skin:    General: Skin is warm and dry.     Findings: No rash.  Neurological:     Mental Status: She is alert. Mental status is at baseline.  Psychiatric:        Mood and Affect: Mood normal.        Behavior: Behavior normal.        Lab Results  Component Value Date   WBC 7.0 07/01/2022   HGB 13.0 07/01/2022   HCT 40.6 07/01/2022   PLT 232.0 07/01/2022   GLUCOSE 101 (H) 07/01/2022   CHOL 160 07/01/2022   TRIG 100.0 07/01/2022   HDL 68.90 07/01/2022   LDLDIRECT 143.2  12/19/2007   LDLCALC 71 07/01/2022   ALT 12 07/01/2022   AST 19 07/01/2022   NA 141 07/01/2022   K 5.0 07/01/2022   CL 107 07/01/2022   CREATININE 1.15 07/01/2022   BUN 26 (H) 07/01/2022   CO2 29 07/01/2022   TSH 2.26 12/05/2020   HGBA1C 6.3 07/01/2022     Assessment & Plan:    See Problem List for Assessment and Plan of chronic medical problems.

## 2022-12-30 NOTE — Patient Instructions (Addendum)
      Blood work was ordered.   The lab is on the first floor.    Medications changes include :   none     Return in about 6 months (around 07/01/2023) for Physical Exam.

## 2022-12-31 ENCOUNTER — Encounter: Payer: Self-pay | Admitting: Internal Medicine

## 2022-12-31 ENCOUNTER — Ambulatory Visit: Payer: Medicare PPO | Admitting: Internal Medicine

## 2022-12-31 VITALS — BP 126/74 | HR 59 | Temp 97.9°F | Ht 61.0 in | Wt 150.0 lb

## 2022-12-31 DIAGNOSIS — G43809 Other migraine, not intractable, without status migrainosus: Secondary | ICD-10-CM

## 2022-12-31 DIAGNOSIS — N1831 Chronic kidney disease, stage 3a: Secondary | ICD-10-CM

## 2022-12-31 DIAGNOSIS — G4486 Cervicogenic headache: Secondary | ICD-10-CM

## 2022-12-31 DIAGNOSIS — J449 Chronic obstructive pulmonary disease, unspecified: Secondary | ICD-10-CM

## 2022-12-31 DIAGNOSIS — I10 Essential (primary) hypertension: Secondary | ICD-10-CM | POA: Diagnosis not present

## 2022-12-31 DIAGNOSIS — K219 Gastro-esophageal reflux disease without esophagitis: Secondary | ICD-10-CM

## 2022-12-31 DIAGNOSIS — M810 Age-related osteoporosis without current pathological fracture: Secondary | ICD-10-CM

## 2022-12-31 DIAGNOSIS — R7303 Prediabetes: Secondary | ICD-10-CM | POA: Diagnosis not present

## 2022-12-31 DIAGNOSIS — E785 Hyperlipidemia, unspecified: Secondary | ICD-10-CM

## 2022-12-31 LAB — CBC WITH DIFFERENTIAL/PLATELET
Basophils Absolute: 0.1 10*3/uL (ref 0.0–0.1)
Basophils Relative: 0.7 % (ref 0.0–3.0)
Eosinophils Absolute: 0.3 10*3/uL (ref 0.0–0.7)
Eosinophils Relative: 4 % (ref 0.0–5.0)
HCT: 38.1 % (ref 36.0–46.0)
Hemoglobin: 12.4 g/dL (ref 12.0–15.0)
Lymphocytes Relative: 12.2 % (ref 12.0–46.0)
Lymphs Abs: 1 10*3/uL (ref 0.7–4.0)
MCHC: 32.6 g/dL (ref 30.0–36.0)
MCV: 93.5 fl (ref 78.0–100.0)
Monocytes Absolute: 0.5 10*3/uL (ref 0.1–1.0)
Monocytes Relative: 6.4 % (ref 3.0–12.0)
Neutro Abs: 6.4 10*3/uL (ref 1.4–7.7)
Neutrophils Relative %: 76.7 % (ref 43.0–77.0)
Platelets: 271 10*3/uL (ref 150.0–400.0)
RBC: 4.07 Mil/uL (ref 3.87–5.11)
RDW: 15.6 % — ABNORMAL HIGH (ref 11.5–15.5)
WBC: 8.3 10*3/uL (ref 4.0–10.5)

## 2022-12-31 LAB — LIPID PANEL
Cholesterol: 158 mg/dL (ref 0–200)
HDL: 71 mg/dL (ref >39.00)
LDL Cholesterol: 66 mg/dL (ref 0–99)
NonHDL: 87.34
Total CHOL/HDL Ratio: 2
Triglycerides: 105 mg/dL (ref 0.0–149.0)
VLDL: 21 mg/dL (ref 0.0–40.0)

## 2022-12-31 LAB — VITAMIN D 25 HYDROXY (VIT D DEFICIENCY, FRACTURES): VITD: 23.74 ng/mL — ABNORMAL LOW (ref 30.00–100.00)

## 2022-12-31 LAB — HEMOGLOBIN A1C: Hgb A1c MFr Bld: 6.1 % (ref 4.6–6.5)

## 2022-12-31 LAB — COMPREHENSIVE METABOLIC PANEL
ALT: 21 U/L (ref 0–35)
AST: 27 U/L (ref 0–37)
Albumin: 3.9 g/dL (ref 3.5–5.2)
Alkaline Phosphatase: 121 U/L — ABNORMAL HIGH (ref 39–117)
BUN: 26 mg/dL — ABNORMAL HIGH (ref 6–23)
CO2: 28 mEq/L (ref 19–32)
Calcium: 11.4 mg/dL — ABNORMAL HIGH (ref 8.4–10.5)
Chloride: 107 mEq/L (ref 96–112)
Creatinine, Ser: 1.22 mg/dL — ABNORMAL HIGH (ref 0.40–1.20)
GFR: 40.94 mL/min — ABNORMAL LOW (ref >60.00)
Glucose, Bld: 90 mg/dL (ref 70–99)
Potassium: 5 mEq/L (ref 3.5–5.1)
Sodium: 143 mEq/L (ref 135–145)
Total Bilirubin: 0.3 mg/dL (ref 0.2–1.2)
Total Protein: 7.2 g/dL (ref 6.0–8.3)

## 2022-12-31 NOTE — Assessment & Plan Note (Signed)
Chronic Shortness of breath stable-possibly slightly improved Continue Trelegy inhaler daily

## 2022-12-31 NOTE — Assessment & Plan Note (Signed)
Chronic Following with nephrology CMP Blood pressure well-controlled

## 2022-12-31 NOTE — Assessment & Plan Note (Signed)
Chronic Blood pressure well controlled CMP Continue amlodipine 5 mg daily, nadolol 40 mg twice daily, valsartan 320 mg daily

## 2022-12-31 NOTE — Assessment & Plan Note (Signed)
Chronic Controlled Continue Aimovig 70 mg every 30 days, Excedrin Migraine as needed No longer following with neurology

## 2022-12-31 NOTE — Assessment & Plan Note (Signed)
Chronic Check lipid panel  Continue atorvastatin 20 mg daily Regular exercise and healthy diet encouraged  

## 2022-12-31 NOTE — Assessment & Plan Note (Addendum)
Chronic Has seen neurology Can refer to PT and ortho if she wishes

## 2022-12-31 NOTE — Assessment & Plan Note (Signed)
Chronic On Prolia every 6 months Continue vitamin D daily Check vitamin D level Overdue for Prolia-will check into coverage

## 2022-12-31 NOTE — Assessment & Plan Note (Signed)
Chronic GERD controlled Continue Nexium 40 mg daily, famotidine 40 mg daily

## 2022-12-31 NOTE — Assessment & Plan Note (Signed)
Chronic Check a1c Low sugar / carb diet Stressed regular exercise  

## 2023-01-04 DIAGNOSIS — Z1231 Encounter for screening mammogram for malignant neoplasm of breast: Secondary | ICD-10-CM | POA: Diagnosis not present

## 2023-01-04 LAB — HM MAMMOGRAPHY

## 2023-01-07 ENCOUNTER — Encounter: Payer: Self-pay | Admitting: Internal Medicine

## 2023-01-07 NOTE — Progress Notes (Signed)
Outside notes received. Information abstracted. Notes sent to scan.  

## 2023-02-24 ENCOUNTER — Other Ambulatory Visit: Payer: Self-pay | Admitting: Internal Medicine

## 2023-02-24 ENCOUNTER — Other Ambulatory Visit: Payer: Self-pay

## 2023-03-02 DIAGNOSIS — Z85828 Personal history of other malignant neoplasm of skin: Secondary | ICD-10-CM | POA: Diagnosis not present

## 2023-03-02 DIAGNOSIS — L821 Other seborrheic keratosis: Secondary | ICD-10-CM | POA: Diagnosis not present

## 2023-03-02 DIAGNOSIS — L570 Actinic keratosis: Secondary | ICD-10-CM | POA: Diagnosis not present

## 2023-03-02 DIAGNOSIS — L720 Epidermal cyst: Secondary | ICD-10-CM | POA: Diagnosis not present

## 2023-03-10 ENCOUNTER — Telehealth: Payer: Self-pay

## 2023-03-10 NOTE — Telephone Encounter (Signed)
Contacted Joy Martin to schedule their annual wellness visit. Appointment made for 03/15/23.  Joy Martin, CMA (AAMA)  [facility] [phone]

## 2023-03-14 ENCOUNTER — Ambulatory Visit (HOSPITAL_COMMUNITY)
Admission: RE | Admit: 2023-03-14 | Discharge: 2023-03-14 | Disposition: A | Discharge status: Home / Self Care | Payer: Medicare PPO | Source: Ambulatory Visit | Attending: Pulmonary Disease | Admitting: Pulmonary Disease

## 2023-03-14 DIAGNOSIS — R918 Other nonspecific abnormal finding of lung field: Secondary | ICD-10-CM | POA: Diagnosis not present

## 2023-03-14 DIAGNOSIS — J9811 Atelectasis: Secondary | ICD-10-CM | POA: Insufficient documentation

## 2023-03-15 ENCOUNTER — Ambulatory Visit (INDEPENDENT_AMBULATORY_CARE_PROVIDER_SITE_OTHER): Payer: Medicare PPO

## 2023-03-15 ENCOUNTER — Telehealth: Payer: Self-pay

## 2023-03-15 VITALS — Ht 61.0 in | Wt 150.0 lb

## 2023-03-15 DIAGNOSIS — N183 Chronic kidney disease, stage 3 unspecified: Secondary | ICD-10-CM | POA: Diagnosis not present

## 2023-03-15 DIAGNOSIS — Z0000 Encounter for general adult medical examination without abnormal findings: Secondary | ICD-10-CM | POA: Diagnosis not present

## 2023-03-15 DIAGNOSIS — Z13820 Encounter for screening for osteoporosis: Secondary | ICD-10-CM

## 2023-03-15 DIAGNOSIS — N189 Chronic kidney disease, unspecified: Secondary | ICD-10-CM | POA: Diagnosis not present

## 2023-03-15 NOTE — Patient Instructions (Addendum)
Joy Martin , Thank you for taking time to come for your Medicare Wellness Visit. I appreciate your ongoing commitment to your health goals. Please review the following plan we discussed and let me know if I can assist you in the future.   These are the goals we discussed:  Goals      Weight (lb) < 150 lb (68 kg)     Continue to lose weight.        This is a list of the screening recommended for you and due dates:  Health Maintenance  Topic Date Due   DTaP/Tdap/Td vaccine (3 - Tdap) 12/18/2017   DEXA scan (bone density measurement)  06/12/2022   COVID-19 Vaccine (7 - 2023-24 season) 10/12/2022   Flu Shot  06/09/2023   Medicare Annual Wellness Visit  03/14/2024   Pneumonia Vaccine  Completed   Zoster (Shingles) Vaccine  Completed   HPV Vaccine  Aged Out    Advanced directives: YES  Conditions/risks identified: YES  Next appointment: Follow up in one year for your annual wellness visit.   Preventive Care 84 Years and Older, Female Preventive care refers to lifestyle choices and visits with your health care provider that can promote health and wellness. What does preventive care include? A yearly physical exam. This is also called an annual well check. Dental exams once or twice a year. Routine eye exams. Ask your health care provider how often you should have your eyes checked. Personal lifestyle choices, including: Daily care of your teeth and gums. Regular physical activity. Eating a healthy diet. Avoiding tobacco and drug use. Limiting alcohol use. Practicing safe sex. Taking low-dose aspirin every day. Taking vitamin and mineral supplements as recommended by your health care provider. What happens during an annual well check? The services and screenings done by your health care provider during your annual well check will depend on your age, overall health, lifestyle risk factors, and family history of disease. Counseling  Your health care provider may ask you  questions about your: Alcohol use. Tobacco use. Drug use. Emotional well-being. Home and relationship well-being. Sexual activity. Eating habits. History of falls. Memory and ability to understand (cognition). Work and work Astronomer. Reproductive health. Screening  You may have the following tests or measurements: Height, weight, and BMI. Blood pressure. Lipid and cholesterol levels. These may be checked every 5 years, or more frequently if you are over 60 years old. Skin check. Lung cancer screening. You may have this screening every year starting at age 84 if you have a 30-pack-year history of smoking and currently smoke or have quit within the past 15 years. Fecal occult blood test (FOBT) of the stool. You may have this test every year starting at age 84. Flexible sigmoidoscopy or colonoscopy. You may have a sigmoidoscopy every 5 years or a colonoscopy every 10 years starting at age 84. Hepatitis C blood test. Hepatitis B blood test. Sexually transmitted disease (STD) testing. Diabetes screening. This is done by checking your blood sugar (glucose) after you have not eaten for a while (fasting). You may have this done every 1-3 years. Bone density scan. This is done to screen for osteoporosis. You may have this done starting at age 84. Mammogram. This may be done every 1-2 years. Talk to your health care provider about how often you should have regular mammograms. Talk with your health care provider about your test results, treatment options, and if necessary, the need for more tests. Vaccines  Your health care provider may  recommend certain vaccines, such as: Influenza vaccine. This is recommended every year. Tetanus, diphtheria, and acellular pertussis (Tdap, Td) vaccine. You may need a Td booster every 10 years. Zoster vaccine. You may need this after age 84. Pneumococcal 13-valent conjugate (PCV13) vaccine. One dose is recommended after age 84. Pneumococcal polysaccharide  (PPSV23) vaccine. One dose is recommended after age 84. Talk to your health care provider about which screenings and vaccines you need and how often you need them. This information is not intended to replace advice given to you by your health care provider. Make sure you discuss any questions you have with your health care provider. Document Released: 11/21/2015 Document Revised: 07/14/2016 Document Reviewed: 08/26/2015 Elsevier Interactive Patient Education  2017 [place] Prevention in the Home Falls can cause injuries. They can happen to people of all ages. There are many things you can do to make your home safe and to help prevent falls. What can I do on the outside of my home? Regularly fix the edges of walkways and driveways and fix any cracks. Remove anything that might make you trip as you walk through a door, such as a raised step or threshold. Trim any bushes or trees on the path to your home. Use bright outdoor lighting. Clear any walking paths of anything that might make someone trip, such as rocks or tools. Regularly check to see if handrails are loose or broken. Make sure that both sides of any steps have handrails. Any raised decks and porches should have guardrails on the edges. Have any leaves, snow, or ice cleared regularly. Use sand or salt on walking paths during winter. Clean up any spills in your garage right away. This includes oil or grease spills. What can I do in the bathroom? Use night lights. Install grab bars by the toilet and in the tub and shower. Do not use towel bars as grab bars. Use non-skid mats or decals in the tub or shower. If you need to sit down in the shower, use a plastic, non-slip stool. Keep the floor dry. Clean up any water that spills on the floor as soon as it happens. Remove soap buildup in the tub or shower regularly. Attach bath mats securely with double-sided non-slip rug tape. Do not have throw rugs and other things on the  floor that can make you trip. What can I do in the bedroom? Use night lights. Make sure that you have a light by your bed that is easy to reach. Do not use any sheets or blankets that are too big for your bed. They should not hang down onto the floor. Have a firm chair that has side arms. You can use this for support while you get dressed. Do not have throw rugs and other things on the floor that can make you trip. What can I do in the kitchen? Clean up any spills right away. Avoid walking on wet floors. Keep items that you use a lot in easy-to-reach places. If you need to reach something above you, use a strong step stool that has a grab bar. Keep electrical cords out of the way. Do not use floor polish or wax that makes floors slippery. If you must use wax, use non-skid floor wax. Do not have throw rugs and other things on the floor that can make you trip. What can I do with my stairs? Do not leave any items on the stairs. Make sure that there are handrails on both sides of  the stairs and use them. Fix handrails that are broken or loose. Make sure that handrails are as long as the stairways. Check any carpeting to make sure that it is firmly attached to the stairs. Fix any carpet that is loose or worn. Avoid having throw rugs at the top or bottom of the stairs. If you do have throw rugs, attach them to the floor with carpet tape. Make sure that you have a light switch at the top of the stairs and the bottom of the stairs. If you do not have them, ask someone to add them for you. What else can I do to help prevent falls? Wear shoes that: Do not have high heels. Have rubber bottoms. Are comfortable and fit you well. Are closed at the toe. Do not wear sandals. If you use a stepladder: Make sure that it is fully opened. Do not climb a closed stepladder. Make sure that both sides of the stepladder are locked into place. Ask someone to hold it for you, if possible. Clearly mark and make  sure that you can see: Any grab bars or handrails. First and last steps. Where the edge of each step is. Use tools that help you move around (mobility aids) if they are needed. These include: Canes. Walkers. Scooters. Crutches. Turn on the lights when you go into a dark area. Replace any light bulbs as soon as they burn out. Set up your furniture so you have a clear path. Avoid moving your furniture around. If any of your floors are uneven, fix them. If there are any pets around you, be aware of where they are. Review your medicines with your doctor. Some medicines can make you feel dizzy. This can increase your chance of falling. Ask your doctor what other things that you can do to help prevent falls. This information is not intended to replace advice given to you by your health care provider. Make sure you discuss any questions you have with your health care provider. Document Released: 08/21/2009 Document Revised: 04/01/2016 Document Reviewed: 11/29/2014 Elsevier Interactive Patient Education  2017 [employer].

## 2023-03-15 NOTE — Progress Notes (Signed)
I connected with  Joy Martin on 03/15/23 by a audio enabled telemedicine application and verified that I am speaking with the correct person using two identifiers.  Patient Location: Home  Provider Location: Office/Clinic  I discussed the limitations of evaluation and management by telemedicine. The patient expressed understanding and agreed to proceed.  Subjective:   Joy Martin is a 82 y.o. female who presents for Medicare Annual (Subsequent) preventive examination.  Review of Systems     Cardiac Risk Factors include: advanced age (>57men, >87 women)     Objective:    Today's Vitals   03/15/23 1433  Weight: 150 lb (68 kg)  Height: 5\' 1"  (1.549 m)  PainSc: 0-No pain   Body mass index is 28.34 kg/m.     03/15/2023    2:35 PM 03/12/2022   11:34 AM 03/05/2021   10:23 AM 04/06/2017   11:11 AM 07/28/2016    1:54 PM 07/10/2012    7:45 PM  Advanced Directives  Does Patient Have a Medical Advance Directive? Yes Yes Yes No Yes Patient has advance directive, copy not in chart  Type of Advance Directive Healthcare Power of [place];Living will Healthcare Power of [place];Living will Living will;Healthcare Power of [employer] Power of Attorney  Does patient want to make changes to medical advance directive?   No - Patient declined     Copy of Healthcare Power of Attorney in Chart? No - copy requested No - copy requested No - copy requested   Copy requested from other (Comment)  Would patient like information on creating a medical advance directive?    No - Patient declined      Current Medications (verified) Outpatient Encounter Medications as of 03/15/2023  Medication Sig   amLODipine (NORVASC) 5 MG tablet TAKE 1 TABLET BY MOUTH EVERY DAY   aspirin-acetaminophen-caffeine (EXCEDRIN MIGRAINE) 250-250-65 MG tablet Take 2 tablets by mouth every 6 (six) hours as needed for headache.   atorvastatin (LIPITOR) 20 MG tablet Take 1 tablet (20 mg total) by mouth daily.    Cranberry 500 MG CAPS Take 500 mg by mouth daily.   cycloSPORINE (RESTASIS) 0.05 % ophthalmic emulsion 1 drop 2 (two) times daily.   denosumab (PROLIA) 60 MG/ML SOSY injection Inject 60 mg into the skin every 6 (six) months.   Erenumab-aooe (AIMOVIG) 70 MG/ML SOAJ INJECT 70MG  INTO THE SKIN EVERY 30 DAYS   esomeprazole (NEXIUM) 40 MG capsule TAKE 1 CAPSULE BY MOUTH DAILY AT 12 NOON.   famotidine (PEPCID) 40 MG tablet Take 1 tablet (40 mg total) by mouth daily.   Flaxseed, Linseed, (FLAXSEED OIL PO) Take 2 tablets by mouth daily.   Fluticasone-Umeclidin-Vilant (TRELEGY ELLIPTA) 100-62.5-25 MCG/ACT AEPB Inhale 1 puff into the lungs daily.   nadolol (CORGARD) 40 MG tablet TAKE 1 TABLET BY MOUTH TWICE A DAY   valsartan (DIOVAN) 320 MG tablet Take 1 tablet (320 mg total) by mouth daily.   [DISCONTINUED] Calcium Carbonate (CALCIUM 500 PO) Take by mouth.     No facility-administered encounter medications on file as of 03/15/2023.    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   removed right upper lip/perinostril [place] ([facility])   Chronic sinusitis    follows with ENT for same   CKD (chronic kidney  disease) 2018   COPD (chronic obstructive pulmonary disease) (HCC) dx 2013   GOLD 1, follows with pulm for same   Episodic tension type headache    EXOGENOUS OBESITY    GERD    HYPERLIPIDEMIA    Hypertension    Migraines 07/10/2012   "over; last one was @ age 1"   OSTEOPENIA    Pneumonia 2018-2019   x 2   Past Surgical History:  Procedure Laterality Date   APPENDECTOMY  07/10/2012   BREAST CYST ASPIRATION  ?1980's   right   EYE SURGERY     both eyes   KNEE ARTHROSCOPY  1970's or 1980's   right; torn cartilage   LAPAROSCOPIC APPENDECTOMY  07/10/2012   Procedure: APPENDECTOMY  LAPAROSCOPIC;  Surgeon: Shelly Rubenstein, MD;  Location: [facility] OR;  Service: General;  Laterality: N/A;   MOHS SURGERY  01/2016   nose   MOLE REMOVAL  1957   "my back"   SKIN CANCER EXCISION     "2 on my back; 2 on my face"   TONSILLECTOMY AND ADENOIDECTOMY  1946   WISDOM TOOTH EXTRACTION  1959   Family History  Problem Relation Age of Onset   Prostate cancer Father    Cancer Father        prostate   Cancer Mother        waldenstruns microgobular anemia   Hyperparathyroidism Mother    Alcohol abuse Other    Heart disease Other    Hypertension Other    Diabetes Other    Pancreatic cancer Other    Hyperparathyroidism Sister    Cancer Maternal Grandfather        pancreatic   Social History   Socioeconomic History   Marital status: Single    Spouse name: Not on file   Number of children: 0   Years of education: 16   Highest education level: Not on file  Occupational History    Comment: retired Runner, broadcasting/film/video, 5th grade  Tobacco Use   Smoking status: Former    Packs/day: 0.75    Years: 20.00    Additional pack years: 0.00    Total pack years: 15.00    Types: Cigarettes    Quit date: 07/25/1982    Years since quitting: 40.6   Smokeless tobacco: Never   Tobacco comments:    07/10/2012 "stopped smoking cigarettes 1980's"  Substance and Sexual Activity   Alcohol use: Yes    Comment: 07/10/2012 "glass of wine 1-2X/yr"   Drug use: No   Sexual activity: Never  Other Topics Concern   Not on file  Social History Narrative   Retired Runner, broadcasting/film/video, 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   Caffeine- coffee 1 cup, some Coke   Social Determinants of Health   Financial Resource Strain: Low Risk  (03/15/2023)   Overall Financial Resource Strain (CARDIA)    Difficulty of Paying Living Expenses: Not hard at all  Food Insecurity: No Food Insecurity (03/15/2023)   Hunger Vital Sign    Worried About Running Out of Food in the Last Year: Never true    Ran Out of Food in the Last Year: Never true  Transportation  Needs: No Transportation Needs (03/15/2023)   PRAPARE - Administrator, Civil Service (Medical): No    Lack of Transportation (Non-Medical): No  Physical Activity: Inactive (03/15/2023)   Exercise Vital Sign    Days of Exercise per Week: 0 days    Minutes of Exercise per Session: 0 min  Stress:  No Stress Concern Present (03/15/2023)   [employer] of Occupational Health - Occupational Stress Questionnaire    Feeling of Stress : Not at all  Social Connections: Moderately Integrated (03/15/2023)   Social Connection and Isolation Panel [NHANES]    Frequency of Communication with Friends and Family: Three times a week    Frequency of Social Gatherings with Friends and Family: Three times a week    Attends Religious Services: More than 4 times per year    Active Member of Clubs or Organizations: Yes    Attends Banker Meetings: More than 4 times per year    Marital Status: Never married    Tobacco Counseling Counseling given: Not Answered Tobacco comments: 07/10/2012 "stopped smoking cigarettes 1980's"   Clinical Intake:  Pre-visit preparation completed: Yes  Pain : No/denies pain Pain Score: 0-No pain     BMI - recorded: 28.34 Nutritional Status: BMI 25 -29 Overweight Nutritional Risks: None Diabetes: No  How often do you need to have someone help you when you read instructions, pamphlets, or other written materials from your doctor or pharmacy?: 1 - Never What is the last grade level you completed in school?: HSG  Diabetic? No  Interpreter Needed?: No  Information entered by :: Susie Cassette, LPN.   Activities of Daily Living    03/15/2023    2:38 PM  In your present state of health, do you have any difficulty performing the following activities:  Hearing? 0  Vision? 0  Difficulty concentrating or making decisions? 0  Walking or climbing stairs? 0  Dressing or bathing? 0  Doing errands, shopping? 0  Preparing Food and eating ? N  Using  the Toilet? N  In the past six months, have you accidently leaked urine? N  Do you have problems with loss of bowel control? Y  Managing your Medications? N  Managing your Finances? N  Housekeeping or managing your Housekeeping? N    Patient Care Team: Pincus Sanes, MD as PCP - General (Internal Medicine) Jodelle Red, MD as PCP - Cardiology (Cardiology) Nyoka Cowden, MD (Pulmonary Disease) Suzanna Obey, MD (Otolaryngology) Mat Carne, DO as Consulting Physician (Ophthalmology)  Indicate any recent Medical Services you may have received from other than [facility] providers in the past year (date may be approximate).     Assessment:   This is a routine wellness examination for Joy Martin.  Hearing/Vision screen Hearing Screening - Comments:: Denies hearing difficulties   Vision Screening - Comments:: Wears rx glasses - up to date with routine eye exams with Dr. Blair Hailey    Dietary issues and exercise activities discussed: Current Exercise Habits: The patient does not participate in regular exercise at present, Exercise limited by: respiratory conditions(s);orthopedic condition(s) (COPD)   Goals Addressed   None   Depression Screen    03/15/2023    2:36 PM 12/31/2022    9:13 AM 07/01/2022   10:29 AM 03/12/2022   11:34 AM 03/12/2022   11:33 AM 12/31/2021   10:16 AM 03/05/2021   10:22 AM  PHQ 2/9 Scores  PHQ - 2 Score 0 0 3 0 0 1 0  PHQ- 9 Score 3  4        Fall Risk    03/15/2023    2:36 PM 12/31/2022    9:13 AM 03/12/2022   11:34 AM 12/31/2021   10:15 AM 03/05/2021   10:23 AM  Fall Risk   Falls in the past year? 0 0 0 0 1  Number falls in past yr: 0 0 0 0 0  Injury with Fall? 0 0 0 0 1  Risk for fall due to : No Fall Risks No Fall Risks  No Fall Risks Orthopedic patient  Follow up Falls prevention discussed Falls evaluation completed Falls evaluation completed Falls evaluation completed Falls evaluation completed    FALL RISK PREVENTION PERTAINING TO THE  HOME:  Any stairs in or around the home? No  If so, are there any without handrails? No  Home free of loose throw rugs in walkways, pet beds, electrical cords, etc? Yes  Adequate lighting in your home to reduce risk of falls? Yes   ASSISTIVE DEVICES UTILIZED TO PREVENT FALLS:  Life alert? No  Use of a cane, walker or w/c? No  Grab bars in the bathroom? Yes  Shower chair or bench in shower? Yes  Elevated toilet seat or a handicapped toilet? Yes   TIMED UP AND GO:  Was the test performed? No . Telephonic Visit   Cognitive Function:        03/15/2023    2:39 PM  6CIT Screen  What Year? 0 points  What month? 0 points  What time? 0 points  Count back from 20 0 points  Months in reverse 0 points  Repeat phrase 0 points  Total Score 0 points    Immunizations Immunization History  Administered Date(s) Administered   Fluad Quad(high Dose 65+) 07/11/2020, 08/28/2021   Influenza Split 08/13/2011, 07/18/2012, 07/22/2014   Influenza Whole 08/23/2007, 08/21/2008, 08/05/2009, 07/29/2010   Influenza, High Dose Seasonal PF 07/26/2013, 07/22/2014, 08/16/2018, 08/08/2019, 08/03/2022   Influenza-Unspecified 07/19/2016, 07/19/2017, 08/16/2018   PFIZER Comirnaty(Gray Top)Covid-19 Tri-Sucrose Vaccine 02/13/2021   PFIZER(Purple Top)SARS-COV-2 Vaccination 11/28/2019, 12/19/2019, 08/21/2020   Pfizer Covid-19 Vaccine Bivalent Booster 31yrs & up 08/21/2021   Pneumococcal Conjugate-13 11/06/2013   Pneumococcal Polysaccharide-23 12/19/2007   Respiratory Syncytial Virus Vaccine,Recomb Aduvanted(Arexvy) 09/03/2022   Td 11/08/1996, 12/19/2007   Unspecified SARS-COV-2 Vaccination 08/17/2022   Zoster Recombinat (Shingrix) 03/03/2017, 08/29/2017   Zoster, Live 03/24/2006    TDAP status: Due, Education has been provided regarding the importance of this vaccine. Advised may receive this vaccine at local pharmacy or Health Dept. Aware to provide a copy of the vaccination record if obtained from local  pharmacy or Health Dept. Verbalized acceptance and understanding.  Flu Vaccine status: Up to date  Pneumococcal vaccine status: Up to date  Covid-19 vaccine status: Completed vaccines  Qualifies for Shingles Vaccine? Yes   Zostavax completed Yes   Shingrix Completed?: Yes  Screening Tests Health Maintenance  Topic Date Due   DTaP/Tdap/Td (3 - Tdap) 12/18/2017   DEXA SCAN  06/12/2022   COVID-19 Vaccine (7 - 2023-24 season) 10/12/2022   INFLUENZA VACCINE  06/09/2023   Medicare Annual Wellness (AWV)  03/14/2024   Pneumonia Vaccine 65+ Years old  Completed   Zoster Vaccines- Shingrix  Completed   HPV VACCINES  Aged Out    Health Maintenance  Health Maintenance Due  Topic Date Due   DTaP/Tdap/Td (3 - Tdap) 12/18/2017   DEXA SCAN  06/12/2022   COVID-19 Vaccine (7 - 2023-24 season) 10/12/2022    Colorectal cancer screening: No longer required.   Mammogram status: Completed 01/04/2023. Repeat every year  Bone Density status: Ordered 03/15/2023. Pt provided with contact info and advised to call to schedule appt.  Lung Cancer Screening: (Low Dose CT Chest recommended if Age 67-80 years, 30 pack-year currently smoking OR have quit w/in 15years.) does not qualify.  Lung Cancer Screening Referral: No  Additional Screening:  Hepatitis C Screening: does not qualify; Completed: No  Vision Screening: Recommended annual ophthalmology exams for early detection of glaucoma and other disorders of the eye. Is the patient up to date with their annual eye exam?  Yes  Who is the provider or what is the name of the office in which the patient attends annual eye exams? Dr. Blair Hailey If pt is not established with a provider, would they like to be referred to a provider to establish care? No .   Dental Screening: Recommended annual dental exams for proper oral hygiene  Community Resource Referral / Chronic Care Management: CRR required this visit?  No   CCM required this visit?  No       Plan:     I have personally reviewed and noted the following in the patient's chart:   Medical and social history Use of alcohol, tobacco or illicit drugs  Current medications and supplements including opioid prescriptions. Patient is not currently taking opioid prescriptions. Functional ability and status Nutritional status Physical activity Advanced directives List of other physicians Hospitalizations, surgeries, and ER visits in previous 12 months Vitals Screenings to include cognitive, depression, and falls Referrals and appointments  In addition, I have reviewed and discussed with patient certain preventive protocols, quality metrics, and best practice recommendations. A written personalized care plan for preventive services as well as general preventive health recommendations were provided to patient.     Mickeal Needy, LPN   12/18/5619   Nurse Notes:  Patient is cogitatively intact via telephone conversation. There were no vitals filed for this visit. Patient stated that she has no issues with gait or balance; does not use any assistive devices. Medications reviewed with patient; no opioid use noted.

## 2023-03-15 NOTE — Telephone Encounter (Signed)
Patient stated that she has not been called in for her Prolia injection and she is overdue.  Also her order for bone density back in 2023 was never scheduled.  A new order for dexa scan was placed today during AWV.  Please check on status of pre-auth for Prolia and contact patient on [phone].  Joy Stemm N. Raney Koeppen, LPN. [facility] AWV Team Direct Dial: [phone]

## 2023-03-16 NOTE — Telephone Encounter (Signed)
Called and spoke with patient this morning.  Message has been sent to PA team and patient informed I will call her once approved to schedule her next injection.

## 2023-03-17 ENCOUNTER — Ambulatory Visit: Payer: Medicare PPO | Admitting: Pulmonary Disease

## 2023-03-17 ENCOUNTER — Encounter: Payer: Self-pay | Admitting: Pulmonary Disease

## 2023-03-17 VITALS — BP 126/82 | HR 59 | Ht 61.0 in | Wt 157.0 lb

## 2023-03-17 DIAGNOSIS — J449 Chronic obstructive pulmonary disease, unspecified: Secondary | ICD-10-CM | POA: Diagnosis not present

## 2023-03-17 DIAGNOSIS — R918 Other nonspecific abnormal finding of lung field: Secondary | ICD-10-CM | POA: Diagnosis not present

## 2023-03-17 DIAGNOSIS — J9811 Atelectasis: Secondary | ICD-10-CM | POA: Diagnosis not present

## 2023-03-17 MED ORDER — ALBUTEROL SULFATE HFA 108 (90 BASE) MCG/ACT IN AERS: 2.0000 | INHALATION_SPRAY | Freq: Four times a day (QID) | RESPIRATORY_TRACT | 6 refills | Status: DC | PRN

## 2023-03-17 NOTE — Progress Notes (Signed)
Synopsis: Referred in April 2023 for COPD by Cheryll Cockayne, MD  Subjective:   PATIENT ID: Joy Martin GENDER: female DOB: [birth year], MRN: [id number]  HPI  Chief Complaint  Patient presents with   Follow-up    6 mo f/u. States she has been stable since last visit. Still using Trelegy once daily.    Joy Martin is an 82 year old woman, former smoker with CKD, GERD, and hypertension who returns to pulmonary clinic for COPD.   She has been doing well since last visit. She does notice exertional dyspnea. She using trelegy daily and does not have an albuterol inhaler.   Reviewed CT Chest 5/6 with her and that I did not notice any significant changes from the last scan but I am waiting for radiology to read the CT scan.   OV 09/13/22 She has been doing well on trelegy ellipta 1 puff daily. She denies any mucous production or hemoptysis. She denies weight loss.   CT Chest scan shows emphysematous changes and complete left upper lobe atelectasis with air bronchograms/bronchiectasis with narrowing of the left upper lobe bronchus narrowing. Patchy areas of tree-in-bud findings along with areas of bronchiectasis and airspace nodularity. Two adjacent irregular nodules in the superior segment of the right upper lobe. No mediastinal adenopathy. There is a large hiatal hernia.   Initial OV 02/12/22 Last seen in 2014 by Dr. Sherene Sires. PFTs in 2014 showed mild obstructive defect. 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   She is currently on wixela inhub 100-72mcg. She experience mainly exertional shortness of breath. Denies cough, fevers or chills. She may have sweats from time to time. She denies weight loss. She has chronic sinus congestion and post-nasal drainage. She is using as needed flonase. Denies GERD.  She lives alone. She quit smoking in the 80s. She is a retired Runner, broadcasting/film/video.   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   removed right upper lip/perinostril Minus Liberty ([facility])   Chronic sinusitis    follows with ENT for same   CKD (chronic kidney disease) 2018   COPD (chronic obstructive pulmonary disease) (HCC) dx 2013   GOLD 1, follows with pulm for same   Episodic tension type headache    EXOGENOUS OBESITY    GERD    HYPERLIPIDEMIA    Hypertension    Migraines 07/10/2012   "over; last one was @ age 74"   OSTEOPENIA    Pneumonia 2018-2019   x 2     Family History  Problem Relation Age of Onset   Prostate cancer Father    Cancer Father        prostate   Cancer Mother        waldenstruns microgobular anemia   Hyperparathyroidism Mother    Alcohol abuse Other    Heart disease Other    Hypertension Other    Diabetes Other    Pancreatic cancer Other    Hyperparathyroidism Sister    Cancer Maternal Grandfather        pancreatic     Social History   Socioeconomic History   Marital status: Single    Spouse name: Not on file   Number of children: 0   Years of education: 16   Highest education level: Not on file  Occupational History  Comment: retired Runner, broadcasting/film/video, 5th grade  Tobacco Use   Smoking status: Former    Packs/day: 0.75    Years: 20.00    Additional pack years: 0.00    Total pack years: 15.00    Types: Cigarettes    Quit date: 07/25/1982    Years since quitting: 40.6   Smokeless tobacco: Never   Tobacco comments:    07/10/2012 "stopped smoking cigarettes 1980's"  Substance and Sexual Activity   Alcohol use: Yes    Comment: 07/10/2012 "glass of wine 1-2X/yr"   Drug use: No   Sexual activity: Never  Other Topics Concern   Not on file  Social History Narrative   Retired Runner, broadcasting/film/video, 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   Caffeine- coffee 1 cup, some Coke   Social Determinants of Health   Financial Resource Strain: Low Risk  (03/15/2023)   Overall Financial Resource Strain (CARDIA)    Difficulty of  Paying Living Expenses: Not hard at all  Food Insecurity: No Food Insecurity (03/15/2023)   Hunger Vital Sign    Worried About Running Out of Food in the Last Year: Never true    Ran Out of Food in the Last Year: Never true  Transportation Needs: No Transportation Needs (03/15/2023)   PRAPARE - Administrator, Civil Service (Medical): No    Lack of Transportation (Non-Medical): No  Physical Activity: Inactive (03/15/2023)   Exercise Vital Sign    Days of Exercise per Week: 0 days    Minutes of Exercise per Session: 0 min  Stress: No Stress Concern Present (03/15/2023)   [employer] of Occupational Health - Occupational Stress Questionnaire    Feeling of Stress : Not at all  Social Connections: Moderately Integrated (03/15/2023)   Social Connection and Isolation Panel [NHANES]    Frequency of Communication with Friends and Family: Three times a week    Frequency of Social Gatherings with Friends and Family: Three times a week    Attends Religious Services: More than 4 times per year    Active Member of Clubs or Organizations: Yes    Attends Banker Meetings: More than 4 times per year    Marital Status: Never married  Intimate Partner Violence: Not At Risk (03/15/2023)   Humiliation, Afraid, Rape, and Kick questionnaire    Fear of Current or Ex-Partner: No    Emotionally Abused: No    Physically Abused: No    Sexually Abused: No     Allergies  Allergen Reactions   Allegra [Fexofenadine Hcl] Other (See Comments)    Headache, "took the pill; I got flu symptoms"   Prilosec [Omeprazole]     Fecal incontince   Ranitidine     Fecal incontince     Outpatient Medications Prior to Visit  Medication Sig Dispense Refill   amLODipine (NORVASC) 5 MG tablet TAKE 1 TABLET BY MOUTH EVERY DAY 90 tablet 0   aspirin-acetaminophen-caffeine (EXCEDRIN MIGRAINE) 250-250-65 MG tablet Take 2 tablets by mouth every 6 (six) hours as needed for headache.     atorvastatin (LIPITOR)  20 MG tablet Take 1 tablet (20 mg total) by mouth daily. 90 tablet 3   cholecalciferol (VITAMIN D3) 25 MCG (1000 UNIT) tablet Take 2,000 Units by mouth daily.     Cranberry 500 MG CAPS Take 500 mg by mouth daily.     cycloSPORINE (RESTASIS) 0.05 % ophthalmic emulsion 1 drop 2 (two) times daily.     denosumab (PROLIA) 60 MG/ML SOSY injection Inject 60 mg into  the skin every 6 (six) months.     Erenumab-aooe (AIMOVIG) 70 MG/ML SOAJ INJECT 70MG  INTO THE SKIN EVERY 30 DAYS 3 mL 3   esomeprazole (NEXIUM) 40 MG capsule TAKE 1 CAPSULE BY MOUTH DAILY AT 12 NOON. 90 capsule 1   famotidine (PEPCID) 40 MG tablet Take 1 tablet (40 mg total) by mouth daily. 90 tablet 2   Flaxseed, Linseed, (FLAXSEED OIL PO) Take 2 tablets by mouth daily.     Fluticasone-Umeclidin-Vilant (TRELEGY ELLIPTA) 100-62.5-25 MCG/ACT AEPB Inhale 1 puff into the lungs daily. 2 each 5   nadolol (CORGARD) 40 MG tablet TAKE 1 TABLET BY MOUTH TWICE A DAY 180 tablet 1   valsartan (DIOVAN) 320 MG tablet Take 1 tablet (320 mg total) by mouth daily. 90 tablet 2   No facility-administered medications prior to visit.    Review of Systems  Constitutional:  Negative for chills, fever, malaise/fatigue and weight loss.  HENT:  Negative for congestion, sinus pain and sore throat.   Eyes: Negative.   Respiratory:  Positive for shortness of breath. Negative for cough, hemoptysis, sputum production and wheezing.   Cardiovascular:  Negative for chest pain, palpitations, orthopnea, claudication and leg swelling.  Gastrointestinal:  Negative for abdominal pain, heartburn, nausea and vomiting.  Genitourinary: Negative.   Musculoskeletal:  Negative for joint pain and myalgias.  Skin:  Negative for rash.  Neurological:  Negative for weakness.  Endo/Heme/Allergies: Negative.   Psychiatric/Behavioral: Negative.     Objective:   Vitals:   03/17/23 1000  BP: 126/82  Pulse: (!) 59  SpO2: 96%  Weight: 157 lb (71.2 kg)  Height: 5\' 1"  (1.549 m)    Physical Exam Constitutional:      General: She is not in acute distress.    Appearance: She is not ill-appearing.  HENT:     Head: Normocephalic and atraumatic.  Eyes:     Conjunctiva/sclera: Conjunctivae normal.  Cardiovascular:     Rate and Rhythm: Normal rate and regular rhythm.     Pulses: Normal pulses.     Heart sounds: Normal heart sounds. No murmur heard. Pulmonary:     Effort: Pulmonary effort is normal.     Breath sounds: Normal breath sounds. Decreased air movement present. No wheezing, rhonchi or rales.  Musculoskeletal:     Right lower leg: No edema.     Left lower leg: No edema.  Skin:    General: Skin is warm and dry.  Neurological:     General: No focal deficit present.     Mental Status: She is alert.    CBC    Component Value Date/Time   WBC 8.3 12/31/2022 0937   RBC 4.07 12/31/2022 0937   HGB 12.4 12/31/2022 0937   HCT 38.1 12/31/2022 0937   PLT 271.0 12/31/2022 0937   MCV 93.5 12/31/2022 0937   MCH 30.0 06/03/2020 1153   MCHC 32.6 12/31/2022 0937   RDW 15.6 (H) 12/31/2022 0937   LYMPHSABS 1.0 12/31/2022 0937   MONOABS 0.5 12/31/2022 0937   EOSABS 0.3 12/31/2022 0937   BASOSABS 0.1 12/31/2022 0937      Latest Ref Rng & Units 12/31/2022    9:37 AM 07/01/2022   11:21 AM 12/31/2021   10:52 AM  BMP  Glucose 70 - 99 mg/dL 90  161  90   BUN 6 - 23 mg/dL 26  26  26    Creatinine 0.40 - 1.20 mg/dL 0.96  0.45  4.09   Sodium 135 - 145 mEq/L 143  141  142   Potassium 3.5 - 5.1 mEq/L 5.0  5.0  5.1   Chloride 96 - 112 mEq/L 107  107  109   CO2 19 - 32 mEq/L 28  29  29    Calcium 8.4 - 10.5 mg/dL 64.4  9.9  03.4    Chest imaging: CT Chest 09/08/22 1. Underlying emphysematous changes and areas of pulmonary scarring. 2. Complete left upper lobe atelectasis with air bronchograms/bronchiectasis. The left upper lobe bronchus is severely narrowed and irregular. Findings likely due to chronic infection. I do not see an obstructing mass. 3. Patchy areas of  tree-in-bud findings along with areas of bronchiectasis and airspace nodularity most likely due to chronic inflammation or atypical infection such as MAC. 4. Two adjacent irregular nodules in the superior segment of the right lower lobe are most likely part of the same process as there is surrounding inflammatory/interstitial changes. 5. No mediastinal or hilar mass or adenopathy. 6. Recommend pulmonary consultation, appropriate treatment and follow-up noncontrast chest CT in 3-6 months. 7. Large hiatal hernia.  CXR 12/03/21 Similar appearance of the chest x-ray to the prior, with advanced emphysema and changes of lungs which most likely represent chronic atypical infection such as MAC based on the prior CT. No definite evidence of new superimposed airspace disease.   Hiatal hernia  PFT:     No data to display         Labs:  Path:  Echo 11/26/21: LV EF 60-65%. Grade I diastolic dysfunction. RV size and systolic function are normal. LA moderately dilated.   Heart Catheterization:  Assessment & Plan:   Chronic obstructive pulmonary disease, unspecified COPD type (HCC) - Plan: albuterol (VENTOLIN HFA) 108 (90 Base) MCG/ACT inhaler  Atelectasis  Pulmonary nodules  Discussion: Joy Martin is an 82 year old woman, former smoker with CKD, GERD, and hypertension who returns to pulmonary clinic for COPD.   She has mild obstructive defect based on PFTs from 2014. She is to continue trelelgy ellipta 1 puff daily. Will send in as needed albuterol inhaler.  No concern for symptomatic MAC infection at this time. CT Chest appears stable based on my review. Will wait for final radiology read and let her know the results.  Follow up in 1 year.  Melody Comas, MD [facility] Office: [phone]   Current Outpatient Medications:    albuterol (VENTOLIN HFA) 108 (90 Base) MCG/ACT inhaler, Inhale 2 puffs into the lungs every 6 (six) hours as needed for  wheezing or shortness of breath., Disp: 8 g, Rfl: 6   amLODipine (NORVASC) 5 MG tablet, TAKE 1 TABLET BY MOUTH EVERY DAY, Disp: 90 tablet, Rfl: 0   aspirin-acetaminophen-caffeine (EXCEDRIN MIGRAINE) 250-250-65 MG tablet, Take 2 tablets by mouth every 6 (six) hours as needed for headache., Disp: , Rfl:    atorvastatin (LIPITOR) 20 MG tablet, Take 1 tablet (20 mg total) by mouth daily., Disp: 90 tablet, Rfl: 3   cholecalciferol (VITAMIN D3) 25 MCG (1000 UNIT) tablet, Take 2,000 Units by mouth daily., Disp: , Rfl:    Cranberry 500 MG CAPS, Take 500 mg by mouth daily., Disp: , Rfl:    cycloSPORINE (RESTASIS) 0.05 % ophthalmic emulsion, 1 drop 2 (two) times daily., Disp: , Rfl:    denosumab (PROLIA) 60 MG/ML SOSY injection, Inject 60 mg into the skin every 6 (six) months., Disp: , Rfl:    Erenumab-aooe (AIMOVIG) 70 MG/ML SOAJ, INJECT 70MG  INTO THE SKIN EVERY 30 DAYS, Disp: 3 mL, Rfl:  3   esomeprazole (NEXIUM) 40 MG capsule, TAKE 1 CAPSULE BY MOUTH DAILY AT 12 NOON., Disp: 90 capsule, Rfl: 1   famotidine (PEPCID) 40 MG tablet, Take 1 tablet (40 mg total) by mouth daily., Disp: 90 tablet, Rfl: 2   Flaxseed, Linseed, (FLAXSEED OIL PO), Take 2 tablets by mouth daily., Disp: , Rfl:    Fluticasone-Umeclidin-Vilant (TRELEGY ELLIPTA) 100-62.5-25 MCG/ACT AEPB, Inhale 1 puff into the lungs daily., Disp: 2 each, Rfl: 5   nadolol (CORGARD) 40 MG tablet, TAKE 1 TABLET BY MOUTH TWICE A DAY, Disp: 180 tablet, Rfl: 1   valsartan (DIOVAN) 320 MG tablet, Take 1 tablet (320 mg total) by mouth daily., Disp: 90 tablet, Rfl: 2

## 2023-03-17 NOTE — Patient Instructions (Addendum)
Continue trelegy ellipta 1 puff daily - rinse mouth out after each use  Try albuterol inhaler 1-2 puffs every 4-6 hours as needed for shortness of breath  We will send you the CT scan results once radiology has read the scan  Follow up in 1 year

## 2023-03-19 ENCOUNTER — Other Ambulatory Visit: Payer: Self-pay | Admitting: Internal Medicine

## 2023-03-21 DIAGNOSIS — E213 Hyperparathyroidism, unspecified: Secondary | ICD-10-CM | POA: Diagnosis not present

## 2023-03-21 DIAGNOSIS — D631 Anemia in chronic kidney disease: Secondary | ICD-10-CM | POA: Diagnosis not present

## 2023-03-21 DIAGNOSIS — N1832 Chronic kidney disease, stage 3b: Secondary | ICD-10-CM | POA: Diagnosis not present

## 2023-03-21 DIAGNOSIS — I129 Hypertensive chronic kidney disease with stage 1 through stage 4 chronic kidney disease, or unspecified chronic kidney disease: Secondary | ICD-10-CM | POA: Diagnosis not present

## 2023-03-24 ENCOUNTER — Telehealth: Payer: Self-pay | Admitting: Pharmacy Technician

## 2023-03-24 NOTE — Telephone Encounter (Signed)
Prolia VOB initiated via [email or web address] 

## 2023-03-25 NOTE — Telephone Encounter (Signed)
Prolia VOB initiated via [email or web address]  Benefit verification in progress

## 2023-04-01 ENCOUNTER — Other Ambulatory Visit (HOSPITAL_COMMUNITY): Payer: Self-pay

## 2023-04-01 NOTE — Telephone Encounter (Signed)
Contacted patient and scheduled

## 2023-04-01 NOTE — Telephone Encounter (Signed)
Called [employer] to receive benefits. $20 copay, PA required and on file -   Authorization number: [id number] Effective: 04/22/20-11/08/23  Ref#: [id number]

## 2023-04-01 NOTE — Telephone Encounter (Signed)
Pt ready for scheduling for PROLIA on or after : 04/01/23  Out-of-pocket cost due at time of visit: $20 COPAY  Primary: [employer] Prolia co-insurance: $20 COPAY Admin fee co-insurance: ---  Secondary: --- Prolia co-insurance:  Admin fee co-insurance:   Medical Benefit Details: Date Benefits were checked: 03/28/23 Deductible: NO/ Coinsurance: $20/ Admin Fee: ---  Prior Auth: APPROVED PA# [id number] Expiration Date: 11/08/2023   Pharmacy benefit: Copay $64 If patient wants fill through the pharmacy benefit please send prescription to: [employer], and include estimated need by date in rx notes. Pharmacy will ship medication directly to the office.  Patient not eligible for Prolia Copay Card. Copay Card can make patient's cost as little as $25. Link to apply: [email or web address]  ** This summary of benefits is an estimation of the patient's out-of-pocket cost. Exact cost may very based on individual plan coverage. ;

## 2023-04-06 ENCOUNTER — Ambulatory Visit: Payer: Medicare PPO

## 2023-04-06 DIAGNOSIS — M810 Age-related osteoporosis without current pathological fracture: Secondary | ICD-10-CM | POA: Diagnosis not present

## 2023-04-06 MED ORDER — DENOSUMAB 60 MG/ML ~~LOC~~ SOSY
60.0000 mg | PREFILLED_SYRINGE | Freq: Once | SUBCUTANEOUS | Status: AC
  Administered 2023-04-06: 60 mg via SUBCUTANEOUS

## 2023-04-06 NOTE — Progress Notes (Signed)
After obtaining consent, and per orders of Dr. Lawerance Bach, injection of B12 given by Ferdie Ping. Patient instructed  to me immediately.

## 2023-04-08 DIAGNOSIS — H52223 Regular astigmatism, bilateral: Secondary | ICD-10-CM | POA: Diagnosis not present

## 2023-04-08 DIAGNOSIS — H524 Presbyopia: Secondary | ICD-10-CM | POA: Diagnosis not present

## 2023-04-08 DIAGNOSIS — H40023 Open angle with borderline findings, high risk, bilateral: Secondary | ICD-10-CM | POA: Diagnosis not present

## 2023-04-08 DIAGNOSIS — H353131 Nonexudative age-related macular degeneration, bilateral, early dry stage: Secondary | ICD-10-CM | POA: Diagnosis not present

## 2023-04-08 DIAGNOSIS — H35033 Hypertensive retinopathy, bilateral: Secondary | ICD-10-CM | POA: Diagnosis not present

## 2023-04-08 DIAGNOSIS — H04123 Dry eye syndrome of bilateral lacrimal glands: Secondary | ICD-10-CM | POA: Diagnosis not present

## 2023-04-18 NOTE — Progress Notes (Signed)
After obtaining consent, and per orders of Dr. Lawerance Bach, injection of Prolia was given by Ferdie Ping. Patient instructed to report any adverse reaction to me immediately.

## 2023-05-24 DIAGNOSIS — H40023 Open angle with borderline findings, high risk, bilateral: Secondary | ICD-10-CM | POA: Diagnosis not present

## 2023-05-24 DIAGNOSIS — Z961 Presence of intraocular lens: Secondary | ICD-10-CM | POA: Diagnosis not present

## 2023-05-24 DIAGNOSIS — H353131 Nonexudative age-related macular degeneration, bilateral, early dry stage: Secondary | ICD-10-CM | POA: Diagnosis not present

## 2023-05-24 DIAGNOSIS — H04123 Dry eye syndrome of bilateral lacrimal glands: Secondary | ICD-10-CM | POA: Diagnosis not present

## 2023-05-25 ENCOUNTER — Other Ambulatory Visit: Payer: Self-pay | Admitting: Internal Medicine

## 2023-05-31 ENCOUNTER — Other Ambulatory Visit (HOSPITAL_COMMUNITY): Payer: Self-pay | Admitting: Nephrology

## 2023-05-31 DIAGNOSIS — E213 Hyperparathyroidism, unspecified: Secondary | ICD-10-CM

## 2023-06-01 ENCOUNTER — Ambulatory Visit: Payer: Medicare PPO | Admitting: Family Medicine

## 2023-06-04 ENCOUNTER — Encounter (HOSPITAL_COMMUNITY): Payer: Self-pay

## 2023-06-04 ENCOUNTER — Inpatient Hospital Stay (HOSPITAL_COMMUNITY)
Admission: EM | Admit: 2023-06-04 | Discharge: 2023-06-06 | DRG: 378 | Disposition: A | Discharge status: Home / Self Care | Payer: Medicare PPO | Attending: Internal Medicine | Admitting: Internal Medicine

## 2023-06-04 ENCOUNTER — Emergency Department (HOSPITAL_COMMUNITY): Payer: Medicare PPO

## 2023-06-04 ENCOUNTER — Other Ambulatory Visit: Payer: Self-pay

## 2023-06-04 DIAGNOSIS — Z9049 Acquired absence of other specified parts of digestive tract: Secondary | ICD-10-CM | POA: Diagnosis not present

## 2023-06-04 DIAGNOSIS — Z888 Allergy status to other drugs, medicaments and biological substances status: Secondary | ICD-10-CM

## 2023-06-04 DIAGNOSIS — K449 Diaphragmatic hernia without obstruction or gangrene: Secondary | ICD-10-CM | POA: Diagnosis present

## 2023-06-04 DIAGNOSIS — G43909 Migraine, unspecified, not intractable, without status migrainosus: Secondary | ICD-10-CM | POA: Diagnosis present

## 2023-06-04 DIAGNOSIS — K922 Gastrointestinal hemorrhage, unspecified: Secondary | ICD-10-CM | POA: Diagnosis not present

## 2023-06-04 DIAGNOSIS — Z79899 Other long term (current) drug therapy: Secondary | ICD-10-CM | POA: Diagnosis not present

## 2023-06-04 DIAGNOSIS — N1831 Chronic kidney disease, stage 3a: Secondary | ICD-10-CM | POA: Diagnosis present

## 2023-06-04 DIAGNOSIS — E785 Hyperlipidemia, unspecified: Secondary | ICD-10-CM | POA: Diagnosis present

## 2023-06-04 DIAGNOSIS — Z85828 Personal history of other malignant neoplasm of skin: Secondary | ICD-10-CM | POA: Diagnosis not present

## 2023-06-04 DIAGNOSIS — J449 Chronic obstructive pulmonary disease, unspecified: Secondary | ICD-10-CM | POA: Diagnosis present

## 2023-06-04 DIAGNOSIS — R159 Full incontinence of feces: Secondary | ICD-10-CM | POA: Diagnosis not present

## 2023-06-04 DIAGNOSIS — K219 Gastro-esophageal reflux disease without esophagitis: Secondary | ICD-10-CM | POA: Diagnosis present

## 2023-06-04 DIAGNOSIS — E875 Hyperkalemia: Secondary | ICD-10-CM | POA: Diagnosis present

## 2023-06-04 DIAGNOSIS — R152 Fecal urgency: Secondary | ICD-10-CM | POA: Diagnosis not present

## 2023-06-04 DIAGNOSIS — R918 Other nonspecific abnormal finding of lung field: Secondary | ICD-10-CM | POA: Diagnosis present

## 2023-06-04 DIAGNOSIS — I728 Aneurysm of other specified arteries: Secondary | ICD-10-CM | POA: Diagnosis not present

## 2023-06-04 DIAGNOSIS — Z811 Family history of alcohol abuse and dependence: Secondary | ICD-10-CM

## 2023-06-04 DIAGNOSIS — I723 Aneurysm of iliac artery: Secondary | ICD-10-CM | POA: Diagnosis not present

## 2023-06-04 DIAGNOSIS — K625 Hemorrhage of anus and rectum: Secondary | ICD-10-CM | POA: Diagnosis not present

## 2023-06-04 DIAGNOSIS — K5733 Diverticulitis of large intestine without perforation or abscess with bleeding: Secondary | ICD-10-CM | POA: Diagnosis not present

## 2023-06-04 DIAGNOSIS — M8580 Other specified disorders of bone density and structure, unspecified site: Secondary | ICD-10-CM | POA: Diagnosis present

## 2023-06-04 DIAGNOSIS — Z87891 Personal history of nicotine dependence: Secondary | ICD-10-CM | POA: Diagnosis not present

## 2023-06-04 DIAGNOSIS — Z8719 Personal history of other diseases of the digestive system: Secondary | ICD-10-CM

## 2023-06-04 DIAGNOSIS — K5730 Diverticulosis of large intestine without perforation or abscess without bleeding: Secondary | ICD-10-CM | POA: Diagnosis not present

## 2023-06-04 DIAGNOSIS — Z7951 Long term (current) use of inhaled steroids: Secondary | ICD-10-CM

## 2023-06-04 DIAGNOSIS — I129 Hypertensive chronic kidney disease with stage 1 through stage 4 chronic kidney disease, or unspecified chronic kidney disease: Secondary | ICD-10-CM | POA: Diagnosis not present

## 2023-06-04 DIAGNOSIS — Z8042 Family history of malignant neoplasm of prostate: Secondary | ICD-10-CM

## 2023-06-04 DIAGNOSIS — I10 Essential (primary) hypertension: Secondary | ICD-10-CM

## 2023-06-04 DIAGNOSIS — Z7982 Long term (current) use of aspirin: Secondary | ICD-10-CM | POA: Diagnosis not present

## 2023-06-04 DIAGNOSIS — K644 Residual hemorrhoidal skin tags: Secondary | ICD-10-CM | POA: Diagnosis present

## 2023-06-04 DIAGNOSIS — D62 Acute posthemorrhagic anemia: Secondary | ICD-10-CM | POA: Diagnosis not present

## 2023-06-04 DIAGNOSIS — Z833 Family history of diabetes mellitus: Secondary | ICD-10-CM

## 2023-06-04 DIAGNOSIS — Z91048 Other nonmedicinal substance allergy status: Secondary | ICD-10-CM

## 2023-06-04 DIAGNOSIS — Z8349 Family history of other endocrine, nutritional and metabolic diseases: Secondary | ICD-10-CM

## 2023-06-04 DIAGNOSIS — Z8249 Family history of ischemic heart disease and other diseases of the circulatory system: Secondary | ICD-10-CM

## 2023-06-04 DIAGNOSIS — K5731 Diverticulosis of large intestine without perforation or abscess with bleeding: Principal | ICD-10-CM | POA: Diagnosis present

## 2023-06-04 DIAGNOSIS — Z800 Family history of malignant neoplasm of digestive organs: Secondary | ICD-10-CM

## 2023-06-04 LAB — CBC
HCT: 26.5 % — ABNORMAL LOW (ref 36.0–46.0)
Hemoglobin: 8.2 g/dL — ABNORMAL LOW (ref 12.0–15.0)
MCH: 29.2 pg (ref 26.0–34.0)
MCHC: 30.9 g/dL (ref 30.0–36.0)
MCV: 94.3 fL (ref 80.0–100.0)
Platelets: 352 10*3/uL (ref 150–400)
RBC: 2.81 MIL/uL — ABNORMAL LOW (ref 3.87–5.11)
RDW: 15.1 % (ref 11.5–15.5)
WBC: 11.1 10*3/uL — ABNORMAL HIGH (ref 4.0–10.5)
nRBC: 0 % (ref 0.0–0.2)

## 2023-06-04 LAB — URINALYSIS, ROUTINE W REFLEX MICROSCOPIC
Bacteria, UA: NONE SEEN
Bilirubin Urine: NEGATIVE
Glucose, UA: NEGATIVE mg/dL
Hgb urine dipstick: NEGATIVE
Ketones, ur: NEGATIVE mg/dL
Nitrite: NEGATIVE
Protein, ur: NEGATIVE mg/dL
Specific Gravity, Urine: 1.009 (ref 1.005–1.030)
pH: 6 (ref 5.0–8.0)

## 2023-06-04 LAB — TYPE AND SCREEN
ABO/RH(D): O POS
Antibody Screen: NEGATIVE

## 2023-06-04 LAB — BASIC METABOLIC PANEL
Anion gap: 8 (ref 5–15)
BUN: 25 mg/dL — ABNORMAL HIGH (ref 8–23)
CO2: 21 mmol/L — ABNORMAL LOW (ref 22–32)
Calcium: 8.8 mg/dL — ABNORMAL LOW (ref 8.9–10.3)
Chloride: 110 mmol/L (ref 98–111)
Creatinine, Ser: 1.25 mg/dL — ABNORMAL HIGH (ref 0.44–1.00)
GFR, Estimated: 43 mL/min — ABNORMAL LOW (ref >60)
Glucose, Bld: 133 mg/dL — ABNORMAL HIGH (ref 70–99)
Potassium: 4.2 mmol/L (ref 3.5–5.1)
Sodium: 139 mmol/L (ref 135–145)

## 2023-06-04 LAB — ABO/RH: ABO/RH(D): O POS

## 2023-06-04 LAB — CBG MONITORING, ED: Glucose-Capillary: 123 mg/dL — ABNORMAL HIGH (ref 70–99)

## 2023-06-04 LAB — POC OCCULT BLOOD, ED: Fecal Occult Bld: POSITIVE — AB

## 2023-06-04 MED ORDER — SODIUM CHLORIDE (PF) 0.9 % IJ SOLN
INTRAMUSCULAR | Status: AC
  Filled 2023-06-04: qty 50

## 2023-06-04 MED ORDER — PANTOPRAZOLE SODIUM 40 MG PO TBEC: 40.0000 mg | DELAYED_RELEASE_TABLET | Freq: Every day | ORAL | Status: DC

## 2023-06-04 MED ORDER — UMECLIDINIUM BROMIDE 62.5 MCG/ACT IN AEPB
1.0000 | INHALATION_SPRAY | Freq: Every day | RESPIRATORY_TRACT | Status: DC
  Administered 2023-06-05 – 2023-06-06 (×2): 1 via RESPIRATORY_TRACT
  Filled 2023-06-04: qty 7

## 2023-06-04 MED ORDER — ATORVASTATIN CALCIUM 10 MG PO TABS: 20.0000 mg | ORAL_TABLET | Freq: Every day | ORAL | Status: DC

## 2023-06-04 MED ORDER — ATORVASTATIN CALCIUM 10 MG PO TABS
20.0000 mg | ORAL_TABLET | Freq: Every day | ORAL | Status: DC
  Administered 2023-06-05 – 2023-06-06 (×2): 20 mg via ORAL
  Filled 2023-06-04 (×2): qty 2

## 2023-06-04 MED ORDER — ONDANSETRON HCL 4 MG PO TABS: 4.0000 mg | ORAL_TABLET | Freq: Four times a day (QID) | ORAL | Status: DC | PRN

## 2023-06-04 MED ORDER — NADOLOL 20 MG PO TABS
40.0000 mg | ORAL_TABLET | Freq: Two times a day (BID) | ORAL | Status: DC
  Administered 2023-06-05 – 2023-06-06 (×4): 40 mg via ORAL
  Filled 2023-06-04 (×5): qty 2

## 2023-06-04 MED ORDER — ACETAMINOPHEN 650 MG RE SUPP: 650.0000 mg | Freq: Four times a day (QID) | RECTAL | Status: DC | PRN

## 2023-06-04 MED ORDER — PANTOPRAZOLE 80MG IVPB - SIMPLE MED
80.0000 mg | Freq: Once | INTRAVENOUS | Status: AC
  Administered 2023-06-04: 80 mg via INTRAVENOUS
  Filled 2023-06-04: qty 80

## 2023-06-04 MED ORDER — ACETAMINOPHEN 325 MG PO TABS
650.0000 mg | ORAL_TABLET | Freq: Four times a day (QID) | ORAL | Status: DC | PRN
  Administered 2023-06-04 – 2023-06-06 (×3): 650 mg via ORAL
  Filled 2023-06-04 (×3): qty 2

## 2023-06-04 MED ORDER — FLUTICASONE FUROATE-VILANTEROL 100-25 MCG/ACT IN AEPB
1.0000 | INHALATION_SPRAY | Freq: Every day | RESPIRATORY_TRACT | Status: DC
  Administered 2023-06-05 – 2023-06-06 (×2): 1 via RESPIRATORY_TRACT
  Filled 2023-06-04: qty 28

## 2023-06-04 MED ORDER — PANTOPRAZOLE SODIUM 40 MG PO TBEC
40.0000 mg | DELAYED_RELEASE_TABLET | Freq: Every day | ORAL | Status: DC
  Administered 2023-06-05 – 2023-06-06 (×2): 40 mg via ORAL
  Filled 2023-06-04 (×2): qty 1

## 2023-06-04 MED ORDER — AMLODIPINE BESYLATE 5 MG PO TABS
5.0000 mg | ORAL_TABLET | Freq: Every day | ORAL | Status: DC
  Administered 2023-06-05 – 2023-06-06 (×2): 5 mg via ORAL
  Filled 2023-06-04 (×3): qty 1

## 2023-06-04 MED ORDER — ONDANSETRON HCL 4 MG/2ML IJ SOLN: 4.0000 mg | Freq: Four times a day (QID) | INTRAMUSCULAR | Status: DC | PRN

## 2023-06-04 MED ORDER — SODIUM CHLORIDE 0.9 % IV BOLUS
1000.0000 mL | Freq: Once | INTRAVENOUS | Status: AC
  Administered 2023-06-04: 1000 mL via INTRAVENOUS

## 2023-06-04 MED ORDER — OXYCODONE HCL 5 MG PO TABS
5.0000 mg | ORAL_TABLET | ORAL | Status: DC | PRN
  Administered 2023-06-04 – 2023-06-06 (×3): 5 mg via ORAL
  Filled 2023-06-04 (×3): qty 1

## 2023-06-04 MED ORDER — IOHEXOL 350 MG/ML SOLN
80.0000 mL | Freq: Once | INTRAVENOUS | Status: AC | PRN
  Administered 2023-06-04: 80 mL via INTRAVENOUS

## 2023-06-04 NOTE — ED Notes (Signed)
Unsuccessful IV attempt x2.  

## 2023-06-04 NOTE — ED Triage Notes (Signed)
Pt arrived POV. States she has blood in her stool earlier this week and felt dizzy. Reports that resolved until today had an episode of blood in stool and feels dizzy now. Denies CP, sob, abdominal pain, N/V or any other symptoms. AAOX4, Nad noted in triage. Resp even and unlabored.

## 2023-06-04 NOTE — H&P (Signed)
History and Physical    [name]:[id number] DOB: May 11, 1941 DOA: 06/04/2023  PCP: Pincus Sanes, MD   Chief Complaint: dizziness  HPI: Joy Martin is a 82 y.o. female with medical history significant of COPD, CKD, GERD, hypertension, hyperlipidemia who presented to the emergency department with bright red blood per rectum.  Patient had episode of diarrhea 1 week ago has had intermittent bloody stools over the last several days.  Today she had a large bloody bowel movement and felt lightheaded and dizzy so she presented to the ER for further assessment.  On arrival to emergency department she was afebrile hemodynamically stable.  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  Patient was admitted for further management of presumed lower GI bleed.  On evaluation patient states that she has had prior GI evaluations at [facility].  Last colonoscopy was over 10 years ago which was normal other than diverticulosis.   Review of Systems: Review of Systems  Gastrointestinal:  Positive for blood in stool.     As per HPI otherwise 10 point review of systems negative.   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   removed right upper lip/perinostril Minus Liberty ([facility])   Chronic sinusitis    follows with ENT for same   CKD (chronic kidney disease) 2018   COPD (chronic obstructive pulmonary disease) (HCC) dx 2013   GOLD 1, follows with pulm for same   Episodic  tension type headache    EXOGENOUS OBESITY    GERD    HYPERLIPIDEMIA    Hypertension    Migraines 07/10/2012   "over; last one was @ age 12"   OSTEOPENIA    Pneumonia 2018-2019   x 2    Past Surgical History:  Procedure Laterality Date   APPENDECTOMY  07/10/2012   BREAST CYST ASPIRATION  ?1980's   right   EYE SURGERY     both eyes   KNEE ARTHROSCOPY  1970's or 1980's   right; torn cartilage   LAPAROSCOPIC APPENDECTOMY  07/10/2012   Procedure: APPENDECTOMY LAPAROSCOPIC;  Surgeon: Shelly Rubenstein, MD;  Location: [facility] OR;  Service: General;  Laterality: N/A;   MOHS SURGERY  01/2016   nose   MOLE REMOVAL  1957   "my back"   SKIN CANCER EXCISION     "2 on my back; 2 on my face"   TONSILLECTOMY AND ADENOIDECTOMY  1946   WISDOM TOOTH EXTRACTION  1959     reports that she quit smoking about 40 years ago. Her smoking use included cigarettes. She started smoking about 60 years ago. She has a 15 pack-year smoking history. She has never used smokeless tobacco. She reports current alcohol use. She reports that she does not use drugs.  Family History  Problem Relation Age of Onset   Prostate cancer Father    Cancer Father        prostate   Cancer  Mother        waldenstruns microgobular anemia   Hyperparathyroidism Mother    Alcohol abuse Other    Heart disease Other    Hypertension Other    Diabetes Other    Pancreatic cancer Other    Hyperparathyroidism Sister    Cancer Maternal Grandfather        pancreatic    Prior to Admission medications   Medication Sig Start Date End Date Taking? Authorizing Provider  albuterol (VENTOLIN HFA) 108 (90 Base) MCG/ACT inhaler Inhale 2 puffs into the lungs every 6 (six) hours as needed for wheezing or shortness of breath. 03/17/23   Martina Sinner, MD  amLODipine (NORVASC) 5 MG tablet TAKE 1 TABLET BY MOUTH EVERY DAY 03/21/23   Pincus Sanes, MD  aspirin-acetaminophen-caffeine (EXCEDRIN MIGRAINE) [phone] MG tablet Take 2 tablets by mouth  every 6 (six) hours as needed for headache.    [provider]  atorvastatin (LIPITOR) 20 MG tablet Take 1 tablet (20 mg total) by mouth daily. 07/01/22   Pincus Sanes, MD  cholecalciferol (VITAMIN D3) 25 MCG (1000 UNIT) tablet Take 2,000 Units by mouth daily.    [provider]  Cranberry 500 MG CAPS Take 500 mg by mouth daily.    [provider]  cycloSPORINE (RESTASIS) 0.05 % ophthalmic emulsion 1 drop 2 (two) times daily.    [provider]  denosumab (PROLIA) 60 MG/ML SOSY injection Inject 60 mg into the skin every 6 (six) months.    [provider]  Erenumab-aooe (AIMOVIG) 70 MG/ML SOAJ INJECT 70MG  INTO THE SKIN EVERY 30 DAYS 05/26/22   Lomax, Amy, NP  esomeprazole (NEXIUM) 40 MG capsule TAKE 1 CAPSULE BY MOUTH DAILY AT 12 NOON. 02/24/23   Pincus Sanes, MD  famotidine (PEPCID) 40 MG tablet TAKE 1 TABLET BY MOUTH EVERY DAY 03/21/23   Burns, Bobette Mo, MD  Flaxseed, Linseed, (FLAXSEED OIL PO) Take 2 tablets by mouth daily.    [provider]  Fluticasone-Umeclidin-Vilant (TRELEGY ELLIPTA) 100-62.5-25 MCG/ACT AEPB Inhale 1 puff into the lungs daily. 07/23/22   Martina Sinner, MD  nadolol (CORGARD) 40 MG tablet TAKE 1 TABLET BY MOUTH TWICE A DAY 05/25/23   Pincus Sanes, MD  valsartan (DIOVAN) 320 MG tablet TAKE 1 TABLET BY MOUTH EVERY DAY 03/21/23   Pincus Sanes, MD  Calcium Carbonate (CALCIUM 500 PO) Take by mouth.    04/06/18  [provider]    Physical Exam: Vitals:   06/04/23 1540 06/04/23 1600 06/04/23 1747 06/04/23 1830  BP: (!) 153/74 116/61 (!) 140/64 133/67  Pulse: 72 67 76 78  Resp: 17 (!) 21 14 17   Temp:   98.1 F (36.7 C)   TempSrc:   Oral   SpO2: 97% 96% 99% 97%  Weight:      Height:       Physical Exam Constitutional:      Appearance: She is normal weight.  HENT:     Head: Normocephalic.     Nose: Nose normal.     Mouth/Throat:     Mouth: Mucous membranes are moist.     Pharynx: Oropharynx is clear.   Eyes:     Pupils: Pupils are equal, round, and reactive to light.  Cardiovascular:     Rate and Rhythm: Normal rate and regular rhythm.     Pulses: Normal pulses.     Heart sounds: Normal heart sounds.  Pulmonary:     Effort: Pulmonary effort is  normal.     Breath sounds: Normal breath sounds.  Abdominal:     General: Abdomen is flat. Bowel sounds are normal.  Musculoskeletal:        General: Normal range of motion.     Cervical back: Normal range of motion.  Skin:    General: Skin is warm.     Capillary Refill: Capillary refill takes less than 2 seconds.  Neurological:     General: No focal deficit present.     Mental Status: She is alert.  Psychiatric:        Mood and Affect: Mood normal.      Labs on Admission: I have personally reviewed the patients's labs and imaging studies.  Assessment/Plan Principal Problem:   History of GI diverticular bleed   # Lower GI bleed most likely due to diverticular hemorrhage, POA, active - Hemoglobin dropped from 12-8.  She was remained hemodynamically stable - Previously seen by [facility] was consulted in the ER  Plan: Placed on clear liquid diet Trend hemoglobin every 6 hours IV resuscitation to achieve normal hemodynamics  # Hypertension-continue amlodipine, nadolol  # GERD-continue PPI  #COPD- continue inhalers  #Hld- continue lipitor  Admission status: Inpatient Telemetry  Certification: The appropriate patient status for this patient is INPATIENT. Inpatient status is judged to be reasonable and necessary in order to provide the required intensity of service to ensure the patient's safety. The patient's presenting symptoms, physical exam findings, and initial radiographic and laboratory data in the context of their chronic comorbidities is felt to place them at high risk for further clinical deterioration. Furthermore, it is not anticipated that the patient will be medically stable for discharge from the hospital within 2  midnights of admission.   * I certify that at the point of admission it is my clinical judgment that the patient will require inpatient hospital care spanning beyond 2 midnights from the point of admission due to high intensity of service, high risk for further deterioration and high frequency of surveillance required.Alan Mulder MD [facility] If 7PM-7AM, please contact night-coverage [email or web address]  06/04/2023, 7:57 PM

## 2023-06-04 NOTE — ED Notes (Signed)
ED TO INPATIENT HANDOFF REPORT  Name/Age/Gender Joy Martin 82 y.o. female  Code Status Code Status History     Date Active Date Inactive Code Status Order ID Comments User Context   07/10/2012 1852 07/11/2012 1357 Full Code [id number]  Bayard Beaver, RN Inpatient       Home/SNF/Other Home  Chief Complaint History of GI diverticular bleed [Z87.19]  Level of Care/Admitting Diagnosis ED Disposition     ED Disposition  Admit   Condition  --   Comment  Hospital Area: [facility] [[id number]]  Level of Care: Telemetry [5]  Admit to tele based on following criteria: Other see comments  Comments: GI bleed  May admit patient to [facility] or [facility] if equivalent level of care is available:: Yes  Covid Evaluation: Asymptomatic - no recent exposure (last 10 days) testing not required  Diagnosis: History of GI diverticular bleed [540981]  Admitting Physician: Alan Mulder [[id number]]  Attending Physician: Alan Mulder [[id number]]  Certification:: I certify this patient will need inpatient services for at least 2 midnights  Estimated Length of Stay: 3          Medical History 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   removed right upper lip/perinostril [place] [place] ([facility])   Chronic sinusitis    follows with ENT for same   CKD (chronic kidney disease) 2018   COPD (chronic obstructive pulmonary disease) (HCC) dx 2013   GOLD 1, follows with pulm for same   Episodic tension type headache    EXOGENOUS OBESITY    GERD    HYPERLIPIDEMIA    Hypertension    Migraines 07/10/2012   "over; last one was @ age 55"   OSTEOPENIA    Pneumonia 2018-2019   x 2    Allergies Allergies  Allergen Reactions   Allegra [Fexofenadine Hcl] Other (See Comments)    Headache, "took the pill; I got flu symptoms"   Prilosec [Omeprazole]      Fecal incontince   Ranitidine     Fecal incontince    IV Location/Drains/Wounds Patient Lines/Drains/Airways Status     Active Line/Drains/Airways     Name Placement date Placement time Site Days   Peripheral IV 06/04/23 20 G Right Antecubital 06/04/23  1557  Antecubital  less than 1   Incision - 3 Ports Abdomen 1: Right 2: Umbilicus 3: Left 07/10/12  1719  -- 3981            Labs/Imaging Results for orders placed or performed during the hospital encounter of 06/04/23 (from the past 48 hour(s))  Basic metabolic panel     Status: Abnormal   Collection Time: 06/04/23  1:47 PM  Result Value Ref Range   Sodium 139 135 - 145 mmol/L   Potassium 4.2 3.5 - 5.1 mmol/L   Chloride 110 98 - 111 mmol/L   CO2 21 (L) 22 - 32 mmol/L   Glucose, Bld 133 (H) 70 - 99 mg/dL    Comment: Glucose reference range applies only to samples taken after fasting for at least 8 hours.   BUN 25 (H) 8 - 23 mg/dL   Creatinine, Ser 8.65 (H) 0.44 - 1.00 mg/dL   Calcium 8.8 (L) 8.9 - 10.3 mg/dL   GFR, Estimated 43 (L) >60 mL/min    Comment: (NOTE) Calculated using the CKD-EPI Creatinine  Equation (2021)    Anion gap 8 5 - 15    Comment: Performed at [facility], [address] [place], [place] [address]  CBC     Status: Abnormal   Collection Time: 06/04/23  1:47 PM  Result Value Ref Range   WBC 11.1 (H) 4.0 - 10.5 K/uL   RBC 2.81 (L) 3.87 - 5.11 MIL/uL   Hemoglobin 8.2 (L) 12.0 - 15.0 g/dL   HCT 69.6 (L) 29.5 - 28.4 %   MCV 94.3 80.0 - 100.0 fL   MCH 29.2 26.0 - 34.0 pg   MCHC 30.9 30.0 - 36.0 g/dL   RDW 13.2 44.0 - 10.2 %   Platelets 352 150 - 400 K/uL   nRBC 0.0 0.0 - 0.2 %    Comment: Performed at [facility], [address], [place], [place] [address]  ABO/Rh     Status: None   Collection Time: 06/04/23  1:53 PM  Result Value Ref Range   ABO/RH(D)      O POS Performed at [facility], [address] [place], [place] [address]    Urinalysis, Routine w reflex microscopic -     Status: Abnormal   Collection Time: 06/04/23  3:00 PM  Result Value Ref Range   Color, Urine STRAW (A) YELLOW   APPearance CLEAR CLEAR   Specific Gravity, Urine 1.009 1.005 - 1.030   pH 6.0 5.0 - 8.0   Glucose, UA NEGATIVE NEGATIVE mg/dL   Hgb urine dipstick NEGATIVE NEGATIVE   Bilirubin Urine NEGATIVE NEGATIVE   Ketones, ur NEGATIVE NEGATIVE mg/dL   Protein, ur NEGATIVE NEGATIVE mg/dL   Nitrite NEGATIVE NEGATIVE   Leukocytes,Ua TRACE (A) NEGATIVE   RBC / HPF 0-5 0 - 5 RBC/hpf   WBC, UA 6-10 0 - 5 WBC/hpf   Bacteria, UA NONE SEEN NONE SEEN   Squamous Epithelial / HPF 0-5 0 - 5 /HPF    Comment: Performed at [facility], [address] [place], [place] [address]  CBG monitoring, ED     Status: Abnormal   Collection Time: 06/04/23  3:20 PM  Result Value Ref Range   Glucose-Capillary 123 (H) 70 - 99 mg/dL    Comment: Glucose reference range applies only to samples taken after fasting for at least 8 hours.  POC occult blood, ED     Status: Abnormal   Collection Time: 06/04/23  3:28 PM  Result Value Ref Range   Fecal Occult Bld POSITIVE (A) NEGATIVE  Type and screen     Status: None   Collection Time: 06/04/23  3:50 PM  Result Value Ref Range   ABO/RH(D) O POS    Antibody Screen NEG    Sample Expiration      06/07/2023,2359 Performed at [facility], [address], [place], [place] [address]    CT Angio Abd/Pel W and/or Wo Contrast  Result Date: 06/04/2023 CLINICAL DATA:  Lower GI bleed dizziness EXAM: CTA ABDOMEN AND PELVIS WITHOUT AND WITH CONTRAST TECHNIQUE: Multidetector CT imaging of the abdomen and pelvis was performed using the standard protocol during bolus administration of intravenous contrast. Multiplanar reconstructed images and MIPs were obtained and reviewed to evaluate the vascular anatomy. RADIATION DOSE REDUCTION: This exam was performed according to the departmental  dose-optimization program which includes automated exposure control, adjustment of the mA and/or kV according to patient size and/or use of iterative reconstruction technique. CONTRAST:  80mL OMNIPAQUE IOHEXOL 350 MG/ML SOLN COMPARISON:  CT 07/10/2012 FINDINGS: VASCULAR Aorta: Non contrasted images demonstrate no acute intramural hematoma. Negative for aortic dissection, aneurysm, or significant stenosis. Celiac: Patent without evidence of aneurysm, dissection, vasculitis or significant stenosis. 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 SMA: Patent without evidence of aneurysm, dissection, vasculitis or significant stenosis. Renals: Both renal arteries are patent without evidence of aneurysm, dissection, vasculitis, fibromuscular dysplasia or significant stenosis. IMA: Patent without evidence of aneurysm, dissection, vasculitis or significant stenosis. 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 No dissection. Patent internal and external iliac vessels. Proximal Outflow: Bilateral common femoral and visualized portions of the superficial and profunda femoral arteries are patent without evidence of aneurysm, dissection, vasculitis or significant stenosis. Veins: No obvious venous abnormality within the limitations of this arterial phase study. Review of the MIP images confirms the above findings. NON-VASCULAR Lower chest: Lung bases demonstrate scattered areas of minimal bronchiectasis and clustered nodularity. Punctate nodules measuring up to 4 mm at the lingula, series 19 image 4. Moderate hiatal hernia. Hepatobiliary: Distended gallbladder without calcified stone. No biliary dilatation. No focal hepatic abnormality. Pancreas: Unremarkable. No pancreatic ductal dilatation or surrounding inflammatory changes. Spleen: Normal in size without focal abnormality. Adrenals/Urinary Tract: Adrenal glands are normal. Small nonobstructing left kidney  stone. No hydronephrosis. Cyst upper pole left kidney, no imaging follow-up is recommended. The bladder is normal Stomach/Bowel: Stomach nonenlarged. No dilated small bowel. No acute bowel wall thickening. Diverticular disease of the colon without acute wall thickening. Slightly dense intraluminal contents within the descending colon and splenic flexure, but no evidence for active intraluminal extravasation to suggest active GI bleeding on this exam. Lymphatic: No suspicious lymph nodes Reproductive: Uterus and bilateral adnexa are unremarkable. Other: Negative for pelvic effusion or free air. Musculoskeletal: Grade 1 anterolisthesis L4 on L5 with advanced degenerative changes. IMPRESSION: 1. Negative for acute aortic dissection. No evidence for intraluminal contrast extravasation to suggest active GI bleeding at this time. 2. 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 3. Diverticular disease of the colon without acute wall thickening. 4. Moderate hiatal hernia. 5. Scattered areas of minimal bronchiectasis and clustered nodularity at the lung bases, possible chronic atypical infection. Punctate nodules measuring up to 4 mm at the lingula. No follow-up needed if patient is low-risk.This recommendation follows the consensus statement: Guidelines for Management of Incidental Pulmonary Nodules Detected on CT Images: From the Fleischner Society 2017; Radiology 2017; 284:228-243. Electronically Signed   By: Jasmine Pang M.D.   On: 06/04/2023 17:33    Pending Labs Unresulted Labs (From admission, onward)    None       Vitals/Pain Today's Vitals   06/04/23 1450 06/04/23 1540 06/04/23 1600 06/04/23 1747  BP:  (!) 153/74 116/61 (!) 140/64  Pulse:  72 67 76  Resp: 18 17 (!) 21 14  Temp:    98.1 F (36.7 C)  TempSrc:    Oral  SpO2:  97% 96% 99%  Weight:      Height:      PainSc:        Isolation Precautions No active isolations  Medications Medications  sodium  chloride 0.9 % bolus 1,000 mL (0 mLs Intravenous Stopped 06/04/23 1701)  pantoprazole (PROTONIX) 80 mg /NS 100 mL IVPB (0 mg Intravenous Stopped 06/04/23 1618)  iohexol (OMNIPAQUE) 350 MG/ML injection 80 mL (80 mLs Intravenous Contrast Given 06/04/23 1659)    Mobility walks

## 2023-06-04 NOTE — ED Provider Notes (Signed)
[place] EMERGENCY DEPARTMENT AT [facility] Provider Note   CSN: [id number] Arrival date & time: 06/04/23  1252     History  Chief Complaint  Patient presents with   Dizziness   Blood In Stools    Joy Martin is a 82 y.o. female history of reflux, hypertension, here presenting with blood in her stool.  Patient states that about 6 days ago she had an episode of diarrhea with bloody bowel movement.  Patient states that she was doing well until today.  She states that she felt that she needed to defecate and only blood came out.  Patient states that she feels lightheaded and dizzy.  Denies any abdominal pain.  Patient states that she does take Excedrin for migraines.  Denies being on blood thinners  The history is provided by the patient.       Home Medications Prior to Admission medications   Medication Sig Start Date End Date Taking? Authorizing Provider  albuterol (VENTOLIN HFA) 108 (90 Base) MCG/ACT inhaler Inhale 2 puffs into the lungs every 6 (six) hours as needed for wheezing or shortness of breath. 03/17/23   Martina Sinner, MD  amLODipine (NORVASC) 5 MG tablet TAKE 1 TABLET BY MOUTH EVERY DAY 03/21/23   Pincus Sanes, MD  aspirin-acetaminophen-caffeine (EXCEDRIN MIGRAINE) [phone] MG tablet Take 2 tablets by mouth every 6 (six) hours as needed for headache.    [provider]  atorvastatin (LIPITOR) 20 MG tablet Take 1 tablet (20 mg total) by mouth daily. 07/01/22   Pincus Sanes, MD  cholecalciferol (VITAMIN D3) 25 MCG (1000 UNIT) tablet Take 2,000 Units by mouth daily.    [provider]  Cranberry 500 MG CAPS Take 500 mg by mouth daily.    [provider]  cycloSPORINE (RESTASIS) 0.05 % ophthalmic emulsion 1 drop 2 (two) times daily.    [provider]  denosumab (PROLIA) 60 MG/ML SOSY injection Inject 60 mg into the skin every 6 (six) months.    [provider]  Erenumab-aooe (AIMOVIG) 70 MG/ML SOAJ INJECT  70MG  INTO THE SKIN EVERY 30 DAYS 05/26/22   Lomax, Amy, NP  esomeprazole (NEXIUM) 40 MG capsule TAKE 1 CAPSULE BY MOUTH DAILY AT 12 NOON. 02/24/23   Pincus Sanes, MD  famotidine (PEPCID) 40 MG tablet TAKE 1 TABLET BY MOUTH EVERY DAY 03/21/23   Burns, Bobette Mo, MD  Flaxseed, Linseed, (FLAXSEED OIL PO) Take 2 tablets by mouth daily.    [provider]  Fluticasone-Umeclidin-Vilant (TRELEGY ELLIPTA) 100-62.5-25 MCG/ACT AEPB Inhale 1 puff into the lungs daily. 07/23/22   Martina Sinner, MD  nadolol (CORGARD) 40 MG tablet TAKE 1 TABLET BY MOUTH TWICE A DAY 05/25/23   Pincus Sanes, MD  valsartan (DIOVAN) 320 MG tablet TAKE 1 TABLET BY MOUTH EVERY DAY 03/21/23   Pincus Sanes, MD  Calcium Carbonate (CALCIUM 500 PO) Take by mouth.    04/06/18  [provider]      Allergies    Allegra [fexofenadine hcl], Prilosec [omeprazole], and Ranitidine    Review of Systems   Review of Systems  Gastrointestinal:  Positive for blood in stool.  Neurological:  Positive for dizziness.  All other systems reviewed and are negative.   Physical Exam Updated Vital Signs BP 118/62   Pulse 74   Temp 98.4 F (36.9 C) (Oral)   Resp 18   Ht 5\' 1"  (1.549 m)   Wt 71.2 kg   SpO2 98%  BMI 29.66 kg/m  Physical Exam Vitals and nursing note reviewed.  Constitutional:      Comments: Chronically ill and slightly pale  HENT:     Head: Normocephalic.     Nose: Nose normal.     Mouth/Throat:     Mouth: Mucous membranes are moist.  Eyes:     Comments: Conjunctiva is pale  Cardiovascular:     Rate and Rhythm: Normal rate and regular rhythm.     Pulses: Normal pulses.     Heart sounds: Normal heart sounds.  Pulmonary:     Effort: Pulmonary effort is normal.     Breath sounds: Normal breath sounds.  Abdominal:     General: Abdomen is flat.     Palpations: Abdomen is soft.  Genitourinary:    Comments: Small external hemorrhoids with no obvious thrombosis.  Patient does have blood on rectal  exam Musculoskeletal:     Cervical back: Normal range of motion and neck supple.  Skin:    General: Skin is warm.     Capillary Refill: Capillary refill takes less than 2 seconds.  Neurological:     General: No focal deficit present.     Mental Status: She is oriented to person, place, and time.  Psychiatric:        Mood and Affect: Mood normal.        Behavior: Behavior normal.     ED Results / Procedures / Treatments   Labs (all labs ordered are listed, but only abnormal results are displayed) Labs Reviewed  BASIC METABOLIC PANEL - Abnormal; Notable for the following components:      Result Value   CO2 21 (*)    Glucose, Bld 133 (*)    BUN 25 (*)    Creatinine, Ser 1.25 (*)    Calcium 8.8 (*)    GFR, Estimated 43 (*)    All other components within normal limits  CBC - Abnormal; Notable for the following components:   WBC 11.1 (*)    RBC 2.81 (*)    Hemoglobin 8.2 (*)    HCT 26.5 (*)    All other components within normal limits  CBG MONITORING, ED - Abnormal; Notable for the following components:   Glucose-Capillary 123 (*)    All other components within normal limits  URINALYSIS, ROUTINE W REFLEX MICROSCOPIC  POC OCCULT BLOOD, ED  TYPE AND SCREEN    EKG EKG Interpretation Date/Time:  Saturday June 04 2023 13:19:26 EDT Ventricular Rate:  66 PR Interval:  166 QRS Duration:  138 QT Interval:  438 QTC Calculation: 459 R Axis:   84  Text Interpretation: Sinus rhythm Ventricular trigeminy Right bundle branch block No significant change since last tracing Confirmed by Richardean Canal [phone]) on 06/04/2023 3:04:45 PM  Radiology No results found.  Procedures Procedures    Medications Ordered in ED Medications  sodium chloride 0.9 % bolus 1,000 mL (has no administration in time range)  pantoprazole (PROTONIX) 80 mg /NS 100 mL IVPB (has no administration in time range)    ED Course/ Medical Decision Making/ A&P                             Medical Decision  Making Joy Martin is a 82 y.o. female here presenting with blood in her stool.  Patient is taking Excedrin and had 1 episode of bloody bowel movement about a week ago and another episode today.  Patient is  lightheaded and dizzy.  Concern for symptomatic anemia.  Likely she has diverticular bleed.  Plan to get CTA to rule out active bleeding.  Will also get CBC and CMP.  Will give a dose of Protonix.  7:12 PM Patient's hemoglobin is down to 8.  Her baseline is around 12.  CTA showed diverticulosis.  Patient does not have any active extravasation.  Patient has incidental splenic artery aneurysm.  Messaged Dr. Levora Angel from GI.  Patient follows with [facility].  Patient will be admitted for bright red blood per rectum.  Problems Addressed: Lower GI bleed: acute illness or injury  Amount and/or Complexity of Data Reviewed Labs: ordered. Radiology: ordered.  Risk Prescription drug management. Decision regarding hospitalization.    Final Clinical Impression(s) / ED Diagnoses Final diagnoses:  None    Rx / DC Orders ED Discharge Orders     None         Charlynne Pander, MD 06/04/23 [phone]

## 2023-06-05 DIAGNOSIS — Z8719 Personal history of other diseases of the digestive system: Secondary | ICD-10-CM

## 2023-06-05 LAB — CBC
HCT: 23.9 % — ABNORMAL LOW (ref 36.0–46.0)
Hemoglobin: 7.5 g/dL — ABNORMAL LOW (ref 12.0–15.0)
MCH: 28.5 pg (ref 26.0–34.0)
MCHC: 31.4 g/dL (ref 30.0–36.0)
MCV: 90.9 fL (ref 80.0–100.0)
Platelets: 343 10*3/uL (ref 150–400)
RBC: 2.63 MIL/uL — ABNORMAL LOW (ref 3.87–5.11)
RDW: 15.1 % (ref 11.5–15.5)
WBC: 8.4 10*3/uL (ref 4.0–10.5)
nRBC: 0 % (ref 0.0–0.2)

## 2023-06-05 LAB — HEMOGLOBIN AND HEMATOCRIT, BLOOD
HCT: 25.6 % — ABNORMAL LOW (ref 36.0–46.0)
Hemoglobin: 7.6 g/dL — ABNORMAL LOW (ref 12.0–15.0)

## 2023-06-05 MED ORDER — HYDROCORTISONE ACETATE 25 MG RE SUPP
25.0000 mg | Freq: Two times a day (BID) | RECTAL | Status: DC
  Administered 2023-06-05 – 2023-06-06 (×3): 25 mg via RECTAL
  Filled 2023-06-05 (×3): qty 1

## 2023-06-05 NOTE — Progress Notes (Signed)
PROGRESS NOTE    Joy Martin  ZOX:[id number] DOB: 01-03-1941 DOA: 06/04/2023 PCP: Pincus Sanes, MD   Brief Narrative:  82 y.o. female with medical history significant of COPD, CKD, GERD, hypertension, hyperlipidemia presented with rectal bleeding and dizziness.  On presentation, hemoglobin was 8.2 (baseline between 12 and 13). CT angiogram abdomen showed splenic artery aneurysm, iliac artery aneurysm, diverticulosis of the colon, hiatal hernia and pulmonary nodules.  GI was consulted.  Assessment & Plan:   Lower GI bleeding, most likely diverticular bleed Acute blood loss anemia -On presentation, hemoglobin was 8.2 (baseline between 12 and 13). CT angiogram abdomen showed splenic artery aneurysm, iliac artery aneurysm, diverticulosis of the colon, hiatal hernia and pulmonary nodules. -Monitor H&H.  Hemoglobin 7.5 today.  No bleeding since admission.  Transfuse if hemoglobin is less than 7 or evidence of active bleeding. -Follow GI recommendations.  Hypertension Hyperlipidemia -Monitor blood pressure.  Continue amlodipine and nadolol -Continue PPI  COPD -stable.  Continue current inhaled regimen  CKD stage IIIa -Creatinine currently stable.  DVT prophylaxis: SCDs Code Status: Full Family Communication: None at bedside Disposition Plan: Status is: Inpatient Remains inpatient appropriate because: Of severity of illness.  Consultants: GI  Procedures: None  Antimicrobials: None   Subjective: Patient seen and examined at bedside.  Denies any further rectal bleeding since admission.  No fever, vomiting, chest pain or shortness of breath reported.  Objective: Vitals:   06/05/23 0206 06/05/23 0501 06/05/23 0758 06/05/23 0943  BP: 112/60 113/86  (!) 133/59  Pulse: 65 74  71  Resp: 18 18  16   Temp: 97.7 F (36.5 C) 97.8 F (36.6 C)  97.6 F (36.4 C)  TempSrc:      SpO2: 95% 97% 98% 99%  Weight:      Height:        Intake/Output Summary (Last 24 hours) at  06/05/2023 1109 Last data filed at 06/05/2023 0954 Gross per 24 hour  Intake 1458 ml  Output --  Net 1458 ml   Filed Weights   06/04/23 1335 06/04/23 2000  Weight: 71.2 kg 71.9 kg    Examination:  General exam: Appears calm and comfortable.  On room air.  Elderly female sitting on chair. Respiratory system: Bilateral decreased breath sounds at bases Cardiovascular system: S1 & S2 heard, Rate controlled Gastrointestinal system: Abdomen is nondistended, soft and nontender. Normal bowel sounds heard. Extremities: No cyanosis, clubbing, edema  Central nervous system: Alert and oriented. No focal neurological deficits. Moving extremities Skin: No rashes, lesions or ulcers Psychiatry: Judgement and insight appear normal. Mood & affect appropriate.     Data Reviewed: I have personally reviewed following labs and imaging studies  CBC: Recent Labs  Lab 06/04/23 1347 06/05/23 0032  WBC 11.1* 8.4  HGB 8.2* 7.5*  HCT 26.5* 23.9*  MCV 94.3 90.9  PLT 352 343   Basic Metabolic Panel: Recent Labs  Lab 06/04/23 1347  NA 139  K 4.2  CL 110  CO2 21*  GLUCOSE 133*  BUN 25*  CREATININE 1.25*  CALCIUM 8.8*   GFR: Estimated Creatinine Clearance: 31.1 mL/min (A) (by C-G formula based on SCr of 1.25 mg/dL (H)). Liver Function Tests: No results for input(s): "AST", "ALT", "ALKPHOS", "BILITOT", "PROT", "ALBUMIN" in the last 168 hours. No results for input(s): "LIPASE", "AMYLASE" in the last 168 hours. No results for input(s): "AMMONIA" in the last 168 hours. Coagulation Profile: No results for input(s): "INR", "PROTIME" in the last 168 hours. Cardiac Enzymes: No results for input(s): "  CKTOTAL", "CKMB", "CKMBINDEX", "TROPONINI" in the last 168 hours. BNP (last 3 results) No results for input(s): "PROBNP" in the last 8760 hours. HbA1C: No results for input(s): "HGBA1C" in the last 72 hours. CBG: Recent Labs  Lab 06/04/23 1520  GLUCAP 123*   Lipid Profile: No results for  input(s): "CHOL", "HDL", "LDLCALC", "TRIG", "CHOLHDL", "LDLDIRECT" in the last 72 hours. Thyroid Function Tests: No results for input(s): "TSH", "T4TOTAL", "FREET4", "T3FREE", "THYROIDAB" in the last 72 hours. Anemia Panel: No results for input(s): "VITAMINB12", "FOLATE", "FERRITIN", "TIBC", "IRON", "RETICCTPCT" in the last 72 hours. Sepsis Labs: No results for input(s): "PROCALCITON", "LATICACIDVEN" in the last 168 hours.  No results found for this or any previous visit (from the past 240 hour(s)).       Radiology Studies: CT Angio Abd/Pel W and/or Wo Contrast  Result Date: 06/04/2023 CLINICAL DATA:  Lower GI bleed dizziness EXAM: CTA ABDOMEN AND PELVIS WITHOUT AND WITH CONTRAST TECHNIQUE: Multidetector CT imaging of the abdomen and pelvis was performed using the standard protocol during bolus administration of intravenous contrast. Multiplanar reconstructed images and MIPs were obtained and reviewed to evaluate the vascular anatomy. RADIATION DOSE REDUCTION: This exam was performed according to the departmental dose-optimization program which includes automated exposure control, adjustment of the mA and/or kV according to patient size and/or use of iterative reconstruction technique. CONTRAST:  80mL OMNIPAQUE IOHEXOL 350 MG/ML SOLN COMPARISON:  CT 07/10/2012 FINDINGS: VASCULAR Aorta: Non contrasted images demonstrate no acute intramural hematoma. Negative for aortic dissection, aneurysm, or significant stenosis. Celiac: Patent without evidence of aneurysm, dissection, vasculitis or significant stenosis. 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 SMA: Patent without evidence of aneurysm, dissection, vasculitis or significant stenosis. Renals: Both renal arteries are patent without evidence of aneurysm, dissection, vasculitis, fibromuscular dysplasia or significant stenosis. IMA: Patent without evidence of aneurysm, dissection, vasculitis or significant stenosis. Inflow: Right  common iliac artery aneurysm measuring 15 mm with mild irregular mural thrombus. No dissection. Patent internal and external iliac vessels. Proximal Outflow: Bilateral common femoral and visualized portions of the superficial and profunda femoral arteries are patent without evidence of aneurysm, dissection, vasculitis or significant stenosis. Veins: No obvious venous abnormality within the limitations of this arterial phase study. Review of the MIP images confirms the above findings. NON-VASCULAR Lower chest: Lung bases demonstrate scattered areas of minimal bronchiectasis and clustered nodularity. Punctate nodules measuring up to 4 mm at the lingula, series 19 image 4. Moderate hiatal hernia. Hepatobiliary: Distended gallbladder without calcified stone. No biliary dilatation. No focal hepatic abnormality. Pancreas: Unremarkable. No pancreatic ductal dilatation or surrounding inflammatory changes. Spleen: Normal in size without focal abnormality. Adrenals/Urinary Tract: Adrenal glands are normal. Small nonobstructing left kidney stone. No hydronephrosis. Cyst upper pole left kidney, no imaging follow-up is recommended. The bladder is normal Stomach/Bowel: Stomach nonenlarged. No dilated small bowel. No acute bowel wall thickening. Diverticular disease of the colon without acute wall thickening. Slightly dense intraluminal contents within the descending colon and splenic flexure, but no evidence for active intraluminal extravasation to suggest active GI bleeding on this exam. Lymphatic: No suspicious lymph nodes Reproductive: Uterus and bilateral adnexa are unremarkable. Other: Negative for pelvic effusion or free air. Musculoskeletal: Grade 1 anterolisthesis L4 on L5 with advanced degenerative changes. IMPRESSION: 1. Negative for acute aortic dissection. No evidence for intraluminal contrast extravasation to suggest active GI bleeding at this time. 2. 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 3. Diverticular disease of the colon without acute wall  thickening. 4. Moderate hiatal hernia. 5. Scattered areas of minimal bronchiectasis and clustered nodularity at the lung bases, possible chronic atypical infection. Punctate nodules measuring up to 4 mm at the lingula. No follow-up needed if patient is low-risk.This recommendation follows the consensus statement: Guidelines for Management of Incidental Pulmonary Nodules Detected on CT Images: From the Fleischner Society 2017; Radiology 2017; 284:228-243. Electronically Signed   By: Jasmine Pang M.D.   On: 06/04/2023 17:33        Scheduled Meds:  amLODipine  5 mg Oral Daily   atorvastatin  20 mg Oral Daily   fluticasone furoate-vilanterol  1 puff Inhalation Daily   And   umeclidinium bromide  1 puff Inhalation Daily   nadolol  40 mg Oral BID   pantoprazole  40 mg Oral Daily   Continuous Infusions:        Glade Lloyd, MD [facility] 06/05/2023, 11:09 AM

## 2023-06-05 NOTE — Consult Note (Signed)
Referring Provider: [facility] Primary Care Physician:  Pincus Sanes, MD Primary Gastroenterologist:  Dr. Matthias Hughs   Reason for Consultation: GI bleed  HPI: Joy Martin is a 82 y.o. female with past medical history of COPD, chronic kidney disease, hypertension, hyperlipidemia and GERD presented to the hospital with bright red blood per rectum a few days duration.  Upon initial evaluation, she was found to have drop in hemoglobin to 8.2 with previous normal hemoglobin of 12.4 in February 2024.  Baseline BMP with mildly elevated creatinine.  CT angio yesterday showed slightly dense intraluminal contents within the descending colon and splenic flexure without any evidence of active intraluminal extravasation to suggest active bleeding.  Also showed diverticulosis and moderate hiatal hernia.  Patient seen and examined at bedside.  Sister at bedside.  Patient had several episodes of large amount of bright red blood per rectum on Monday and has not had any significant bowel movement since then.  Had a small amount of BM yesterday without any blood in the commode but had some blood on the tissue paper.  She has been having intermittent blood on the tissue paper for few months now.  Also having some occasional fecal urgency and incontinence for last 1 year.  Denies any weight loss.  Denies any abdominal pain.  Denies any nausea or vomiting.  Last colonoscopy more than 10 years ago was normal.  No family history of colon cancer.  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   removed right upper lip/perinostril [facility] [place] ([facility])   Chronic sinusitis    follows with ENT for same   CKD (chronic kidney disease) 2018   COPD (chronic obstructive pulmonary disease) (HCC) dx 2013   GOLD 1, follows with pulm for same   Episodic tension type headache    EXOGENOUS OBESITY    GERD    HYPERLIPIDEMIA     Hypertension    Migraines 07/10/2012   "over; last one was @ age 50"   OSTEOPENIA    Pneumonia 2018-2019   x 2    Past Surgical History:  Procedure Laterality Date   APPENDECTOMY  07/10/2012   BREAST CYST ASPIRATION  ?1980's   right   EYE SURGERY     both eyes   KNEE ARTHROSCOPY  1970's or 1980's   right; torn cartilage   LAPAROSCOPIC APPENDECTOMY  07/10/2012   Procedure: APPENDECTOMY LAPAROSCOPIC;  Surgeon: Shelly Rubenstein, MD;  Location: [facility] OR;  Service: General;  Laterality: N/A;   MOHS SURGERY  01/2016   nose   MOLE REMOVAL  1957   "my back"   SKIN CANCER EXCISION     "2 on my back; 2 on my face"   TONSILLECTOMY AND ADENOIDECTOMY  1946   WISDOM TOOTH EXTRACTION  1959    Prior to Admission medications   Medication Sig Start Date End Date Taking? Authorizing Provider  albuterol (VENTOLIN HFA) 108 (90 Base) MCG/ACT inhaler Inhale 2 puffs into the lungs every 6 (six) hours as needed for wheezing or shortness of breath. 03/17/23  Yes Martina Sinner, MD  amLODipine (NORVASC) 5 MG tablet TAKE 1 TABLET BY MOUTH EVERY DAY Patient taking differently: Take 5 mg by mouth in the morning. 03/21/23  Yes Burns, Bobette Mo, MD  aspirin-acetaminophen-caffeine (EXCEDRIN MIGRAINE) [phone] MG tablet Take 2 tablets by mouth every 6 (six) hours  as needed for headache (or neck/head pain).   Yes [provider]  atorvastatin (LIPITOR) 20 MG tablet Take 1 tablet (20 mg total) by mouth daily. Patient taking differently: Take 20 mg by mouth in the morning. 07/01/22  Yes Burns, Bobette Mo, MD  Cranberry 500 MG CAPS Take 1,000 mg by mouth at bedtime.   Yes [provider]  cycloSPORINE (RESTASIS) 0.05 % ophthalmic emulsion Place 1 drop into both eyes in the morning and at bedtime.   Yes [provider]  denosumab (PROLIA) 60 MG/ML SOSY injection Inject 60 mg into the skin every 6 (six) months.   Yes [provider]  Erenumab-aooe (AIMOVIG) 70 MG/ML SOAJ INJECT 70MG  INTO  THE SKIN EVERY 30 DAYS Patient taking differently: Inject 70 mg into the skin every 30 (thirty) days. 05/26/22  Yes Lomax, Amy, NP  esomeprazole (NEXIUM) 40 MG capsule TAKE 1 CAPSULE BY MOUTH DAILY AT 12 NOON. Patient taking differently: Take 40 mg by mouth every other day. 02/24/23  Yes Burns, Bobette Mo, MD  famotidine (PEPCID) 40 MG tablet TAKE 1 TABLET BY MOUTH EVERY DAY Patient taking differently: Take 40 mg by mouth at bedtime. 03/21/23  Yes Burns, Bobette Mo, MD  Flaxseed, Linseed, (FLAXSEED OIL PO) Take 1 capsule by mouth in the morning and at bedtime.   Yes [provider]  Fluticasone-Umeclidin-Vilant (TRELEGY ELLIPTA) 100-62.5-25 MCG/ACT AEPB Inhale 1 puff into the lungs daily. 07/23/22  Yes Martina Sinner, MD  latanoprost (XALATAN) 0.005 % ophthalmic solution Place 1 drop into both eyes at bedtime.   Yes [provider]  Multiple Vitamins-Minerals (PRESERVISION AREDS 2) CAPS Take 1 capsule by mouth in the morning and at bedtime.   Yes [provider]  nadolol (CORGARD) 40 MG tablet TAKE 1 TABLET BY MOUTH TWICE A DAY Patient taking differently: Take 40 mg by mouth in the morning and at bedtime. 05/25/23  Yes Burns, Bobette Mo, MD  valsartan (DIOVAN) 320 MG tablet TAKE 1 TABLET BY MOUTH EVERY DAY Patient taking differently: Take 320 mg by mouth in the morning. 03/21/23  Yes Burns, Bobette Mo, MD  Calcium Carbonate (CALCIUM 500 PO) Take by mouth.    04/06/18  [provider]    Scheduled Meds:  amLODipine  5 mg Oral Daily   atorvastatin  20 mg Oral Daily   fluticasone furoate-vilanterol  1 puff Inhalation Daily   And   umeclidinium bromide  1 puff Inhalation Daily   nadolol  40 mg Oral BID   pantoprazole  40 mg Oral Daily   Continuous Infusions: PRN Meds:.acetaminophen **OR** acetaminophen, ondansetron **OR** ondansetron (ZOFRAN) IV, oxyCODONE  Allergies as of 06/04/2023 - Review Complete 06/04/2023  Allergen Reaction Noted   Allegra [fexofenadine hcl]  Other (See Comments) 07/10/2012   Prilosec [omeprazole] Other (See Comments) 01/25/2018   Ranitidine Other (See Comments) 01/25/2018   Tape Itching and Other (See Comments) 06/04/2023    Family History  Problem Relation Age of Onset   Prostate cancer Father    Cancer Father        prostate   Cancer Mother        waldenstruns microgobular anemia   Hyperparathyroidism Mother    Alcohol abuse Other    Heart disease Other    Hypertension Other    Diabetes Other    Pancreatic cancer Other    Hyperparathyroidism Sister    Cancer Maternal Grandfather        pancreatic    Social History  Socioeconomic History   Marital status: Single    Spouse name: Not on file   Number of children: 0   Years of education: 55   Highest education level: Not on file  Occupational History    Comment: retired Runner, broadcasting/film/video, 5th grade  Tobacco Use   Smoking status: Former    Current packs/day: 0.00    Average packs/day: 0.8 packs/day for 20.0 years (15.0 ttl pk-yrs)    Types: Cigarettes    Start date: 07/25/1962    Quit date: 07/25/1982    Years since quitting: 40.8   Smokeless tobacco: Never   Tobacco comments:    07/10/2012 "stopped smoking cigarettes 1980's"  Substance and Sexual Activity   Alcohol use: Yes    Comment: 07/10/2012 "glass of wine 1-2X/yr"   Drug use: No   Sexual activity: Never  Other Topics Concern   Not on file  Social History Narrative   Retired Runner, broadcasting/film/video, 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   Caffeine- coffee 1 cup, some Coke   Social Determinants of Health   Financial Resource Strain: Low Risk  (03/15/2023)   Overall Financial Resource Strain (CARDIA)    Difficulty of Paying Living Expenses: Not hard at all  Food Insecurity: No Food Insecurity (06/04/2023)   Hunger Vital Sign    Worried About Running Out of Food in the Last Year: Never true    Ran Out of Food in the Last Year: Never true  Transportation Needs: No Transportation Needs (06/04/2023)   PRAPARE - Scientist, research (physical sciences) (Medical): No    Lack of Transportation (Non-Medical): No  Physical Activity: Inactive (03/15/2023)   Exercise Vital Sign    Days of Exercise per Week: 0 days    Minutes of Exercise per Session: 0 min  Stress: No Stress Concern Present (03/15/2023)   [employer] of Occupational Health - Occupational Stress Questionnaire    Feeling of Stress : Not at all  Social Connections: Moderately Integrated (03/15/2023)   Social Connection and Isolation Panel [NHANES]    Frequency of Communication with Friends and Family: Three times a week    Frequency of Social Gatherings with Friends and Family: Three times a week    Attends Religious Services: More than 4 times per year    Active Member of Clubs or Organizations: Yes    Attends Banker Meetings: More than 4 times per year    Marital Status: Never married  Intimate Partner Violence: Not At Risk (06/04/2023)   Humiliation, Afraid, Rape, and Kick questionnaire    Fear of Current or Ex-Partner: No    Emotionally Abused: No    Physically Abused: No    Sexually Abused: No    Review of Systems: All negative except as stated above in HPI.  Physical Exam: Vital signs: Vitals:   06/05/23 0758 06/05/23 0943  BP:  (!) 133/59  Pulse:  71  Resp:  16  Temp:  97.6 F (36.4 C)  SpO2: 98% 99%   Last BM Date : 06/04/23 General:   Alert,  Well-developed, well-nourished, pleasant and cooperative in NAD HEENT -normocephalic, atraumatic, extraocular movement intact Lungs: No visible respiratory distress Heart:  Regular rate and rhythm; no murmurs, clicks, rubs,  or gallops. Abdomen: Soft, nontender, nondistended, bowel sound present, no peritoneal signs Rectal: Rectal exam was performed in presence of nurse technician after asking patient sister to step out.  Rectal exam showed small external hemorrhoids which looked minimally inflamed, no mass. alert and oriented x 3  Mood and affect normal  GI:  Lab Results: Recent Labs     06/04/23 1347 06/05/23 0032  WBC 11.1* 8.4  HGB 8.2* 7.5*  HCT 26.5* 23.9*  PLT 352 343   BMET Recent Labs    06/04/23 1347  NA 139  K 4.2  CL 110  CO2 21*  GLUCOSE 133*  BUN 25*  CREATININE 1.25*  CALCIUM 8.8*   LFT No results for input(s): "PROT", "ALBUMIN", "AST", "ALT", "ALKPHOS", "BILITOT", "BILIDIR", "IBILI" in the last 72 hours. PT/INR No results for input(s): "LABPROT", "INR" in the last 72 hours.   Studies/Results: CT Angio Abd/Pel W and/or Wo Contrast  Result Date: 06/04/2023 CLINICAL DATA:  Lower GI bleed dizziness EXAM: CTA ABDOMEN AND PELVIS WITHOUT AND WITH CONTRAST TECHNIQUE: Multidetector CT imaging of the abdomen and pelvis was performed using the standard protocol during bolus administration of intravenous contrast. Multiplanar reconstructed images and MIPs were obtained and reviewed to evaluate the vascular anatomy. RADIATION DOSE REDUCTION: This exam was performed according to the departmental dose-optimization program which includes automated exposure control, adjustment of the mA and/or kV according to patient size and/or use of iterative reconstruction technique. CONTRAST:  80mL OMNIPAQUE IOHEXOL 350 MG/ML SOLN COMPARISON:  CT 07/10/2012 FINDINGS: VASCULAR Aorta: Non contrasted images demonstrate no acute intramural hematoma. Negative for aortic dissection, aneurysm, or significant stenosis. Celiac: Patent without evidence of aneurysm, dissection, vasculitis or significant stenosis. 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 SMA: Patent without evidence of aneurysm, dissection, vasculitis or significant stenosis. Renals: Both renal arteries are patent without evidence of aneurysm, dissection, vasculitis, fibromuscular dysplasia or significant stenosis. IMA: Patent without evidence of aneurysm, dissection, vasculitis or significant stenosis. 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 No  dissection. Patent internal and external iliac vessels. Proximal Outflow: Bilateral common femoral and visualized portions of the superficial and profunda femoral arteries are patent without evidence of aneurysm, dissection, vasculitis or significant stenosis. Veins: No obvious venous abnormality within the limitations of this arterial phase study. Review of the MIP images confirms the above findings. NON-VASCULAR Lower chest: Lung bases demonstrate scattered areas of minimal bronchiectasis and clustered nodularity. Punctate nodules measuring up to 4 mm at the lingula, series 19 image 4. Moderate hiatal hernia. Hepatobiliary: Distended gallbladder without calcified stone. No biliary dilatation. No focal hepatic abnormality. Pancreas: Unremarkable. No pancreatic ductal dilatation or surrounding inflammatory changes. Spleen: Normal in size without focal abnormality. Adrenals/Urinary Tract: Adrenal glands are normal. Small nonobstructing left kidney stone. No hydronephrosis. Cyst upper pole left kidney, no imaging follow-up is recommended. The bladder is normal Stomach/Bowel: Stomach nonenlarged. No dilated small bowel. No acute bowel wall thickening. Diverticular disease of the colon without acute wall thickening. Slightly dense intraluminal contents within the descending colon and splenic flexure, but no evidence for active intraluminal extravasation to suggest active GI bleeding on this exam. Lymphatic: No suspicious lymph nodes Reproductive: Uterus and bilateral adnexa are unremarkable. Other: Negative for pelvic effusion or free air. Musculoskeletal: Grade 1 anterolisthesis L4 on L5 with advanced degenerative changes. IMPRESSION: 1. Negative for acute aortic dissection. No evidence for intraluminal contrast extravasation to suggest active GI bleeding at this time. 2. 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 3. Diverticular disease of the colon without acute wall thickening. 4.  Moderate hiatal hernia. 5. Scattered areas of minimal bronchiectasis and clustered nodularity at the lung bases, possible chronic atypical infection. Punctate nodules measuring up to 4 mm at  the lingula. No follow-up needed if patient is low-risk.This recommendation follows the consensus statement: Guidelines for Management of Incidental Pulmonary Nodules Detected on CT Images: From the Fleischner Society 2017; Radiology 2017; 284:228-243. Electronically Signed   By: Jasmine Pang M.D.   On: 06/04/2023 17:33    Impression/Plan: -Rectal bleeding.  Improving now.  Differential diagnosis would be diverticular bleed versus hemorrhoidal bleed.  Rectal exam today showed small external hemorrhoids which look mildly inflamed.  CT angio did showed some dense intraluminal contents within the descending colon slightly fracture but no active luminal extravasation.  -Acute blood loss anemia.  Recommendations ----------------------- -Recommend conservative management for now.  Okay to have soft diet. -Start Anusol suppositories for hemorrhoids -Avoid constipation -Repeat labs in the morning.  GI will follow.    LOS: 1 day   Kathi Der  MD, FACP 06/05/2023, 11:01 AM  Contact #  [phone]

## 2023-06-05 NOTE — Plan of Care (Signed)

## 2023-06-06 DIAGNOSIS — Z8719 Personal history of other diseases of the digestive system: Secondary | ICD-10-CM | POA: Diagnosis not present

## 2023-06-06 MED ORDER — PSYLLIUM 95 % PO PACK
1.0000 | PACK | Freq: Every day | ORAL | Status: DC
  Filled 2023-06-06: qty 1

## 2023-06-06 MED ORDER — PSYLLIUM 95 % PO PACK: 1.0000 | PACK | Freq: Every day | ORAL | 0 refills | Status: DC

## 2023-06-06 MED ORDER — FAMOTIDINE 40 MG PO TABS: 40.0000 mg | ORAL_TABLET | Freq: Every day | ORAL | Status: DC

## 2023-06-06 NOTE — Progress Notes (Addendum)
Patient provided with discharge education, patient verbalized understanding. IV removed.  

## 2023-06-06 NOTE — Discharge Summary (Signed)
Physician Discharge Summary  Joy Martin YQM:[id number] DOB: 1941/10/26 DOA: 06/04/2023  PCP: Pincus Sanes, MD  Admit date: 06/04/2023 Discharge date: 06/06/2023  Admitted From: Home Disposition: Home  Recommendations for Outpatient Follow-up:  Follow up with PCP in 1 week with repeat CBC/BMP Outpatient follow-up with GI Follow up in ED if symptoms worsen or new appear   Home Health: No Equipment/Devices: None  Discharge Condition: Stable CODE STATUS: Full Diet recommendation: Heart healthy  Brief/Interim Summary: 82 y.o. female with medical history significant of COPD, CKD, GERD, hypertension, hyperlipidemia presented with rectal bleeding and dizziness.  On presentation, hemoglobin was 8.2 (baseline between 12 and 13). CT angiogram abdomen showed splenic artery aneurysm, iliac artery aneurysm, diverticulosis of the colon, hiatal hernia and pulmonary nodules.  GI was consulted.  Patient was managed conservatively.  During the hospitalization, her condition has improved.  No further bleeding; hemoglobin is low but has remained stable.  GI has cleared the patient for discharge.  She will be discharged home today with outpatient follow-up with PCP and GI.  Discharge Diagnoses:   Lower GI bleeding, most likely diverticular bleed Acute blood loss anemia -On presentation, hemoglobin was 8.2 (baseline between 12 and 13). CT angiogram abdomen showed splenic artery aneurysm, iliac artery aneurysm, diverticulosis of the colon, hiatal hernia and pulmonary nodules. -GI was consulted.  Patient was managed conservatively.  During the hospitalization, her condition has improved.  No further bleeding; hemoglobin is low but has remained stable and is 7.8 today.  GI has cleared the patient for discharge.  She will be discharged home today with outpatient follow-up with PCP and GI.  GI recommends daily Metamucil.   Hypertension Hyperlipidemia -Monitor blood pressure.  Continue amlodipine and  nadolol.  Keep losartan on hold till reevaluation with PCP. -Continue statin  COPD -stable.  Continue current inhaled regimen.  Outpatient follow-up   CKD stage IIIa -Creatinine currently stable.  Hyperkalemia -Mild.  Valsartan on hold.  Outpatient follow-up.  Discharge Instructions  Discharge Instructions     Diet - low sodium heart healthy   Complete by: As directed    Increase activity slowly   Complete by: As directed       Allergies as of 06/06/2023       Reactions   Allegra [fexofenadine Hcl] Other (See Comments)   Headache, "took the pill; I got flu symptoms"   Prilosec [omeprazole] Other (See Comments)   "Fecal incontinence"   Ranitidine Other (See Comments)   "Fecal incontinence"   Tape Itching, Other (See Comments)   Band-Aids = Skin becomes red and itchy        Medication List     STOP taking these medications    aspirin-acetaminophen-caffeine 250-250-65 MG tablet Commonly known as: EXCEDRIN MIGRAINE   valsartan 320 MG tablet Commonly known as: DIOVAN       TAKE these medications    Aimovig 70 MG/ML Soaj Generic drug: Erenumab-aooe INJECT 70MG  INTO THE SKIN EVERY 30 DAYS What changed:  how much to take how to take this when to take this additional instructions   albuterol 108 (90 Base) MCG/ACT inhaler Commonly known as: VENTOLIN HFA Inhale 2 puffs into the lungs every 6 (six) hours as needed for wheezing or shortness of breath.   amLODipine 5 MG tablet Commonly known as: NORVASC TAKE 1 TABLET BY MOUTH EVERY DAY What changed: when to take this   atorvastatin 20 MG tablet Commonly known as: LIPITOR Take 1 tablet (20 mg total) by mouth daily. What  changed: when to take this   Cranberry 500 MG Caps Take 1,000 mg by mouth at bedtime.   cycloSPORINE 0.05 % ophthalmic emulsion Commonly known as: RESTASIS Place 1 drop into both eyes in the morning and at bedtime.   denosumab 60 MG/ML Sosy injection Commonly known as:  PROLIA Inject 60 mg into the skin every 6 (six) months.   esomeprazole 40 MG capsule Commonly known as: NEXIUM TAKE 1 CAPSULE BY MOUTH DAILY AT 12 NOON. What changed: See the new instructions.   famotidine 40 MG tablet Commonly known as: PEPCID Take 1 tablet (40 mg total) by mouth at bedtime.   FLAXSEED OIL PO Take 1 capsule by mouth in the morning and at bedtime.   latanoprost 0.005 % ophthalmic solution Commonly known as: XALATAN Place 1 drop into both eyes at bedtime.   nadolol 40 MG tablet Commonly known as: CORGARD TAKE 1 TABLET BY MOUTH TWICE A DAY What changed: when to take this   PreserVision AREDS 2 Caps Take 1 capsule by mouth in the morning and at bedtime.   psyllium 95 % Pack Commonly known as: HYDROCIL/METAMUCIL Take 1 packet by mouth daily.   Trelegy Ellipta 100-62.5-25 MCG/ACT Aepb Generic drug: Fluticasone-Umeclidin-Vilant Inhale 1 puff into the lungs daily.        Follow-up Information     Pincus Sanes, MD. Schedule an appointment as soon as possible for a visit in 1 week(s).   Specialty: Internal Medicine Why: With repeat BMP Contact information: [address] [place] [address] [phone]                Allergies  Allergen Reactions   Allegra [Fexofenadine Hcl] Other (See Comments)    Headache, "took the pill; I got flu symptoms"   Prilosec [Omeprazole] Other (See Comments)    "Fecal incontinence"   Ranitidine Other (See Comments)    "Fecal incontinence"   Tape Itching and Other (See Comments)    Band-Aids = Skin becomes red and itchy    Consultations: GI   Procedures/Studies: CT Angio Abd/Pel W and/or Wo Contrast  Result Date: 06/04/2023 CLINICAL DATA:  Lower GI bleed dizziness EXAM: CTA ABDOMEN AND PELVIS WITHOUT AND WITH CONTRAST TECHNIQUE: Multidetector CT imaging of the abdomen and pelvis was performed using the standard protocol during bolus administration of intravenous contrast. Multiplanar reconstructed  images and MIPs were obtained and reviewed to evaluate the vascular anatomy. RADIATION DOSE REDUCTION: This exam was performed according to the departmental dose-optimization program which includes automated exposure control, adjustment of the mA and/or kV according to patient size and/or use of iterative reconstruction technique. CONTRAST:  80mL OMNIPAQUE IOHEXOL 350 MG/ML SOLN COMPARISON:  CT 07/10/2012 FINDINGS: VASCULAR Aorta: Non contrasted images demonstrate no acute intramural hematoma. Negative for aortic dissection, aneurysm, or significant stenosis. Celiac: Patent without evidence of aneurysm, dissection, vasculitis or significant stenosis. 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 SMA: Patent without evidence of aneurysm, dissection, vasculitis or significant stenosis. Renals: Both renal arteries are patent without evidence of aneurysm, dissection, vasculitis, fibromuscular dysplasia or significant stenosis. IMA: Patent without evidence of aneurysm, dissection, vasculitis or significant stenosis. 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 No dissection. Patent internal and external iliac vessels. Proximal Outflow: Bilateral common femoral and visualized portions of the superficial and profunda femoral arteries are patent without evidence of aneurysm, dissection, vasculitis or significant stenosis. Veins: No obvious venous abnormality within the limitations of this arterial phase study.  Review of the MIP images confirms the above findings. NON-VASCULAR Lower chest: Lung bases demonstrate scattered areas of minimal bronchiectasis and clustered nodularity. Punctate nodules measuring up to 4 mm at the lingula, series 19 image 4. Moderate hiatal hernia. Hepatobiliary: Distended gallbladder without calcified stone. No biliary dilatation. No focal hepatic abnormality. Pancreas: Unremarkable. No pancreatic ductal dilatation or surrounding  inflammatory changes. Spleen: Normal in size without focal abnormality. Adrenals/Urinary Tract: Adrenal glands are normal. Small nonobstructing left kidney stone. No hydronephrosis. Cyst upper pole left kidney, no imaging follow-up is recommended. The bladder is normal Stomach/Bowel: Stomach nonenlarged. No dilated small bowel. No acute bowel wall thickening. Diverticular disease of the colon without acute wall thickening. Slightly dense intraluminal contents within the descending colon and splenic flexure, but no evidence for active intraluminal extravasation to suggest active GI bleeding on this exam. Lymphatic: No suspicious lymph nodes Reproductive: Uterus and bilateral adnexa are unremarkable. Other: Negative for pelvic effusion or free air. Musculoskeletal: Grade 1 anterolisthesis L4 on L5 with advanced degenerative changes. IMPRESSION: 1. Negative for acute aortic dissection. No evidence for intraluminal contrast extravasation to suggest active GI bleeding at this time. 2. 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 3. Diverticular disease of the colon without acute wall thickening. 4. Moderate hiatal hernia. 5. Scattered areas of minimal bronchiectasis and clustered nodularity at the lung bases, possible chronic atypical infection. Punctate nodules measuring up to 4 mm at the lingula. No follow-up needed if patient is low-risk.This recommendation follows the consensus statement: Guidelines for Management of Incidental Pulmonary Nodules Detected on CT Images: From the Fleischner Society 2017; Radiology 2017; 284:228-243. Electronically Signed   By: Jasmine Pang M.D.   On: 06/04/2023 17:33      Subjective: Patient seen and examined at bedside.  Denies any more rectal bleeding.  Tolerating diet.  Feels okay to go home today.  No fever or vomiting reported.  Discharge Exam: Vitals:   06/05/23 2049 06/06/23 0415  BP: (!) 120/58 (!) 120/58  Pulse: 64 67  Resp: 18 18   Temp: 97.6 F (36.4 C) 98.1 F (36.7 C)  SpO2: 95% 96%    General: Pt is alert, awake, not in acute distress.  Elderly female lying in bed.  On room air. Cardiovascular: rate controlled, S1/S2 + Respiratory: bilateral decreased breath sounds at bases Abdominal: Soft, NT, ND, bowel sounds + Extremities: no edema, no cyanosis    The results of significant diagnostics from this hospitalization (including imaging, microbiology, ancillary and laboratory) are listed below for reference.     Microbiology: No results found for this or any previous visit (from the past 240 hour(s)).   Labs: BNP (last 3 results) No results for input(s): "BNP" in the last 8760 hours. Basic Metabolic Panel: Recent Labs  Lab 06/04/23 1347 06/06/23 0532  NA 139 139  K 4.2 5.3*  CL 110 110  CO2 21* 22  GLUCOSE 133* 108*  BUN 25* 17  CREATININE 1.25* 1.01*  CALCIUM 8.8* 8.9  MG  --  2.1   Liver Function Tests: No results for input(s): "AST", "ALT", "ALKPHOS", "BILITOT", "PROT", "ALBUMIN" in the last 168 hours. No results for input(s): "LIPASE", "AMYLASE" in the last 168 hours. No results for input(s): "AMMONIA" in the last 168 hours. CBC: Recent Labs  Lab 06/04/23 1347 06/05/23 0032 06/05/23 1238 06/06/23 0532  WBC 11.1* 8.4  --  7.8  NEUTROABS  --   --   --  5.6  HGB 8.2* 7.5*  7.6* 7.8*  HCT 26.5* 23.9* 25.6* 27.1*  MCV 94.3 90.9  --  99.6  PLT 352 343  --  328   Cardiac Enzymes: No results for input(s): "CKTOTAL", "CKMB", "CKMBINDEX", "TROPONINI" in the last 168 hours. BNP: Invalid input(s): "POCBNP" CBG: Recent Labs  Lab 06/04/23 1520  GLUCAP 123*   D-Dimer No results for input(s): "DDIMER" in the last 72 hours. Hgb A1c No results for input(s): "HGBA1C" in the last 72 hours. Lipid Profile No results for input(s): "CHOL", "HDL", "LDLCALC", "TRIG", "CHOLHDL", "LDLDIRECT" in the last 72 hours. Thyroid function studies No results for input(s): "TSH", "T4TOTAL", "T3FREE",  "THYROIDAB" in the last 72 hours.  Invalid input(s): "FREET3" Anemia work up No results for input(s): "VITAMINB12", "FOLATE", "FERRITIN", "TIBC", "IRON", "RETICCTPCT" in the last 72 hours. Urinalysis    Component Value Date/Time   COLORURINE STRAW (A) 06/04/2023 1500   APPEARANCEUR CLEAR 06/04/2023 1500   LABSPEC 1.009 06/04/2023 1500   PHURINE 6.0 06/04/2023 1500   GLUCOSEU NEGATIVE 06/04/2023 1500   GLUCOSEU NEGATIVE 10/25/2012 1153   HGBUR NEGATIVE 06/04/2023 1500   HGBUR negative 12/25/2009 0947   BILIRUBINUR NEGATIVE 06/04/2023 1500   BILIRUBINUR Negative 04/10/2020 1132   KETONESUR NEGATIVE 06/04/2023 1500   PROTEINUR NEGATIVE 06/04/2023 1500   UROBILINOGEN 0.2 04/10/2020 1132   UROBILINOGEN 0.2 10/25/2012 1153   NITRITE NEGATIVE 06/04/2023 1500   LEUKOCYTESUR TRACE (A) 06/04/2023 1500   Sepsis Labs Recent Labs  Lab 06/04/23 1347 06/05/23 0032 06/06/23 0532  WBC 11.1* 8.4 7.8   Microbiology No results found for this or any previous visit (from the past 240 hour(s)).   Time coordinating discharge: 35 minutes  SIGNED:   Glade Lloyd, MD  [facility] 06/06/2023, 10:36 AM

## 2023-06-06 NOTE — Progress Notes (Signed)
Mobility Specialist - Progress Note   06/06/23 0956  Mobility  Activity Ambulated with assistance in hallway  Level of Assistance Modified independent, requires aide device or extra time  Assistive Device Other (Comment) (HHA)  Distance Ambulated (ft) 80 ft  Activity Response Tolerated well  Mobility Referral Yes  $Mobility charge 1 Mobility  Mobility Specialist Start Time (ACUTE ONLY) [id number]  Mobility Specialist Stop Time (ACUTE ONLY) 0955  Mobility Specialist Time Calculation (min) (ACUTE ONLY) 7 min   Pt received in bed and agreeable to mobility. Pt held on to wall railing due to feeling dizzy from pain meds. No complaints during session. Pt to recliner after session with all needs met.  [facility]

## 2023-06-06 NOTE — Plan of Care (Signed)

## 2023-06-06 NOTE — Progress Notes (Signed)
   06/06/23 1044  TOC Brief Assessment  Insurance and Status Reviewed  Patient has primary care physician Yes  Home environment has been reviewed Home  Prior level of function: Independent  Prior/Current Home Services No current home services  Social Determinants of Health Reivew SDOH reviewed no interventions necessary  Readmission risk has been reviewed Yes  Transition of care needs no transition of care needs at this time

## 2023-06-06 NOTE — Progress Notes (Signed)
Subjective: Patient has not had rectal bleeding for several days.  She is able to tolerate soft diet.  She denies abdominal pain.  Objective: Vital signs in last 24 hours: Temp:  [97.6 F (36.4 C)-98.1 F (36.7 C)] 98.1 F (36.7 C) (07/29 0415) Pulse Rate:  [64-67] 67 (07/29 0415) Resp:  [18] 18 (07/29 0415) BP: (120)/(58) 120/58 (07/29 0415) SpO2:  [95 %-96 %] 96 % (07/29 0415) Weight change:  Last BM Date : 06/04/23  PE: Elderly, not in distress GENERAL: Prominent pallor  ABDOMEN: Nondistended, nontender EXTREMITIES: No deformity   Lab Results: Results for orders placed or performed during the hospital encounter of 06/04/23 (from the past 48 hour(s))  Basic metabolic panel     Status: Abnormal   Collection Time: 06/04/23  1:47 PM  Result Value Ref Range   Sodium 139 135 - 145 mmol/L   Potassium 4.2 3.5 - 5.1 mmol/L   Chloride 110 98 - 111 mmol/L   CO2 21 (L) 22 - 32 mmol/L   Glucose, Bld 133 (H) 70 - 99 mg/dL    Comment: Glucose reference range applies only to samples taken after fasting for at least 8 hours.   BUN 25 (H) 8 - 23 mg/dL   Creatinine, Ser 9.14 (H) 0.44 - 1.00 mg/dL   Calcium 8.8 (L) 8.9 - 10.3 mg/dL   GFR, Estimated 43 (L) >60 mL/min    Comment: (NOTE) Calculated using the CKD-EPI Creatinine Equation (2021)    Anion gap 8 5 - 15    Comment: Performed at [facility], [address] [place], [place] [address]  CBC     Status: Abnormal   Collection Time: 06/04/23  1:47 PM  Result Value Ref Range   WBC 11.1 (H) 4.0 - 10.5 K/uL   RBC 2.81 (L) 3.87 - 5.11 MIL/uL   Hemoglobin 8.2 (L) 12.0 - 15.0 g/dL   HCT 62.1 (L) 30.8 - 65.7 %   MCV 94.3 80.0 - 100.0 fL   MCH 29.2 26.0 - 34.0 pg   MCHC 30.9 30.0 - 36.0 g/dL   RDW 84.6 96.2 - 95.2 %   Platelets 352 150 - 400 K/uL   nRBC 0.0 0.0 - 0.2 %    Comment: Performed at [facility], [address], [place], [place] [address]  ABO/Rh     Status: None   Collection Time:  06/04/23  1:53 PM  Result Value Ref Range   ABO/RH(D)      O POS Performed at [facility], [address], [place], [place] [address]   Urinalysis, Routine w reflex microscopic -     Status: Abnormal   Collection Time: 06/04/23  3:00 PM  Result Value Ref Range   Color, Urine STRAW (A) YELLOW   APPearance CLEAR CLEAR   Specific Gravity, Urine 1.009 1.005 - 1.030   pH 6.0 5.0 - 8.0   Glucose, UA NEGATIVE NEGATIVE mg/dL   Hgb urine dipstick NEGATIVE NEGATIVE   Bilirubin Urine NEGATIVE NEGATIVE   Ketones, ur NEGATIVE NEGATIVE mg/dL   Protein, ur NEGATIVE NEGATIVE mg/dL   Nitrite NEGATIVE NEGATIVE   Leukocytes,Ua TRACE (A) NEGATIVE   RBC / HPF 0-5 0 - 5 RBC/hpf   WBC, UA 6-10 0 - 5 WBC/hpf   Bacteria, UA NONE SEEN NONE SEEN   Squamous Epithelial / HPF 0-5 0 - 5 /HPF    Comment: Performed at [facility], [address], [place], [place] [address]  CBG monitoring, ED     Status: Abnormal   Collection Time: 06/04/23  3:20 PM  Result Value Ref Range   Glucose-Capillary 123 (H) 70 - 99 mg/dL    Comment: Glucose reference range applies only to samples taken after fasting for at least 8 hours.  POC occult blood, ED     Status: Abnormal   Collection Time: 06/04/23  3:28 PM  Result Value Ref Range   Fecal Occult Bld POSITIVE (A) NEGATIVE  Type and screen     Status: None   Collection Time: 06/04/23  3:50 PM  Result Value Ref Range   ABO/RH(D) O POS    Antibody Screen NEG    Sample Expiration      06/07/2023,2359 Performed at [facility], [address], [place], [place] [address]   CBC     Status: Abnormal   Collection Time: 06/05/23 12:32 AM  Result Value Ref Range   WBC 8.4 4.0 - 10.5 K/uL   RBC 2.63 (L) 3.87 - 5.11 MIL/uL   Hemoglobin 7.5 (L) 12.0 - 15.0 g/dL   HCT 60.4 (L) 54.0 - 98.1 %   MCV 90.9 80.0 - 100.0 fL   MCH 28.5 26.0 - 34.0 pg   MCHC 31.4 30.0 - 36.0 g/dL   RDW 19.1 47.8 - 29.5 %   Platelets 343 150 -  400 K/uL   nRBC 0.0 0.0 - 0.2 %    Comment: Performed at [facility], [address] [place], [place] [address]  Hemoglobin and hematocrit, blood     Status: Abnormal   Collection Time: 06/05/23 12:38 PM  Result Value Ref Range   Hemoglobin 7.6 (L) 12.0 - 15.0 g/dL   HCT 86.5 (L) 78.4 - 69.6 %    Comment: Performed at [facility], [address], [place], [place] [address]  Basic metabolic panel     Status: Abnormal   Collection Time: 06/06/23  5:32 AM  Result Value Ref Range   Sodium 139 135 - 145 mmol/L   Potassium 5.3 (H) 3.5 - 5.1 mmol/L   Chloride 110 98 - 111 mmol/L   CO2 22 22 - 32 mmol/L   Glucose, Bld 108 (H) 70 - 99 mg/dL    Comment: Glucose reference range applies only to samples taken after fasting for at least 8 hours.   BUN 17 8 - 23 mg/dL   Creatinine, Ser 4.13 (H) 0.44 - 1.00 mg/dL   Calcium 8.9 8.9 - 24.4 mg/dL   GFR, Estimated 55 (L) >60 mL/min    Comment: (NOTE) Calculated using the CKD-EPI Creatinine Equation (2021)    Anion gap 7 5 - 15    Comment: Performed at [facility], [address] [place], [place] [address]  Magnesium     Status: None   Collection Time: 06/06/23  5:32 AM  Result Value Ref Range   Magnesium 2.1 1.7 - 2.4 mg/dL    Comment: Performed at [facility], [address], [place], [place] [address]  CBC with Differential/Platelet     Status: Abnormal   Collection Time: 06/06/23  5:32 AM  Result Value Ref Range   WBC 7.8 4.0 - 10.5 K/uL   RBC 2.72 (L) 3.87 - 5.11 MIL/uL   Hemoglobin 7.8 (L) 12.0 - 15.0 g/dL   HCT 44.0 (L) 34.7 - 42.5 %   MCV 99.6 80.0 - 100.0 fL    Comment: DELTA CHECK NOTED   MCH 28.7  26.0 - 34.0 pg   MCHC 28.8 (L) 30.0 - 36.0 g/dL   RDW 01.0 27.2 - 53.6 %   Platelets 328 150 - 400 K/uL   nRBC 0.0 0.0 - 0.2 %   Neutrophils Relative % 72 %   Neutro Abs 5.6 1.7 - 7.7 K/uL   Lymphocytes Relative 15 %   Lymphs Abs 1.2 0.7 - 4.0 K/uL    Monocytes Relative 10 %   Monocytes Absolute 0.8 0.1 - 1.0 K/uL   Eosinophils Relative 2 %   Eosinophils Absolute 0.2 0.0 - 0.5 K/uL   Basophils Relative 1 %   Basophils Absolute 0.0 0.0 - 0.1 K/uL   Immature Granulocytes 0 %   Abs Immature Granulocytes 0.03 0.00 - 0.07 K/uL    Comment: Performed at [facility], [address], [place], [place] [address]    Studies/Results: CT Angio Abd/Pel W and/or Wo Contrast  Result Date: 06/04/2023 CLINICAL DATA:  Lower GI bleed dizziness EXAM: CTA ABDOMEN AND PELVIS WITHOUT AND WITH CONTRAST TECHNIQUE: Multidetector CT imaging of the abdomen and pelvis was performed using the standard protocol during bolus administration of intravenous contrast. Multiplanar reconstructed images and MIPs were obtained and reviewed to evaluate the vascular anatomy. RADIATION DOSE REDUCTION: This exam was performed according to the departmental dose-optimization program which includes automated exposure control, adjustment of the mA and/or kV according to patient size and/or use of iterative reconstruction technique. CONTRAST:  80mL OMNIPAQUE IOHEXOL 350 MG/ML SOLN COMPARISON:  CT 07/10/2012 FINDINGS: VASCULAR Aorta: Non contrasted images demonstrate no acute intramural hematoma. Negative for aortic dissection, aneurysm, or significant stenosis. Celiac: Patent without evidence of aneurysm, dissection, vasculitis or significant stenosis. 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 SMA: Patent without evidence of aneurysm, dissection, vasculitis or significant stenosis. Renals: Both renal arteries are patent without evidence of aneurysm, dissection, vasculitis, fibromuscular dysplasia or significant stenosis. IMA: Patent without evidence of aneurysm, dissection, vasculitis or significant stenosis. 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 No dissection. Patent internal and external iliac  vessels. Proximal Outflow: Bilateral common femoral and visualized portions of the superficial and profunda femoral arteries are patent without evidence of aneurysm, dissection, vasculitis or significant stenosis. Veins: No obvious venous abnormality within the limitations of this arterial phase study. Review of the MIP images confirms the above findings. NON-VASCULAR Lower chest: Lung bases demonstrate scattered areas of minimal bronchiectasis and clustered nodularity. Punctate nodules measuring up to 4 mm at the lingula, series 19 image 4. Moderate hiatal hernia. Hepatobiliary: Distended gallbladder without calcified stone. No biliary dilatation. No focal hepatic abnormality. Pancreas: Unremarkable. No pancreatic ductal dilatation or surrounding inflammatory changes. Spleen: Normal in size without focal abnormality. Adrenals/Urinary Tract: Adrenal glands are normal. Small nonobstructing left kidney stone. No hydronephrosis. Cyst upper pole left kidney, no imaging follow-up is recommended. The bladder is normal Stomach/Bowel: Stomach nonenlarged. No dilated small bowel. No acute bowel wall thickening. Diverticular disease of the colon without acute wall thickening. Slightly dense intraluminal contents within the descending colon and splenic flexure, but no evidence for active intraluminal extravasation to suggest active GI bleeding on this exam. Lymphatic: No suspicious lymph nodes Reproductive: Uterus and bilateral adnexa are unremarkable. Other: Negative for pelvic effusion or free air. Musculoskeletal: Grade 1 anterolisthesis L4 on L5 with advanced degenerative changes. IMPRESSION: 1. Negative for acute aortic dissection. No evidence for intraluminal contrast extravasation to suggest active GI bleeding at this time. 2. 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 3. Diverticular disease of the colon without acute wall thickening. 4. Moderate hiatal hernia. 5. Scattered areas of  minimal bronchiectasis and clustered nodularity at the lung bases, possible chronic atypical infection. Punctate nodules measuring up to 4 mm at the lingula. No follow-up needed if patient is low-risk.This recommendation follows the consensus statement: Guidelines for Management of Incidental Pulmonary Nodules Detected on CT Images: From the Fleischner Society 2017; Radiology 2017; 284:228-243. Electronically Signed   By: Jasmine Pang M.D.   On: 06/04/2023 17:33    Medications: I have reviewed the patient's current medications.  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 Advised patient to take supplements such as Metamucil or Benefiber on a regular basis. Okay to DC home from GI standpoint.  Kerin Salen, MD 06/06/2023, 10:11 AM

## 2023-06-07 ENCOUNTER — Encounter: Payer: Self-pay | Admitting: *Deleted

## 2023-06-07 ENCOUNTER — Telehealth: Payer: Self-pay | Admitting: *Deleted

## 2023-06-07 NOTE — Transitions of Care (Post Inpatient/ED Visit) (Signed)
06/07/2023  Name: Joy Martin MRN: [id number] DOB: Nov 07, 1941  Today's TOC FU Call Status: Today's TOC FU Call Status:: Successful TOC FU Call Competed TOC FU Call Complete Date: 06/07/23  Transition Care Management Follow-up Telephone Call Date of Discharge: 06/06/23 Discharge Facility: [facility]) Type of Discharge: Inpatient Admission Primary Inpatient Discharge Diagnosis:: acute GI bleeding with blood loss anemia How have you been since you were released from the hospital?: Better ("I am slowly but surely feeling better; very glad to be at home. My sister is staying with me for now until I recuperate.  I will call the radiology department to see if I should or should not have the injection planned for this Friday") Any questions or concerns?: Yes Patient Questions/Concerns:: "I have an injection scheduled for my parathyroid on Friday 8/2.... I don't know if I should go or not after having had this hospital visit and being so weak and losing blood" Patient Questions/Concerns Addressed: Other: (pointed out department phone number on AVS and advised patient to call after TOC call to ask her specific questions)  Items Reviewed: Did you receive and understand the discharge instructions provided?: Yes (thoroughly reviewed with patient who verbalizes good understanding of same) Medications obtained,verified, and reconciled?: Yes (Medications Reviewed) (Full medication reconciliation/ review completed; no concerns or discrepancies identified; confirmed patient obtained/ is taking all newly Rx'd medications as instructed; self-manages medications and denies questions/ concerns around medications today) Any new allergies since your discharge?: No Dietary orders reviewed?: Yes Type of Diet Ordered:: "Conservative, slowly progressing" Do you have support at home?: Yes People in Home: alone Name of Support/Comfort Primary Source: Reports independent in self-care activities; resides alone  normally; supportive sister and brother-in-law currently residing with patient temporarily and assists as/ if needed/ indicated  Medications Reviewed Today: Medications Reviewed Today     Reviewed by Michaela Corner, RN (Registered Nurse) on 06/07/23 at 1424  Med List Status: <None>   Medication Order Taking? Sig Documenting Provider Last Dose Status Informant  albuterol (VENTOLIN HFA) 108 (90 Base) MCG/ACT inhaler [id number] Yes Inhale 2 puffs into the lungs every 6 (six) hours as needed for wheezing or shortness of breath. Martina Sinner, MD Taking Active Self  amLODipine (NORVASC) 5 MG tablet [id number] Yes TAKE 1 TABLET BY MOUTH EVERY DAY  Patient taking differently: Take 5 mg by mouth in the morning.   Pincus Sanes, MD Taking Active Self  atorvastatin (LIPITOR) 20 MG tablet [id number] Yes Take 1 tablet (20 mg total) by mouth daily.  Patient taking differently: Take 20 mg by mouth in the morning.   Pincus Sanes, MD Taking Active Self    Discontinued 04/06/18 1558 (Discontinued by provider) Cranberry 500 MG CAPS [id number] Yes Take 1,000 mg by mouth at bedtime. [provider] Taking Active Self  cycloSPORINE (RESTASIS) 0.05 % ophthalmic emulsion [id number] Yes Place 1 drop into both eyes in the morning and at bedtime. [provider] Taking Active Self  denosumab (PROLIA) 60 MG/ML SOSY injection [id number] Yes Inject 60 mg into the skin every 6 (six) months. [provider] Taking Active Self  Erenumab-aooe (AIMOVIG) 70 MG/ML Ivory Broad [id number] Yes INJECT 70MG  INTO THE SKIN EVERY 30 DAYS  Patient taking differently: Inject 70 mg into the skin every 30 (thirty) days.   Lomax, Amy, NP Taking Active Self  esomeprazole (NEXIUM) 40 MG capsule [id number] Yes TAKE 1 CAPSULE BY MOUTH DAILY AT 12 NOON.  Patient taking differently: Take 40 mg by mouth every  other day.   Pincus Sanes, MD Taking Active Self           Med Note Michaela Corner   Tue Jun 07, 2023  2:04 PM)  06/07/23: Reports during [facility] call was told by Dr. Lawerance Bach to take every-other-day-- that is how she is currently taking  famotidine (PEPCID) 40 MG tablet [id number] Yes Take 1 tablet (40 mg total) by mouth at bedtime. Glade Lloyd, MD Taking Active   Flaxseed, Linseed, (FLAXSEED OIL PO) [id number] Yes Take 1 capsule by mouth in the morning and at bedtime. [provider] Taking Active Self  Fluticasone-Umeclidin-Vilant (TRELEGY ELLIPTA) 100-62.5-25 MCG/ACT AEPB [id number] Yes Inhale 1 puff into the lungs daily. Martina Sinner, MD Taking Active Self  latanoprost (XALATAN) 0.005 % ophthalmic solution [id number] Yes Place 1 drop into both eyes at bedtime. [provider] Taking Active Self  Multiple Vitamins-Minerals (PRESERVISION AREDS 2) CAPS [id number] Yes Take 1 capsule by mouth in the morning and at bedtime. [provider] Taking Active Self  nadolol (CORGARD) 40 MG tablet [id number] Yes TAKE 1 TABLET BY MOUTH TWICE A DAY  Patient taking differently: Take 40 mg by mouth in the morning and at bedtime.   Pincus Sanes, MD Taking Active Self  psyllium (HYDROCIL/METAMUCIL) 95 % PACK [id number] Yes Take 1 packet by mouth daily. Glade Lloyd, MD Taking Active            Med Note Michaela Corner   Tue Jun 07, 2023  2:01 PM) 06/07/23: Reports during TOC using benefiber instead-- was told by hospital doctor that is "okay to do"            Home Care and Equipment/Supplies: Were Home Health Services Ordered?: No Any new equipment or medical supplies ordered?: No  Functional Questionnaire: Do you need assistance with bathing/showering or dressing?: No Do you need assistance with meal preparation?: No Do you need assistance with eating?: No Do you have difficulty maintaining continence: No Do you need assistance with getting out of bed/getting out of a chair/moving?: No Do you have difficulty managing or taking your medications?: No  Follow up appointments reviewed: PCP  Follow-up appointment confirmed?: Yes Date of PCP follow-up appointment?: 06/15/23 Follow-up Provider: PCP Specialist Hospital Follow-up appointment confirmed?: No Reason Specialist Follow-Up Not Confirmed: Patient has Specialist Provider Number and will Call for Appointment Do you need transportation to your follow-up appointment?: No Do you understand care options if your condition(s) worsen?: Yes-patient verbalized understanding  SDOH Interventions Today    Flowsheet Row Most Recent Value  SDOH Interventions   Food Insecurity Interventions Intervention Not Indicated  Transportation Interventions Intervention Not Indicated  [family/ friends assisting as indicated after recent hospitalization]      TOC Interventions Today    Flowsheet Row Most Recent Value  TOC Interventions   TOC Interventions Discussed/Reviewed TOC Interventions Discussed  [Patient declines need for ongoing/ further care coordination outreach,  no care coordination needs identified at time of TOC call today,  provided my direct contact information should questions/ concerns/ needs arise post-TOC call]      Interventions Today    Flowsheet Row Most Recent Value  Chronic Disease   Chronic disease during today's visit Other  [GI bleeding with blood loss anemia]  General Interventions   General Interventions Discussed/Reviewed General Interventions Discussed, Durable Medical Equipment (DME), Doctor Visits  Doctor Visits Discussed/Reviewed Doctor Visits Discussed, PCP, Specialist  Durable Medical Equipment (DME) Other, BP Cuff  [confirmed not currently requiring/ using  assistive devices]  PCP/Specialist Visits Compliance with follow-up visit  Exercise Interventions   Exercise Discussed/Reviewed Exercise Discussed  [need to pace acivity and progress in activity slowly after blood loss anemia,  advised to not over-do acivity]  Education Interventions   Education Provided Provided Education  Provided Verbal  Education On Labs  Labs Reviewed --  [Hgb/ HCT]  Nutrition Interventions   Nutrition Discussed/Reviewed Nutrition Discussed  Pharmacy Interventions   Pharmacy Dicussed/Reviewed Pharmacy Topics Discussed  [Full medication review with updating medication list in EHR per patient report]  Safety Interventions   Safety Discussed/Reviewed Safety Discussed      Caryl Pina, RN, BSN, CCRN Alumnus RN CM Care Coordination/ Transition of Care- [facility] Care Management [phone]: direct office

## 2023-06-08 ENCOUNTER — Encounter (INDEPENDENT_AMBULATORY_CARE_PROVIDER_SITE_OTHER): Payer: Self-pay

## 2023-06-10 ENCOUNTER — Encounter (HOSPITAL_COMMUNITY)
Admission: RE | Admit: 2023-06-10 | Discharge: 2023-06-10 | Disposition: A | Discharge status: Home / Self Care | Payer: Medicare PPO | Source: Ambulatory Visit | Attending: Nephrology | Admitting: Nephrology

## 2023-06-10 DIAGNOSIS — E213 Hyperparathyroidism, unspecified: Secondary | ICD-10-CM | POA: Insufficient documentation

## 2023-06-10 MED ORDER — TECHNETIUM TC 99M SESTAMIBI GENERIC - CARDIOLITE
25.5000 | Freq: Once | INTRAVENOUS | Status: AC | PRN
  Administered 2023-06-10: 25.5 via INTRAVENOUS

## 2023-06-14 ENCOUNTER — Encounter: Payer: Self-pay | Admitting: Internal Medicine

## 2023-06-14 DIAGNOSIS — D500 Iron deficiency anemia secondary to blood loss (chronic): Secondary | ICD-10-CM | POA: Insufficient documentation

## 2023-06-14 NOTE — Patient Instructions (Signed)
      Blood work was ordered.   The lab is on the first floor.    Medications changes include :   start methocarbamol 500 mg - take at night.   Hold valsartan for now.      Return for reschedule August f/u for mid-late sept.

## 2023-06-14 NOTE — Progress Notes (Unsigned)
Subjective:    Patient ID: Joy Martin, female    DOB: 1941/02/08, 82 y.o.   MRN: [id number]     HPI Joy Martin is here for follow up from hospital  Admitted 7/27-7/29  Recommendations for Outpatient Follow-up:  Follow up with PCP in 1 week with repeat CBC/BMP Outpatient follow-up with GI Follow up in ED if symptoms worsen or new appear  Presented to Ed with BRBPR and lightheadedness/dizziness.  She had diarrhea 1 week prior and had intermittent bloody stools x several days. The day of presentation she had a large bloody BM and felt lightheaded/dizzy.    ED - VSS.  Hgb 8.2( baseline 12-13)  CT angiogram abd splenic artery aneurysm, iliac artery aneurysm, diverticulosis, hiatal hernia, pulm nodules.   Admitted for presmed GI bleed.    Lower GI bleed: Likely diverticular hemorrhage Hgb 8.2, hemodynamically stable GI consulted Conservative management Improved - bleeding stopped Hgb stable GI advised daily metamucil  Htn, hld: Monitor BP valsartan held Continued on amlodipine and nadolol Continued statin  COPD: Stable Inhalers  CKD: Stable  Hyperkalemia: valsartan held F/u labs - restart if needed    She is here alone.  She still feels weak - not much improvement in fatigue/weakness.  She thinks she was probably bleeding for a little while and did not notice it.  Just before she went to the hospital it was a lot of blood which is why she went.   Since being home she had one episode of dark stool - no brbpr.  Her stool has been brown in color.  She denies any abdominal pain.  Not taking iron - was not prescribed any.  She wondered if she should start it.  Having back pain - intermittent.  Hurts more when she is laying down  - wakes her up at night.  This is different than what she has had in the past.  Pain left mid- lower back - para-vertebral.  She was experiencing this in the hospital.  She has tried heat and has taken Excedrin which helps a  little.    Medications and allergies reviewed with patient and updated if appropriate.  Current Outpatient Medications on File Prior to Visit  Medication Sig Dispense Refill   albuterol (VENTOLIN HFA) 108 (90 Base) MCG/ACT inhaler Inhale 2 puffs into the lungs every 6 (six) hours as needed for wheezing or shortness of breath. 8 g 6   amLODipine (NORVASC) 5 MG tablet TAKE 1 TABLET BY MOUTH EVERY DAY (Patient taking differently: Take 5 mg by mouth in the morning.) 90 tablet 0   atorvastatin (LIPITOR) 20 MG tablet Take 1 tablet (20 mg total) by mouth daily. (Patient taking differently: Take 20 mg by mouth in the morning.) 90 tablet 3   Cranberry 500 MG CAPS Take 1,000 mg by mouth at bedtime.     cycloSPORINE (RESTASIS) 0.05 % ophthalmic emulsion Place 1 drop into both eyes in the morning and at bedtime.     denosumab (PROLIA) 60 MG/ML SOSY injection Inject 60 mg into the skin every 6 (six) months.     Erenumab-aooe (AIMOVIG) 70 MG/ML SOAJ INJECT 70MG  INTO THE SKIN EVERY 30 DAYS (Patient taking differently: Inject 70 mg into the skin every 30 (thirty) days.) 3 mL 3   esomeprazole (NEXIUM) 40 MG capsule TAKE 1 CAPSULE BY MOUTH DAILY AT 12 NOON. (Patient taking differently: Take 40 mg by mouth every other day.) 90 capsule 1   famotidine (PEPCID) 40 MG  tablet Take 1 tablet (40 mg total) by mouth at bedtime.     Flaxseed, Linseed, (FLAXSEED OIL PO) Take 1 capsule by mouth in the morning and at bedtime.     Fluticasone-Umeclidin-Vilant (TRELEGY ELLIPTA) 100-62.5-25 MCG/ACT AEPB Inhale 1 puff into the lungs daily. 2 each 5   latanoprost (XALATAN) 0.005 % ophthalmic solution Place 1 drop into both eyes at bedtime.     Multiple Vitamins-Minerals (PRESERVISION AREDS 2) CAPS Take 1 capsule by mouth in the morning and at bedtime.     nadolol (CORGARD) 40 MG tablet TAKE 1 TABLET BY MOUTH TWICE A DAY (Patient taking differently: Take 40 mg by mouth in the morning and at bedtime.) 180 tablet 1   psyllium  (HYDROCIL/METAMUCIL) 95 % PACK Take 1 packet by mouth daily. 30 each 0   [DISCONTINUED] Calcium Carbonate (CALCIUM 500 PO) Take by mouth.       No current facility-administered medications on file prior to visit.     Review of Systems  Constitutional:  Negative for appetite change, chills and fever.  Respiratory:  Positive for shortness of breath (chronic- maybe a little worse). Negative for cough and wheezing.   Cardiovascular:  Negative for chest pain, palpitations and leg swelling.  Gastrointestinal:  Positive for constipation and nausea (for last few days - ? related to benefiber). Negative for abdominal pain, blood in stool (no melena) and diarrhea.  Musculoskeletal:  Positive for back pain.  Neurological:  Positive for dizziness (occ) and headaches (neck related). Negative for light-headedness.       Objective:   Vitals:   06/15/23 0803  BP: 126/70  Pulse: 65  Temp: 98 F (36.7 C)  SpO2: 94%   BP Readings from Last 3 Encounters:  06/15/23 126/70  06/06/23 (!) 120/58  03/17/23 126/82   Wt Readings from Last 3 Encounters:  06/15/23 156 lb 6.4 oz (70.9 kg)  06/04/23 158 lb 8.2 oz (71.9 kg)  03/17/23 157 lb (71.2 kg)   Body mass index is 28.61 kg/m.    Physical Exam Constitutional:      General: She is not in acute distress.    Appearance: Normal appearance.  HENT:     Head: Normocephalic and atraumatic.  Eyes:     Conjunctiva/sclera: Conjunctivae normal.  Cardiovascular:     Rate and Rhythm: Normal rate and regular rhythm.     Heart sounds: Normal heart sounds.  Pulmonary:     Effort: Pulmonary effort is normal. No respiratory distress.     Breath sounds: Normal breath sounds. No wheezing.  Abdominal:     General: There is no distension.     Palpations: Abdomen is soft.     Tenderness: There is no abdominal tenderness. There is no guarding or rebound.  Musculoskeletal:        General: Tenderness (Left mid back to lower back paravertebral muscles-mild  tenderness.  No lumbar spine tenderness) present.     Cervical back: Neck supple.     Right lower leg: No edema.     Left lower leg: No edema.  Lymphadenopathy:     Cervical: No cervical adenopathy.  Skin:    General: Skin is warm and dry.     Findings: No rash.  Neurological:     Mental Status: She is alert. Mental status is at baseline.  Psychiatric:        Mood and Affect: Mood normal.        Behavior: Behavior normal.  Lab Results  Component Value Date   WBC 7.8 06/06/2023   HGB 7.8 (L) 06/06/2023   HCT 27.1 (L) 06/06/2023   PLT 328 06/06/2023   GLUCOSE 108 (H) 06/06/2023   CHOL 158 12/31/2022   TRIG 105.0 12/31/2022   HDL 71.00 12/31/2022   LDLDIRECT 143.2 12/19/2007   LDLCALC 66 12/31/2022   ALT 21 12/31/2022   AST 27 12/31/2022   NA 139 06/06/2023   K 5.3 (H) 06/06/2023   CL 110 06/06/2023   CREATININE 1.01 (H) 06/06/2023   BUN 17 06/06/2023   CO2 22 06/06/2023   TSH 2.26 12/05/2020   HGBA1C 6.1 12/31/2022   CT Angio Abd/Pel W and/or Wo Contrast CLINICAL DATA:  Lower GI bleed dizziness  EXAM: CTA ABDOMEN AND PELVIS WITHOUT AND WITH CONTRAST  TECHNIQUE: Multidetector CT imaging of the abdomen and pelvis was performed using the standard protocol during bolus administration of intravenous contrast. Multiplanar reconstructed images and MIPs were obtained and reviewed to evaluate the vascular anatomy.  RADIATION DOSE REDUCTION: This exam was performed according to the departmental dose-optimization program which includes automated exposure control, adjustment of the mA and/or kV according to patient size and/or use of iterative reconstruction technique.  CONTRAST:  80mL OMNIPAQUE IOHEXOL 350 MG/ML SOLN  COMPARISON:  CT 07/10/2012  FINDINGS: VASCULAR  Aorta: Non contrasted images demonstrate no acute intramural hematoma. Negative for aortic dissection, aneurysm, or significant stenosis.  Celiac: Patent without evidence of aneurysm,  dissection, vasculitis or significant stenosis. 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  SMA: Patent without evidence of aneurysm, dissection, vasculitis or significant stenosis.  Renals: Both renal arteries are patent without evidence of aneurysm, dissection, vasculitis, fibromuscular dysplasia or significant stenosis.  IMA: Patent without evidence of aneurysm, dissection, vasculitis or significant stenosis.  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 No dissection. Patent internal and external iliac vessels.  Proximal Outflow: Bilateral common femoral and visualized portions of the superficial and profunda femoral arteries are patent without evidence of aneurysm, dissection, vasculitis or significant stenosis.  Veins: No obvious venous abnormality within the limitations of this arterial phase study.  Review of the MIP images confirms the above findings.  NON-VASCULAR  Lower chest: Lung bases demonstrate scattered areas of minimal bronchiectasis and clustered nodularity. Punctate nodules measuring up to 4 mm at the lingula, series 19 image 4. Moderate hiatal hernia.  Hepatobiliary: Distended gallbladder without calcified stone. No biliary dilatation. No focal hepatic abnormality.  Pancreas: Unremarkable. No pancreatic ductal dilatation or surrounding inflammatory changes.  Spleen: Normal in size without focal abnormality.  Adrenals/Urinary Tract: Adrenal glands are normal. Small nonobstructing left kidney stone. No hydronephrosis. Cyst upper pole left kidney, no imaging follow-up is recommended. The bladder is normal  Stomach/Bowel: Stomach nonenlarged. No dilated small bowel. No acute bowel wall thickening. Diverticular disease of the colon without acute wall thickening. Slightly dense intraluminal contents within the descending colon and splenic flexure, but no evidence for  active intraluminal extravasation to suggest active GI bleeding on this exam.  Lymphatic: No suspicious lymph nodes  Reproductive: Uterus and bilateral adnexa are unremarkable.  Other: Negative for pelvic effusion or free air.  Musculoskeletal: Grade 1 anterolisthesis L4 on L5 with advanced degenerative changes.  IMPRESSION: 1. Negative for acute aortic dissection. No evidence for intraluminal contrast extravasation to suggest active GI bleeding at this time. 2. 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 3. Diverticular disease of the colon without  acute wall thickening. 4. Moderate hiatal hernia. 5. Scattered areas of minimal bronchiectasis and clustered nodularity at the lung bases, possible chronic atypical infection. Punctate nodules measuring up to 4 mm at the lingula. No follow-up needed if patient is low-risk.This recommendation follows the consensus statement: Guidelines for Management of Incidental Pulmonary Nodules Detected on CT Images: From the Fleischner Society 2017; Radiology 2017; 284:228-243.  Electronically Signed   By: Jasmine Pang M.D.   On: 06/04/2023 17:33    Assessment & Plan:    See Problem List for Assessment and Plan of chronic medical problems.

## 2023-06-15 ENCOUNTER — Ambulatory Visit: Payer: Medicare PPO | Admitting: Internal Medicine

## 2023-06-15 VITALS — BP 126/70 | HR 65 | Temp 98.0°F | Ht 62.0 in | Wt 156.4 lb

## 2023-06-15 DIAGNOSIS — I10 Essential (primary) hypertension: Secondary | ICD-10-CM | POA: Diagnosis not present

## 2023-06-15 DIAGNOSIS — G8929 Other chronic pain: Secondary | ICD-10-CM

## 2023-06-15 DIAGNOSIS — Z8719 Personal history of other diseases of the digestive system: Secondary | ICD-10-CM

## 2023-06-15 DIAGNOSIS — M545 Low back pain, unspecified: Secondary | ICD-10-CM | POA: Diagnosis not present

## 2023-06-15 DIAGNOSIS — N1831 Chronic kidney disease, stage 3a: Secondary | ICD-10-CM

## 2023-06-15 DIAGNOSIS — D500 Iron deficiency anemia secondary to blood loss (chronic): Secondary | ICD-10-CM

## 2023-06-15 LAB — CBC WITH DIFFERENTIAL/PLATELET
Basophils Absolute: 0.1 10*3/uL (ref 0.0–0.1)
Basophils Relative: 1.1 % (ref 0.0–3.0)
Eosinophils Absolute: 0.3 10*3/uL (ref 0.0–0.7)
Eosinophils Relative: 3.4 % (ref 0.0–5.0)
HCT: 26.9 % — ABNORMAL LOW (ref 36.0–46.0)
Hemoglobin: 8.4 g/dL — ABNORMAL LOW (ref 12.0–15.0)
Lymphocytes Relative: 11.5 % — ABNORMAL LOW (ref 12.0–46.0)
Lymphs Abs: 1.1 10*3/uL (ref 0.7–4.0)
MCHC: 31.2 g/dL (ref 30.0–36.0)
MCV: 88.6 fl (ref 78.0–100.0)
Monocytes Absolute: 0.9 10*3/uL (ref 0.1–1.0)
Monocytes Relative: 8.6 % (ref 3.0–12.0)
Neutro Abs: 7.4 10*3/uL (ref 1.4–7.7)
Neutrophils Relative %: 75.4 % (ref 43.0–77.0)
Platelets: 503 10*3/uL — ABNORMAL HIGH (ref 150.0–400.0)
RBC: 3.03 Mil/uL — ABNORMAL LOW (ref 3.87–5.11)
RDW: 15.1 % (ref 11.5–15.5)
WBC: 9.9 10*3/uL (ref 4.0–10.5)

## 2023-06-15 LAB — BASIC METABOLIC PANEL
BUN: 24 mg/dL — ABNORMAL HIGH (ref 6–23)
CO2: 24 mEq/L (ref 19–32)
Calcium: 10.3 mg/dL (ref 8.4–10.5)
Chloride: 110 mEq/L (ref 96–112)
Creatinine, Ser: 1.23 mg/dL — ABNORMAL HIGH (ref 0.40–1.20)
GFR: 40.41 mL/min — ABNORMAL LOW (ref >60.00)
Glucose, Bld: 105 mg/dL — ABNORMAL HIGH (ref 70–99)
Potassium: 4.5 mEq/L (ref 3.5–5.1)
Sodium: 142 mEq/L (ref 135–145)

## 2023-06-15 MED ORDER — METHOCARBAMOL 500 MG PO TABS: 500.0000 mg | ORAL_TABLET | Freq: Three times a day (TID) | ORAL | 0 refills | Status: DC | PRN

## 2023-06-15 MED ORDER — AIMOVIG 70 MG/ML ~~LOC~~ SOAJ: SUBCUTANEOUS | 3 refills | Status: DC

## 2023-06-15 NOTE — Assessment & Plan Note (Signed)
Acute on chronic Experiencing acute left lower back pain-appears to be focused in the paravertebral muscles Currently taking Excedrin, has tried heat-has provided only temporary improvement Likely muscle strain/spasm Continue heat Trial of methocarbamol 500 mg 3 times daily as needed-will try just taking that night which is when she has the most pain If not improving she will let me know-could consider short course of tramadol to help with the pain if needed

## 2023-06-15 NOTE — Assessment & Plan Note (Addendum)
New Related to acute diverticular bleed Bleeding ceased while in the hospital-no additional bleeding since Start otc iron - titrate based on side effects Check CBC today Discussed that it will take 2-3 months her blood counts to return to normal and her energy level should slowly improve

## 2023-06-15 NOTE — Assessment & Plan Note (Addendum)
Chronic Blood pressure well controlled Valsartan held in hospital - has not restarted - had hyperkalemia and GI bleed Continue amlodipine 5 mg daily, nadolol 40 mg twice daily Continue to hold valsartan 320 mg daily for now and monitor BP

## 2023-06-15 NOTE — Assessment & Plan Note (Signed)
Recent hospitalization for diverticular bleed Improved with conservative treatment Hgb initially dropped to 8.2 but remained stable  Bleeding stoppped in hospital No bleeding since discharge Cbc, bmp today

## 2023-06-15 NOTE — Assessment & Plan Note (Addendum)
Chronic Following with nephrology Stable in hospital Continue to hold valsartan given hyperkalemia in the hospital and low BP BMP today

## 2023-06-17 ENCOUNTER — Other Ambulatory Visit: Payer: Self-pay | Admitting: Internal Medicine

## 2023-06-23 ENCOUNTER — Encounter (INDEPENDENT_AMBULATORY_CARE_PROVIDER_SITE_OTHER): Payer: Self-pay

## 2023-07-05 ENCOUNTER — Encounter: Payer: Medicare PPO | Admitting: Internal Medicine

## 2023-07-12 DIAGNOSIS — N1832 Chronic kidney disease, stage 3b: Secondary | ICD-10-CM | POA: Diagnosis not present

## 2023-07-19 DIAGNOSIS — D631 Anemia in chronic kidney disease: Secondary | ICD-10-CM | POA: Diagnosis not present

## 2023-07-19 DIAGNOSIS — N1832 Chronic kidney disease, stage 3b: Secondary | ICD-10-CM | POA: Diagnosis not present

## 2023-07-19 DIAGNOSIS — E213 Hyperparathyroidism, unspecified: Secondary | ICD-10-CM | POA: Diagnosis not present

## 2023-07-19 DIAGNOSIS — I129 Hypertensive chronic kidney disease with stage 1 through stage 4 chronic kidney disease, or unspecified chronic kidney disease: Secondary | ICD-10-CM | POA: Diagnosis not present

## 2023-07-26 NOTE — Progress Notes (Unsigned)
Subjective:    Patient ID: Joy Martin, female    DOB: 16-Mar-1941, 82 y.o.   MRN: [id number]     HPI Joy Martin is here for follow up of her chronic medical problems.  Feels a little stronger.  Going to see Dr Joy Martin for evaluation of hyperparathyroidism  Medications and allergies reviewed with patient and updated if appropriate.  Current Outpatient Medications on File Prior to Visit  Medication Sig Dispense Refill   albuterol (VENTOLIN HFA) 108 (90 Base) MCG/ACT inhaler Inhale 2 puffs into the lungs every 6 (six) hours as needed for wheezing or shortness of breath. 8 g 6   amLODipine (NORVASC) 5 MG tablet TAKE 1 TABLET BY MOUTH EVERY DAY 90 tablet 0   atorvastatin (LIPITOR) 20 MG tablet TAKE 1 TABLET BY MOUTH EVERY DAY 90 tablet 3   Cranberry 500 MG CAPS Take 1,000 mg by mouth at bedtime.     cycloSPORINE (RESTASIS) 0.05 % ophthalmic emulsion Place 1 drop into both eyes in the morning and at bedtime.     denosumab (PROLIA) 60 MG/ML SOSY injection Inject 60 mg into the skin every 6 (six) months.     Erenumab-aooe (AIMOVIG) 70 MG/ML SOAJ INJECT 70MG  INTO THE SKIN EVERY 30 DAYS 3 mL 3   esomeprazole (NEXIUM) 40 MG capsule TAKE 1 CAPSULE BY MOUTH DAILY AT 12 NOON. (Patient taking differently: Take 40 mg by mouth every other day.) 90 capsule 1   famotidine (PEPCID) 40 MG tablet Take 1 tablet (40 mg total) by mouth at bedtime.     Flaxseed, Linseed, (FLAXSEED OIL PO) Take 1 capsule by mouth in the morning and at bedtime.     Fluticasone-Umeclidin-Vilant (TRELEGY ELLIPTA) 100-62.5-25 MCG/ACT AEPB Inhale 1 puff into the lungs daily. 2 each 5   latanoprost (XALATAN) 0.005 % ophthalmic solution Place 1 drop into both eyes at bedtime.     Multiple Vitamins-Minerals (PRESERVISION AREDS 2) CAPS Take 1 capsule by mouth in the morning and at bedtime.     nadolol (CORGARD) 40 MG tablet TAKE 1 TABLET BY MOUTH TWICE A DAY (Patient taking differently: Take 40 mg by mouth in the morning and at  bedtime.) 180 tablet 1   psyllium (HYDROCIL/METAMUCIL) 95 % PACK Take 1 packet by mouth daily. 30 each 0   [DISCONTINUED] Calcium Carbonate (CALCIUM 500 PO) Take by mouth.       No current facility-administered medications on file prior to visit.     Review of Systems  Constitutional:  Negative for appetite change and fever.  Respiratory:  Positive for cough (every once in a while - drainage or dryness) and shortness of breath (chronic - maybe a little better). Negative for wheezing.   Cardiovascular:  Positive for leg swelling (top of feet). Negative for chest pain and palpitations.  Gastrointestinal:  Positive for constipation (controlled). Negative for abdominal pain, blood in stool (no black stool) and diarrhea.  Neurological:  Positive for headaches (occ). Negative for light-headedness.       Objective:   Vitals:   07/27/23 0845  BP: 128/74  Pulse: 80  Temp: 98 F (36.7 C)  SpO2: 94%   BP Readings from Last 3 Encounters:  07/27/23 128/74  06/15/23 126/70  06/06/23 (!) 120/58   Wt Readings from Last 3 Encounters:  07/27/23 156 lb 12.8 oz (71.1 kg)  06/15/23 156 lb 6.4 oz (70.9 kg)  06/04/23 158 lb 8.2 oz (71.9 kg)   Body mass index is 28.68 kg/m.  Physical Exam Constitutional:      General: She is not in acute distress.    Appearance: Normal appearance.  HENT:     Head: Normocephalic and atraumatic.  Eyes:     Conjunctiva/sclera: Conjunctivae normal.  Cardiovascular:     Rate and Rhythm: Normal rate and regular rhythm.     Heart sounds: Normal heart sounds.  Pulmonary:     Effort: Pulmonary effort is normal. No respiratory distress.     Breath sounds: Normal breath sounds. No wheezing.  Musculoskeletal:     Cervical back: Neck supple.     Right lower leg: No edema.     Left lower leg: No edema.  Lymphadenopathy:     Cervical: No cervical adenopathy.  Skin:    General: Skin is warm and dry.     Findings: No rash.  Neurological:     Mental Status:  She is alert. Mental status is at baseline.  Psychiatric:        Mood and Affect: Mood normal.        Behavior: Behavior normal.        Lab Results  Component Value Date   WBC 9.9 06/15/2023   HGB 8.4 (L) 06/15/2023   HCT 26.9 (L) 06/15/2023   PLT 503.0 (H) 06/15/2023   GLUCOSE 105 (H) 06/15/2023   CHOL 158 12/31/2022   TRIG 105.0 12/31/2022   HDL 71.00 12/31/2022   LDLDIRECT 143.2 12/19/2007   LDLCALC 66 12/31/2022   ALT 21 12/31/2022   AST 27 12/31/2022   NA 142 06/15/2023   K 4.5 06/15/2023   CL 110 06/15/2023   CREATININE 1.23 (H) 06/15/2023   BUN 24 (H) 06/15/2023   CO2 24 06/15/2023   TSH 2.26 12/05/2020   HGBA1C 6.1 12/31/2022     Assessment & Plan:    See Problem List for Assessment and Plan of chronic medical problems.

## 2023-07-26 NOTE — Patient Instructions (Addendum)
      Blood work was ordered.   The lab is on the first floor.    Medications changes include :       A referral was ordered and someone will call you to schedule an appointment.     Return in about 6 months (around 01/24/2024) for Physical Exam.

## 2023-07-27 ENCOUNTER — Encounter: Payer: Self-pay | Admitting: Internal Medicine

## 2023-07-27 ENCOUNTER — Ambulatory Visit: Payer: Medicare PPO | Admitting: Internal Medicine

## 2023-07-27 VITALS — BP 128/74 | HR 80 | Temp 98.0°F | Ht 62.0 in | Wt 156.8 lb

## 2023-07-27 DIAGNOSIS — I10 Essential (primary) hypertension: Secondary | ICD-10-CM

## 2023-07-27 DIAGNOSIS — K219 Gastro-esophageal reflux disease without esophagitis: Secondary | ICD-10-CM

## 2023-07-27 DIAGNOSIS — E7800 Pure hypercholesterolemia, unspecified: Secondary | ICD-10-CM | POA: Diagnosis not present

## 2023-07-27 DIAGNOSIS — D500 Iron deficiency anemia secondary to blood loss (chronic): Secondary | ICD-10-CM | POA: Diagnosis not present

## 2023-07-27 DIAGNOSIS — N1831 Chronic kidney disease, stage 3a: Secondary | ICD-10-CM | POA: Diagnosis not present

## 2023-07-27 DIAGNOSIS — M810 Age-related osteoporosis without current pathological fracture: Secondary | ICD-10-CM

## 2023-07-27 DIAGNOSIS — R7303 Prediabetes: Secondary | ICD-10-CM

## 2023-07-27 LAB — CBC WITH DIFFERENTIAL/PLATELET
Basophils Absolute: 0.1 10*3/uL (ref 0.0–0.1)
Basophils Relative: 0.7 % (ref 0.0–3.0)
Eosinophils Absolute: 0.3 10*3/uL (ref 0.0–0.7)
Eosinophils Relative: 3.4 % (ref 0.0–5.0)
HCT: 34.9 % — ABNORMAL LOW (ref 36.0–46.0)
Hemoglobin: 10.6 g/dL — ABNORMAL LOW (ref 12.0–15.0)
Lymphocytes Relative: 12 % (ref 12.0–46.0)
Lymphs Abs: 1.1 10*3/uL (ref 0.7–4.0)
MCHC: 30.4 g/dL (ref 30.0–36.0)
MCV: 87.4 fl (ref 78.0–100.0)
Monocytes Absolute: 0.7 10*3/uL (ref 0.1–1.0)
Monocytes Relative: 7.2 % (ref 3.0–12.0)
Neutro Abs: 7.2 10*3/uL (ref 1.4–7.7)
Neutrophils Relative %: 76.7 % (ref 43.0–77.0)
Platelets: 328 10*3/uL (ref 150.0–400.0)
RBC: 4 Mil/uL (ref 3.87–5.11)
RDW: 18.6 % — ABNORMAL HIGH (ref 11.5–15.5)
WBC: 9.4 10*3/uL (ref 4.0–10.5)

## 2023-07-27 LAB — COMPREHENSIVE METABOLIC PANEL WITH GFR
ALT: 13 U/L (ref 0–35)
AST: 16 U/L (ref 0–37)
Albumin: 3.8 g/dL (ref 3.5–5.2)
Alkaline Phosphatase: 106 U/L (ref 39–117)
BUN: 18 mg/dL (ref 6–23)
CO2: 26 meq/L (ref 19–32)
Calcium: 9.2 mg/dL (ref 8.4–10.5)
Chloride: 109 meq/L (ref 96–112)
Creatinine, Ser: 1.06 mg/dL (ref 0.40–1.20)
GFR: 48.27 mL/min — ABNORMAL LOW (ref >60.00)
Glucose, Bld: 91 mg/dL (ref 70–99)
Potassium: 4.4 meq/L (ref 3.5–5.1)
Sodium: 143 meq/L (ref 135–145)
Total Bilirubin: 0.2 mg/dL (ref 0.2–1.2)
Total Protein: 7.1 g/dL (ref 6.0–8.3)

## 2023-07-27 LAB — LIPID PANEL
Cholesterol: 119 mg/dL (ref 0–200)
HDL: 55.6 mg/dL (ref >39.00)
LDL Cholesterol: 43 mg/dL (ref 0–99)
NonHDL: 63.26
Total CHOL/HDL Ratio: 2
Triglycerides: 100 mg/dL (ref 0.0–149.0)
VLDL: 20 mg/dL (ref 0.0–40.0)

## 2023-07-27 LAB — IBC PANEL
Iron: 27 ug/dL — ABNORMAL LOW (ref 42–145)
Saturation Ratios: 7 % — ABNORMAL LOW (ref 20.0–50.0)
TIBC: 383.6 ug/dL (ref 250.0–450.0)
Transferrin: 274 mg/dL (ref 212.0–360.0)

## 2023-07-27 LAB — FERRITIN: Ferritin: 24.3 ng/mL (ref 10.0–291.0)

## 2023-07-27 LAB — VITAMIN D 25 HYDROXY (VIT D DEFICIENCY, FRACTURES): VITD: 26.5 ng/mL — ABNORMAL LOW (ref 30.00–100.00)

## 2023-07-27 LAB — HEMOGLOBIN A1C: Hgb A1c MFr Bld: 5.6 % (ref 4.6–6.5)

## 2023-07-27 NOTE — Assessment & Plan Note (Signed)
Subacute  Related to diverticular bleed - hospitalized 06/2023 Bleeding ceased while in the hospital-no additional bleeding since taking otc iron Check CBC,iron panel today

## 2023-07-27 NOTE — Assessment & Plan Note (Signed)
Chronic Following with nephrology Valsartan stopped 06/2023 due to hyperkalemia BP controlled Cmp, cbc

## 2023-07-27 NOTE — Assessment & Plan Note (Signed)
Chronic Continue Prolia every 6 months Continue vitamin D daily Check vitamin D level Due for Prolia 09/2023

## 2023-07-27 NOTE — Assessment & Plan Note (Signed)
Chronic GERD controlled Continue Nexium 40 mg daily, famotidine 40 mg daily

## 2023-07-27 NOTE — Assessment & Plan Note (Signed)
Chronic Blood pressure well controlled Valsartan stopped last month for hyperkalemia Continue amlodipine 5 mg daily, nadolol 40 mg twice daily Continue to monitor BP

## 2023-07-27 NOTE — Assessment & Plan Note (Signed)
Chronic Check lipid panel  Continue atorvastatin 20 mg daily Regular exercise and healthy diet encouraged

## 2023-07-27 NOTE — Assessment & Plan Note (Signed)
Chronic Check a1c Low sugar / carb diet encouraged regular exercise

## 2023-07-29 ENCOUNTER — Ambulatory Visit: Payer: BC Managed Care – PPO | Admitting: Internal Medicine

## 2023-08-05 ENCOUNTER — Other Ambulatory Visit: Payer: Self-pay | Admitting: Pulmonary Disease

## 2023-08-30 DIAGNOSIS — E210 Primary hyperparathyroidism: Secondary | ICD-10-CM | POA: Diagnosis not present

## 2023-08-31 ENCOUNTER — Other Ambulatory Visit: Payer: Self-pay | Admitting: Surgery

## 2023-08-31 ENCOUNTER — Encounter: Payer: Self-pay | Admitting: Surgery

## 2023-08-31 DIAGNOSIS — E210 Primary hyperparathyroidism: Secondary | ICD-10-CM

## 2023-09-01 ENCOUNTER — Telehealth: Payer: Self-pay | Admitting: Pulmonary Disease

## 2023-09-01 ENCOUNTER — Ambulatory Visit
Admission: RE | Admit: 2023-09-01 | Discharge: 2023-09-01 | Disposition: A | Discharge status: Home / Self Care | Payer: Medicare PPO | Source: Ambulatory Visit | Attending: Surgery | Admitting: Surgery

## 2023-09-01 DIAGNOSIS — E041 Nontoxic single thyroid nodule: Secondary | ICD-10-CM | POA: Diagnosis not present

## 2023-09-01 DIAGNOSIS — E210 Primary hyperparathyroidism: Secondary | ICD-10-CM

## 2023-09-01 NOTE — Telephone Encounter (Signed)
Fax received from Dr. Darnell Level with [facility] to perform a parathyroidectomy surgery on patient under general anesthesia.  Patient needs surgery clearance. Surgery is pending. Patient was seen on 03/17/23. Office protocol is a risk assessment can be sent to surgeon if patient has been seen in 60 days or less.   Sending to Dr Francine Graven for risk assessment or recommendations if patient needs to be seen in office prior to surgical procedure.

## 2023-09-10 NOTE — Telephone Encounter (Signed)
ARISCAT Score for Postoperative Pulmonary Complications Predicts risk of pulmonary complications after surgery, including respiratory failure.  Intermediate risk 13.3% risk of in-hospital post-op pulmonary complications (composite including respiratory failure, respiratory infection, pleural effusion, atelectasis, pneumothorax, bronchospasm, aspiration pneumonitis)

## 2023-09-13 ENCOUNTER — Other Ambulatory Visit: Payer: Self-pay | Admitting: Internal Medicine

## 2023-09-13 ENCOUNTER — Ambulatory Visit: Payer: Self-pay | Admitting: Surgery

## 2023-09-13 DIAGNOSIS — R7303 Prediabetes: Secondary | ICD-10-CM | POA: Diagnosis not present

## 2023-09-13 DIAGNOSIS — H3530 Unspecified macular degeneration: Secondary | ICD-10-CM | POA: Diagnosis not present

## 2023-09-13 DIAGNOSIS — M1990 Unspecified osteoarthritis, unspecified site: Secondary | ICD-10-CM | POA: Diagnosis not present

## 2023-09-13 DIAGNOSIS — G43909 Migraine, unspecified, not intractable, without status migrainosus: Secondary | ICD-10-CM | POA: Diagnosis not present

## 2023-09-13 DIAGNOSIS — E785 Hyperlipidemia, unspecified: Secondary | ICD-10-CM | POA: Diagnosis not present

## 2023-09-13 DIAGNOSIS — M545 Low back pain, unspecified: Secondary | ICD-10-CM | POA: Diagnosis not present

## 2023-09-13 DIAGNOSIS — J309 Allergic rhinitis, unspecified: Secondary | ICD-10-CM | POA: Diagnosis not present

## 2023-09-13 DIAGNOSIS — M810 Age-related osteoporosis without current pathological fracture: Secondary | ICD-10-CM | POA: Diagnosis not present

## 2023-09-13 DIAGNOSIS — K219 Gastro-esophageal reflux disease without esophagitis: Secondary | ICD-10-CM | POA: Diagnosis not present

## 2023-09-13 NOTE — Progress Notes (Signed)
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  Will plan to proceed with minimally invasive parathyroidectomy as an outpatient procedure.  [facility] End[facility] - please send orders to schedulers and have them contact the patient.  Darnell Level, MD [facility] practice Office: [phone]

## 2023-09-19 NOTE — Telephone Encounter (Signed)
Copy of this note was faxed to Dr Darnell Level Closing encounter

## 2023-09-27 DIAGNOSIS — H04123 Dry eye syndrome of bilateral lacrimal glands: Secondary | ICD-10-CM | POA: Diagnosis not present

## 2023-09-27 DIAGNOSIS — H40023 Open angle with borderline findings, high risk, bilateral: Secondary | ICD-10-CM | POA: Diagnosis not present

## 2023-09-27 DIAGNOSIS — H35033 Hypertensive retinopathy, bilateral: Secondary | ICD-10-CM | POA: Diagnosis not present

## 2023-09-27 DIAGNOSIS — H353131 Nonexudative age-related macular degeneration, bilateral, early dry stage: Secondary | ICD-10-CM | POA: Diagnosis not present

## 2023-09-30 NOTE — Patient Instructions (Signed)
SURGICAL WAITING ROOM VISITATION  Patients having surgery or a procedure may have no more than 2 support people in the waiting area - these visitors may rotate.    Children under the age of 1 must have an adult with them who is not the patient.  Due to an increase in RSV and influenza rates and associated hospitalizations, children ages 49 and under may not visit patients in [facility] hospitals.  If the patient needs to stay at the hospital during part of their recovery, the visitor guidelines for inpatient rooms apply. Pre-op nurse will coordinate an appropriate time for 1 support person to accompany patient in pre-op.  This support person may not rotate.    Please refer to the [facility] website for the visitor guidelines for Inpatients (after your surgery is over and you are in a regular room).    Your procedure is scheduled on: 10/21/23   Report to [facility] Main Entrance    Report to admitting at 7:15 AM   Call this number if you have problems the morning of surgery [phone]   Do not eat food :After Midnight.   After Midnight you may have the following liquids until 6:30 AM DAY OF SURGERY  Water Non-Citrus Juices (without pulp, NO RED-Apple, White grape, White cranberry) Black Coffee (NO MILK/CREAM OR CREAMERS, sugar ok)  Clear Tea (NO MILK/CREAM OR CREAMERS, sugar ok) regular and decaf                             Plain Jell-O (NO RED)                                           Fruit ices (not with fruit pulp, NO RED)                                     Popsicles (NO RED)                                                               Sports drinks like Gatorade (NO RED)                 The day of surgery:  Drink ONE (1) Pre-Surgery G2 at 6:30 AM the morning of surgery. Drink in one sitting. Do not sip.  This drink was given to you during your hospital  pre-op appointment visit. Nothing else to drink after completing the  Pre-Surgery G2.          If you  have questions, please contact your surgeon's office.   FOLLOW BOWEL PREP AND ANY ADDITIONAL PRE OP INSTRUCTIONS YOU RECEIVED FROM YOUR SURGEON'S OFFICE!!!     Oral Hygiene is also important to reduce your risk of infection.                                    Remember - BRUSH YOUR TEETH THE MORNING OF SURGERY WITH YOUR REGULAR TOOTHPASTE  DENTURES WILL BE REMOVED  PRIOR TO SURGERY PLEASE DO NOT APPLY "Poly grip" OR ADHESIVES!!!   Stop all vitamins and herbal supplements 7 days before surgery.   Take these medicines the morning of surgery with A SIP OF WATER: Albuterol ,Amlodipine, Atorvastatin, Nadolol              You may not have any metal on your body including hair pins, jewelry, and body piercing             Do not wear make-up, lotions, powders, perfumes, or deodorant  Do not wear nail polish including gel and S&S, artificial/acrylic nails, or any other type of covering on natural nails including finger and toenails. If you have artificial nails, gel coating, etc. that needs to be removed by a nail salon please have this removed prior to surgery or surgery may need to be canceled/ delayed if the surgeon/ anesthesia feels like they are unable to be safely monitored.   Do not shave  48 hours prior to surgery.    Do not bring valuables to the hospital. [place] IS NOT             RESPONSIBLE   FOR VALUABLES.   Contacts, glasses, dentures or bridgework may not be worn into surgery.  DO NOT BRING YOUR HOME MEDICATIONS TO THE HOSPITAL. PHARMACY WILL DISPENSE MEDICATIONS LISTED ON YOUR MEDICATION LIST TO YOU DURING YOUR ADMISSION IN THE HOSPITAL!    Patients discharged on the day of surgery will not be allowed to drive home.  Someone NEEDS to stay with you for the first 24 hours after anesthesia.   Special Instructions: Bring a copy of your healthcare power of attorney and living will documents the day of surgery if you haven't scanned them before.              Please read over  the following fact sheets you were given: IF YOU HAVE QUESTIONS ABOUT YOUR PRE-OP INSTRUCTIONS PLEASE CALL [name]    If you received a COVID test during your pre-op visit  it is requested that you wear a mask when out in public, stay away from anyone that may not be feeling well and notify your surgeon if you develop symptoms. If you test positive for Covid or have been in contact with anyone that has tested positive in the last 10 days please notify you surgeon.    [facility] - Preparing for Surgery Before surgery, you can play an important role.  Because skin is not sterile, your skin needs to be as free of germs as possible.  You can reduce the number of germs on your skin by washing with CHG (chlorahexidine gluconate) soap before surgery.  CHG is an antiseptic cleaner which kills germs and bonds with the skin to continue killing germs even after washing. Please DO NOT use if you have an allergy to CHG or antibacterial soaps.  If your skin becomes reddened/irritated stop using the CHG and inform your nurse when you arrive at Short Stay. Do not shave (including legs and underarms) for at least 48 hours prior to the first CHG shower.  You may shave your face/neck.  Please follow these instructions carefully:  1.  Shower with CHG Soap the night before surgery and the  morning of surgery.  2.  If you choose to wash your hair, wash your hair first as usual with your normal  shampoo.  3.  After you shampoo, rinse your hair and body thoroughly to remove the shampoo.  4.  Use CHG as you would any other liquid soap.  You can apply chg directly to the skin and wash.  Gently with a scrungie or clean washcloth.  5.  Apply the CHG Soap to your body ONLY FROM THE NECK DOWN.   Do   not use on face/ open                           Wound or open sores. Avoid contact with eyes, ears mouth and   genitals (private parts).                       Wash face,  Genitals (private  parts) with your normal soap.             6.  Wash thoroughly, paying special attention to the area where your    surgery  will be performed.  7.  Thoroughly rinse your body with warm water from the neck down.  8.  DO NOT shower/wash with your normal soap after using and rinsing off the CHG Soap.                9.  Pat yourself dry with a clean towel.            10.  Wear clean pajamas.            11.  Place clean sheets on your bed the night of your first shower and do not  sleep with pets. Day of Surgery : Do not apply any lotions/deodorants the morning of surgery.  Please wear clean clothes to the hospital/surgery center.  FAILURE TO FOLLOW THESE INSTRUCTIONS MAY RESULT IN THE CANCELLATION OF YOUR SURGERY  PATIENT SIGNATURE_________________________________  NURSE SIGNATURE__________________________________  ________________________________________________________________________

## 2023-09-30 NOTE — Progress Notes (Signed)
COVID Vaccine Completed: yes  Date of COVID positive in last 90 days:  PCP - Cheryll Cockayne, MD Cardiologist - Jodelle Red, MD LOV 12/14/21 Pulmonologist- Melody Comas, MD  CT- 03/14/23 Epic Chest x-ray -  EKG - 06/06/23 Epic Stress Test - 12/23/21 Epic ECHO - 11/26/21 Epic Cardiac Cath -  Pacemaker/ICD device last checked: Spinal Cord Stimulator:  Bowel Prep -   Sleep Study -  CPAP -   Fasting Blood Sugar -  Checks Blood Sugar _____ times a day  Last dose of GLP1 agonist-  N/A GLP1 instructions:  Hold 7 days before surgery    Last dose of SGLT-2 inhibitors-  N/A SGLT-2 instructions:  Hold 3 days before surgery    Blood Thinner Instructions:  Time Aspirin Instructions: Last Dose:  Activity level:  Can go up a flight of stairs and perform activities of daily living without stopping and without symptoms of chest pain or shortness of breath.  Able to exercise without symptoms  Unable to go up a flight of stairs without symptoms of     Anesthesia review: COPD, HTN, CKD, anemia  Patient denies shortness of breath, fever, cough and chest pain at PAT appointment  Patient verbalized understanding of instructions that were given to them at the PAT appointment. Patient was also instructed that they will need to review over the PAT instructions again at home before surgery.

## 2023-10-03 ENCOUNTER — Encounter (HOSPITAL_COMMUNITY)
Admission: RE | Admit: 2023-10-03 | Discharge: 2023-10-03 | Disposition: A | Discharge status: Home / Self Care | Payer: Medicare PPO | Source: Ambulatory Visit | Attending: Surgery | Admitting: Surgery

## 2023-10-03 ENCOUNTER — Encounter (HOSPITAL_COMMUNITY): Payer: Self-pay

## 2023-10-03 ENCOUNTER — Other Ambulatory Visit: Payer: Self-pay

## 2023-10-03 VITALS — BP 152/88 | HR 99 | Temp 98.4°F | Resp 16 | Ht 61.0 in | Wt 155.0 lb

## 2023-10-03 DIAGNOSIS — Z01812 Encounter for preprocedural laboratory examination: Secondary | ICD-10-CM | POA: Diagnosis not present

## 2023-10-03 DIAGNOSIS — I10 Essential (primary) hypertension: Secondary | ICD-10-CM | POA: Insufficient documentation

## 2023-10-03 HISTORY — DX: Prediabetes: R73.03

## 2023-10-03 HISTORY — DX: Family history of other specified conditions: Z84.89

## 2023-10-03 LAB — CBC
HCT: 41.1 % (ref 36.0–46.0)
Hemoglobin: 12 g/dL (ref 12.0–15.0)
MCH: 26.8 pg (ref 26.0–34.0)
MCHC: 29.2 g/dL — ABNORMAL LOW (ref 30.0–36.0)
MCV: 91.9 fL (ref 80.0–100.0)
Platelets: 289 10*3/uL (ref 150–400)
RBC: 4.47 MIL/uL (ref 3.87–5.11)
RDW: 18.3 % — ABNORMAL HIGH (ref 11.5–15.5)
WBC: 10.5 10*3/uL (ref 4.0–10.5)
nRBC: 0 % (ref 0.0–0.2)

## 2023-10-03 LAB — BASIC METABOLIC PANEL
Anion gap: 7 (ref 5–15)
BUN: 23 mg/dL (ref 8–23)
CO2: 24 mmol/L (ref 22–32)
Calcium: 10.9 mg/dL — ABNORMAL HIGH (ref 8.9–10.3)
Chloride: 108 mmol/L (ref 98–111)
Creatinine, Ser: 1.24 mg/dL — ABNORMAL HIGH (ref 0.44–1.00)
GFR, Estimated: 43 mL/min — ABNORMAL LOW (ref >60)
Glucose, Bld: 103 mg/dL — ABNORMAL HIGH (ref 70–99)
Potassium: 5.1 mmol/L (ref 3.5–5.1)
Sodium: 139 mmol/L (ref 135–145)

## 2023-10-04 NOTE — Progress Notes (Signed)
Anesthesia Chart Review   Case: [id number] Date/Time: 10/21/23 0915   Procedure: LEFT INFERIOR PARATHYROIDECTOMY (Left) - 2ND SCRUB PERSON SPECIAL REQUESTS: FROZEN SECTION   Anesthesia type: General   Pre-op diagnosis: primary hyperparathyroidism   Location: [facility] ROOM 01 / [facility]   Surgeons: Darnell Level, MD       DISCUSSION:82 y.o. former smoker with h/o HTN, COPD, CKD, primary hyperparathyroidism scheduled for above procedure 10/21/2023 with Dr. Darnell Level.   Per pulmonology preoperative evaluation 09/10/2023, "ARISCAT Score for Postoperative Pulmonary Complications Predicts risk of pulmonary complications after surgery, including respiratory failure.   Intermediate risk 13.3% risk of in-hospital post-op pulmonary complications (composite including respiratory failure, respiratory infection, pleural effusion, atelectasis, pneumothorax, bronchospasm, aspiration pneumonitis)"  VS: There were no vitals taken for this visit.  PROVIDERS: Pincus Sanes, MD is PCP    LABS: Labs reviewed: Acceptable for surgery. (all labs ordered are listed, but only abnormal results are displayed)  Labs Reviewed - No data to display   IMAGES:   EKG:   CV: Myocardial Perfusion 12/23/2021   The study is normal. The study is low risk.   No ST deviation was noted.   LV perfusion is normal. There is no evidence of ischemia. There is no evidence of infarction.   Left ventricular function is normal. End diastolic cavity size is normal. End systolic cavity size is normal.   Prior study not available for comparison.   Breast attenuation No ischemia or infarct  Estimated EF 76%    Echo 11/26/2021 1. Left ventricular ejection fraction, by estimation, is 60 to 65%. The  left ventricle has normal function. The left ventricle has no regional  wall motion abnormalities. Left ventricular diastolic parameters are  consistent with Grade I diastolic  dysfunction (impaired relaxation).   2. Right  ventricular systolic function is normal. The right ventricular  size is normal. There is normal pulmonary artery systolic pressure.   3. Left atrial size was moderately dilated.   4. The mitral valve is grossly normal. No evidence of mitral valve  regurgitation.   5. The aortic valve is grossly normal. Aortic valve regurgitation is not  visualized.   6. The inferior vena cava is normal in size with greater than 50%  respiratory variability, suggesting right atrial pressure of 3 mmHg.  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   removed right upper lip/perinostril [place] ([facility])   Chronic sinusitis    follows with ENT for same   CKD (chronic kidney disease) 2018   COPD (chronic obstructive pulmonary disease) (HCC) dx 2013   GOLD 1, follows with pulm for same   Episodic tension type headache    EXOGENOUS OBESITY    Family history of adverse reaction to anesthesia    sister PONV   GERD    Heart murmur    as a baby   HYPERLIPIDEMIA    Hypertension    Migraines 07/10/2012   "over; last one was @ age 5"   OSTEOPENIA    Pneumonia 2018-2019   x 2   Pre-diabetes     Past Surgical History:  Procedure Laterality Date   APPENDECTOMY  07/10/2012   BREAST CYST ASPIRATION  ?1980's   right   EYE SURGERY     both eyes   KNEE ARTHROSCOPY  1970's or 1980's   right; torn  cartilage   LAPAROSCOPIC APPENDECTOMY  07/10/2012   Procedure: APPENDECTOMY LAPAROSCOPIC;  Surgeon: Shelly Rubenstein, MD;  Location: [facility];  Service: General;  Laterality: N/A;   MOHS SURGERY  01/2016   nose   MOLE REMOVAL  1957   "my back"   SKIN CANCER EXCISION     "2 on my back; 2 on my face"   TONSILLECTOMY AND ADENOIDECTOMY  1946   WISDOM TOOTH EXTRACTION  1959    MEDICATIONS: No current facility-administered medications for this encounter.    albuterol (VENTOLIN HFA) 108 (90 Base) MCG/ACT  inhaler   amLODipine (NORVASC) 5 MG tablet   aspirin-acetaminophen-caffeine (EXCEDRIN MIGRAINE) 250-250-65 MG tablet   atorvastatin (LIPITOR) 20 MG tablet   Cranberry 500 MG CAPS   cycloSPORINE (RESTASIS) 0.05 % ophthalmic emulsion   denosumab (PROLIA) 60 MG/ML SOSY injection   Erenumab-aooe (AIMOVIG) 70 MG/ML SOAJ   esomeprazole (NEXIUM) 40 MG capsule   famotidine (PEPCID) 40 MG tablet   Flaxseed, Linseed, (FLAXSEED OIL PO)   latanoprost (XALATAN) 0.005 % ophthalmic solution   nadolol (CORGARD) 40 MG tablet   TRELEGY ELLIPTA 100-62.5-25 MCG/ACT AEPB   Wheat Dextrin (BENEFIBER) POWD   Multiple Vitamins-Minerals (OCUVITE PO)     Marajade Lei Ward, PA-C [facility] Pre-Surgical Testing [phone]

## 2023-10-10 ENCOUNTER — Encounter (HOSPITAL_COMMUNITY): Payer: Self-pay | Admitting: Surgery

## 2023-10-10 NOTE — H&P (Signed)
REFERRING PHYSICIAN: Maxie Barb, MD  PROVIDER: Nihira Puello Myra Rude, MD   Chief Complaint: New Consultation (Primary hyperparathyroidism)  History of Present Illness:  Patient is referred by her nephrologist, Dr. Crista Elliot, for surgical evaluation and management of newly diagnosed primary hyperparathyroidism. Patient had been noted on routine laboratory testing to have elevated serum calcium levels. Her most recent level was at the upper range of normal at 10.2. Intact PTH level was elevated at 117. Patient underwent nuclear medicine parathyroid scan with sestamibi on June 10, 2023. 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 Patient is now referred to surgery for consideration for parathyroidectomy for management. Patient is accompanied by her sister. There is a history of parathyroid disease with surgery in both her sister and their mother. Patient has not had any head or neck surgery. She does note significant fatigue. She complains of neck pain and bone and joint discomfort. She denies any history of nephrolithiasis. She does have urinary frequency. She has had a bone density scan showing osteoporosis.  Review of Systems: A complete review of systems was obtained from the patient. I have reviewed this information and discussed as appropriate with the patient. See HPI as well for other ROS.  Review of Systems  Constitutional: Positive for malaise/fatigue.  HENT: Negative.  Eyes: Negative.  Respiratory: Negative.  Cardiovascular: Negative.  Gastrointestinal: Negative.  Genitourinary: Positive for frequency.  Musculoskeletal: Positive for joint pain and neck pain.  Skin: Negative.  Neurological: Negative.  Endo/Heme/Allergies: Negative.  Psychiatric/Behavioral: Negative.    Medical History: Past Medical History:  Diagnosis Date  Arthritis  Chronic kidney disease  COPD (chronic obstructive pulmonary disease)  (CMS/HHS-HCC)  GERD (gastroesophageal reflux disease)  History of cancer  Hyperlipidemia  Hypertension   Patient Active Problem List  Diagnosis  Primary hyperparathyroidism (CMS/HHS-HCC)   Past Surgical History:  Procedure Laterality Date  APPENDECTOMY  CATARACT EXTRACTION  Knee surgery  TONSILLECTOMY AND ADENOIDECTOMY    Allergies  Allergen Reactions  Fexofenadine Hcl Other (See Comments)  Headache, "took the pill; I got flu symptoms"  Adhesive Tape-Silicones Itching and Other (See Comments)  Band-Aids = Skin becomes red and itchy  Omeprazole Other (See Comments)  "Fecal incontinence"  Ranitidine Other (See Comments)  "Fecal incontinence"   Current Outpatient Medications on File Prior to Visit  Medication Sig Dispense Refill  AIMOVIG AUTOINJECTOR 70 mg/mL AtIn INJECT 70MG  INTO THE SKIN EVERY 30 DAYS  albuterol MDI, PROVENTIL, VENTOLIN, PROAIR, HFA 90 mcg/actuation inhaler Inhale 2 Inhalations into the lungs every 6 (six) hours as needed  amLODIPine (NORVASC) 5 MG tablet Take 1 tablet by mouth once daily  atorvastatin (LIPITOR) 20 MG tablet Take 1 tablet by mouth once daily  cranberry 500 mg Cap Take by mouth  denosumab (PROLIA) 60 mg/mL inj syringe Inject subcutaneously  esomeprazole (NEXIUM) 40 MG DR capsule TAKE 1 CAPSULE BY MOUTH DAILY AT 12 NOON.  famotidine (PEPCID) 40 MG tablet Take 40 mg by mouth at bedtime  latanoprost (XALATAN) 0.005 % ophthalmic solution Apply 1 drop to eye at bedtime  RESTASIS 0.05 % ophthalmic emulsion Place 1 drop into both eyes 2 (two) times daily  TRELEGY ELLIPTA 100-62.5-25 mcg inhaler Inhale 1 Puff into the lungs once daily   No current facility-administered medications on file prior to visit.   Family History  Problem Relation Age of Onset  Skin cancer Mother  Diabetes Father    Social History   Tobacco Use  Smoking Status Former  Types: Cigarettes  Start date: 1970  Smokeless Tobacco Never    Social History    Socioeconomic History  Marital status: Single  Tobacco Use  Smoking status: Former  Types: Cigarettes  Start date: 1970  Smokeless tobacco: Never  Substance and Sexual Activity  Alcohol use: Yes  Drug use: Never   Social Drivers of Corporate investment banker Strain: Low Risk (07/26/2023)  Received from [facility]  Overall Financial Resource Strain (CARDIA)  Difficulty of Paying Living Expenses: Not hard at all  Food Insecurity: No Food Insecurity (07/26/2023)  Received from [facility]  Hunger Vital Sign  Worried About Running Out of Food in the Last Year: Never true  Ran Out of Food in the Last Year: Never true  Transportation Needs: No Transportation Needs (07/26/2023)  Received from [facility] - Transportation  Lack of Transportation (Medical): No  Lack of Transportation (Non-Medical): No  Physical Activity: Inactive (07/26/2023)  Received from [facility]  Exercise Vital Sign  Days of Exercise per Week: 0 days  Minutes of Exercise per Session: 0 min  Stress: No Stress Concern Present (07/26/2023)  Received from [facility] - Occupational Stress Questionnaire  Feeling of Stress : Not at all  Social Connections: Moderately Isolated (07/26/2023)  [facility]  Social Connection and Isolation Panel [NHANES]  Frequency of Communication with Friends and Family: Once a week  Frequency of Social Gatherings with Friends and Family: Once a week  Attends Religious Services: More than 4 times per year  Active Member of [employer] or Organizations: Yes  Attends Banker Meetings: 1 to 4 times per year  Marital Status: Never married   Objective:   Vitals:  BP: (!) 140/78  Pulse: 74  Temp: 36.7 C (98.1 F)  SpO2: 92%  Weight: 71 kg (156 lb 9.6 oz)  Height: 157.5 cm (5\' 2" )  PainSc: 0-No pain   Body mass index is 28.64 kg/m.  Physical Exam   GENERAL APPEARANCE Comfortable, no acute  issues Development: normal Gross deformities: none  SKIN Rash, lesions, ulcers: none Induration, erythema: none Nodules: none palpable  EYES Conjunctiva and lids: normal Pupils: equal and reactive  EARS, NOSE, MOUTH, THROAT External ears: no lesion or deformity External nose: no lesion or deformity Hearing: grossly normal  NECK Symmetric: yes Trachea: midline Thyroid: no palpable nodules in the thyroid bed  ABDOMEN Not assessed  GENITOURINARY/RECTAL Not assessed  MUSCULOSKELETAL Station and gait: normal Digits and nails: no clubbing or cyanosis Muscle strength: grossly normal all extremities Range of motion: grossly normal all extremities Deformity: none  LYMPHATIC Cervical: none palpable Supraclavicular: none palpable  PSYCHIATRIC Oriented to person, place, and time: yes Mood and affect: normal for situation Judgment and insight: appropriate for situation   Assessment and Plan:   Primary hyperparathyroidism (CMS/HHS-HCC)  Patient is referred by her nephrologist for surgical evaluation and recommendations regarding newly diagnosed primary hyperparathyroidism.  Patient provided with a copy of "Parathyroid Surgery: Treatment for Your Parathyroid Gland Problem", published by [employer], 12 pages. Book reviewed and explained to patient during visit today.  Today we reviewed her clinical history. We reviewed her symptoms. We reviewed the recent laboratory studies as well as her recent imaging study. 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 I would like to obtain an ultrasound examination of the neck to evaluate the thyroid and to possibly confirm the location of the parathyroid adenoma. We will make arrangements for  the study in the near future.  Today we discussed minimally invasive parathyroid surgery. We discussed doing this as an outpatient procedure. We discussed the size and location of the surgical incision. We discussed the risk and benefits  of surgery including the risk of recurrent laryngeal nerve injury. We discussed the postoperative course to be expected. The patient and her sister understand and wish to proceed.  Patient will undergo ultrasound exam. We will contact her with those results when they are available and make plans for further management at that time.   Darnell Level, MD [facility] A [facility] practice Office: [phone]

## 2023-10-21 ENCOUNTER — Other Ambulatory Visit: Payer: Self-pay

## 2023-10-21 ENCOUNTER — Encounter (HOSPITAL_COMMUNITY): Payer: Self-pay | Admitting: Surgery

## 2023-10-21 ENCOUNTER — Encounter (HOSPITAL_COMMUNITY)
Admission: RE | Disposition: A | Discharge status: Home / Self Care | Payer: Self-pay | Source: Home / Self Care | Attending: Surgery

## 2023-10-21 ENCOUNTER — Ambulatory Visit (HOSPITAL_COMMUNITY): Payer: Medicare PPO | Admitting: Physician Assistant

## 2023-10-21 ENCOUNTER — Ambulatory Visit (HOSPITAL_BASED_OUTPATIENT_CLINIC_OR_DEPARTMENT_OTHER): Payer: Medicare PPO | Admitting: Physician Assistant

## 2023-10-21 ENCOUNTER — Ambulatory Visit (HOSPITAL_COMMUNITY)
Admission: RE | Admit: 2023-10-21 | Discharge: 2023-10-21 | Disposition: A | Discharge status: Home / Self Care | Payer: Medicare PPO | Attending: Surgery | Admitting: Surgery

## 2023-10-21 DIAGNOSIS — K219 Gastro-esophageal reflux disease without esophagitis: Secondary | ICD-10-CM | POA: Insufficient documentation

## 2023-10-21 DIAGNOSIS — M810 Age-related osteoporosis without current pathological fracture: Secondary | ICD-10-CM | POA: Insufficient documentation

## 2023-10-21 DIAGNOSIS — J449 Chronic obstructive pulmonary disease, unspecified: Secondary | ICD-10-CM | POA: Insufficient documentation

## 2023-10-21 DIAGNOSIS — Z79899 Other long term (current) drug therapy: Secondary | ICD-10-CM | POA: Diagnosis not present

## 2023-10-21 DIAGNOSIS — I129 Hypertensive chronic kidney disease with stage 1 through stage 4 chronic kidney disease, or unspecified chronic kidney disease: Secondary | ICD-10-CM | POA: Diagnosis not present

## 2023-10-21 DIAGNOSIS — E210 Primary hyperparathyroidism: Secondary | ICD-10-CM

## 2023-10-21 DIAGNOSIS — N189 Chronic kidney disease, unspecified: Secondary | ICD-10-CM | POA: Diagnosis not present

## 2023-10-21 DIAGNOSIS — D351 Benign neoplasm of parathyroid gland: Secondary | ICD-10-CM | POA: Insufficient documentation

## 2023-10-21 DIAGNOSIS — Z7951 Long term (current) use of inhaled steroids: Secondary | ICD-10-CM | POA: Insufficient documentation

## 2023-10-21 HISTORY — PX: PARATHYROIDECTOMY: SHX19

## 2023-10-21 SURGERY — PARATHYROIDECTOMY
Anesthesia: General | Laterality: Left

## 2023-10-21 MED ORDER — ROCURONIUM BROMIDE 10 MG/ML (PF) SYRINGE
PREFILLED_SYRINGE | INTRAVENOUS | Status: DC | PRN
  Administered 2023-10-21: 40 mg via INTRAVENOUS

## 2023-10-21 MED ORDER — LIDOCAINE 2% (20 MG/ML) 5 ML SYRINGE
INTRAMUSCULAR | Status: DC | PRN
  Administered 2023-10-21: 40 mg via INTRAVENOUS

## 2023-10-21 MED ORDER — CHLORHEXIDINE GLUCONATE CLOTH 2 % EX PADS: 6.0000 | MEDICATED_PAD | Freq: Once | CUTANEOUS | Status: DC

## 2023-10-21 MED ORDER — PROPOFOL 10 MG/ML IV BOLUS
INTRAVENOUS | Status: DC | PRN
  Administered 2023-10-21: 80 mg via INTRAVENOUS

## 2023-10-21 MED ORDER — ORAL CARE MOUTH RINSE: 15.0000 mL | Freq: Once | OROMUCOSAL | Status: AC

## 2023-10-21 MED ORDER — PHENYLEPHRINE HCL-NACL 20-0.9 MG/250ML-% IV SOLN
INTRAVENOUS | Status: DC | PRN
  Administered 2023-10-21: 25 ug/min via INTRAVENOUS

## 2023-10-21 MED ORDER — CHLORHEXIDINE GLUCONATE 0.12 % MT SOLN
15.0000 mL | Freq: Once | OROMUCOSAL | Status: AC
Start: 2023-10-21 — End: 2023-10-21
  Administered 2023-10-21: 15 mL via OROMUCOSAL

## 2023-10-21 MED ORDER — DEXAMETHASONE SODIUM PHOSPHATE 10 MG/ML IJ SOLN
INTRAMUSCULAR | Status: DC | PRN
  Administered 2023-10-21: 8 mg via INTRAVENOUS

## 2023-10-21 MED ORDER — TRAMADOL HCL 50 MG PO TABS: 50.0000 mg | ORAL_TABLET | Freq: Four times a day (QID) | ORAL | 0 refills | Status: DC | PRN

## 2023-10-21 MED ORDER — ONDANSETRON HCL 4 MG/2ML IJ SOLN
INTRAMUSCULAR | Status: DC | PRN
  Administered 2023-10-21: 4 mg via INTRAVENOUS

## 2023-10-21 MED ORDER — CEFAZOLIN SODIUM-DEXTROSE 2-4 GM/100ML-% IV SOLN
2.0000 g | INTRAVENOUS | Status: AC
  Administered 2023-10-21: 2 g via INTRAVENOUS
  Filled 2023-10-21: qty 100

## 2023-10-21 MED ORDER — PROPOFOL 10 MG/ML IV BOLUS
INTRAVENOUS | Status: AC
  Filled 2023-10-21: qty 20

## 2023-10-21 MED ORDER — DEXAMETHASONE SODIUM PHOSPHATE 10 MG/ML IJ SOLN
INTRAMUSCULAR | Status: AC
  Filled 2023-10-21: qty 1

## 2023-10-21 MED ORDER — BUPIVACAINE HCL 0.25 % IJ SOLN
INTRAMUSCULAR | Status: DC | PRN
  Administered 2023-10-21: 10 mL

## 2023-10-21 MED ORDER — EPHEDRINE SULFATE-NACL 50-0.9 MG/10ML-% IV SOSY
PREFILLED_SYRINGE | INTRAVENOUS | Status: DC | PRN
  Administered 2023-10-21: 5 mg via INTRAVENOUS

## 2023-10-21 MED ORDER — ONDANSETRON HCL 4 MG/2ML IJ SOLN
INTRAMUSCULAR | Status: AC
  Filled 2023-10-21: qty 2

## 2023-10-21 MED ORDER — LACTATED RINGERS IV SOLN: INTRAVENOUS | Status: DC

## 2023-10-21 MED ORDER — FENTANYL CITRATE (PF) 100 MCG/2ML IJ SOLN
INTRAMUSCULAR | Status: DC | PRN
  Administered 2023-10-21: 25 ug via INTRAVENOUS
  Administered 2023-10-21: 50 ug via INTRAVENOUS

## 2023-10-21 MED ORDER — ROCURONIUM BROMIDE 10 MG/ML (PF) SYRINGE
PREFILLED_SYRINGE | INTRAVENOUS | Status: AC
  Filled 2023-10-21: qty 10

## 2023-10-21 MED ORDER — LIDOCAINE HCL (PF) 2 % IJ SOLN
INTRAMUSCULAR | Status: AC
  Filled 2023-10-21: qty 5

## 2023-10-21 MED ORDER — ACETAMINOPHEN 10 MG/ML IV SOLN
1000.0000 mg | Freq: Once | INTRAVENOUS | Status: DC | PRN
  Administered 2023-10-21: 1000 mg via INTRAVENOUS

## 2023-10-21 MED ORDER — HEMOSTATIC AGENTS (NO CHARGE) OPTIME
TOPICAL | Status: DC | PRN
  Administered 2023-10-21: 1 via TOPICAL

## 2023-10-21 MED ORDER — 0.9 % SODIUM CHLORIDE (POUR BTL) OPTIME
TOPICAL | Status: DC | PRN
  Administered 2023-10-21: 1000 mL

## 2023-10-21 MED ORDER — SUGAMMADEX SODIUM 200 MG/2ML IV SOLN
INTRAVENOUS | Status: DC | PRN
  Administered 2023-10-21: 200 mg via INTRAVENOUS

## 2023-10-21 MED ORDER — BUPIVACAINE HCL (PF) 0.25 % IJ SOLN
INTRAMUSCULAR | Status: AC
  Filled 2023-10-21: qty 30

## 2023-10-21 MED ORDER — FENTANYL CITRATE PF 50 MCG/ML IJ SOSY
25.0000 ug | PREFILLED_SYRINGE | INTRAMUSCULAR | Status: DC | PRN
  Administered 2023-10-21: 25 ug via INTRAVENOUS

## 2023-10-21 MED ORDER — ACETAMINOPHEN 10 MG/ML IV SOLN
INTRAVENOUS | Status: AC
  Filled 2023-10-21: qty 100

## 2023-10-21 MED ORDER — FENTANYL CITRATE (PF) 100 MCG/2ML IJ SOLN
INTRAMUSCULAR | Status: AC
  Filled 2023-10-21: qty 2

## 2023-10-21 MED ORDER — FENTANYL CITRATE PF 50 MCG/ML IJ SOSY
PREFILLED_SYRINGE | INTRAMUSCULAR | Status: AC
  Filled 2023-10-21: qty 1

## 2023-10-21 SURGICAL SUPPLY — 30 items
ATTRACTOMAT 16X20 MAGNETIC DRP (DRAPES) ×1 IMPLANT
BAG COUNTER SPONGE SURGICOUNT (BAG) ×1 IMPLANT
BLADE SURG 15 STRL LF DISP TIS (BLADE) ×1 IMPLANT
CHLORAPREP W/TINT 26 (MISCELLANEOUS) ×1 IMPLANT
CLIP TI MEDIUM 6 (CLIP) ×2 IMPLANT
CLIP TI WIDE RED SMALL 6 (CLIP) ×2 IMPLANT
COVER SURGICAL LIGHT HANDLE (MISCELLANEOUS) ×1 IMPLANT
DERMABOND ADVANCED .7 DNX12 (GAUZE/BANDAGES/DRESSINGS) ×1 IMPLANT
DRAPE LAPAROTOMY T 98X78 PEDS (DRAPES) ×1 IMPLANT
DRAPE UTILITY XL STRL (DRAPES) ×1 IMPLANT
ELECT REM PT RETURN 15FT ADLT (MISCELLANEOUS) ×1 IMPLANT
GAUZE 4X4 16PLY ~~LOC~~+RFID DBL (SPONGE) ×1 IMPLANT
GLOVE SURG ORTHO 8.0 STRL STRW (GLOVE) ×1 IMPLANT
GOWN STRL REUS W/ TWL XL LVL3 (GOWN DISPOSABLE) ×3 IMPLANT
HEMOSTAT SURGICEL 2X4 FIBR (HEMOSTASIS) ×1 IMPLANT
ILLUMINATOR WAVEGUIDE N/F (MISCELLANEOUS) IMPLANT
KIT BASIN OR (CUSTOM PROCEDURE TRAY) ×1 IMPLANT
KIT TURNOVER KIT A (KITS) IMPLANT
NDL HYPO 22X1.5 SAFETY MO (MISCELLANEOUS) ×1 IMPLANT
NEEDLE HYPO 22X1.5 SAFETY MO (MISCELLANEOUS) ×1
PACK BASIC VI WITH GOWN DISP (CUSTOM PROCEDURE TRAY) ×1 IMPLANT
PENCIL SMOKE EVACUATOR (MISCELLANEOUS) ×1 IMPLANT
SHEARS HARMONIC 9CM CVD (BLADE) IMPLANT
SUT MNCRL AB 4-0 PS2 18 (SUTURE) ×1 IMPLANT
SUT VIC AB 3-0 SH 18 (SUTURE) ×1 IMPLANT
SYR BULB IRRIG 60ML STRL (SYRINGE) ×1 IMPLANT
SYR CONTROL 10ML LL (SYRINGE) ×1 IMPLANT
TOWEL OR 17X26 10 PK STRL BLUE (TOWEL DISPOSABLE) ×1 IMPLANT
TOWEL OR NON WOVEN STRL DISP B (DISPOSABLE) ×1 IMPLANT
TUBING CONNECTING 10 (TUBING) ×1 IMPLANT

## 2023-10-21 NOTE — Anesthesia Preprocedure Evaluation (Addendum)
Anesthesia Evaluation  Patient identified by MRN, date of birth, ID band Patient awake    Reviewed: Allergy & Precautions, NPO status , Patient's Chart, lab work & pertinent test results  Airway Mallampati: II  TM Distance: >3 FB Neck ROM: Full    Dental no notable dental hx.    Pulmonary COPD,  COPD inhaler, former smoker   Pulmonary exam normal        Cardiovascular hypertension, Pt. on medications and Pt. on home beta blockers  Rhythm:Regular Rate:Normal     Neuro/Psych  Headaches  negative psych ROS   GI/Hepatic Neg liver ROS,GERD  Medicated,,  Endo/Other  negative endocrine ROS    Renal/GU CRFRenal disease  negative genitourinary   Musculoskeletal  (+) Arthritis , Osteoarthritis,    Abdominal Normal abdominal exam  (+)   Peds  Hematology  (+) Blood dyscrasia, anemia Lab Results      Component                Value               Date                      WBC                      10.5                10/03/2023                HGB                      12.0                10/03/2023                HCT                      41.1                10/03/2023                MCV                      91.9                10/03/2023                PLT                      289                 10/03/2023             Lab Results      Component                Value               Date                      NA                       139                 10/03/2023                K  5.1                 10/03/2023                CO2                      24                  10/03/2023                GLUCOSE                  103 (H)             10/03/2023                BUN                      23                  10/03/2023                CREATININE               1.24 (H)            10/03/2023                CALCIUM                  10.9 (H)            10/03/2023                GFR                      48.27 (L)            07/27/2023                GFRNONAA                 43 (L)              10/03/2023              Anesthesia Other Findings   Reproductive/Obstetrics                             Anesthesia Physical Anesthesia Plan  ASA: 3  Anesthesia Plan: General   Post-op Pain Management:    Induction: Intravenous  PONV Risk Score and Plan: 3 and Ondansetron, Dexamethasone and Treatment may vary due to age or medical condition  Airway Management Planned: Mask and Oral ETT  Additional Equipment: None  Intra-op Plan:   Post-operative Plan: Extubation in OR  Informed Consent: I have reviewed the patients History and Physical, chart, labs and discussed the procedure including the risks, benefits and alternatives for the proposed anesthesia with the patient or authorized representative who has indicated his/her understanding and acceptance.     Dental advisory given  Plan Discussed with: CRNA  Anesthesia Plan Comments:        Anesthesia Quick Evaluation

## 2023-10-21 NOTE — Op Note (Signed)
OPERATIVE REPORT - PARATHYROIDECTOMY  Preoperative diagnosis: Primary hyperparathyroidism  Postop diagnosis: Same  Procedure: Left superior and inferior minimally invasive parathyroidectomy  Surgeon:  Darnell Level, MD  Anesthesia: General endotracheal  Estimated blood loss: Minimal  Preparation: ChloraPrep  Indications: Patient is referred by her nephrologist, Dr. Crista Elliot, for surgical evaluation and management of newly diagnosed primary hyperparathyroidism. Patient had been noted on routine laboratory testing to have elevated serum calcium levels. Her most recent level was at the upper range of normal at 10.2. Intact PTH level was elevated at 117. Patient underwent nuclear medicine parathyroid scan with sestamibi on June 10, 2023. 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  Ultrasound of the neck was performed and confirmed an enlarged parathyroid gland at the inferior pole of the thyroid.  Patient now comes to surgery for neck exploration and parathyroidectomy.  Procedure: The patient was prepared in the pre-operative holding area. The patient was brought to the operating room and placed in a supine position on the operating room table. Following administration of general anesthesia, the patient was positioned and then prepped and draped in the usual strict aseptic fashion. After ascertaining that an adequate level of anesthesia been achieved, a neck incision was made with a #15 blade. Dissection was carried through subcutaneous tissues and platysma. Hemostasis was obtained with the electrocautery. Skin flaps were developed circumferentially and a Weitlander retractor was placed for exposure.  Strap muscles were incised in the midline. Strap muscles were reflected laterally exposing the thyroid lobe. With gentle blunt dissection the thyroid lobe was mobilized.  Dissection was carried posteriorly and an enlarged parathyroid gland was identified at  the inferior pole of the thyroid gland.. It was gently mobilized. Vascular structures were divided between small ligaclips. Care was taken to avoid the recurrent laryngeal nerve. The parathyroid gland was completely excised.  It measured approximately 1 cm in greatest dimension as described on the ultrasound.  It was submitted to pathology where frozen section confirmed hypercellular parathyroid tissue consistent with adenoma.  Further dissection on the left side revealed a enlarged left superior parathyroid gland.  This gland appears to measure more than 2 cm in dimension.  It is gently dissected out.  Vascular structures are divided between small ligaclips and the gland is excised.  Frozen section biopsy confirms hypercellular parathyroid tissue consistent with adenoma.  Neck was irrigated with warm saline and good hemostasis was noted. Fibrillar was placed in the operative field. Strap muscles were approximated in the midline with interrupted 3-0 Vicryl sutures. Platysma was closed with interrupted 3-0 Vicryl sutures. Marcaine was infiltrated circumferentially. Skin was closed with a running 4-0 Monocryl subcuticular suture. Wound was washed and dried and Dermabond was applied. Patient was awakened from anesthesia and brought to the recovery room. The patient tolerated the procedure well.   Darnell Level, MD [facility] Surgery Office: [phone]

## 2023-10-21 NOTE — Interval H&P Note (Signed)
History and Physical Interval Note:  10/21/2023 8:43 AM  Joy Martin  has presented today for surgery, with the diagnosis of primary hyperparathyroidism.  The various methods of treatment have been discussed with the patient and family. After consideration of risks, benefits and other options for treatment, the patient has consented to    Procedure(s) with comments: LEFT INFERIOR PARATHYROIDECTOMY (Left) - 2ND SCRUB PERSON SPECIAL REQUESTS: FROZEN SECTION as a surgical intervention.    The patient's history has been reviewed, patient examined, no change in status, stable for surgery.  I have reviewed the patient's chart and labs.  Questions were answered to the patient's satisfaction.    Darnell Level, MD [facility] A [facility] practice Office: [phone]   Darnell Level

## 2023-10-21 NOTE — Anesthesia Procedure Notes (Addendum)
Procedure Name: Intubation Date/Time: 10/21/2023 9:19 AM  Performed by: Elisabeth Cara, CRNAPre-anesthesia Checklist: Emergency Drugs available, Patient identified, Suction available, Patient being monitored and Timeout performed Patient Re-evaluated:Patient Re-evaluated prior to induction Oxygen Delivery Method: Circle system utilized Preoxygenation: Pre-oxygenation with 100% oxygen Induction Type: IV induction Ventilation: Mask ventilation without difficulty Laryngoscope Size: Mac and 4 Grade View: Grade I Tube type: Oral Tube size: 7.5 mm Number of attempts: 1 Airway Equipment and Method: Stylet Placement Confirmation: ETT inserted through vocal cords under direct vision, positive ETCO2 and breath sounds checked- equal and bilateral Secured at: 21 cm Tube secured with: Tape Dental Injury: Teeth and Oropharynx as per pre-operative assessment

## 2023-10-21 NOTE — Discharge Instructions (Addendum)

## 2023-10-21 NOTE — Transfer of Care (Signed)
Immediate Anesthesia Transfer of Care Note  Patient: Joy Martin  Procedure(s) Performed: LEFT  SUPERIOR AND INFERIOR PARATHYROIDECTOMY (Left)  Patient Location: PACU  Anesthesia Type:General  Level of Consciousness: awake, alert , oriented, and patient cooperative  Airway & Oxygen Therapy: Patient Spontanous Breathing and Patient connected to face mask oxygen  Post-op Assessment: Report given to RN, Post -op Vital signs reviewed and stable, and Patient moving all extremities  Post vital signs: Reviewed and stable  Last Vitals:  Vitals Value Taken Time  BP 175/85 10/21/23 1018  Temp    Pulse 59 10/21/23 1020  Resp 15 10/21/23 1021  SpO2 100 % 10/21/23 1020  Vitals shown include unfiled device data.  Last Pain:  Vitals:   10/21/23 0735  TempSrc: Oral  PainSc: 0-No pain         Complications: No notable events documented.

## 2023-10-22 NOTE — Anesthesia Postprocedure Evaluation (Signed)
Anesthesia Post Note  Patient: Joy Martin  Procedure(s) Performed: LEFT  SUPERIOR AND INFERIOR PARATHYROIDECTOMY (Left)     Patient location during evaluation: PACU Anesthesia Type: General Level of consciousness: awake and alert Pain management: pain level controlled Vital Signs Assessment: post-procedure vital signs reviewed and stable Respiratory status: spontaneous breathing, nonlabored ventilation, respiratory function stable and patient connected to nasal cannula oxygen Cardiovascular status: blood pressure returned to baseline and stable Postop Assessment: no apparent nausea or vomiting Anesthetic complications: no   No notable events documented.  Last Vitals:  Vitals:   10/21/23 1100 10/21/23 1115  BP: 139/65 136/83  Pulse: (!) 58 (!) 58  Resp: 14   Temp: (!) 36.4 C   SpO2: 93% 93%    Last Pain:  Vitals:   10/21/23 1115  TempSrc:   PainSc: 3                  [name] P Marlie Kuennen

## 2023-10-23 ENCOUNTER — Encounter (HOSPITAL_COMMUNITY): Payer: Self-pay | Admitting: Surgery

## 2023-10-24 LAB — SURGICAL PATHOLOGY

## 2023-11-10 DIAGNOSIS — E210 Primary hyperparathyroidism: Secondary | ICD-10-CM | POA: Diagnosis not present

## 2023-11-10 DIAGNOSIS — Z9889 Other specified postprocedural states: Secondary | ICD-10-CM | POA: Diagnosis not present

## 2023-11-10 DIAGNOSIS — Z9089 Acquired absence of other organs: Secondary | ICD-10-CM | POA: Diagnosis not present

## 2023-11-21 ENCOUNTER — Ambulatory Visit (INDEPENDENT_AMBULATORY_CARE_PROVIDER_SITE_OTHER)
Admission: RE | Admit: 2023-11-21 | Discharge: 2023-11-21 | Disposition: A | Discharge status: Home / Self Care | Payer: Medicare PPO | Source: Ambulatory Visit | Attending: Internal Medicine

## 2023-11-21 DIAGNOSIS — Z13820 Encounter for screening for osteoporosis: Secondary | ICD-10-CM

## 2023-11-27 ENCOUNTER — Encounter: Payer: Self-pay | Admitting: Internal Medicine

## 2023-12-09 ENCOUNTER — Other Ambulatory Visit: Payer: Self-pay | Admitting: Internal Medicine

## 2023-12-16 ENCOUNTER — Other Ambulatory Visit: Payer: Self-pay | Admitting: Internal Medicine

## 2023-12-27 ENCOUNTER — Encounter (HOSPITAL_COMMUNITY): Payer: Self-pay | Admitting: Surgery

## 2023-12-28 ENCOUNTER — Other Ambulatory Visit: Payer: Self-pay | Admitting: Internal Medicine

## 2024-01-10 DIAGNOSIS — Z1231 Encounter for screening mammogram for malignant neoplasm of breast: Secondary | ICD-10-CM | POA: Diagnosis not present

## 2024-01-10 LAB — HM MAMMOGRAPHY

## 2024-01-11 ENCOUNTER — Encounter: Payer: Self-pay | Admitting: Internal Medicine

## 2024-01-24 ENCOUNTER — Encounter: Payer: BC Managed Care – PPO | Admitting: Internal Medicine

## 2024-01-24 DIAGNOSIS — L570 Actinic keratosis: Secondary | ICD-10-CM | POA: Diagnosis not present

## 2024-01-24 DIAGNOSIS — Z85828 Personal history of other malignant neoplasm of skin: Secondary | ICD-10-CM | POA: Diagnosis not present

## 2024-01-24 DIAGNOSIS — L821 Other seborrheic keratosis: Secondary | ICD-10-CM | POA: Diagnosis not present

## 2024-01-24 DIAGNOSIS — D485 Neoplasm of uncertain behavior of skin: Secondary | ICD-10-CM | POA: Diagnosis not present

## 2024-01-24 DIAGNOSIS — L720 Epidermal cyst: Secondary | ICD-10-CM | POA: Diagnosis not present

## 2024-01-24 DIAGNOSIS — L820 Inflamed seborrheic keratosis: Secondary | ICD-10-CM | POA: Diagnosis not present

## 2024-01-24 DIAGNOSIS — D1801 Hemangioma of skin and subcutaneous tissue: Secondary | ICD-10-CM | POA: Diagnosis not present

## 2024-01-24 DIAGNOSIS — L738 Other specified follicular disorders: Secondary | ICD-10-CM | POA: Diagnosis not present

## 2024-01-24 DIAGNOSIS — C44311 Basal cell carcinoma of skin of nose: Secondary | ICD-10-CM | POA: Diagnosis not present

## 2024-01-24 NOTE — Progress Notes (Unsigned)
 Subjective:    Patient ID: Joy Martin, female    DOB: Aug 22, 1941, 83 y.o.   MRN: [id number]      HPI Joy Martin is here for a Physical exam and her chronic medical problems.   Had parathyroidectomy - went well.   Overall doing well  -no concerns,    Medications and allergies reviewed with patient and updated if appropriate.  Current Outpatient Medications on File Prior to Visit  Medication Sig Dispense Refill   albuterol (VENTOLIN HFA) 108 (90 Base) MCG/ACT inhaler Inhale 2 puffs into the lungs every 6 (six) hours as needed for wheezing or shortness of breath. 8 g 6   amLODipine (NORVASC) 5 MG tablet TAKE 1 TABLET BY MOUTH EVERY DAY 90 tablet 0   aspirin-acetaminophen-caffeine (EXCEDRIN MIGRAINE) 250-250-65 MG tablet Take 2 tablets by mouth every 6 (six) hours as needed for headache.     atorvastatin (LIPITOR) 20 MG tablet TAKE 1 TABLET BY MOUTH EVERY DAY 90 tablet 3   Cranberry 500 MG CAPS Take 1,000 mg by mouth daily.     cycloSPORINE (RESTASIS) 0.05 % ophthalmic emulsion Place 1 drop into both eyes in the morning and at bedtime.     denosumab (PROLIA) 60 MG/ML SOSY injection Inject 60 mg into the skin every 6 (six) months.     Erenumab-aooe (AIMOVIG) 70 MG/ML SOAJ INJECT 70MG  INTO THE SKIN EVERY 30 DAYS 3 mL 3   esomeprazole (NEXIUM) 40 MG capsule TAKE 1 CAPSULE BY MOUTH DAILY AT 12 NOON. (Patient taking differently: Take 40 mg by mouth every other day.) 90 capsule 1   famotidine (PEPCID) 40 MG tablet TAKE 1 TABLET BY MOUTH EVERY DAY 90 tablet 2   Flaxseed, Linseed, (FLAXSEED OIL PO) Take 1 capsule by mouth in the morning and at bedtime.     latanoprost (XALATAN) 0.005 % ophthalmic solution Place 1 drop into both eyes at bedtime.     nadolol (CORGARD) 40 MG tablet TAKE 1 TABLET BY MOUTH TWICE A DAY 180 tablet 1   TRELEGY ELLIPTA 100-62.5-25 MCG/ACT AEPB INHALE 1 PUFF INTO THE LUNGS EVERY DAY 120 each 5   Wheat Dextrin (BENEFIBER) POWD Take 1 Dose by mouth 2 (two) times  daily. 1 dose = 2 teaspoons     [DISCONTINUED] Calcium Carbonate (CALCIUM 500 PO) Take by mouth.       No current facility-administered medications on file prior to visit.    Review of Systems  Constitutional:  Negative for fever.  HENT:  Positive for postnasal drip.   Eyes:  Negative for visual disturbance.  Respiratory:  Positive for cough (dry - since recent cold) and shortness of breath (chronic - ? alittle worse or same). Negative for wheezing.   Cardiovascular:  Positive for leg swelling (feet - mild occ). Negative for chest pain and palpitations.  Gastrointestinal:  Positive for constipation. Negative for abdominal pain, blood in stool (no melena) and diarrhea.       Gerd controlled  Genitourinary:  Negative for dysuria.  Musculoskeletal:  Positive for arthralgias (knee, thumb) and back pain.  Neurological:  Negative for light-headedness and headaches.  Psychiatric/Behavioral:  Negative for dysphoric mood. The patient is not nervous/anxious.        Objective:   Vitals:   01/25/24 1259  BP: 126/70  Pulse: 62  Temp: 98 F (36.7 C)  SpO2: 95%   Filed Weights   01/25/24 1259  Weight: 160 lb (72.6 kg)   Body mass index is 30.23 kg/m.  BP Readings from Last 3 Encounters:  01/25/24 126/70  10/21/23 136/83  10/03/23 (!) 152/88    Wt Readings from Last 3 Encounters:  01/25/24 160 lb (72.6 kg)  10/21/23 154 lb 5.2 oz (70 kg)  10/03/23 155 lb (70.3 kg)       Physical Exam Constitutional: She appears well-developed and well-nourished. No distress.  HENT:  Head: Normocephalic and atraumatic.  Right Ear: External ear normal. Normal ear canal and TM Left Ear: External ear normal.  Normal ear canal and TM Mouth/Throat: Oropharynx is clear and moist.  Eyes: Conjunctivae normal.  Neck: Neck supple. No tracheal deviation present. No thyromegaly present.  No carotid bruit  Cardiovascular: Normal rate, regular rhythm and normal heart sounds.   No murmur heard.  No  edema. Pulmonary/Chest: Effort normal and breath sounds normal. No respiratory distress. She has no wheezes. She has no rales.  Breast: deferred   Abdominal: Soft. She exhibits no distension. There is no tenderness.  Lymphadenopathy: She has no cervical adenopathy.  Skin: Skin is warm and dry. She is not diaphoretic.  Psychiatric: She has a normal mood and affect. Her behavior is normal.     Lab Results  Component Value Date   WBC 9.6 01/25/2024   HGB 11.9 (L) 01/25/2024   HCT 37.2 01/25/2024   PLT 403.0 (H) 01/25/2024   GLUCOSE 97 01/25/2024   CHOL 149 01/25/2024   TRIG 91.0 01/25/2024   HDL 70.60 01/25/2024   LDLDIRECT 143.2 12/19/2007   LDLCALC 60 01/25/2024   ALT 8 01/25/2024   AST 20 01/25/2024   NA 141 01/25/2024   K 4.0 01/25/2024   CL 105 01/25/2024   CREATININE 1.22 (H) 01/25/2024   BUN 21 01/25/2024   CO2 28 01/25/2024   TSH 2.26 12/05/2020   HGBA1C 6.4 01/25/2024         Assessment & Plan:   Physical exam: Screening blood work  ordered Exercise  none Weight  ok for age Substance abuse  none   Reviewed recommended immunizations.   Health Maintenance  Topic Date Due   Medicare Annual Wellness (AWV)  03/14/2024   DTaP/Tdap/Td (3 - Tdap) 01/24/2025 (Originally 12/18/2017)   COVID-19 Vaccine (8 - Pfizer risk 2024-25 season) 02/22/2024   DEXA SCAN  11/20/2025   Pneumonia Vaccine 69+ Years old  Completed   INFLUENZA VACCINE  Completed   Zoster Vaccines- Shingrix  Completed   HPV VACCINES  Aged Out          See Problem List for Assessment and Plan of chronic medical problems.

## 2024-01-24 NOTE — Patient Instructions (Addendum)

## 2024-01-25 ENCOUNTER — Telehealth: Payer: Self-pay

## 2024-01-25 ENCOUNTER — Other Ambulatory Visit (HOSPITAL_COMMUNITY): Payer: Self-pay

## 2024-01-25 ENCOUNTER — Other Ambulatory Visit: Payer: Self-pay

## 2024-01-25 ENCOUNTER — Ambulatory Visit (INDEPENDENT_AMBULATORY_CARE_PROVIDER_SITE_OTHER): Payer: BC Managed Care – PPO | Admitting: Internal Medicine

## 2024-01-25 VITALS — BP 126/70 | HR 62 | Temp 98.0°F | Ht 61.0 in | Wt 160.0 lb

## 2024-01-25 DIAGNOSIS — E7800 Pure hypercholesterolemia, unspecified: Secondary | ICD-10-CM | POA: Diagnosis not present

## 2024-01-25 DIAGNOSIS — N1831 Chronic kidney disease, stage 3a: Secondary | ICD-10-CM

## 2024-01-25 DIAGNOSIS — M810 Age-related osteoporosis without current pathological fracture: Secondary | ICD-10-CM

## 2024-01-25 DIAGNOSIS — K219 Gastro-esophageal reflux disease without esophagitis: Secondary | ICD-10-CM

## 2024-01-25 DIAGNOSIS — D500 Iron deficiency anemia secondary to blood loss (chronic): Secondary | ICD-10-CM

## 2024-01-25 DIAGNOSIS — I10 Essential (primary) hypertension: Secondary | ICD-10-CM

## 2024-01-25 DIAGNOSIS — R7303 Prediabetes: Secondary | ICD-10-CM

## 2024-01-25 DIAGNOSIS — Z0000 Encounter for general adult medical examination without abnormal findings: Secondary | ICD-10-CM | POA: Diagnosis not present

## 2024-01-25 LAB — COMPREHENSIVE METABOLIC PANEL
ALT: 8 U/L (ref 0–35)
AST: 20 U/L (ref 0–37)
Albumin: 4 g/dL (ref 3.5–5.2)
Alkaline Phosphatase: 135 U/L — ABNORMAL HIGH (ref 39–117)
BUN: 21 mg/dL (ref 6–23)
CO2: 28 meq/L (ref 19–32)
Calcium: 9.6 mg/dL (ref 8.4–10.5)
Chloride: 105 meq/L (ref 96–112)
Creatinine, Ser: 1.22 mg/dL — ABNORMAL HIGH (ref 0.40–1.20)
GFR: 40.63 mL/min — ABNORMAL LOW (ref >60.00)
Glucose, Bld: 97 mg/dL (ref 70–99)
Potassium: 4 meq/L (ref 3.5–5.1)
Sodium: 141 meq/L (ref 135–145)
Total Bilirubin: 0.3 mg/dL (ref 0.2–1.2)
Total Protein: 7.9 g/dL (ref 6.0–8.3)

## 2024-01-25 LAB — CBC WITH DIFFERENTIAL/PLATELET
Basophils Absolute: 0.1 10*3/uL (ref 0.0–0.1)
Basophils Relative: 0.8 % (ref 0.0–3.0)
Eosinophils Absolute: 0.4 10*3/uL (ref 0.0–0.7)
Eosinophils Relative: 4.4 % (ref 0.0–5.0)
HCT: 37.2 % (ref 36.0–46.0)
Hemoglobin: 11.9 g/dL — ABNORMAL LOW (ref 12.0–15.0)
Lymphocytes Relative: 12.1 % (ref 12.0–46.0)
Lymphs Abs: 1.2 10*3/uL (ref 0.7–4.0)
MCHC: 32.1 g/dL (ref 30.0–36.0)
MCV: 89.1 fl (ref 78.0–100.0)
Monocytes Absolute: 0.6 10*3/uL (ref 0.1–1.0)
Monocytes Relative: 6.4 % (ref 3.0–12.0)
Neutro Abs: 7.3 10*3/uL (ref 1.4–7.7)
Neutrophils Relative %: 76.3 % (ref 43.0–77.0)
Platelets: 403 10*3/uL — ABNORMAL HIGH (ref 150.0–400.0)
RBC: 4.17 Mil/uL (ref 3.87–5.11)
RDW: 16.8 % — ABNORMAL HIGH (ref 11.5–15.5)
WBC: 9.6 10*3/uL (ref 4.0–10.5)

## 2024-01-25 LAB — LIPID PANEL
Cholesterol: 149 mg/dL (ref 0–200)
HDL: 70.6 mg/dL (ref >39.00)
LDL Cholesterol: 60 mg/dL (ref 0–99)
NonHDL: 78.55
Total CHOL/HDL Ratio: 2
Triglycerides: 91 mg/dL (ref 0.0–149.0)
VLDL: 18.2 mg/dL (ref 0.0–40.0)

## 2024-01-25 LAB — HEMOGLOBIN A1C: Hgb A1c MFr Bld: 6.4 % (ref 4.6–6.5)

## 2024-01-25 LAB — IBC PANEL
Iron: 42 ug/dL (ref 42–145)
Saturation Ratios: 9.7 % — ABNORMAL LOW (ref 20.0–50.0)
TIBC: 431.2 ug/dL (ref 250.0–450.0)
Transferrin: 308 mg/dL (ref 212.0–360.0)

## 2024-01-25 LAB — VITAMIN D 25 HYDROXY (VIT D DEFICIENCY, FRACTURES): VITD: 25.36 ng/mL — ABNORMAL LOW (ref 30.00–100.00)

## 2024-01-25 LAB — FERRITIN: Ferritin: 14.5 ng/mL (ref 10.0–291.0)

## 2024-01-25 MED ORDER — DENOSUMAB 60 MG/ML ~~LOC~~ SOSY
60.0000 mg | PREFILLED_SYRINGE | Freq: Once | SUBCUTANEOUS | Status: AC
Start: 2024-01-25 — End: 2024-04-11
  Administered 2024-04-11: 60 mg via SUBCUTANEOUS

## 2024-01-25 NOTE — Assessment & Plan Note (Signed)
 Chronic Following with nephrology Continue nadolol 40 mg twice daily, amlodipine 5 mg daily BP controlled Cmp, cbc

## 2024-01-25 NOTE — Telephone Encounter (Signed)
 Prolia VOB initiated via [email or web address]

## 2024-01-25 NOTE — Assessment & Plan Note (Signed)
 Chronic Lab Results  Component Value Date   HGBA1C 5.6 07/27/2023    Check a1c Low sugar / carb diet encouraged regular exercise

## 2024-01-25 NOTE — Assessment & Plan Note (Signed)
 Chronic Blood pressure well controlled Continue amlodipine 5 mg daily, nadolol 40 mg twice daily Continue to monitor BP CBC, CMP

## 2024-01-25 NOTE — Assessment & Plan Note (Signed)
Chronic Check lipid panel  Continue atorvastatin 20 mg daily Regular exercise and healthy diet encouraged  

## 2024-01-25 NOTE — Telephone Encounter (Signed)
 Pt ready for scheduling for Prolia on or after : 01/25/24  Option# 1: Buy/Bill (Office supplied medication)  Out-of-pocket cost due at time of clinic visit: $40  Number of injection/visits approved: --  Primary: [employer] - Medicare Prolia co-insurance: $40 Admin fee co-insurance: 0%  Secondary: not Prolia co-insurance:  Admin fee co-insurance:   Medical Benefit Details: Date Benefits were checked: 3/19-25 Deductible: no/ Coinsurance: $40/ Admin Fee: 0%  Prior Auth: Approved PA# [id number] Expiration Date: 11/09/23 to 11/07/24  # of doses approved: ----------------------------------------------------------------------- Option# 2- Med Obtained from pharmacy:  Pharmacy benefit: Copay $64 (Paid to pharmacy) Admin Fee: $20 (Pay at clinic)  Prior Auth: approved PA# [id number] Expiration Date: 11/09/23 to 11/07/24  # of doses approved:   If patient wants fill through the pharmacy benefit please send prescription to:  [facility] , and include estimated need by date in rx notes. Pharmacy will ship medication directly to the office.  Patient not eligible for Prolia Copay Card. Copay Card can make patient's cost as little as $25. Link to apply: [email or web address]://[email or web address]  ** This summary of benefits is an estimation of the patient's out-of-pocket cost. Exact cost may very based on individual plan coverage.

## 2024-01-25 NOTE — Telephone Encounter (Signed)
 Marland Kitchen

## 2024-01-25 NOTE — Assessment & Plan Note (Addendum)
 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

## 2024-01-25 NOTE — Assessment & Plan Note (Addendum)
 Chronic Continue Prolia every 6 months Last Prolia 03/2023-overdue-will try to get for this ASAP Continue vitamin D daily Check vitamin D level

## 2024-01-25 NOTE — Assessment & Plan Note (Signed)
Chronic GERD controlled Continue Nexium 40 mg daily, famotidine 40 mg daily

## 2024-01-26 ENCOUNTER — Encounter: Payer: Self-pay | Admitting: Internal Medicine

## 2024-01-30 NOTE — Telephone Encounter (Signed)
 Sent to PA team.

## 2024-01-31 DIAGNOSIS — N1832 Chronic kidney disease, stage 3b: Secondary | ICD-10-CM | POA: Diagnosis not present

## 2024-02-08 DIAGNOSIS — N1832 Chronic kidney disease, stage 3b: Secondary | ICD-10-CM | POA: Diagnosis not present

## 2024-02-08 DIAGNOSIS — D631 Anemia in chronic kidney disease: Secondary | ICD-10-CM | POA: Diagnosis not present

## 2024-02-08 DIAGNOSIS — I129 Hypertensive chronic kidney disease with stage 1 through stage 4 chronic kidney disease, or unspecified chronic kidney disease: Secondary | ICD-10-CM | POA: Diagnosis not present

## 2024-02-08 DIAGNOSIS — E213 Hyperparathyroidism, unspecified: Secondary | ICD-10-CM | POA: Diagnosis not present

## 2024-03-05 DIAGNOSIS — C44311 Basal cell carcinoma of skin of nose: Secondary | ICD-10-CM | POA: Diagnosis not present

## 2024-03-05 DIAGNOSIS — Z85828 Personal history of other malignant neoplasm of skin: Secondary | ICD-10-CM | POA: Diagnosis not present

## 2024-03-09 ENCOUNTER — Other Ambulatory Visit: Payer: Self-pay | Admitting: Internal Medicine

## 2024-03-26 DIAGNOSIS — H524 Presbyopia: Secondary | ICD-10-CM | POA: Diagnosis not present

## 2024-03-26 DIAGNOSIS — H353131 Nonexudative age-related macular degeneration, bilateral, early dry stage: Secondary | ICD-10-CM | POA: Diagnosis not present

## 2024-03-26 DIAGNOSIS — Z961 Presence of intraocular lens: Secondary | ICD-10-CM | POA: Diagnosis not present

## 2024-03-26 DIAGNOSIS — H35033 Hypertensive retinopathy, bilateral: Secondary | ICD-10-CM | POA: Diagnosis not present

## 2024-03-26 DIAGNOSIS — H40023 Open angle with borderline findings, high risk, bilateral: Secondary | ICD-10-CM | POA: Diagnosis not present

## 2024-03-26 DIAGNOSIS — H52223 Regular astigmatism, bilateral: Secondary | ICD-10-CM | POA: Diagnosis not present

## 2024-04-11 ENCOUNTER — Ambulatory Visit

## 2024-04-11 DIAGNOSIS — M810 Age-related osteoporosis without current pathological fracture: Secondary | ICD-10-CM

## 2024-04-11 NOTE — Progress Notes (Signed)
 Prolia given without complications.

## 2024-05-03 ENCOUNTER — Ambulatory Visit (INDEPENDENT_AMBULATORY_CARE_PROVIDER_SITE_OTHER)

## 2024-05-03 VITALS — Ht 61.0 in | Wt 160.0 lb

## 2024-05-03 DIAGNOSIS — Z0000 Encounter for general adult medical examination without abnormal findings: Secondary | ICD-10-CM

## 2024-05-03 NOTE — Progress Notes (Signed)
 Subjective:   Joy Martin is a 83 y.o. who presents for a Medicare Wellness preventive visit.  As a reminder, Annual Wellness Visits don't include a physical exam, and some assessments may be limited, especially if this visit is performed virtually. We may recommend an in-person follow-up visit with your provider if needed.  Visit Complete: Virtual I connected with  Harold A Lammert on 05/03/24 by a audio enabled telemedicine application and verified that I am speaking with the correct person using two identifiers.  Patient Location: Home  Provider Location: Office/Clinic  I discussed the limitations of evaluation and management by telemedicine. The patient expressed understanding and agreed to proceed.  Vital Signs: Because this visit was a virtual/telehealth visit, some criteria may be missing or patient reported. Any vitals not documented were not able to be obtained and vitals that have been documented are patient reported.  VideoDeclined- This patient declined Librarian, academic. Therefore the visit was completed with audio only.  Persons Participating in Visit: Patient.  AWV Questionnaire: No: Patient Medicare AWV questionnaire was not completed prior to this visit.  Cardiac Risk Factors include: advanced age (>64men, >98 women);dyslipidemia;hypertension;obesity (BMI >30kg/m2)     Objective:    Today's Vitals   05/03/24 1054  Weight: 160 lb (72.6 kg)  Height: 5' 1 (1.549 m)   Body mass index is 30.23 kg/m.     05/03/2024   10:53 AM 10/21/2023    7:36 AM 10/03/2023   11:03 AM 06/04/2023    8:00 PM 03/15/2023    2:35 PM 03/12/2022   11:34 AM 03/05/2021   10:23 AM  Advanced Directives  Does Patient Have a Medical Advance Directive? Yes Yes Yes Yes Yes Yes Yes  Type of Estate agent of [place];Living will Healthcare Power of [place];Living will Healthcare Power of [place];Living will Living will Healthcare Power of  [place];Living will Healthcare Power of [place];Living will Living will;Healthcare Power of Attorney  Does patient want to make changes to medical advance directive? No - Patient declined No - Patient declined  No - Patient declined   No - Patient declined  Copy of Healthcare Power of Attorney in Chart? Yes - validated most recent copy scanned in chart (See row information) No - copy requested No - copy requested  No - copy requested No - copy requested No - copy requested    Current Medications (verified) Outpatient Encounter Medications as of 05/03/2024  Medication Sig   albuterol  (VENTOLIN  HFA) 108 (90 Base) MCG/ACT inhaler Inhale 2 puffs into the lungs every 6 (six) hours as needed for wheezing or shortness of breath.   amLODipine  (NORVASC ) 5 MG tablet TAKE 1 TABLET BY MOUTH EVERY DAY   aspirin -acetaminophen -caffeine (EXCEDRIN MIGRAINE) 250-250-65 MG tablet Take 2 tablets by mouth every 6 (six) hours as needed for headache.   atorvastatin  (LIPITOR) 20 MG tablet TAKE 1 TABLET BY MOUTH EVERY DAY   Cranberry 500 MG CAPS Take 1,000 mg by mouth daily.   cycloSPORINE  (RESTASIS ) 0.05 % ophthalmic emulsion Place 1 drop into both eyes in the morning and at bedtime.   denosumab  (PROLIA ) 60 MG/ML SOSY injection Inject 60 mg into the skin every 6 (six) months.   Erenumab -aooe (AIMOVIG ) 70 MG/ML SOAJ INJECT 70MG  INTO THE SKIN EVERY 30 DAYS   esomeprazole  (NEXIUM ) 40 MG capsule TAKE 1 CAPSULE BY MOUTH DAILY AT 12 NOON.   famotidine  (PEPCID ) 40 MG tablet TAKE 1 TABLET BY MOUTH EVERY DAY   Flaxseed, Linseed, (FLAXSEED OIL  PO) Take 1 capsule by mouth in the morning and at bedtime.   latanoprost (XALATAN) 0.005 % ophthalmic solution Place 1 drop into both eyes at bedtime.   nadolol  (CORGARD ) 40 MG tablet TAKE 1 TABLET BY MOUTH TWICE A DAY   TRELEGY ELLIPTA  100-62.5-25 MCG/ACT AEPB INHALE 1 PUFF INTO THE LUNGS EVERY DAY   Wheat Dextrin (BENEFIBER) POWD Take 1 Dose by mouth 2 (two) times daily. 1 dose = 2  teaspoons   [DISCONTINUED] Calcium  Carbonate (CALCIUM  500 PO) Take by mouth.     No facility-administered encounter medications on file as of 05/03/2024.    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   removed right upper lip/perinostril Adrien Blake ([facility])   Chronic sinusitis    follows with ENT for same   CKD (chronic kidney disease) 2018   COPD (chronic obstructive pulmonary disease) (HCC) dx 2013   GOLD 1, follows with pulm for same   Episodic tension type headache    EXOGENOUS OBESITY    Family history of adverse reaction to anesthesia    sister PONV   GERD    Heart murmur    as a baby   HYPERLIPIDEMIA    Hypertension    Migraines 07/10/2012   over; last one was @ age 28   OSTEOPENIA    Pneumonia 2018-2019   x 2   Pre-diabetes    Past Surgical History:  Procedure Laterality Date   APPENDECTOMY  07/10/2012   BREAST CYST ASPIRATION  ?1980's   right   EYE SURGERY     both eyes   KNEE ARTHROSCOPY  1970's or 1980's   right; torn cartilage   LAPAROSCOPIC APPENDECTOMY  07/10/2012   Procedure: APPENDECTOMY LAPAROSCOPIC;  Surgeon: Vicenta DELENA Poli, MD;  Location: [facility] OR;  Service: General;  Laterality: N/A;   MOHS SURGERY  01/2016   nose   MOLE REMOVAL  1957   my back   PARATHYROIDECTOMY Left 10/21/2023   Procedure: LEFT  SUPERIOR AND INFERIOR PARATHYROIDECTOMY;  Surgeon: Eletha Boas, MD;  Location: [facility];  Service: General;  Laterality: Left;  2ND SCRUB PERSON SPECIAL REQUESTS: FROZEN SECTION   SKIN CANCER EXCISION     2 on my back; 2 on my face   TONSILLECTOMY AND ADENOIDECTOMY  1946   WISDOM TOOTH EXTRACTION  1959   Family History  Problem Relation Age of Onset   Prostate cancer Father    Cancer Father        prostate   Cancer Mother         [place] microgobular anemia   Hyperparathyroidism Mother    Alcohol abuse Other    Heart disease Other    Hypertension Other    Diabetes Other    Pancreatic cancer Other    Hyperparathyroidism Sister    Cancer Maternal Grandfather        pancreatic   Social History   Socioeconomic History   Marital status: Single    Spouse name: Not on file   Number of children: 0   Years of education: 16   Highest education level: Bachelor's degree (e.g., BA, AB, BS)  Occupational History    Comment: retired Runner, broadcasting/film/video, 5th grade  Tobacco Use   Smoking status: Former    Current packs/day: 0.00  Average packs/day: 0.8 packs/day for 20.0 years (15.0 ttl pk-yrs)    Types: Cigarettes    Start date: 07/25/1962    Quit date: 07/25/1982    Years since quitting: 41.8   Smokeless tobacco: Never   Tobacco comments:    07/10/2012 stopped smoking cigarettes 1980's  Vaping Use   Vaping status: Never Used  Substance and Sexual Activity   Alcohol use: Yes    Alcohol/week: 1.0 standard drink of alcohol    Types: 1 Glasses of wine per week    Comment: rare   Drug use: No   Sexual activity: Never  Other Topics Concern   Not on file  Social History Narrative   Retired Runner, broadcasting/film/video, 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   Caffeine- coffee 1 cup, some Coke   Social Drivers of Corporate investment banker Strain: Low Risk  (05/03/2024)   Overall Financial Resource Strain (CARDIA)    Difficulty of Paying Living Expenses: Not hard at all  Food Insecurity: No Food Insecurity (05/03/2024)   Hunger Vital Sign    Worried About Running Out of Food in the Last Year: Never true    Ran Out of Food in the Last Year: Never true  Transportation Needs: No Transportation Needs (05/03/2024)   PRAPARE - Administrator, Civil Service (Medical): No    Lack of Transportation (Non-Medical): No  Physical Activity: Insufficiently Active (05/03/2024)   Exercise Vital Sign    Days of Exercise per Week: 3 days    Minutes of  Exercise per Session: 10 min  Stress: No Stress Concern Present (05/03/2024)   [employer] of Occupational Health - Occupational Stress Questionnaire    Feeling of Stress: Not at all  Social Connections: Socially Isolated (05/03/2024)   Social Connection and Isolation Panel    Frequency of Communication with Friends and Family: More than three times a week    Frequency of Social Gatherings with Friends and Family: Once a week    Attends Religious Services: Never    Database administrator or Organizations: No    Attends Banker Meetings: Never    Marital Status: Never married    Tobacco Counseling Counseling given: No Tobacco comments: 07/10/2012 stopped smoking cigarettes 1980's    Clinical Intake:  Pre-visit preparation completed: Yes  Pain : No/denies pain     BMI - recorded: 30.23 Nutritional Status: BMI > 30  Obese Nutritional Risks: None Diabetes: No  Lab Results  Component Value Date   HGBA1C 6.4 01/25/2024   HGBA1C 5.6 07/27/2023   HGBA1C 6.1 12/31/2022     How often do you need to have someone help you when you read instructions, pamphlets, or other written materials from your doctor or pharmacy?: 1 - Never  Interpreter Needed?: No  Information entered by :: Verdie Saba, CMA   Activities of Daily Living     05/03/2024   10:58 AM 10/03/2023   11:04 AM  In your present state of health, do you have any difficulty performing the following activities:  Hearing? 0   Vision? 0   Difficulty concentrating or making decisions? 0   Walking or climbing stairs? 0   Dressing or bathing? 0   Doing errands, shopping? 0 0  Preparing Food and eating ? N   Using the Toilet? N   In the past six months, have you accidently leaked urine? N   Do you have problems with loss of bowel control? N   Managing your Medications? N  Managing your Finances? N   Housekeeping or managing your Housekeeping? N     Patient Care Team: Geofm Glade PARAS, MD as PCP  - General (Internal Medicine) Lonni Slain, MD as PCP - Cardiology (Cardiology) Darlean Ozell NOVAK, MD (Pulmonary Disease) Roark Rush, MD (Otolaryngology) Kennyth Cy RAMAN, DO as Consulting Physician (Ophthalmology)  I have updated your Care Teams any recent Medical Services you may have received from other providers in the past year.     Assessment:   This is a routine wellness examination for Nasrin.  Hearing/Vision screen Hearing Screening - Comments:: Denies hearing difficulties   Vision Screening - Comments:: Wears rx glasses - up to date with routine eye exams with Dr Kennyth   Goals Addressed               This Visit's Progress     Weight (lb) < 200 lb (90.7 kg) (pt-stated)   160 lb (72.6 kg)     Patient stated she wants to lose about 15lbs        Depression Screen     05/03/2024   11:01 AM 01/25/2024    1:10 PM 06/15/2023    8:14 AM 03/15/2023    2:36 PM 12/31/2022    9:13 AM 07/01/2022   10:29 AM 03/12/2022   11:34 AM  PHQ 2/9 Scores  PHQ - 2 Score 0 0 0 0 0 3 0  PHQ- 9 Score 3  0 3  4     Fall Risk     05/03/2024   10:59 AM 01/25/2024    1:09 PM 06/15/2023    8:12 AM 03/15/2023    2:36 PM 12/31/2022    9:13 AM  Fall Risk   Falls in the past year? 0 0 0 0 0  Number falls in past yr: 0 0 0 0 0  Injury with Fall? 0 0 0 0 0  Risk for fall due to : No Fall Risks No Fall Risks No Fall Risks No Fall Risks No Fall Risks  Follow up Falls evaluation completed;Falls prevention discussed Falls evaluation completed Falls evaluation completed Falls prevention discussed Falls evaluation completed    MEDICARE RISK AT HOME:  Medicare Risk at Home Any stairs in or around the home?: No If so, are there any without handrails?: No Home free of loose throw rugs in walkways, pet beds, electrical cords, etc?: Yes Adequate lighting in your home to reduce risk of falls?: Yes Life alert?: No Use of a cane, walker or w/c?: Yes (cane) Grab bars in the bathroom?: Yes Shower chair  or bench in shower?: Yes Elevated toilet seat or a handicapped toilet?: Yes  TIMED UP AND GO:  Was the test performed?  No  Cognitive Function: 6CIT completed    07/28/2016    1:58 PM  MMSE - Mini Mental State Exam  Not completed: --        05/03/2024   11:04 AM 03/15/2023    2:39 PM  6CIT Screen  What Year? 0 points 0 points  What month? 0 points 0 points  What time? 0 points 0 points  Count back from 20 0 points 0 points  Months in reverse 0 points 0 points  Repeat phrase 0 points 0 points  Total Score 0 points 0 points    Immunizations Immunization History  Administered Date(s) Administered   Fluad Quad(high Dose 65+) 07/11/2020, 08/28/2021   Fluad Trivalent(High Dose 65+) 08/24/2023   Influenza Split 08/13/2011, 07/18/2012, 07/22/2014   Influenza  Whole 08/23/2007, 08/21/2008, 08/05/2009, 07/29/2010   Influenza, High Dose Seasonal PF 07/26/2013, 07/22/2014, 08/16/2018, 08/08/2019, 08/03/2022   Influenza-Unspecified 07/19/2016, 07/19/2017, 08/16/2018   Moderna Covid-19 Fall Seasonal Vaccine 20yrs & older 08/24/2023   PFIZER Comirnaty(Gray Top)Covid-19 Tri-Sucrose Vaccine 02/13/2021   PFIZER(Purple Top)SARS-COV-2 Vaccination 11/28/2019, 12/19/2019, 08/21/2020   Pfizer Covid-19 Vaccine Bivalent Booster 89yrs & up 08/21/2021   Pneumococcal Conjugate-13 11/06/2013   Pneumococcal Polysaccharide-23 12/19/2007   Respiratory Syncytial Virus Vaccine,Recomb Aduvanted(Arexvy) 09/03/2022   Td 11/08/1996, 12/19/2007   Unspecified SARS-COV-2 Vaccination 08/17/2022   Zoster Recombinant(Shingrix) 03/03/2017, 08/29/2017   Zoster, Live 03/24/2006    Screening Tests Health Maintenance  Topic Date Due   COVID-19 Vaccine (8 - Pfizer risk 2024-25 season) 02/22/2024   DTaP/Tdap/Td (3 - Tdap) 01/24/2025 (Originally 12/18/2017)   INFLUENZA VACCINE  06/08/2024   Medicare Annual Wellness (AWV)  05/03/2025   DEXA SCAN  11/20/2025   Pneumococcal Vaccine: 50+ Years  Completed   Zoster  Vaccines- Shingrix  Completed   Hepatitis B Vaccines  Aged Out   HPV VACCINES  Aged Out   Meningococcal B Vaccine  Aged Out    Health Maintenance  Health Maintenance Due  Topic Date Due   COVID-19 Vaccine (8 - Pfizer risk 2024-25 season) 02/22/2024   Health Maintenance Items Addressed: 05/03/2024   Additional Screening:  Vision Screening: Recommended annual ophthalmology exams for early detection of glaucoma and other disorders of the eye. Would you like a referral to an eye doctor? No    Dental Screening: Recommended annual dental exams for proper oral hygiene  Community Resource Referral / Chronic Care Management: CRR required this visit?  No   CCM required this visit?  No   Plan:    I have personally reviewed and noted the following in the patient's chart:   Medical and social history Use of alcohol, tobacco or illicit drugs  Current medications and supplements including opioid prescriptions. Patient is not currently taking opioid prescriptions. Functional ability and status Nutritional status Physical activity Advanced directives List of other physicians Hospitalizations, surgeries, and ER visits in previous 12 months Vitals Screenings to include cognitive, depression, and falls Referrals and appointments  In addition, I have reviewed and discussed with patient certain preventive protocols, quality metrics, and best practice recommendations. A written personalized care plan for preventive services as well as general preventive health recommendations were provided to patient.   Verdie CHRISTELLA Saba, CMA   05/03/2024   After Visit Summary: (MyChart) Due to this being a telephonic visit, the after visit summary with patients personalized plan was offered to patient via MyChart   Notes: Nothing significant to report at this time.

## 2024-05-03 NOTE — Patient Instructions (Addendum)
 Joy Martin , Thank you for taking time out of your busy schedule to complete your Annual Wellness Visit with me. I enjoyed our conversation and look forward to speaking with you again next year. I, as well as your care team,  appreciate your ongoing commitment to your health goals. Please review the following plan we discussed and let me know if I can assist you in the future. Your Game plan/ To Do List    Follow up Visits: Next Medicare AWV with our clinical staff: 05/06/2025   Have you seen your provider in the last 6 months (3 months if uncontrolled diabetes)? Yes Next Office Visit with your provider: 07/27/2024  Clinician Recommendations:  Aim for 30 minutes of exercise or brisk walking, 6-8 glasses of water, and 5 servings of fruits and vegetables each day. Educated and advised on getting the Tdap(Tetenus) vaccine in 2025 at local pharmacy.      This is a list of the screening recommended for you and due dates:  Health Maintenance  Topic Date Due   COVID-19 Vaccine (8 - Pfizer risk 2024-25 season) 02/22/2024   DTaP/Tdap/Td vaccine (3 - Tdap) 01/24/2025*   Flu Shot  06/08/2024   Medicare Annual Wellness Visit  05/03/2025   DEXA scan (bone density measurement)  11/20/2025   Pneumococcal Vaccine for age over 2  Completed   Zoster (Shingles) Vaccine  Completed   Hepatitis B Vaccine  Aged Out   HPV Vaccine  Aged Out   Meningitis B Vaccine  Aged Out  *Topic was postponed. The date shown is not the original due date.    Advanced directives: (In Chart) A copy of your advanced directives are scanned into your chart should your provider ever need it. Advance Care Planning is important because it:  [x]  Makes sure you receive the medical care that is consistent with your values, goals, and preferences  [x]  It provides guidance to your family and loved ones and reduces their decisional burden about whether or not they are making the right decisions based on your wishes.  Follow the link  provided in your after visit summary or read over the paperwork we have mailed to you to help you started getting your Advance Directives in place. If you need assistance in completing these, please reach out to us  so that we can help you!

## 2024-05-06 ENCOUNTER — Other Ambulatory Visit: Payer: Self-pay | Admitting: Internal Medicine

## 2024-05-06 MED ORDER — AIMOVIG 70 MG/ML ~~LOC~~ SOAJ: SUBCUTANEOUS | 3 refills | Status: DC

## 2024-05-23 ENCOUNTER — Other Ambulatory Visit: Payer: Self-pay | Admitting: Internal Medicine

## 2024-06-25 ENCOUNTER — Encounter: Payer: Self-pay | Admitting: Internal Medicine

## 2024-06-26 ENCOUNTER — Other Ambulatory Visit: Payer: Self-pay

## 2024-06-26 MED ORDER — NADOLOL 40 MG PO TABS: 40.0000 mg | ORAL_TABLET | Freq: Two times a day (BID) | ORAL | 1 refills | Status: AC

## 2024-07-26 ENCOUNTER — Encounter: Payer: Self-pay | Admitting: Internal Medicine

## 2024-07-26 NOTE — Progress Notes (Unsigned)
 Subjective:    Patient ID: Joy Martin, female    DOB: 1941/03/26, 83 y.o.   MRN: [id number]     HPI Joy Martin is here for follow up of her chronic medical problems.  No concerns.   Doing about the same.    Still fatigued and has DOE.   DOE is about the same  No regular exercise.    Medications and allergies reviewed with patient and updated if appropriate.  Current Outpatient Medications on File Prior to Visit  Medication Sig Dispense Refill   albuterol  (VENTOLIN  HFA) 108 (90 Base) MCG/ACT inhaler Inhale 2 puffs into the lungs every 6 (six) hours as needed for wheezing or shortness of breath. 8 g 6   amLODipine  (NORVASC ) 5 MG tablet TAKE 1 TABLET BY MOUTH EVERY DAY 90 tablet 0   aspirin -acetaminophen -caffeine (EXCEDRIN MIGRAINE) 250-250-65 MG tablet Take 2 tablets by mouth every 6 (six) hours as needed for headache.     atorvastatin  (LIPITOR) 20 MG tablet TAKE 1 TABLET BY MOUTH EVERY DAY 90 tablet 3   Cranberry 500 MG CAPS Take 1,000 mg by mouth daily.     cycloSPORINE  (RESTASIS ) 0.05 % ophthalmic emulsion Place 1 drop into both eyes in the morning and at bedtime.     denosumab  (PROLIA ) 60 MG/ML SOSY injection Inject 60 mg into the skin every 6 (six) months.     esomeprazole  (NEXIUM ) 40 MG capsule TAKE 1 CAPSULE BY MOUTH DAILY AT 12 NOON. 90 capsule 1   famotidine  (PEPCID ) 40 MG tablet TAKE 1 TABLET BY MOUTH EVERY DAY 90 tablet 2   Flaxseed, Linseed, (FLAXSEED OIL PO) Take 1 capsule by mouth in the morning and at bedtime.     latanoprost (XALATAN) 0.005 % ophthalmic solution Place 1 drop into both eyes at bedtime.     nadolol  (CORGARD ) 40 MG tablet Take 1 tablet (40 mg total) by mouth 2 (two) times daily. 180 tablet 1   TRELEGY ELLIPTA  100-62.5-25 MCG/ACT AEPB INHALE 1 PUFF INTO THE LUNGS EVERY DAY 120 each 5   valsartan  (DIOVAN ) 160 MG tablet Take 160 mg by mouth daily.     Wheat Dextrin (BENEFIBER) POWD Take 1 Dose by mouth 2 (two) times daily. 1 dose = 2 teaspoons      [DISCONTINUED] Calcium  Carbonate (CALCIUM  500 PO) Take by mouth.       No current facility-administered medications on file prior to visit.     Review of Systems  Constitutional:  Negative for fever.  Respiratory:  Positive for cough (at ngiht - has a coughing fit and then it goes away) and shortness of breath (chronic - stable). Negative for chest tightness and wheezing.   Cardiovascular:  Negative for chest pain, palpitations and leg swelling.  Neurological:  Positive for light-headedness (rare) and headaches (chronic - not as many).       Objective:   Vitals:   07/27/24 0940  BP: 130/78  Pulse: 63  Temp: 98.1 F (36.7 C)  SpO2: 94%   BP Readings from Last 3 Encounters:  07/27/24 130/78  01/25/24 126/70  10/21/23 136/83   Wt Readings from Last 3 Encounters:  07/27/24 165 lb (74.8 kg)  05/03/24 160 lb (72.6 kg)  01/25/24 160 lb (72.6 kg)   Body mass index is 31.18 kg/m.    Physical Exam Constitutional:      General: She is not in acute distress.    Appearance: Normal appearance.  HENT:     Head: Normocephalic  and atraumatic.  Eyes:     Conjunctiva/sclera: Conjunctivae normal.  Cardiovascular:     Rate and Rhythm: Normal rate and regular rhythm.     Heart sounds: Normal heart sounds.  Pulmonary:     Effort: Pulmonary effort is normal. No respiratory distress.     Breath sounds: Normal breath sounds. No wheezing.  Musculoskeletal:     Cervical back: Neck supple.     Right lower leg: No edema.     Left lower leg: No edema.  Lymphadenopathy:     Cervical: No cervical adenopathy.  Skin:    General: Skin is warm and dry.     Findings: No rash.  Neurological:     Mental Status: She is alert. Mental status is at baseline.  Psychiatric:        Mood and Affect: Mood normal.        Behavior: Behavior normal.        Lab Results  Component Value Date   WBC 9.6 01/25/2024   HGB 11.9 (L) 01/25/2024   HCT 37.2 01/25/2024   PLT 403.0 (H) 01/25/2024    GLUCOSE 97 01/25/2024   CHOL 149 01/25/2024   TRIG 91.0 01/25/2024   HDL 70.60 01/25/2024   LDLDIRECT 143.2 12/19/2007   LDLCALC 60 01/25/2024   ALT 8 01/25/2024   AST 20 01/25/2024   NA 141 01/25/2024   K 4.0 01/25/2024   CL 105 01/25/2024   CREATININE 1.22 (H) 01/25/2024   BUN 21 01/25/2024   CO2 28 01/25/2024   TSH 2.26 12/05/2020   HGBA1C 6.4 01/25/2024     Assessment & Plan:    See Problem List for Assessment and Plan of chronic medical problems.

## 2024-07-26 NOTE — Patient Instructions (Addendum)
      Blood work was ordered.       Medications changes include :   ciclopirox  ointment for toe      Return in about 6 months (around 01/24/2025) for Physical Exam.

## 2024-07-27 ENCOUNTER — Ambulatory Visit: Admitting: Internal Medicine

## 2024-07-27 ENCOUNTER — Ambulatory Visit: Payer: Self-pay | Admitting: Internal Medicine

## 2024-07-27 VITALS — BP 130/78 | HR 63 | Temp 98.1°F | Ht 61.0 in | Wt 165.0 lb

## 2024-07-27 DIAGNOSIS — I10 Essential (primary) hypertension: Secondary | ICD-10-CM | POA: Diagnosis not present

## 2024-07-27 DIAGNOSIS — K219 Gastro-esophageal reflux disease without esophagitis: Secondary | ICD-10-CM

## 2024-07-27 DIAGNOSIS — M810 Age-related osteoporosis without current pathological fracture: Secondary | ICD-10-CM | POA: Diagnosis not present

## 2024-07-27 DIAGNOSIS — R7303 Prediabetes: Secondary | ICD-10-CM

## 2024-07-27 DIAGNOSIS — Z85828 Personal history of other malignant neoplasm of skin: Secondary | ICD-10-CM | POA: Diagnosis not present

## 2024-07-27 DIAGNOSIS — N1832 Chronic kidney disease, stage 3b: Secondary | ICD-10-CM

## 2024-07-27 DIAGNOSIS — E7800 Pure hypercholesterolemia, unspecified: Secondary | ICD-10-CM

## 2024-07-27 DIAGNOSIS — D500 Iron deficiency anemia secondary to blood loss (chronic): Secondary | ICD-10-CM

## 2024-07-27 DIAGNOSIS — J432 Centrilobular emphysema: Secondary | ICD-10-CM

## 2024-07-27 DIAGNOSIS — J449 Chronic obstructive pulmonary disease, unspecified: Secondary | ICD-10-CM | POA: Diagnosis not present

## 2024-07-27 LAB — LIPID PANEL
Cholesterol: 153 mg/dL (ref 0–200)
HDL: 61 mg/dL (ref >39.00)
LDL Cholesterol: 73 mg/dL (ref 0–99)
NonHDL: 91.78
Total CHOL/HDL Ratio: 3
Triglycerides: 92 mg/dL (ref 0.0–149.0)
VLDL: 18.4 mg/dL (ref 0.0–40.0)

## 2024-07-27 LAB — IBC PANEL
Iron: 73 ug/dL (ref 42–145)
Saturation Ratios: 22.8 % (ref 20.0–50.0)
TIBC: 320.6 ug/dL (ref 250.0–450.0)
Transferrin: 229 mg/dL (ref 212.0–360.0)

## 2024-07-27 LAB — CBC WITH DIFFERENTIAL/PLATELET
Basophils Absolute: 0.1 K/uL (ref 0.0–0.1)
Basophils Relative: 0.8 % (ref 0.0–3.0)
Eosinophils Absolute: 0.3 K/uL (ref 0.0–0.7)
Eosinophils Relative: 3.9 % (ref 0.0–5.0)
HCT: 42.7 % (ref 36.0–46.0)
Hemoglobin: 13.6 g/dL (ref 12.0–15.0)
Lymphocytes Relative: 10.3 % — ABNORMAL LOW (ref 12.0–46.0)
Lymphs Abs: 0.8 K/uL (ref 0.7–4.0)
MCHC: 31.8 g/dL (ref 30.0–36.0)
MCV: 95.7 fl (ref 78.0–100.0)
Monocytes Absolute: 0.5 K/uL (ref 0.1–1.0)
Monocytes Relative: 6.6 % (ref 3.0–12.0)
Neutro Abs: 6.3 K/uL (ref 1.4–7.7)
Neutrophils Relative %: 78.4 % — ABNORMAL HIGH (ref 43.0–77.0)
Platelets: 274 K/uL (ref 150.0–400.0)
RBC: 4.47 Mil/uL (ref 3.87–5.11)
RDW: 15.4 % (ref 11.5–15.5)
WBC: 8 K/uL (ref 4.0–10.5)

## 2024-07-27 LAB — COMPREHENSIVE METABOLIC PANEL WITH GFR
ALT: 12 U/L (ref 0–35)
AST: 18 U/L (ref 0–37)
Albumin: 3.9 g/dL (ref 3.5–5.2)
Alkaline Phosphatase: 96 U/L (ref 39–117)
BUN: 24 mg/dL — ABNORMAL HIGH (ref 6–23)
CO2: 29 meq/L (ref 19–32)
Calcium: 9.6 mg/dL (ref 8.4–10.5)
Chloride: 107 meq/L (ref 96–112)
Creatinine, Ser: 1.23 mg/dL — ABNORMAL HIGH (ref 0.40–1.20)
GFR: 40.09 mL/min — ABNORMAL LOW (ref >60.00)
Glucose, Bld: 89 mg/dL (ref 70–99)
Potassium: 3.9 meq/L (ref 3.5–5.1)
Sodium: 143 meq/L (ref 135–145)
Total Bilirubin: 0.4 mg/dL (ref 0.2–1.2)
Total Protein: 7.5 g/dL (ref 6.0–8.3)

## 2024-07-27 LAB — HEMOGLOBIN A1C: Hgb A1c MFr Bld: 6.6 % — ABNORMAL HIGH (ref 4.6–6.5)

## 2024-07-27 LAB — VITAMIN D 25 HYDROXY (VIT D DEFICIENCY, FRACTURES): VITD: 31.54 ng/mL (ref 30.00–100.00)

## 2024-07-27 LAB — FERRITIN: Ferritin: 49.6 ng/mL (ref 10.0–291.0)

## 2024-07-27 NOTE — Assessment & Plan Note (Signed)
 Chronic Following with nephrology Continue nadolol 40 mg twice daily, amlodipine 5 mg daily BP controlled Cmp, cbc

## 2024-07-27 NOTE — Assessment & Plan Note (Addendum)
 Chronic Lab Results  Component Value Date   HGBA1C 6.4 01/25/2024    Check a1c Discussed that she was close to being a diabetic Low sugar / carb diet encouraged regular exercise

## 2024-07-27 NOTE — Assessment & Plan Note (Signed)
 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

## 2024-07-27 NOTE — Assessment & Plan Note (Signed)
Chronic Check lipid panel  Continue atorvastatin 20 mg daily Regular exercise and healthy diet encouraged  

## 2024-07-27 NOTE — Assessment & Plan Note (Signed)
 Chronic Continue Prolia  every 6 months Last Prolia  04/2024-next due 10/2024 Continue vitamin D  daily Check vitamin D  level

## 2024-07-27 NOTE — Assessment & Plan Note (Addendum)
 Chronic Following with pulmonary-due for a follow-up and she will make an appointment Shortness of breath-chronic and stable Continue Trelegy inhaler daily, albuterol  inhaler as needed Encouraged more regular exercise-walking may actually help improve some of her shortness of breath

## 2024-07-27 NOTE — Assessment & Plan Note (Addendum)
 Chronic GERD controlled - rare GERD Continue Nexium  40 mg daily, famotidine  40 mg daily

## 2024-07-27 NOTE — Assessment & Plan Note (Signed)
 Chronic Blood pressure well controlled Continue amlodipine 5 mg daily, nadolol 40 mg twice daily Continue to monitor BP CBC, CMP

## 2024-07-27 NOTE — Assessment & Plan Note (Signed)
Continue to see derm annually, sooner if concerning changes

## 2024-08-10 ENCOUNTER — Other Ambulatory Visit: Payer: Self-pay | Admitting: Pulmonary Disease

## 2024-08-14 ENCOUNTER — Ambulatory Visit: Admitting: Pulmonary Disease

## 2024-08-14 ENCOUNTER — Encounter: Payer: Self-pay | Admitting: Pulmonary Disease

## 2024-08-14 DIAGNOSIS — J449 Chronic obstructive pulmonary disease, unspecified: Secondary | ICD-10-CM | POA: Diagnosis not present

## 2024-08-14 DIAGNOSIS — I10 Essential (primary) hypertension: Secondary | ICD-10-CM | POA: Diagnosis not present

## 2024-08-14 DIAGNOSIS — Z87891 Personal history of nicotine dependence: Secondary | ICD-10-CM | POA: Diagnosis not present

## 2024-08-14 DIAGNOSIS — K219 Gastro-esophageal reflux disease without esophagitis: Secondary | ICD-10-CM | POA: Diagnosis not present

## 2024-08-14 DIAGNOSIS — N189 Chronic kidney disease, unspecified: Secondary | ICD-10-CM

## 2024-08-14 MED ORDER — TRELEGY ELLIPTA 100-62.5-25 MCG/ACT IN AEPB
1.0000 | INHALATION_SPRAY | Freq: Every day | RESPIRATORY_TRACT | 5 refills | Status: AC
Start: 2024-08-14 — End: ?

## 2024-08-14 MED ORDER — ALBUTEROL SULFATE HFA 108 (90 BASE) MCG/ACT IN AERS: 2.0000 | INHALATION_SPRAY | Freq: Four times a day (QID) | RESPIRATORY_TRACT | 6 refills | Status: AC | PRN

## 2024-08-14 NOTE — Progress Notes (Unsigned)
 Synopsis: Referred in April 2023 for COPD by Glade Hope, MD  Subjective:   PATIENT ID: Joy Martin GENDER: female DOB: November 23, 1940, MRN: [id number]  HPI  Chief Complaint  Patient presents with   Medical Management of Chronic Issues   Allessandra Martin is an 83 year old woman, former smoker with CKD, GERD, and hypertension who returns to pulmonary clinic for COPD.   She has been doing well since last visit. She does notice exertional dyspnea. She using trelegy daily and does not have an albuterol  inhaler.   Reviewed CT Chest 5/6 with her and that I did not notice any significant changes from the last scan but I am waiting for radiology to read the CT scan.   OV 09/13/22 She has been doing well on trelegy ellipta  1 puff daily. She denies any mucous production or hemoptysis. She denies weight loss.   CT Chest scan shows emphysematous changes and complete left upper lobe atelectasis with air bronchograms/bronchiectasis with narrowing of the left upper lobe bronchus narrowing. Patchy areas of tree-in-bud findings along with areas of bronchiectasis and airspace nodularity. Two adjacent irregular nodules in the superior segment of the right upper lobe. No mediastinal adenopathy. There is a large hiatal hernia.   Initial OV 02/12/22 Last seen in 2014 by Dr. Darlean. PFTs in 2014 showed mild obstructive defect. 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   She is currently on wixela inhub 100-60mcg. She experience mainly exertional shortness of breath. Denies cough, fevers or chills. She may have sweats from time to time. She denies weight loss. She has chronic sinus congestion and post-nasal drainage. She is using as needed flonase . Denies GERD.  She lives alone. She quit smoking in the 80s. She is a retired Runner, broadcasting/film/video.   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   removed right upper lip/perinostril Adrien Blake ([facility])   Chronic sinusitis    follows with ENT for same   CKD (chronic kidney disease) 2018   COPD (chronic obstructive pulmonary disease) (HCC) dx 2013   GOLD 1, follows with pulm for same   Episodic tension type headache    EXOGENOUS OBESITY    Family history of adverse reaction to anesthesia    sister PONV   GERD    Heart murmur    as a baby   HYPERLIPIDEMIA    Hypertension    Migraines 07/10/2012   over; last one was @ age 57   OSTEOPENIA    Pneumonia 2018-2019   x 2   Pre-diabetes      Family History  Problem Relation Age of Onset   Prostate cancer Father    Cancer Father        prostate   Cancer Mother        waldenstruns microgobular anemia   Hyperparathyroidism Mother    Alcohol abuse Other    Heart disease Other    Hypertension Other    Diabetes Other    Pancreatic cancer Other    Hyperparathyroidism Sister    Cancer Maternal Grandfather        pancreatic     Social History   Socioeconomic History   Marital status: Single    Spouse name: Not on file   Number of children: 0   Years of education: 73  Highest education level: Bachelor's degree (e.g., BA, AB, BS)  Occupational History    Comment: retired Runner, broadcasting/film/video, 5th grade  Tobacco Use   Smoking status: Former    Current packs/day: 0.00    Average packs/day: 0.8 packs/day for 20.0 years (15.0 ttl pk-yrs)    Types: Cigarettes    Start date: 07/25/1962    Quit date: 07/25/1982    Years since quitting: 42.0   Smokeless tobacco: Never   Tobacco comments:    07/10/2012 stopped smoking cigarettes 1980's  Vaping Use   Vaping status: Never Used  Substance and Sexual Activity   Alcohol use: Yes    Alcohol/week: 1.0 standard drink of alcohol    Types: 1 Glasses of wine per week    Comment: rare   Drug use: No   Sexual activity: Never  Other Topics Concern   Not on file  Social History Narrative   Retired Runner, broadcasting/film/video,  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   Caffeine- coffee 1 cup, some Coke   Social Drivers of Corporate investment banker Strain: Low Risk  (07/24/2024)   Overall Financial Resource Strain (CARDIA)    Difficulty of Paying Living Expenses: Not hard at all  Food Insecurity: No Food Insecurity (07/24/2024)   Hunger Vital Sign    Worried About Running Out of Food in the Last Year: Never true    Ran Out of Food in the Last Year: Never true  Transportation Needs: No Transportation Needs (07/24/2024)   PRAPARE - Administrator, Civil Service (Medical): No    Lack of Transportation (Non-Medical): No  Physical Activity: Inactive (07/24/2024)   Exercise Vital Sign    Days of Exercise per Week: 0 days    Minutes of Exercise per Session: Not on file  Stress: No Stress Concern Present (07/24/2024)   [employer] of Occupational Health - Occupational Stress Questionnaire    Feeling of Stress: Only a little  Social Connections: Moderately Isolated (07/24/2024)   Social Connection and Isolation Panel    Frequency of Communication with Friends and Family: Once a week    Frequency of Social Gatherings with Friends and Family: Once a week    Attends Religious Services: More than 4 times per year    Active Member of [employer] or Organizations: Yes    Attends Banker Meetings: 1 to 4 times per year    Marital Status: Never married  Intimate Partner Violence: Not At Risk (05/03/2024)   Humiliation, Afraid, Rape, and Kick questionnaire    Fear of Current or Ex-Partner: No    Emotionally Abused: No    Physically Abused: No    Sexually Abused: No     Allergies  Allergen Reactions   Allegra [Fexofenadine Hcl] Other (See Comments)    Headache, took the pill; I got flu symptoms   Prilosec [Omeprazole] Other (See Comments)    Fecal incontinence   Ranitidine  Other (See Comments)    Fecal incontinence   Tape Itching and Other (See Comments)    Band-Aids = Skin becomes red and itchy      Outpatient Medications Prior to Visit  Medication Sig Dispense Refill   albuterol  (VENTOLIN  HFA) 108 (90 Base) MCG/ACT inhaler Inhale 2 puffs into the lungs every 6 (six) hours as needed for wheezing or shortness of breath. 8 g 6   amLODipine  (NORVASC ) 5 MG tablet TAKE 1 TABLET BY MOUTH EVERY DAY 90 tablet 0   aspirin -acetaminophen -caffeine (EXCEDRIN MIGRAINE) 250-250-65 MG tablet Take 2 tablets by  mouth every 6 (six) hours as needed for headache.     atorvastatin  (LIPITOR) 20 MG tablet TAKE 1 TABLET BY MOUTH EVERY DAY 90 tablet 3   Cranberry 500 MG CAPS Take 1,000 mg by mouth daily.     cycloSPORINE  (RESTASIS ) 0.05 % ophthalmic emulsion Place 1 drop into both eyes in the morning and at bedtime.     denosumab  (PROLIA ) 60 MG/ML SOSY injection Inject 60 mg into the skin every 6 (six) months.     esomeprazole  (NEXIUM ) 40 MG capsule TAKE 1 CAPSULE BY MOUTH DAILY AT 12 NOON. 90 capsule 1   famotidine  (PEPCID ) 40 MG tablet TAKE 1 TABLET BY MOUTH EVERY DAY 90 tablet 2   Flaxseed, Linseed, (FLAXSEED OIL PO) Take 1 capsule by mouth in the morning and at bedtime.     latanoprost (XALATAN) 0.005 % ophthalmic solution Place 1 drop into both eyes at bedtime.     nadolol  (CORGARD ) 40 MG tablet Take 1 tablet (40 mg total) by mouth 2 (two) times daily. 180 tablet 1   TRELEGY ELLIPTA  100-62.5-25 MCG/ACT AEPB INHALE 1 PUFF INTO THE LUNGS EVERY DAY 120 each 5   valsartan  (DIOVAN ) 160 MG tablet Take 160 mg by mouth daily.     Wheat Dextrin (BENEFIBER) POWD Take 1 Dose by mouth 2 (two) times daily. 1 dose = 2 teaspoons     No facility-administered medications prior to visit.    Review of Systems  Constitutional:  Negative for chills, fever, malaise/fatigue and weight loss.  HENT:  Negative for congestion, sinus pain and sore throat.   Eyes: Negative.   Respiratory:  Positive for shortness of breath. Negative for cough, hemoptysis, sputum production and wheezing.   Cardiovascular:  Negative for chest pain,  palpitations, orthopnea, claudication and leg swelling.  Gastrointestinal:  Negative for abdominal pain, heartburn, nausea and vomiting.  Genitourinary: Negative.   Musculoskeletal:  Negative for joint pain and myalgias.  Skin:  Negative for rash.  Neurological:  Negative for weakness.  Endo/Heme/Allergies: Negative.   Psychiatric/Behavioral: Negative.     Objective:   Vitals:   08/14/24 1451  BP: (!) 181/101  Pulse: 62  SpO2: 92%  Weight: 164 lb (74.4 kg)  Height: 5' 1 (1.549 m)    Physical Exam Constitutional:      General: She is not in acute distress.    Appearance: She is not ill-appearing.  HENT:     Head: Normocephalic and atraumatic.  Eyes:     Conjunctiva/sclera: Conjunctivae normal.  Cardiovascular:     Rate and Rhythm: Normal rate and regular rhythm.     Pulses: Normal pulses.     Heart sounds: Normal heart sounds. No murmur heard. Pulmonary:     Effort: Pulmonary effort is normal.     Breath sounds: Normal breath sounds. Decreased air movement present. No wheezing, rhonchi or rales.  Musculoskeletal:     Right lower leg: No edema.     Left lower leg: No edema.  Skin:    General: Skin is warm and dry.  Neurological:     General: No focal deficit present.     Mental Status: She is alert.    CBC    Component Value Date/Time   WBC 8.0 07/27/2024 1006   RBC 4.47 07/27/2024 1006   HGB 13.6 07/27/2024 1006   HCT 42.7 07/27/2024 1006   PLT 274.0 07/27/2024 1006   MCV 95.7 07/27/2024 1006   MCH 26.8 10/03/2023 1112   MCHC 31.8 07/27/2024 1006  RDW 15.4 07/27/2024 1006   LYMPHSABS 0.8 07/27/2024 1006   MONOABS 0.5 07/27/2024 1006   EOSABS 0.3 07/27/2024 1006   BASOSABS 0.1 07/27/2024 1006      Latest Ref Rng & Units 07/27/2024   10:06 AM 01/25/2024    1:41 PM 10/03/2023   11:12 AM  BMP  Glucose 70 - 99 mg/dL 89  97  896   BUN 6 - 23 mg/dL 24  21  23    Creatinine 0.40 - 1.20 mg/dL 8.76  8.77  8.75   Sodium 135 - 145 mEq/L 143  141  139    Potassium 3.5 - 5.1 mEq/L 3.9  4.0  5.1   Chloride 96 - 112 mEq/L 107  105  108   CO2 19 - 32 mEq/L 29  28  24    Calcium  8.4 - 10.5 mg/dL 9.6  9.6  89.0    Chest imaging: CT Chest 09/08/22 1. Underlying emphysematous changes and areas of pulmonary scarring. 2. Complete left upper lobe atelectasis with air bronchograms/bronchiectasis. The left upper lobe bronchus is severely narrowed and irregular. Findings likely due to chronic infection. I do not see an obstructing mass. 3. Patchy areas of tree-in-bud findings along with areas of bronchiectasis and airspace nodularity most likely due to chronic inflammation or atypical infection such as MAC. 4. Two adjacent irregular nodules in the superior segment of the right lower lobe are most likely part of the same process as there is surrounding inflammatory/interstitial changes. 5. No mediastinal or hilar mass or adenopathy. 6. Recommend pulmonary consultation, appropriate treatment and follow-up noncontrast chest CT in 3-6 months. 7. Large hiatal hernia.  CXR 12/03/21 Similar appearance of the chest x-ray to the prior, with advanced emphysema and changes of lungs which most likely represent chronic atypical infection such as MAC based on the prior CT. No definite evidence of new superimposed airspace disease.   Hiatal hernia  PFT:     No data to display         Labs:  Path:  Echo 11/26/21: LV EF 60-65%. Grade I diastolic dysfunction. RV size and systolic function are normal. LA moderately dilated.   Heart Catheterization:  Assessment & Plan:   No diagnosis found.  Discussion: Joy Martin is an 83 year old woman, former smoker with CKD, GERD, and hypertension who returns to pulmonary clinic for COPD.   She has mild obstructive defect based on PFTs from 2014. She is to continue trelelgy ellipta 1 puff daily. Will send in as needed albuterol  inhaler.  No concern for symptomatic MAC infection at this time. CT Chest  appears stable based on my review. Will wait for final radiology read and let her know the results.  Follow up in 1 year.  Dorn Chill, MD [facility] Office: [phone]   Current Outpatient Medications:    albuterol  (VENTOLIN  HFA) 108 (90 Base) MCG/ACT inhaler, Inhale 2 puffs into the lungs every 6 (six) hours as needed for wheezing or shortness of breath., Disp: 8 g, Rfl: 6   amLODipine  (NORVASC ) 5 MG tablet, TAKE 1 TABLET BY MOUTH EVERY DAY, Disp: 90 tablet, Rfl: 0   aspirin -acetaminophen -caffeine (EXCEDRIN MIGRAINE) 250-250-65 MG tablet, Take 2 tablets by mouth every 6 (six) hours as needed for headache., Disp: , Rfl:    atorvastatin  (LIPITOR) 20 MG tablet, TAKE 1 TABLET BY MOUTH EVERY DAY, Disp: 90 tablet, Rfl: 3   Cranberry 500 MG CAPS, Take 1,000 mg by mouth daily., Disp: , Rfl:  cycloSPORINE  (RESTASIS ) 0.05 % ophthalmic emulsion, Place 1 drop into both eyes in the morning and at bedtime., Disp: , Rfl:    denosumab  (PROLIA ) 60 MG/ML SOSY injection, Inject 60 mg into the skin every 6 (six) months., Disp: , Rfl:    esomeprazole  (NEXIUM ) 40 MG capsule, TAKE 1 CAPSULE BY MOUTH DAILY AT 12 NOON., Disp: 90 capsule, Rfl: 1   famotidine  (PEPCID ) 40 MG tablet, TAKE 1 TABLET BY MOUTH EVERY DAY, Disp: 90 tablet, Rfl: 2   Flaxseed, Linseed, (FLAXSEED OIL PO), Take 1 capsule by mouth in the morning and at bedtime., Disp: , Rfl:    latanoprost (XALATAN) 0.005 % ophthalmic solution, Place 1 drop into both eyes at bedtime., Disp: , Rfl:    nadolol  (CORGARD ) 40 MG tablet, Take 1 tablet (40 mg total) by mouth 2 (two) times daily., Disp: 180 tablet, Rfl: 1   TRELEGY ELLIPTA  100-62.5-25 MCG/ACT AEPB, INHALE 1 PUFF INTO THE LUNGS EVERY DAY, Disp: 120 each, Rfl: 5   valsartan  (DIOVAN ) 160 MG tablet, Take 160 mg by mouth daily., Disp: , Rfl:    Wheat Dextrin (BENEFIBER) POWD, Take 1 Dose by mouth 2 (two) times daily. 1 dose = 2 teaspoons, Disp: , Rfl:

## 2024-08-14 NOTE — Patient Instructions (Addendum)
 Continue trelegy ellipta  1 puff daily - rinse mouth out after each use  Use albuterol  inhaler 1-2 puffs every 4-6 hours as needed for shortness of breath  Follow up in 1 year

## 2024-08-16 ENCOUNTER — Encounter: Payer: Self-pay | Admitting: Pulmonary Disease

## 2024-09-10 ENCOUNTER — Encounter: Payer: Self-pay | Admitting: Internal Medicine

## 2024-09-10 ENCOUNTER — Other Ambulatory Visit: Payer: Self-pay

## 2024-09-10 MED ORDER — ESOMEPRAZOLE MAGNESIUM 40 MG PO CPDR
40.0000 mg | DELAYED_RELEASE_CAPSULE | Freq: Every day | ORAL | 1 refills | Status: AC
Start: 2024-09-10 — End: ?

## 2024-09-12 ENCOUNTER — Encounter: Payer: Self-pay | Admitting: Internal Medicine

## 2024-09-12 ENCOUNTER — Ambulatory Visit: Admitting: Internal Medicine

## 2024-09-12 VITALS — BP 146/80 | HR 92 | Temp 98.3°F | Ht 61.0 in | Wt 162.0 lb

## 2024-09-12 DIAGNOSIS — I10 Essential (primary) hypertension: Secondary | ICD-10-CM | POA: Diagnosis not present

## 2024-09-12 MED ORDER — VALSARTAN 320 MG PO TABS: 320.0000 mg | ORAL_TABLET | Freq: Every day | ORAL | 3 refills | Status: AC

## 2024-09-12 MED ORDER — FAMOTIDINE 40 MG PO TABS: 40.0000 mg | ORAL_TABLET | Freq: Every day | ORAL | 2 refills | Status: AC

## 2024-09-12 MED ORDER — BENZONATATE 100 MG PO CAPS: 100.0000 mg | ORAL_CAPSULE | Freq: Three times a day (TID) | ORAL | 0 refills | Status: DC | PRN

## 2024-09-12 NOTE — Patient Instructions (Addendum)
      Blood work was ordered.   Have this done in 1-2 weeks    Medications changes include :   increase valsartan  to 320 mg daily, tessalon  perles    Monitor your BP at home      Return for follow up as scheduled.

## 2024-09-12 NOTE — Assessment & Plan Note (Addendum)
 Chronic Blood pressure not controlled Continue nadolol  40 mg twice daily Increase valsartan  to 320 mg daily Continue to monitor BP BMP in 1-2 weeks Should be having follow-up with nephrology soon Discussed if blood work is not good her blood pressure is not well-controlled can consider restarting low-dose amlodipine , but this was causing some ankle edema

## 2024-09-12 NOTE — Progress Notes (Signed)
 0   Subjective:    Patient ID: Joy Martin, female    DOB: Feb 14, 1941, 83 y.o.   MRN: [id number]      HPI Airelle is here for  Chief Complaint  Patient presents with   Hypertension    When she saw Dr Kara her BP was elelvated - SBP was 204.  A friend who is a engineer, civil (consulting) took it later that day and it was in the 190's.    She does not check her blood pressure at home regularly on her own.  She does have shortness of breath, but it is chronic and unchanged.  She has occasional headaches and lightheadedness, but is not sure if it is related to the blood pressure or not-these are not new symptoms.  She has not had any chest pain, palpitations or leg swelling.  She denies recent changes in medication or supplements.  Last time she saw her nephrologist her amlodipine  was discontinued and she was started on valsartan .   Medications and allergies reviewed with patient and updated if appropriate.  Current Outpatient Medications on File Prior to Visit  Medication Sig Dispense Refill   albuterol  (VENTOLIN  HFA) 108 (90 Base) MCG/ACT inhaler Inhale 2 puffs into the lungs every 6 (six) hours as needed for wheezing or shortness of breath. 8 g 6   amLODipine  (NORVASC ) 5 MG tablet TAKE 1 TABLET BY MOUTH EVERY DAY 90 tablet 0   aspirin -acetaminophen -caffeine (EXCEDRIN MIGRAINE) 250-250-65 MG tablet Take 2 tablets by mouth every 6 (six) hours as needed for headache.     atorvastatin  (LIPITOR) 20 MG tablet TAKE 1 TABLET BY MOUTH EVERY DAY 90 tablet 3   Cranberry 500 MG CAPS Take 1,000 mg by mouth daily.     cycloSPORINE  (RESTASIS ) 0.05 % ophthalmic emulsion Place 1 drop into both eyes in the morning and at bedtime.     denosumab  (PROLIA ) 60 MG/ML SOSY injection Inject 60 mg into the skin every 6 (six) months.     esomeprazole  (NEXIUM ) 40 MG capsule Take 1 capsule (40 mg total) by mouth daily at 12 noon. 90 capsule 1   Flaxseed, Linseed, (FLAXSEED OIL PO) Take 1 capsule by mouth in the morning and at  bedtime.     Fluticasone -Umeclidin-Vilant (TRELEGY ELLIPTA ) 100-62.5-25 MCG/ACT AEPB Inhale 1 puff into the lungs daily. 120 each 5   latanoprost (XALATAN) 0.005 % ophthalmic solution Place 1 drop into both eyes at bedtime.     nadolol  (CORGARD ) 40 MG tablet Take 1 tablet (40 mg total) by mouth 2 (two) times daily. 180 tablet 1   valsartan  (DIOVAN ) 160 MG tablet Take 160 mg by mouth daily.     Wheat Dextrin (BENEFIBER) POWD Take 1 Dose by mouth 2 (two) times daily. 1 dose = 2 teaspoons     [DISCONTINUED] Calcium  Carbonate (CALCIUM  500 PO) Take by mouth.       No current facility-administered medications on file prior to visit.    Review of Systems  Respiratory:  Positive for shortness of breath (chronic - not worse).   Cardiovascular:  Negative for chest pain, palpitations and leg swelling.  Neurological:  Positive for light-headedness (occ) and headaches (chronic - ? related to BP). Negative for dizziness.       Objective:   Vitals:   09/12/24 1547  BP: (!) 158/88  Pulse: 92  Temp: 98.3 F (36.8 C)  SpO2: 91%   BP Readings from Last 3 Encounters:  09/12/24 (!) 158/88  08/14/24 (!) 181/101  07/27/24  130/78   Wt Readings from Last 3 Encounters:  09/12/24 162 lb (73.5 kg)  08/14/24 164 lb (74.4 kg)  07/27/24 165 lb (74.8 kg)   Body mass index is 30.61 kg/m.    Physical Exam Constitutional:      General: She is not in acute distress.    Appearance: Normal appearance.  HENT:     Head: Normocephalic and atraumatic.  Eyes:     Conjunctiva/sclera: Conjunctivae normal.  Cardiovascular:     Rate and Rhythm: Normal rate and regular rhythm.     Heart sounds: Normal heart sounds.  Pulmonary:     Effort: Pulmonary effort is normal. No respiratory distress.     Breath sounds: Normal breath sounds. No wheezing.  Musculoskeletal:     Cervical back: Neck supple.     Right lower leg: No edema.     Left lower leg: No edema.  Lymphadenopathy:     Cervical: No cervical  adenopathy.  Skin:    General: Skin is warm and dry.     Findings: No rash.  Neurological:     Mental Status: She is alert. Mental status is at baseline.  Psychiatric:        Mood and Affect: Mood normal.        Behavior: Behavior normal.            Assessment & Plan:    See Problem List for Assessment and Plan of chronic medical problems.

## 2024-10-19 ENCOUNTER — Encounter: Payer: Self-pay | Admitting: Internal Medicine

## 2024-10-19 ENCOUNTER — Ambulatory Visit

## 2024-10-19 ENCOUNTER — Ambulatory Visit: Admitting: Internal Medicine

## 2024-10-19 VITALS — BP 134/80 | HR 74 | Temp 98.0°F | Ht 61.0 in | Wt 155.0 lb

## 2024-10-19 DIAGNOSIS — J441 Chronic obstructive pulmonary disease with (acute) exacerbation: Secondary | ICD-10-CM | POA: Diagnosis not present

## 2024-10-19 DIAGNOSIS — K219 Gastro-esophageal reflux disease without esophagitis: Secondary | ICD-10-CM

## 2024-10-19 DIAGNOSIS — J189 Pneumonia, unspecified organism: Secondary | ICD-10-CM

## 2024-10-19 DIAGNOSIS — I10 Essential (primary) hypertension: Secondary | ICD-10-CM

## 2024-10-19 LAB — BASIC METABOLIC PANEL WITH GFR
BUN: 17 mg/dL (ref 6–23)
CO2: 30 meq/L (ref 19–32)
Calcium: 9.5 mg/dL (ref 8.4–10.5)
Chloride: 101 meq/L (ref 96–112)
Creatinine, Ser: 1.23 mg/dL — ABNORMAL HIGH (ref 0.40–1.20)
GFR: 40.03 mL/min — ABNORMAL LOW (ref >60.00)
Glucose, Bld: 136 mg/dL — ABNORMAL HIGH (ref 70–99)
Potassium: 3.4 meq/L — ABNORMAL LOW (ref 3.5–5.1)
Sodium: 143 meq/L (ref 135–145)

## 2024-10-19 LAB — CBC
HCT: 39.2 % (ref 36.0–46.0)
Hemoglobin: 12.9 g/dL (ref 12.0–15.0)
MCHC: 32.9 g/dL (ref 30.0–36.0)
MCV: 93.6 fl (ref 78.0–100.0)
Platelets: 415 K/uL — ABNORMAL HIGH (ref 150.0–400.0)
RBC: 4.19 Mil/uL (ref 3.87–5.11)
RDW: 14 % (ref 11.5–15.5)
WBC: 12.7 K/uL — ABNORMAL HIGH (ref 4.0–10.5)

## 2024-10-19 MED ORDER — AMOXICILLIN-POT CLAVULANATE 875-125 MG PO TABS: 1.0000 | ORAL_TABLET | Freq: Two times a day (BID) | ORAL | 0 refills | Status: AC

## 2024-10-19 MED ORDER — BENZONATATE 100 MG PO CAPS: 100.0000 mg | ORAL_CAPSULE | Freq: Three times a day (TID) | ORAL | 0 refills | Status: AC | PRN

## 2024-10-19 NOTE — Progress Notes (Unsigned)
 Subjective:    Patient ID: Joy Martin, female    DOB: 04-Aug-1941, 83 y.o.   MRN: [id number]      HPI Joy Martin is here for  Chief Complaint  Patient presents with   Dizziness    Dizziness, fatigued, weakness, hard to catch her breath, coughing   Cough - tessalon  perles work- drowsiness.    Dizz- light - worse today  Abd pain, nuasea - worse after eating   Excedrin prn - lefss ofte  Using albuterol  prn - ? Helps Using trelegy daily  Medications and allergies reviewed with patient and updated if appropriate.  Medications Ordered Prior to Encounter[1]  Review of Systems  Constitutional:  Positive for appetite change and fatigue. Negative for chills and fever.       Gen weakness  HENT:  Positive for congestion and rhinorrhea. Negative for ear pain, sinus pressure, sinus pain and sore throat.   Respiratory:  Positive for cough (productive) and shortness of breath. Negative for chest tightness and wheezing.   Cardiovascular:  Negative for chest pain.  Gastrointestinal:  Positive for abdominal pain, constipation (mild - chronic) and nausea. Negative for blood in stool (no melena), diarrhea and vomiting.  Neurological:  Positive for dizziness (combination of dizziness / lightheadedness), light-headedness (combination of dizziness / lightheadedness) and headaches.       Objective:   Vitals:   10/19/24 1532  BP: (!) 144/90  Pulse: 74  Temp: 98 F (36.7 C)  SpO2: 93%   BP Readings from Last 3 Encounters:  10/19/24 (!) 144/90  09/12/24 (!) 146/80  08/14/24 (!) 181/101   Wt Readings from Last 3 Encounters:  10/19/24 155 lb (70.3 kg)  09/12/24 162 lb (73.5 kg)  08/14/24 164 lb (74.4 kg)   Body mass index is 29.29 kg/m.    Physical Exam         Assessment & Plan:    See Problem List for Assessment and Plan of chronic medical problems.         [1]  Current Outpatient Medications on File Prior to Visit  Medication Sig Dispense Refill    albuterol  (VENTOLIN  HFA) 108 (90 Base) MCG/ACT inhaler Inhale 2 puffs into the lungs every 6 (six) hours as needed for wheezing or shortness of breath. 8 g 6   aspirin -acetaminophen -caffeine (EXCEDRIN MIGRAINE) 250-250-65 MG tablet Take 2 tablets by mouth every 6 (six) hours as needed for headache.     atorvastatin  (LIPITOR) 20 MG tablet TAKE 1 TABLET BY MOUTH EVERY DAY 90 tablet 3   benzonatate  (TESSALON ) 100 MG capsule Take 1 capsule (100 mg total) by mouth 3 (three) times daily as needed for cough. 30 capsule 0   Cranberry 500 MG CAPS Take 1,000 mg by mouth daily.     cycloSPORINE  (RESTASIS ) 0.05 % ophthalmic emulsion Place 1 drop into both eyes in the morning and at bedtime.     denosumab  (PROLIA ) 60 MG/ML SOSY injection Inject 60 mg into the skin every 6 (six) months.     esomeprazole  (NEXIUM ) 40 MG capsule Take 1 capsule (40 mg total) by mouth daily at 12 noon. 90 capsule 1   famotidine  (PEPCID ) 40 MG tablet Take 1 tablet (40 mg total) by mouth daily. 90 tablet 2   Flaxseed, Linseed, (FLAXSEED OIL PO) Take 1 capsule by mouth in the morning and at bedtime.     Fluticasone -Umeclidin-Vilant (TRELEGY ELLIPTA ) 100-62.5-25 MCG/ACT AEPB Inhale 1 puff into the lungs daily. 120 each 5   latanoprost (XALATAN)  0.005 % ophthalmic solution Place 1 drop into both eyes at bedtime.     nadolol  (CORGARD ) 40 MG tablet Take 1 tablet (40 mg total) by mouth 2 (two) times daily. 180 tablet 1   valsartan  (DIOVAN ) 320 MG tablet Take 1 tablet (320 mg total) by mouth daily. 90 tablet 3   Wheat Dextrin (BENEFIBER) POWD Take 1 Dose by mouth 2 (two) times daily. 1 dose = 2 teaspoons     [DISCONTINUED] Calcium  Carbonate (CALCIUM  500 PO) Take by mouth.       No current facility-administered medications on file prior to visit.

## 2024-10-19 NOTE — Patient Instructions (Addendum)
° ° ° ° °  Blood work was ordered.    A chest xray was ordered.    Medications changes include :   start taking nexium  daily and pepcid  nightly.  Augmentin , tessalon  perles       Return if symptoms worsen or fail to improve.

## 2024-10-20 ENCOUNTER — Ambulatory Visit: Payer: Self-pay | Admitting: Internal Medicine

## 2024-10-20 DIAGNOSIS — R918 Other nonspecific abnormal finding of lung field: Secondary | ICD-10-CM

## 2024-10-20 NOTE — Assessment & Plan Note (Deleted)
 Chronic Blood pressure not controlled Continue nadolol  40 mg twice daily Increase valsartan  to 320 mg daily Continue to monitor BP BMP in 1-2 weeks Should be having follow-up with nephrology soon Discussed if blood work is not good her blood pressure is not well-controlled can consider restarting low-dose amlodipine , but this was causing some ankle edema

## 2024-11-20 ENCOUNTER — Ambulatory Visit
Admission: RE | Admit: 2024-11-20 | Discharge: 2024-11-20 | Disposition: A | Source: Ambulatory Visit | Attending: Internal Medicine

## 2024-11-20 DIAGNOSIS — R918 Other nonspecific abnormal finding of lung field: Secondary | ICD-10-CM

## 2024-11-29 ENCOUNTER — Ambulatory Visit: Payer: Self-pay | Admitting: Internal Medicine

## 2025-01-30 ENCOUNTER — Encounter: Admitting: Internal Medicine

## 2025-05-06 ENCOUNTER — Ambulatory Visit
